# Patient Record
Sex: Female | Born: 1980 | Race: Black or African American | Hispanic: No | Marital: Single | Smoking: Never smoker
Health system: Southern US, Community
[De-identification: ages and names within clinical notes are randomized; demographics above are authoritative.]

## PROBLEM LIST (undated history)

## (undated) DIAGNOSIS — F329 Major depressive disorder, single episode, unspecified: Secondary | ICD-10-CM

## (undated) DIAGNOSIS — I10 Essential (primary) hypertension: Secondary | ICD-10-CM

## (undated) DIAGNOSIS — E119 Type 2 diabetes mellitus without complications: Secondary | ICD-10-CM

## (undated) DIAGNOSIS — K219 Gastro-esophageal reflux disease without esophagitis: Secondary | ICD-10-CM

## (undated) DIAGNOSIS — D649 Anemia, unspecified: Secondary | ICD-10-CM

## (undated) DIAGNOSIS — R112 Nausea with vomiting, unspecified: Secondary | ICD-10-CM

## (undated) DIAGNOSIS — F32A Depression, unspecified: Secondary | ICD-10-CM

## (undated) DIAGNOSIS — Z9889 Other specified postprocedural states: Secondary | ICD-10-CM

## (undated) DIAGNOSIS — Z86018 Personal history of other benign neoplasm: Secondary | ICD-10-CM

## (undated) DIAGNOSIS — J45909 Unspecified asthma, uncomplicated: Secondary | ICD-10-CM

## (undated) DIAGNOSIS — R011 Cardiac murmur, unspecified: Secondary | ICD-10-CM

## (undated) DIAGNOSIS — Z8719 Personal history of other diseases of the digestive system: Secondary | ICD-10-CM

## (undated) DIAGNOSIS — T8859XA Other complications of anesthesia, initial encounter: Secondary | ICD-10-CM

## (undated) DIAGNOSIS — T4145XA Adverse effect of unspecified anesthetic, initial encounter: Secondary | ICD-10-CM

## (undated) DIAGNOSIS — K760 Fatty (change of) liver, not elsewhere classified: Secondary | ICD-10-CM

## (undated) DIAGNOSIS — G43909 Migraine, unspecified, not intractable, without status migrainosus: Secondary | ICD-10-CM

## (undated) DIAGNOSIS — K589 Irritable bowel syndrome without diarrhea: Secondary | ICD-10-CM

## (undated) HISTORY — DX: Type 2 diabetes mellitus without complications: E11.9

## (undated) HISTORY — DX: Gastro-esophageal reflux disease without esophagitis: K21.9

## (undated) HISTORY — DX: Anemia, unspecified: D64.9

## (undated) HISTORY — DX: Essential (primary) hypertension: I10

## (undated) HISTORY — DX: Migraine, unspecified, not intractable, without status migrainosus: G43.909

## (undated) HISTORY — DX: Personal history of other benign neoplasm: Z86.018

## (undated) HISTORY — DX: Personal history of other benign neoplasm: Z98.890

## (undated) HISTORY — DX: Major depressive disorder, single episode, unspecified: F32.9

## (undated) HISTORY — DX: Depression, unspecified: F32.A

## (undated) HISTORY — DX: Irritable bowel syndrome, unspecified: K58.9

---

## 2003-11-25 HISTORY — PX: CHOLECYSTECTOMY: SHX55

## 2004-08-29 ENCOUNTER — Ambulatory Visit: Payer: Self-pay | Admitting: Surgery

## 2004-11-24 HISTORY — PX: TUMOR REMOVAL: SHX12

## 2005-01-29 ENCOUNTER — Emergency Department: Payer: Self-pay | Admitting: Emergency Medicine

## 2005-01-30 ENCOUNTER — Other Ambulatory Visit: Payer: Self-pay

## 2005-02-14 ENCOUNTER — Ambulatory Visit: Payer: Self-pay | Admitting: Gastroenterology

## 2005-04-07 ENCOUNTER — Inpatient Hospital Stay: Payer: Self-pay | Admitting: Obstetrics and Gynecology

## 2006-05-14 ENCOUNTER — Ambulatory Visit: Payer: Self-pay | Admitting: Internal Medicine

## 2008-10-25 ENCOUNTER — Ambulatory Visit: Payer: Self-pay | Admitting: Internal Medicine

## 2009-05-21 ENCOUNTER — Ambulatory Visit: Payer: Self-pay | Admitting: Gastroenterology

## 2011-02-20 ENCOUNTER — Ambulatory Visit: Payer: Self-pay | Admitting: Medical

## 2011-02-23 ENCOUNTER — Ambulatory Visit: Payer: Self-pay | Admitting: Medical

## 2011-04-03 ENCOUNTER — Ambulatory Visit: Payer: Self-pay | Admitting: Medical

## 2011-04-09 ENCOUNTER — Ambulatory Visit: Payer: Self-pay | Admitting: Internal Medicine

## 2011-04-25 ENCOUNTER — Ambulatory Visit: Payer: Self-pay | Admitting: Medical

## 2011-12-09 ENCOUNTER — Ambulatory Visit: Payer: Self-pay | Admitting: Otolaryngology

## 2011-12-09 LAB — CREATININE, SERUM
Creatinine: 0.81 mg/dL (ref 0.60–1.30)
EGFR (African American): >60
EGFR (Non-African Amer.): >60

## 2011-12-09 LAB — HCG, QUANTITATIVE, PREGNANCY: Beta Hcg, Quant.: <1 m[IU]/mL — ABNORMAL LOW

## 2012-07-20 LAB — HM DIABETES EYE EXAM

## 2012-08-26 ENCOUNTER — Telehealth: Payer: Self-pay | Admitting: Internal Medicine

## 2012-08-26 ENCOUNTER — Emergency Department: Payer: Self-pay | Admitting: Emergency Medicine

## 2012-08-26 LAB — COMPREHENSIVE METABOLIC PANEL
Albumin: 3.9 g/dL (ref 3.4–5.0)
Alkaline Phosphatase: 124 U/L (ref 50–136)
Anion Gap: 9 (ref 7–16)
BUN: 14 mg/dL (ref 7–18)
Bilirubin,Total: 0.4 mg/dL (ref 0.2–1.0)
Calcium, Total: 9.4 mg/dL (ref 8.5–10.1)
Chloride: 104 mmol/L (ref 98–107)
Co2: 26 mmol/L (ref 21–32)
Creatinine: 1.1 mg/dL (ref 0.60–1.30)
EGFR (African American): >60
EGFR (Non-African Amer.): >60
Glucose: 149 mg/dL — ABNORMAL HIGH (ref 65–99)
Osmolality: 281 (ref 275–301)
Potassium: 3.8 mmol/L (ref 3.5–5.1)
SGOT(AST): 24 U/L (ref 15–37)
SGPT (ALT): 33 U/L (ref 12–78)
Sodium: 139 mmol/L (ref 136–145)
Total Protein: 8.1 g/dL (ref 6.4–8.2)

## 2012-08-26 LAB — URINALYSIS, COMPLETE
Bacteria: NONE SEEN
Bilirubin,UR: NEGATIVE
Blood: NEGATIVE
Glucose,UR: NEGATIVE mg/dL (ref 0–75)
Ketone: NEGATIVE
Leukocyte Esterase: NEGATIVE
Nitrite: NEGATIVE
Ph: 6 (ref 4.5–8.0)
Protein: NEGATIVE
RBC,UR: NONE SEEN /HPF (ref 0–5)
Specific Gravity: 1.013 (ref 1.003–1.030)
Squamous Epithelial: 2
WBC UR: 2 /HPF (ref 0–5)

## 2012-08-26 LAB — CBC
HCT: 41.4 % (ref 35.0–47.0)
HGB: 13.2 g/dL (ref 12.0–16.0)
MCH: 22.6 pg — ABNORMAL LOW (ref 26.0–34.0)
MCHC: 31.9 g/dL — ABNORMAL LOW (ref 32.0–36.0)
MCV: 71 fL — ABNORMAL LOW (ref 80–100)
Platelet: 268 10*3/uL (ref 150–440)
RBC: 5.84 10*6/uL — ABNORMAL HIGH (ref 3.80–5.20)
RDW: 14.4 % (ref 11.5–14.5)
WBC: 7.2 10*3/uL (ref 3.6–11.0)

## 2012-08-26 LAB — PREGNANCY, URINE: Pregnancy Test, Urine: NEGATIVE m[IU]/mL

## 2012-08-26 NOTE — Telephone Encounter (Signed)
Patient informed and voiced understanding

## 2012-08-26 NOTE — Telephone Encounter (Signed)
If lightheaded and not feeling right (and with the previous issue with recent elevated bp) - rec evaluation today.  (can go to acute care of if new acute sx - to er).  Thanks.

## 2012-08-26 NOTE — Telephone Encounter (Signed)
Pt's says her face has been tingling and going numb. She went to Urgent Care on Tuesday because her B/P was really high. She says that she is still currently not feeling right very light headed. She was wondering what she should do.

## 2012-09-06 ENCOUNTER — Ambulatory Visit (INDEPENDENT_AMBULATORY_CARE_PROVIDER_SITE_OTHER): Payer: 59 | Admitting: Internal Medicine

## 2012-09-06 ENCOUNTER — Encounter: Payer: Self-pay | Admitting: Internal Medicine

## 2012-09-06 ENCOUNTER — Ambulatory Visit: Payer: Self-pay | Admitting: Internal Medicine

## 2012-09-06 VITALS — BP 122/82 | HR 82 | Temp 98.5°F | Ht 61.0 in | Wt 274.8 lb

## 2012-09-06 DIAGNOSIS — Z23 Encounter for immunization: Secondary | ICD-10-CM

## 2012-09-06 DIAGNOSIS — I10 Essential (primary) hypertension: Secondary | ICD-10-CM

## 2012-09-06 DIAGNOSIS — M25519 Pain in unspecified shoulder: Secondary | ICD-10-CM

## 2012-09-06 DIAGNOSIS — K219 Gastro-esophageal reflux disease without esophagitis: Secondary | ICD-10-CM | POA: Insufficient documentation

## 2012-09-06 DIAGNOSIS — E119 Type 2 diabetes mellitus without complications: Secondary | ICD-10-CM

## 2012-09-06 MED ORDER — SITAGLIPTIN PHOS-METFORMIN HCL 50-1000 MG PO TABS: 1.0000 | ORAL_TABLET | Freq: Every day | ORAL | Status: DC

## 2012-09-06 NOTE — Assessment & Plan Note (Signed)
Blood pressure on recheck 128/88.  A little elevated.  Have her spot check her sugars and send in for review.  Follow.  Hold on making changes in her meds at this time.

## 2012-09-06 NOTE — Assessment & Plan Note (Signed)
States her sugars have been doing well.  Obtain recent labs for review.  Same meds.  Follow.

## 2012-09-06 NOTE — Patient Instructions (Addendum)
It was good seeing you today.  I am sorry you have not been feeling well.  I will get your records to review.  Also, I am going to refer you to physical therapy for your shoulder.

## 2012-09-06 NOTE — Progress Notes (Signed)
Subjective:    Patient ID: Shirley [name], female    DOB: [birth year], 31 y.o.   MRN: [id number]  HPI 31 year old female with past history or hypertension and hypercholesterolemia who comes in today for a scheduled follow up.  She reports she had been doing relatively well up until approximately two weeks ago.  She woke and felt a little "sluggish".  Face was tingling.  A friend noticed her face was red.  Went to work for approximately one hour - felt no better.  To [facility].  Evaluated by Dr Lysle Dingwall.  Blood sugar - 125.  Hgb slightly decreased.  No other significant abnormality.  Blood pressure was a little elevated.  Remained out of work for the next two days.  Had one episode of emesis during this time.  The following day (when she woke) - she didn't feel good.  Was a little light headed.  Stayed at work for 1/2 day and started feeling better.  She walked up town (from her work) and was sitting.  Started to feel "funny" again.  Had not eaten anything since breakfast (was after 1:00).  Stomach was upset.  Ate soda and funnel cake fries.  Symptoms improved for a while and then she started feeling sob and light headed.  To ER.  Labs, xray and EKG - normal (per report).  Blood pressure was elevated initially, but down to normal prior to leaving.  States she was told she was dehydrated.  She was initially fatigued, but after a couple of days felt back to her normal self.  No symptoms or problems now.  She feels the above episode was related to starting Topamax.  She had recently started this for migraine prevention.  Has since stopped - and feels better.   Prior to the above episode, she had been having some right shoulder pain.  Hurts to raise her arm.  Was also evaluated for this at [facility].  Was given Etodolac.  Shoulder is better, but still hurts when she fully extends her arm and with certain movements.  Past Medical History  Diagnosis Date  . History of chicken pox   . Depression   . Diabetes   . Migraine   .  Hypertension     Review of Systems Patient denies any headache, lightheadedness or dizziness currently.  No headache.  No chest pain, tightness or palpitations.  No increased shortness of breath, cough or congestion.  No acid reflux.  No nausea or vomiting.  No abdominal pain or cramping.  No bowel change, such as diarrhea, constipation, BRBPR or melana.  No urine change.        Objective:   Physical Exam Filed Vitals:   09/06/12 1344  BP: 122/82  Pulse: 82  Temp: 98.5 F (87.64 C)   31 year old female in no acute distress.   HEENT:  Nares - clear.  OP- without lesions or erythema.  NECK:  Supple, nontender.  No audible carotid bruit.   HEART:  Appears to be regular. LUNGS:  Without crackles or wheezing audible.  Respirations even and unlabored.   RADIAL PULSE:  Equal bilaterally.  ABDOMEN:  Soft, nontender.  No audible abdominal bruit.   EXTREMITIES:  No increased edema to be present.              MSK.  Increased pain - shoulder - noted more with full extension of her arm over her head.        Assessment & Plan:  TINGLING (  FACE) AND SLUGGISH FEELING.  See above.  Has resolved now.  She is eating and drinking well.  No nausea or vomiting.  No headache.  Blood pressure is better.  Off Tomapax.  Will obtain records from her [facility] visit and her ER visit.  Hold on further testing at this time.  Follow.  Could have been a possible reaction from the Topamax or could have had some viral illness.  Currently asymptomatic.  Follow.  SHOULDER PAIN.  See above.  Better after taking the Etodolac.  Will try stopping the Etodolac.  Refer to PT for evaluation and treatment.  Hold on further testing at this point.

## 2012-09-06 NOTE — Assessment & Plan Note (Signed)
Symptoms controlled on Protonix.  Follow.    

## 2012-09-21 ENCOUNTER — Telehealth: Payer: Self-pay | Admitting: Internal Medicine

## 2012-09-21 NOTE — Telephone Encounter (Signed)
Pt needs new activation code for my chart

## 2012-10-07 ENCOUNTER — Encounter: Payer: Self-pay | Admitting: Internal Medicine

## 2012-10-07 ENCOUNTER — Ambulatory Visit (INDEPENDENT_AMBULATORY_CARE_PROVIDER_SITE_OTHER): Payer: 59 | Admitting: Internal Medicine

## 2012-10-07 VITALS — BP 110/70 | HR 99 | Temp 98.3°F | Ht 61.0 in | Wt 276.2 lb

## 2012-10-07 DIAGNOSIS — K219 Gastro-esophageal reflux disease without esophagitis: Secondary | ICD-10-CM

## 2012-10-07 DIAGNOSIS — E559 Vitamin D deficiency, unspecified: Secondary | ICD-10-CM

## 2012-10-07 DIAGNOSIS — I10 Essential (primary) hypertension: Secondary | ICD-10-CM

## 2012-10-07 DIAGNOSIS — E119 Type 2 diabetes mellitus without complications: Secondary | ICD-10-CM

## 2012-10-07 MED ORDER — MOMETASONE FUROATE 50 MCG/ACT NA SUSP: 2.0000 | Freq: Every day | NASAL | Status: DC | PRN

## 2012-10-07 MED ORDER — AMOXICILLIN 875 MG PO TABS: 875.0000 mg | ORAL_TABLET | Freq: Two times a day (BID) | ORAL | Status: DC

## 2012-10-07 NOTE — Patient Instructions (Signed)
It was good to see you again.  I want you to do the saline flushes and nasonex as we discussed.  I am going to give you a prescription to have if needed for an antibiotic.  Let me know if problems.  Take protonix twice a day.

## 2012-10-09 ENCOUNTER — Encounter: Payer: Self-pay | Admitting: Internal Medicine

## 2012-10-09 DIAGNOSIS — E559 Vitamin D deficiency, unspecified: Secondary | ICD-10-CM | POA: Insufficient documentation

## 2012-10-09 NOTE — Assessment & Plan Note (Signed)
Blood pressure ok.  Follow.  Same meds.  Check metabolic panel.

## 2012-10-09 NOTE — Assessment & Plan Note (Signed)
Controlled.  Follow.   

## 2012-10-09 NOTE — Progress Notes (Signed)
  Subjective:    Patient ID: Shirley [name], female    DOB: 24-Jul-1981, 31 y.o.   MRN: [id number]  HPI 31 year old female with past history of hypertension, diabetes and GERD who comes in today for a scheduled follow up.  She states she has been dong better.  Going to physical therapy.  Helped her shoulder.  Cannot afford to continue.  Instructed to do her exercises at home.  She is now having a scratchy throat and increased nasal congestion.  Yellow mucus production.  Sore throat.  No fever or cough.  No sob.  No chest pain or tightness.    Past Medical History  Diagnosis Date  . Depression   . Diabetes mellitus   . Migraine   . Hypertension   . IBS (irritable bowel syndrome)   . GERD (gastroesophageal reflux disease)   . Anemia     Review of Systems Patient denies any headache, lightheadedness or dizziness.  Sinus symptoms as outlined.   No chest pain, tightness or palpitations.  No increased shortness of breath, cough or congestion.  No nausea or vomiting.  No abdominal pain or cramping.  No increased acid reflux.  No bowel change, such as diarrhea, constipation, BRBPR or melana.  No urine change.        Objective:   Physical Exam Filed Vitals:   10/07/12 1512  BP: 110/70  Pulse: 99  Temp: 98.3 F (34.73 C)   31 year old female in no acute distress.   HEENT:  Nares - clear except slightly erythematous turbinates.  OP- without lesions or erythema.  TMs visualized - without erythema.  Minimal sinus tenderness to palpation.   NECK:  Supple, nontender.  No audible bruit.   HEART:  Appears to be regular. LUNGS:  Without crackles or wheezing audible.  Respirations even and unlabored.   RADIAL PULSE:  Equal bilaterally.  ABDOMEN:  Soft, nontender.  No audible abdominal bruit.   EXTREMITIES:  No increased edema to be present.                     Assessment & Plan:  POSSIBLE EARLY SINUSITIS.  Will restart Nasonex.  Saline nasal flushes as directed.  Robitussin as directed.  Gave her  a rx for Amoxicillin to hang on to.  Only fill if symptoms progress.  Notify me if persistent problems.    INCREASED PSYCHOSOCIAL STRESSORS.  Handling things relatively well.  Follow.    HEALTH MAINTENANCE.  Physical 03/27/12.  Breast/pap and pelvic through GYN.  Cholesterol 5/13 wnl.

## 2012-10-09 NOTE — Assessment & Plan Note (Signed)
Continue replacement.  Follow.   

## 2012-10-09 NOTE — Assessment & Plan Note (Signed)
Taking Janumet.  Follow sugars bid.  Record.  Diabetic diet and exercise.

## 2012-10-12 ENCOUNTER — Encounter: Payer: Self-pay | Admitting: Internal Medicine

## 2012-10-13 ENCOUNTER — Telehealth: Payer: Self-pay | Admitting: *Deleted

## 2012-10-13 NOTE — Telephone Encounter (Signed)
Left message for patient to return call.

## 2012-10-15 NOTE — Telephone Encounter (Signed)
Form completed and sent to [facility]

## 2012-10-22 NOTE — Telephone Encounter (Signed)
Opened by mistake.

## 2012-10-25 ENCOUNTER — Encounter: Payer: Self-pay | Admitting: Internal Medicine

## 2012-10-28 NOTE — Telephone Encounter (Signed)
Patient got this when she saw Dr. Lorin Picket on 10/07/12.

## 2012-11-09 ENCOUNTER — Telehealth: Payer: Self-pay | Admitting: Internal Medicine

## 2012-11-09 ENCOUNTER — Encounter: Payer: Self-pay | Admitting: Internal Medicine

## 2012-11-09 NOTE — Telephone Encounter (Signed)
Pt notified of labs via my chart.  

## 2012-11-09 NOTE — Telephone Encounter (Signed)
Pt states she had labs at [facility] on 09/01/12.  We have not received these.  i can't find them.  Please call [facility] and get them to send me a copy of labs.

## 2012-11-09 NOTE — Telephone Encounter (Signed)
Lab results from [facility] given to Dr.

## 2012-11-09 NOTE — Telephone Encounter (Signed)
Do you know where you put the copy of her labs.  I gave them to you and told you she had been notified.  Apparently she is needing the exact values.  I looked in the scan pile and could not find.  I may have missed them.  Pt needs values by this pm.  Thanks.  Let me know if a problem.

## 2012-11-10 NOTE — Telephone Encounter (Signed)
Received labs, called patient to let her know her values. Left message to return call.

## 2012-12-01 ENCOUNTER — Encounter: Payer: Self-pay | Admitting: Internal Medicine

## 2012-12-07 ENCOUNTER — Encounter: Payer: Self-pay | Admitting: Internal Medicine

## 2012-12-07 ENCOUNTER — Ambulatory Visit (INDEPENDENT_AMBULATORY_CARE_PROVIDER_SITE_OTHER): Payer: 59 | Admitting: Internal Medicine

## 2012-12-07 VITALS — BP 140/90 | HR 80 | Temp 98.6°F | Ht 61.0 in | Wt 276.2 lb

## 2012-12-07 DIAGNOSIS — I10 Essential (primary) hypertension: Secondary | ICD-10-CM

## 2012-12-07 DIAGNOSIS — E119 Type 2 diabetes mellitus without complications: Secondary | ICD-10-CM

## 2012-12-07 DIAGNOSIS — K219 Gastro-esophageal reflux disease without esophagitis: Secondary | ICD-10-CM

## 2012-12-12 ENCOUNTER — Encounter: Payer: Self-pay | Admitting: Internal Medicine

## 2012-12-12 NOTE — Assessment & Plan Note (Signed)
Blood pressure borderline today.  Have her spot check her pressure.  Follow.  Will need adjustment in her medication if persistent elevation.

## 2012-12-12 NOTE — Assessment & Plan Note (Signed)
Last a1c checked 6.7.  Diet and exercise.  Follow.  Weight loss.

## 2012-12-12 NOTE — Progress Notes (Signed)
  Subjective:    Patient ID: Shirley [name], female    DOB: 1981-05-20, 32 y.o.   MRN: [id number]  HPI 32 year old female with past history of hypertension, diabetes and GERD who comes in today for a scheduled follow up.  She states she has been dong better.  Physical therapy helped her shoulder.   Instructed to do her exercises at home.  States her sugar in the am has been running 130-140.  No recorded evening sugars.  Still seeing Dr Sherryll Burger for her headaches, etc.  Breathing stable.  No chest pain or tightness.     Past Medical History  Diagnosis Date  . Depression   . Diabetes mellitus   . Migraine   . Hypertension   . IBS (irritable bowel syndrome)   . GERD (gastroesophageal reflux disease)   . Anemia     Review of Systems Patient denies any headache, lightheadedness or dizziness.  No significant sinus or allergy symptoms currently.   No chest pain, tightness or palpitations.  No increased shortness of breath, cough or congestion.  No nausea or vomiting.  No abdominal pain or cramping.  No increased acid reflux.  No bowel change, such as diarrhea, constipation, BRBPR or melana.  No urine change.        Objective:   Physical Exam  Filed Vitals:   12/07/12 1516  BP: 140/90  Pulse: 80  Temp: 98.6 F (47 C)   32 year old female in no acute distress.   HEENT:  Nares - clear.   OP- without lesions or erythema.   NECK:  Supple, nontender.  No audible bruit.   HEART:  Appears to be regular. LUNGS:  Without crackles or wheezing audible.  Respirations even and unlabored.   RADIAL PULSE:  Equal bilaterally.  ABDOMEN:  Soft, nontender.  No audible abdominal bruit.   EXTREMITIES:  No increased edema to be present.                     Assessment & Plan:  INCREASED PSYCHOSOCIAL STRESSORS.  Handling things relatively well.  Follow.      HEALTH MAINTENANCE.  Physical 03/27/12.  Breast/pap and pelvic through GYN.  Cholesterol 5/13 wnl.

## 2012-12-12 NOTE — Assessment & Plan Note (Signed)
Symptoms controlled

## 2012-12-27 ENCOUNTER — Telehealth: Payer: Self-pay | Admitting: Internal Medicine

## 2012-12-27 NOTE — Telephone Encounter (Signed)
Left message for pt to call office to schedule her appointment     My chart message   Appointment Request From: Irving Copas      With Provider: Charm Barges, MD [-Primary Care Physician-]      Preferred Date Range: Any date 12/27/2012 or later      Preferred Times: Any      Reason for visit: Office Visit      Comments:   Good Morning,    I am having server right arm pains. The main source of pain is coming from the elbow area. I am unable to used it due the pain coming from the elbow. Hoping I may have appointment for today to discuss some sort of pain meds or muscle relaxer.       Thanks   Irving Copas

## 2012-12-28 NOTE — Telephone Encounter (Signed)
Just an FYI

## 2012-12-28 NOTE — Telephone Encounter (Signed)
Appointment with raqual 2/5 @ 1:30

## 2012-12-28 NOTE — Telephone Encounter (Signed)
Left message for pt to call office and also sent my chart message for pt to call office

## 2012-12-28 NOTE — Telephone Encounter (Signed)
Patient Information:  Caller Name: Sheletha  Phone: [phone]  Patient: Shirley [name], Shirley [name]  Gender: Female  DOB: 1981-08-04  Age: 32 Years  PCP: Dale Rosedale  Pregnant: No  Office Follow Up:  Does the office need to follow up with this patient?: Yes  Instructions For The Office: No available appointments this date; some are blocked.  Please follow up with patient regarding possible work in appointment today.  She is at work and can be reached at number provided; it is the number to her desk.   Symptoms  Reason For Call & Symptoms: Right arm pain primarily in elbow area.  Pain rated at 7-8 of 10.  Unsure of injury.  Worse since 12/25/12.  Numbness in hand reported.  Denies swelling.  Reviewed Health History In EMR: Yes  Reviewed Medications In EMR: Yes  Reviewed Allergies In EMR: Yes  Reviewed Surgeries / Procedures: Yes  Date of Onset of Symptoms: 12/14/2012  Treatments Tried: Tylenol, Flexeril - some relief  Treatments Tried Worked: No OB / GYN:  LMP: 11/26/2012  Guideline(s) Used:  Elbow Pain  Disposition Per Guideline:   See Today in Office  Reason For Disposition Reached:   Numbness (i.e., loss of sensation) in hand or fingers  Advice Given:  Reassurance - Elbow Pain  Causes of elbow pain can include a strained muscle, a forgotten minor injury, and tendinitis.  Here is some care advice that should help.  Pain Medicines:  For pain relief, you can take either acetaminophen, ibuprofen, or naproxen.  Acetaminophen (e.g., Tylenol):  Regular Strength Tylenol: Take 650 mg (two 325 mg pills) by mouth every 4-6 hours as needed. Each Regular Strength Tylenol pill has 325 mg of acetaminophen.  Extra Strength Tylenol: Take 1,000 mg (two 500 mg pills) every 8 hours as needed. Each Extra Strength Tylenol pill has 500 mg of acetaminophen.  The most you should take each day is 3,000 mg (10 Regular Strength or 6 Extra Strength pills a day).  Extra Notes :  Use the lowest amount of  medicine that makes your pain feel better.  Call Back If:  Swollen joint or fever occurs  You become worse.  Apply Heat to the Area:  Beginning 48 hours after an injury, apply a warm washcloth or heating pad for 10 minutes three times a day.  This will help increase blood flow and improve healing.

## 2012-12-29 ENCOUNTER — Encounter: Payer: Self-pay | Admitting: Adult Health

## 2012-12-29 ENCOUNTER — Ambulatory Visit (INDEPENDENT_AMBULATORY_CARE_PROVIDER_SITE_OTHER): Payer: 59 | Admitting: Adult Health

## 2012-12-29 VITALS — BP 130/80 | HR 100 | Temp 98.2°F | Resp 16 | Wt 278.0 lb

## 2012-12-29 DIAGNOSIS — M25529 Pain in unspecified elbow: Secondary | ICD-10-CM | POA: Insufficient documentation

## 2012-12-29 MED ORDER — TRAMADOL HCL 50 MG PO TABS: 50.0000 mg | ORAL_TABLET | Freq: Three times a day (TID) | ORAL | Status: DC | PRN

## 2012-12-29 MED ORDER — ELBOW BRACE MISC: Status: DC

## 2012-12-29 MED ORDER — CYCLOBENZAPRINE HCL 10 MG PO TABS: 10.0000 mg | ORAL_TABLET | Freq: Three times a day (TID) | ORAL | Status: DC | PRN

## 2012-12-29 NOTE — Progress Notes (Signed)
  Subjective:    Patient ID: Shirley [name], female    DOB: Mar 08, 1981, 32 y.o.   MRN: [id number]  HPI  Patient presents to clinic today with c/o pain in right elbow pain with some radiation up to the shoulder area. She first noticed it around the beginning of January while she was helping family move. She also bowls in a league and this has been difficult for her to do. She has decreased the weight of the bowling ball to less than 10 lbs but this is still bothering her. Occasional tingling sensation in the right arm. She denies coolness in extremity or change in color.   Review of Systems  Respiratory: Negative.   Cardiovascular: Negative.   Musculoskeletal:       Pain in right elbow on lateral aspect. Some radiation to shoulder  Neurological: Negative for weakness and numbness.       Occassional tingling in right arm.  Psychiatric/Behavioral: Negative.    BP 130/80  Pulse 100  Temp 98.2 F (36.8 C) (Oral)  Resp 16  Wt 278 lb (126.1 kg)  SpO2 98%  LMP 12/27/2012     Objective:   Physical Exam  Constitutional: She is oriented to person, place, and time.  Musculoskeletal: Normal range of motion. She exhibits edema and tenderness.  Neurological: She is alert and oriented to person, place, and time. Coordination normal.  Skin: Skin is warm and dry.  Psychiatric: She has a normal mood and affect. Her behavior is normal. Thought content normal.       Assessment & Plan:

## 2012-12-29 NOTE — Assessment & Plan Note (Addendum)
Pain in lateral aspect of elbow. Full ROM without any crepitus. Suspect this is related to overuse injury from recently helping someone move and lifting heavy objects. 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 Will try flexeril and tramadol. Apply ice for 20 min alternating with heat. If no improvement will refer to ortho. She should not bowl for several weeks to give this time to heal. An elbow brace may also provide some support. Ordered same.

## 2012-12-29 NOTE — Patient Instructions (Addendum)
  Apply ice alternating with heat to the area for 20 min. Do this 3-4 times a day if possible.  Take flexeril 3 times a day as needed for muscle spasms.  Take Tramadol 3 times a day for pain.  You can still take some Aleve if you need it. Do not take more than 2 daily.  Injury to the musculoskeletal system takes approximately 4-6 weeks to heel. You should start to feel some relief within this time frame.  Avoid any activity that may aggravate your discomfort. Do not bowl for several weeks.  Do not lift anything above 5 lbs with your right arm.  You might also feel some relief with an elbow support that is sold over the counter.

## 2013-01-19 ENCOUNTER — Telehealth: Payer: Self-pay | Admitting: *Deleted

## 2013-01-19 NOTE — Telephone Encounter (Signed)
Left message for patient to return call concerning her lab results.

## 2013-01-20 NOTE — Telephone Encounter (Signed)
Patient called back for lab results. Patient notified.

## 2013-02-04 ENCOUNTER — Encounter: Payer: Self-pay | Admitting: Internal Medicine

## 2013-02-04 ENCOUNTER — Ambulatory Visit (INDEPENDENT_AMBULATORY_CARE_PROVIDER_SITE_OTHER): Payer: 59 | Admitting: Internal Medicine

## 2013-02-04 VITALS — BP 124/90 | HR 88 | Temp 98.4°F | Ht 61.0 in | Wt 276.5 lb

## 2013-02-04 DIAGNOSIS — M79609 Pain in unspecified limb: Secondary | ICD-10-CM

## 2013-02-04 DIAGNOSIS — E559 Vitamin D deficiency, unspecified: Secondary | ICD-10-CM

## 2013-02-04 DIAGNOSIS — K219 Gastro-esophageal reflux disease without esophagitis: Secondary | ICD-10-CM

## 2013-02-04 DIAGNOSIS — I10 Essential (primary) hypertension: Secondary | ICD-10-CM

## 2013-02-04 DIAGNOSIS — M79601 Pain in right arm: Secondary | ICD-10-CM

## 2013-02-04 DIAGNOSIS — E119 Type 2 diabetes mellitus without complications: Secondary | ICD-10-CM

## 2013-02-04 MED ORDER — LOSARTAN POTASSIUM-HCTZ 100-12.5 MG PO TABS: 1.0000 | ORAL_TABLET | Freq: Every day | ORAL | Status: DC

## 2013-02-06 ENCOUNTER — Encounter: Payer: Self-pay | Admitting: Internal Medicine

## 2013-02-06 NOTE — Assessment & Plan Note (Signed)
Blood pressure elevated.  Will increased losartan/hctz to 100/12.5 q day.  Follow.

## 2013-02-06 NOTE — Progress Notes (Signed)
  Subjective:    Patient ID: Shirley [name], female    DOB: [birth year], 32 y.o.   MRN: [id number]  HPI 32 year old female with past history of hypertension, diabetes and GERD who comes in today for a scheduled follow up.  She states she has been doing relatively well.  Physical therapy helped her shoulder.  She has now been having increased pain in her right elbow.  Hurts to rotate her arm forward.  She did stop bowling for a while.   States her sugar has been doing better.  No recorded sugar readings. Breathing stable.  No chest pain or tightness.   Seeing GYN.  Just evaluated 2/14 and had left lower quadrant pain.  Had a pelvic ultrasound.  Obtain records.  Has follow up planned in 6/14 for a follow up ultrasound.  Still seeing Dr [place] Callas.  Receiving allergy shots and taking an antihistamine, using saline nasal spray and a rx nasal spray.  Still some allergy issues.  On protonix.  Past Medical History  Diagnosis Date  . Depression   . Diabetes mellitus   . Migraine   . Hypertension   . IBS (irritable bowel syndrome)   . GERD (gastroesophageal reflux disease)   . Anemia     Review of Systems Patient denies any headache, lightheadedness or dizziness.  Some allergy symptoms.  Question if aggravated by reflux.  Some reflux.  On protonix daily.  No chest pain, tightness or palpitations.  No increased shortness of breath, cough or congestion.  No nausea or vomiting.  No abdominal pain or cramping.  Being followed by gyn for left lower quadrant pain.  No bowel change, such as diarrhea, constipation, BRBPR or melana.  No urine change.        Objective:   Physical Exam  Filed Vitals:   02/04/13 1530  BP: 124/90  Pulse: 88  Temp: 98.4 F (84.58 C)   32 year old female in no acute distress.   HEENT:  Nares - clear.   OP- without lesions or erythema.   NECK:  Supple, nontender.  No audible bruit.   HEART:  Appears to be regular. LUNGS:  Without crackles or wheezing audible.  Respirations even  and unlabored.   RADIAL PULSE:  Equal bilaterally.  ABDOMEN:  Soft, nontender.  No audible abdominal bruit.   EXTREMITIES:  No increased edema to be present.  MSK:  Pain with palpation over the elbow.  Increased pain with rotation of her right forearm.                     Assessment & Plan:  INCREASED PSYCHOSOCIAL STRESSORS.  Handling things relatively well.  Follow.      HEALTH MAINTENANCE.  Physical 03/27/12.  Breast/pap and pelvic through GYN.  Cholesterol 5/13 wnl.

## 2013-02-06 NOTE — Assessment & Plan Note (Signed)
With persistent allergy issues and some reflux, will increase protonix to 40mg  bid.  Follow.

## 2013-02-06 NOTE — Assessment & Plan Note (Signed)
Continue supplementation.  Follow.  

## 2013-02-06 NOTE — Assessment & Plan Note (Signed)
Appears to be c/w tendonitis.  Saw Raquel.  Has been taking a muscle relaxant and pain meds.  Appears to be more c/w tendonitis.  Elbow strap.  Avoid increased antiinflammatories.  Refer to Dr Lavenia Atlas for evaluation and question of need for an injection.

## 2013-02-06 NOTE — Assessment & Plan Note (Signed)
A1c just checked 01/11/13 - 6.7.  Follow.  Diabetic diet and exercise.

## 2013-02-07 ENCOUNTER — Telehealth: Payer: Self-pay | Admitting: Emergency Medicine

## 2013-02-12 ENCOUNTER — Other Ambulatory Visit: Payer: Self-pay | Admitting: Adult Health

## 2013-03-02 ENCOUNTER — Other Ambulatory Visit: Payer: Self-pay | Admitting: General Practice

## 2013-03-02 MED ORDER — TRAMADOL HCL 50 MG PO TABS: 50.0000 mg | ORAL_TABLET | Freq: Three times a day (TID) | ORAL | Status: DC | PRN

## 2013-03-15 ENCOUNTER — Ambulatory Visit (INDEPENDENT_AMBULATORY_CARE_PROVIDER_SITE_OTHER): Payer: 59 | Admitting: Internal Medicine

## 2013-03-15 VITALS — BP 110/70 | HR 89 | Temp 98.6°F | Ht 61.0 in | Wt 268.2 lb

## 2013-03-15 DIAGNOSIS — K219 Gastro-esophageal reflux disease without esophagitis: Secondary | ICD-10-CM

## 2013-03-15 DIAGNOSIS — M25521 Pain in right elbow: Secondary | ICD-10-CM

## 2013-03-15 DIAGNOSIS — I10 Essential (primary) hypertension: Secondary | ICD-10-CM

## 2013-03-15 DIAGNOSIS — M25529 Pain in unspecified elbow: Secondary | ICD-10-CM

## 2013-03-15 DIAGNOSIS — E119 Type 2 diabetes mellitus without complications: Secondary | ICD-10-CM

## 2013-03-15 DIAGNOSIS — E559 Vitamin D deficiency, unspecified: Secondary | ICD-10-CM

## 2013-03-15 MED ORDER — CYCLOBENZAPRINE HCL 10 MG PO TABS: 10.0000 mg | ORAL_TABLET | Freq: Two times a day (BID) | ORAL | Status: DC | PRN

## 2013-03-21 ENCOUNTER — Encounter: Payer: Self-pay | Admitting: Internal Medicine

## 2013-03-21 NOTE — Assessment & Plan Note (Signed)
Doing better s/p injection.  Follow.   

## 2013-03-21 NOTE — Assessment & Plan Note (Signed)
Continue supplementation.  Follow.  

## 2013-03-21 NOTE — Progress Notes (Signed)
Subjective:    Patient ID: Shirley [name], female    DOB: 07-Feb-1981, 32 y.o.   MRN: [id number]  HPI 32 year old female with past history of hypertension, diabetes and GERD who comes in today for a scheduled follow up.  She states she has been doing relatively well.  Physical therapy helped her shoulder.  She was having increased pain in her right elbow.  Saw Dr Gavin Potters yesterday.  Had an injection.  Better today.  Still some discomfort in her right shoulder, but better than previous.  Doing her exercise.  States her sugar has been averaging 100-110 in the am and 170s in the pm.  Breathing stable.  No chest pain or tightness.   Seeing GYN.  Just evaluated 2/14 and had left lower quadrant pain.  Had a pelvic ultrasound.  Has follow up planned in 6/14 for a follow up ultrasound.  Still seeing Dr [place] Callas.  Receiving allergy shots and taking an antihistamine, using saline nasal spray and a rx nasal spray.  Still some allergy issues.  On protonix.   Past Medical History  Diagnosis Date  . Depression   . Diabetes mellitus   . Migraine   . Hypertension   . IBS (irritable bowel syndrome)   . GERD (gastroesophageal reflux disease)   . Anemia     Current Outpatient Prescriptions on File Prior to Visit  Medication Sig Dispense Refill  . azelastine (OPTIVAR) 0.05 % ophthalmic solution 1 drop 2 (two) times daily as needed.      . Cholecalciferol (VITAMIN D-3) 1000 UNITS CAPS Take 1 capsule by mouth daily.      . Elastic Bandages & Supports (ELBOW BRACE) MISC Use elbow brace during the day. Remove at bedtime.  1 each  0  . levocetirizine (XYZAL) 5 MG tablet Take 5 mg by mouth every evening.      Marland Kitchen losartan-hydrochlorothiazide (HYZAAR) 100-12.5 MG per tablet Take 1 tablet by mouth daily.  90 tablet  3  . Magnesium Oxide (MAG-OX 400 PO) Take 1 tablet by mouth daily.      . mometasone (NASONEX) 50 MCG/ACT nasal spray Place 2 sprays into the nose daily as needed.  17 g  2  . Olopatadine HCl (PATANASE) 0.6  % SOLN Place into the nose as needed.      . pantoprazole (PROTONIX) 40 MG tablet Take 40 mg by mouth daily.      Marland Kitchen pyridOXINE (VITAMIN B-6) 100 MG tablet Take 100 mg by mouth daily.      . SitaGLIPtin-MetFORMIN HCl (JANUMET XR) 50-1000 MG TB24 Take 1 tablet by mouth daily.      . SUMAtriptan (IMITREX) 50 MG tablet Take 50 mg by mouth daily as needed.      . traMADol (ULTRAM) 50 MG tablet Take 1 tablet (50 mg total) by mouth every 8 (eight) hours as needed for pain.  45 tablet  0  . zolpidem (AMBIEN) 5 MG tablet Take 5 mg by mouth at bedtime as needed.       No current facility-administered medications on file prior to visit.    Review of Systems Patient denies any headache, lightheadedness or dizziness.  Some allergy symptoms.  On protonix daily.  No chest pain, tightness or palpitations.  No increased shortness of breath, cough or congestion.  No nausea or vomiting.  No abdominal pain or cramping.  Being followed by gyn for left lower quadrant pain.  No bowel change, such as diarrhea, constipation, BRBPR or melana.  No  urine change.  Right elbow pain improved after injection yesterday.        Objective:   Physical Exam  Filed Vitals:   03/15/13 1121  BP: 110/70  Pulse: 89  Temp: 98.6 F (7 C)   32 year old female in no acute distress.   HEENT:  Nares - clear.   OP- without lesions or erythema.   NECK:  Supple, nontender.  No audible bruit.   HEART:  Appears to be regular. LUNGS:  Without crackles or wheezing audible.  Respirations even and unlabored.   RADIAL PULSE:  Equal bilaterally.  ABDOMEN:  Soft, nontender.  No audible abdominal bruit.   EXTREMITIES:  No increased edema to be present.  MSK:  Decreased pain to palpation over the right elbow.  No significant pain - right shoulder - with rotation and abduction/adduction - right arm.                     Assessment & Plan:  INCREASED PSYCHOSOCIAL STRESSORS.  Handling things relatively well.  Follow.      HEALTH MAINTENANCE.   Physical 03/27/12.  Breast/pap and pelvic through GYN.  Cholesterol 5/13 wnl.

## 2013-03-21 NOTE — Assessment & Plan Note (Signed)
On protonix.  No reported problems with reflux currently.  Follow.

## 2013-03-21 NOTE — Assessment & Plan Note (Signed)
Blood pressure doing better.  Follow.   

## 2013-03-21 NOTE — Assessment & Plan Note (Signed)
A1c just checked 01/11/13 - 6.7.  Follow.  Diabetic diet and exercise.      

## 2013-04-22 ENCOUNTER — Other Ambulatory Visit: Payer: Self-pay | Admitting: Internal Medicine

## 2013-04-25 ENCOUNTER — Telehealth: Payer: Self-pay | Admitting: Internal Medicine

## 2013-04-25 NOTE — Telephone Encounter (Signed)
Patient got appointment to see Dr. Dan Humphreys tomorrow

## 2013-04-25 NOTE — Telephone Encounter (Signed)
Patient Information:  Caller Name: Shirley [name]  Phone: [phone]  Patient: Shirley [name], Shirley [name]  Gender: Female  DOB: [birth year]  Age: 32 Years  PCP: Dale Tilton  Pregnant: No  Office Follow Up:  Does the office need to follow up with this patient?: No  Instructions For The Office: N/A   Symptoms  Reason For Call & Symptoms: Shirley [name] states his "allergies are acting up". Has had  allergy injection weekly for over one year. Over last month Shirley [name] has developed sneezing, stuffy nose , runny nose with white nasal drainane and productive cough with yellow mucus within a few days after allergy injection. Has no thermometer. Unable to schedule appt with allergist this week. Was advised by allergist to schedule appontment with PCP. Per hat fever protocol has see today or tomorrow due to moderate -severe nasal allergy symptoms and taking antihistamine > 2 days- using nasal spray.  Reviewed Health History In EMR: Yes  Reviewed Medications In EMR: Yes  Reviewed Allergies In EMR: Yes  Reviewed Surgeries / Procedures: Yes  Date of Onset of Symptoms: 04/21/2013  Treatments Tried: Tylenol cold, cough drops  Treatments Tried Worked: No  Any Fever: Yes  Fever Taken: Tactile  Fever Time Of Reading: 13:26:28  Fever Last Reading: N/A OB / GYN:  LMP: Unknown  Guideline(s) Used:  Hay Fever - Nasal Allergies  Disposition Per Guideline:   See Today or Tomorrow in Office  Reason For Disposition Reached:   Moderate-Severe nasal allergy symptoms (i.e., interfere with sleep, school, or work) and taking antihistamines > 2 days  Advice Given:  Wash off Pollen Daily:  Remove pollen from the body with hair washing and a shower, especially before bedtime.  For a Stuffy Nose - Use Nasal Washes:  How it Helps: The salt water rinses out excess mucus, washes out any irritants (dust, allergens) that might be present, and moistens the nasal cavity.  Patient Will Follow Care Advice:  YES  Appointment  Scheduled:  04/26/2013 10:30:44 Appointment Scheduled Provider:  Ronna Polio (Adults only)

## 2013-04-26 ENCOUNTER — Ambulatory Visit (INDEPENDENT_AMBULATORY_CARE_PROVIDER_SITE_OTHER): Payer: 59 | Admitting: Internal Medicine

## 2013-04-26 ENCOUNTER — Encounter: Payer: Self-pay | Admitting: Internal Medicine

## 2013-04-26 ENCOUNTER — Ambulatory Visit: Payer: Self-pay | Admitting: Internal Medicine

## 2013-04-26 VITALS — BP 120/80 | HR 106 | Temp 99.0°F | Ht 61.0 in | Wt 273.8 lb

## 2013-04-26 DIAGNOSIS — I10 Essential (primary) hypertension: Secondary | ICD-10-CM

## 2013-04-26 DIAGNOSIS — E119 Type 2 diabetes mellitus without complications: Secondary | ICD-10-CM

## 2013-04-26 MED ORDER — ALBUTEROL SULFATE HFA 108 (90 BASE) MCG/ACT IN AERS: 2.0000 | INHALATION_SPRAY | Freq: Four times a day (QID) | RESPIRATORY_TRACT | Status: DC | PRN

## 2013-04-26 MED ORDER — AMOXICILLIN 875 MG PO TABS: 875.0000 mg | ORAL_TABLET | Freq: Two times a day (BID) | ORAL | Status: DC

## 2013-04-26 NOTE — Patient Instructions (Addendum)
Continue the nasal sprays that Dr [place] Callas gave her.  Use the saline nasal spray as directed.  I am going to give you an antibiotic (amoxicillin) to take one tablet 2x/day.  Robitussin DM as directed.  Use the inhalers as we discussed.

## 2013-04-27 LAB — BASIC METABOLIC PANEL
BUN/Creatinine Ratio: 16 (ref 8–20)
BUN: 14 mg/dL (ref 6–20)
CO2: 20 mmol/L (ref 19–28)
Calcium: 9.2 mg/dL (ref 8.7–10.2)
Chloride: 103 mmol/L (ref 97–108)
Creatinine, Ser: 0.86 mg/dL (ref 0.57–1.00)
GFR calc Af Amer: 103 mL/min/{1.73_m2} (ref >59)
GFR calc non Af Amer: 89 mL/min/{1.73_m2} (ref >59)
Glucose: 121 mg/dL — ABNORMAL HIGH (ref 65–99)
Potassium: 4.4 mmol/L (ref 3.5–5.2)
Sodium: 138 mmol/L (ref 134–144)

## 2013-04-27 LAB — HGB A1C W/O EAG: Hgb A1c MFr Bld: 7 % — ABNORMAL HIGH (ref 4.8–5.6)

## 2013-04-28 ENCOUNTER — Encounter: Payer: Self-pay | Admitting: Internal Medicine

## 2013-04-28 NOTE — Assessment & Plan Note (Signed)
A1c just checked 01/11/13 - 6.7.  Follow.  Diabetic diet and exercise.

## 2013-04-28 NOTE — Assessment & Plan Note (Signed)
Blood pressure doing better.  Follow.   

## 2013-04-28 NOTE — Progress Notes (Signed)
Subjective:    Patient ID: Shirley [name], female    DOB: [birth year], 32 y.o.   MRN: [id number]  Sore Throat  Associated symptoms include coughing and headaches.  Cough Associated symptoms include headaches.  Headache  Associated symptoms include coughing.  32 year old female with past history of hypertension, diabetes and GERD who comes in today as a work in with concerns regarding some increased congestion, irritated throat and cough.  She is receiving allergy injections.  Took an injection approximately one month ago.  Two days later developed a sore throat and loss of her voice.  Some congestion.  These symptoms improved and she got another injection one week ago.  Two days after the injection, symptoms returned.  She is now have increased nasal congestion.  No sinus pressure.  Night sweats.  Chest congestion and productive yellow mucus.  Irritated throat.     Past Medical History  Diagnosis Date  . Depression   . Diabetes mellitus   . Migraine   . Hypertension   . IBS (irritable bowel syndrome)   . GERD (gastroesophageal reflux disease)   . Anemia     Current Outpatient Prescriptions on File Prior to Visit  Medication Sig Dispense Refill  . azelastine (OPTIVAR) 0.05 % ophthalmic solution 1 drop 2 (two) times daily as needed.      . Cholecalciferol (VITAMIN D-3) 1000 UNITS CAPS Take 1 capsule by mouth daily.      . cyclobenzaprine (FLEXERIL) 10 MG tablet Take 1 tablet (10 mg total) by mouth 2 (two) times daily as needed for muscle spasms.  30 tablet  0  . Elastic Bandages & Supports (ELBOW BRACE) MISC Use elbow brace during the day. Remove at bedtime.  1 each  0  . levocetirizine (XYZAL) 5 MG tablet Take 5 mg by mouth every evening.      Marland Kitchen losartan-hydrochlorothiazide (HYZAAR) 100-12.5 MG per tablet Take 1 tablet by mouth daily.  90 tablet  3  . Magnesium Oxide (MAG-OX 400 PO) Take 1 tablet by mouth daily.      . mometasone (NASONEX) 50 MCG/ACT nasal spray Place 2 sprays into the  nose daily as needed.  17 g  2  . Olopatadine HCl (PATANASE) 0.6 % SOLN Place into the nose as needed.      . pantoprazole (PROTONIX) 40 MG tablet Take 40 mg by mouth daily.      Marland Kitchen pyridOXINE (VITAMIN B-6) 100 MG tablet Take 100 mg by mouth daily.      . SitaGLIPtin-MetFORMIN HCl (JANUMET XR) 50-1000 MG TB24 Take 1 tablet by mouth daily.      . SUMAtriptan (IMITREX) 50 MG tablet Take 50 mg by mouth daily as needed.      . traMADol (ULTRAM) 50 MG tablet Take 1 tablet (50 mg total) by mouth every 8 (eight) hours as needed for pain.  45 tablet  0  . zolpidem (AMBIEN) 5 MG tablet Take 5 mg by mouth at bedtime as needed.       No current facility-administered medications on file prior to visit.    Review of Systems  Respiratory: Positive for cough.   Neurological: Positive for headaches.  Patient denies any headache, lightheadedness or dizziness.  Nasal congestion as outlined.  No sinus pressure.  Increased chest congestion.  Cough.  Irritated throat.  No nausea or vomiting.  No bowel change.         Objective:   Physical Exam  Filed Vitals:   04/26/13 1611  BP: 120/80  Pulse: 106  Temp: 99 F (46.25 C)   32 year old female in no acute distress.   HEENT:  Nares - slightly erythematous turbinates   OP- without lesions or erythema.  No significant tenderness to palpation over the sinuses.   NECK:  Supple.  Minimal tenderness to palpation.   HEART:  Appears to be regular. LUNGS:  Without crackles or wheezing audible.  Respirations even and unlabored.  Increased cough with expiration.  ABDOMEN:  Soft, nontender.  No audible abdominal bruit.                   Assessment & Plan:  POSSIBLE URI/SINUSITIS.  Treat with amoxicillin 875mg  bid x 10 days.  Continue saline nasal spray and Flonase as directed.  Robitussin DM as directed.  Albuterol inhaler as directed.  Asmanex as directed.  Follow.  Notify me or be reevaluated if symptoms change, worsen or do not resolve.  She plans to discuss this  more with Dr [place] Callas - regarding the reactions after the injections.   INCREASED PSYCHOSOCIAL STRESSORS.  Handling things relatively well.  Follow.      HEALTH MAINTENANCE.  Physical 03/27/12.  Breast/pap and pelvic through GYN.  Cholesterol 5/13 wnl.

## 2013-05-02 ENCOUNTER — Telehealth: Payer: Self-pay

## 2013-05-02 NOTE — Telephone Encounter (Signed)
My Chart Message: I reviewed your recent lab results. Your overall sugar control (a1c) is slightly elevated. Recommend a low carb/diabetic diet. Exercise. Check and record your sugars twice a day and bring with you to your next appointment. (Also, send in some readings over the next couple of weeks). May need to adjust your medication. Your metabolic panel is within normal limits.   Left message for patient to call the office back about results

## 2013-05-03 ENCOUNTER — Encounter: Payer: Self-pay | Admitting: Internal Medicine

## 2013-05-26 ENCOUNTER — Ambulatory Visit (INDEPENDENT_AMBULATORY_CARE_PROVIDER_SITE_OTHER): Payer: 59 | Admitting: Internal Medicine

## 2013-05-26 ENCOUNTER — Encounter: Payer: Self-pay | Admitting: Internal Medicine

## 2013-05-26 VITALS — BP 120/70 | HR 93 | Temp 98.7°F | Ht 61.0 in | Wt 272.2 lb

## 2013-05-26 DIAGNOSIS — E559 Vitamin D deficiency, unspecified: Secondary | ICD-10-CM

## 2013-05-26 DIAGNOSIS — I10 Essential (primary) hypertension: Secondary | ICD-10-CM

## 2013-05-26 DIAGNOSIS — K219 Gastro-esophageal reflux disease without esophagitis: Secondary | ICD-10-CM

## 2013-05-26 DIAGNOSIS — E119 Type 2 diabetes mellitus without complications: Secondary | ICD-10-CM

## 2013-05-29 ENCOUNTER — Encounter: Payer: Self-pay | Admitting: Internal Medicine

## 2013-05-29 NOTE — Progress Notes (Signed)
Subjective:    Patient ID: Irving Copas, female    DOB: [birth year], 32 y.o.   MRN: [id number]  HPI 32 year old female with past history of hypertension, diabetes and GERD who comes in today for a scheduled follow up.  She states she has been doing relatively well.  Physical therapy helped her shoulder.  She was having increased pain in her right elbow.  Saw Dr [place] Potters.  Had an injection.  Better.  States her sugars have been elevated.  AM sugars averaging 140-150.  After she eats in the evening blood sugars averaging 170s.   Breathing stable.  No chest pain or tightness.   Seeing GYN.  Just evaluated 2/14 and had left lower quadrant pain.  Had a pelvic ultrasound.  Had follow up pelvic ultrasound in 6/14.  States everything checked out fine.  Still seeing Dr [place] Callas.  Was receiving allergy shots.  Felt bad after her last two injections.  See last note for details.  His office is aware.  Plans to f/u with him.  On protonix.  She has adjusted her diet some.  Has cut down on her sweets and decreased the amount of [place] she eats.     Past Medical History  Diagnosis Date  . Depression   . Diabetes mellitus   . Migraine   . Hypertension   . IBS (irritable bowel syndrome)   . GERD (gastroesophageal reflux disease)   . Anemia     Current Outpatient Prescriptions on File Prior to Visit  Medication Sig Dispense Refill  . azelastine (OPTIVAR) 0.05 % ophthalmic solution 1 drop 2 (two) times daily as needed.      . Cholecalciferol (VITAMIN D-3) 1000 UNITS CAPS Take 1 capsule by mouth daily.      . cyclobenzaprine (FLEXERIL) 10 MG tablet Take 1 tablet (10 mg total) by mouth 2 (two) times daily as needed for muscle spasms.  30 tablet  0  . Elastic Bandages & Supports (ELBOW BRACE) MISC Use elbow brace during the day. Remove at bedtime.  1 each  0  . levocetirizine (XYZAL) 5 MG tablet Take 5 mg by mouth every evening.      Marland Kitchen losartan-hydrochlorothiazide (HYZAAR) 100-12.5 MG per tablet Take 1 tablet by  mouth daily.  90 tablet  3  . Magnesium Oxide (MAG-OX 400 PO) Take 1 tablet by mouth daily.      . mometasone (NASONEX) 50 MCG/ACT nasal spray Place 2 sprays into the nose daily as needed.  17 g  2  . Olopatadine HCl (PATANASE) 0.6 % SOLN Place into the nose as needed.      . pantoprazole (PROTONIX) 40 MG tablet Take 40 mg by mouth daily.      Marland Kitchen pyridOXINE (VITAMIN B-6) 100 MG tablet Take 100 mg by mouth daily.      . SitaGLIPtin-MetFORMIN HCl (JANUMET XR) 50-1000 MG TB24 Take 1 tablet by mouth daily.      . SUMAtriptan (IMITREX) 50 MG tablet Take 50 mg by mouth daily as needed.      . zolpidem (AMBIEN) 5 MG tablet Take 5 mg by mouth at bedtime as needed.       No current facility-administered medications on file prior to visit.    Review of Systems Patient denies any headache, lightheadedness or dizziness.  Some allergy symptoms.  On protonix daily.  No chest pain, tightness or palpitations.  No increased shortness of breath, cough or congestion.  No nausea or vomiting.  No abdominal pain  or cramping.  Being followed by gyn for left lower quadrant pain.  Ultrasound looked good.  See above.  No significant pain now.  No bowel change, such as diarrhea, constipation, BRBPR or melana.  No urine change.  Sugars as outlined.         Objective:   Physical Exam  Filed Vitals:   05/26/13 1325  BP: 120/70  Pulse: 93  Temp: 98.7 F (60.45 C)   32 year old female in no acute distress.   HEENT:  Nares - clear.   OP- without lesions or erythema.   NECK:  Supple, nontender.  No audible bruit.   HEART:  Appears to be regular. LUNGS:  Without crackles or wheezing audible.  Respirations even and unlabored.   RADIAL PULSE:  Equal bilaterally.  ABDOMEN:  Soft, nontender.  No audible abdominal bruit.   EXTREMITIES:  No increased edema to be present.                   Assessment & Plan:  INCREASED PSYCHOSOCIAL STRESSORS.  Handling things relatively well.  Follow.      HEALTH MAINTENANCE.   Breast/pap and pelvic through GYN.  Cholesterol 01/11/13 wnl.

## 2013-05-29 NOTE — Assessment & Plan Note (Signed)
On protonix.  No reported problems with reflux currently.  Follow.

## 2013-05-29 NOTE — Assessment & Plan Note (Addendum)
A1c just checked and was elevated 7.0.   Discussed diet and exercise.  Sugars as outlined.  Follow.  Will increase Janumet XR to 100/1000 q day.  Follow.  Get her back in soon to reassess.

## 2013-05-29 NOTE — Assessment & Plan Note (Signed)
Blood pressure doing better.  Follow.   

## 2013-05-29 NOTE — Assessment & Plan Note (Signed)
Continue supplementation.  Follow.  

## 2013-06-15 ENCOUNTER — Encounter: Payer: Self-pay | Admitting: Internal Medicine

## 2013-06-23 ENCOUNTER — Encounter: Payer: Self-pay | Admitting: *Deleted

## 2013-06-27 ENCOUNTER — Encounter: Payer: Self-pay | Admitting: Internal Medicine

## 2013-06-27 ENCOUNTER — Ambulatory Visit (INDEPENDENT_AMBULATORY_CARE_PROVIDER_SITE_OTHER): Payer: 59 | Admitting: Internal Medicine

## 2013-06-27 VITALS — BP 130/70 | HR 87 | Temp 99.1°F | Ht 61.0 in | Wt 272.2 lb

## 2013-06-27 DIAGNOSIS — K219 Gastro-esophageal reflux disease without esophagitis: Secondary | ICD-10-CM

## 2013-06-27 DIAGNOSIS — E559 Vitamin D deficiency, unspecified: Secondary | ICD-10-CM

## 2013-06-27 DIAGNOSIS — I10 Essential (primary) hypertension: Secondary | ICD-10-CM

## 2013-06-27 DIAGNOSIS — E119 Type 2 diabetes mellitus without complications: Secondary | ICD-10-CM

## 2013-06-27 LAB — HM DIABETES FOOT EXAM

## 2013-06-27 MED ORDER — PANTOPRAZOLE SODIUM 40 MG PO TBEC: 40.0000 mg | DELAYED_RELEASE_TABLET | Freq: Every day | ORAL | Status: DC

## 2013-06-27 NOTE — Progress Notes (Signed)
Subjective:    Patient ID: Irving Copas, female    DOB: Sep 01, 1981, 32 y.o.   MRN: [id number]  HPI 32 year old female with past history of hypertension, diabetes and GERD who comes in today for a scheduled follow up.  She states that starting last week, she noticed a "knot" in her stomach.  Decreased appetite.  Some nausea.  No vomiting.  Also has had increased acid reflux.  Taking gaviscon.  Some increased constipation last week.  Diarrhea over the weekend.  States bowel movements are getting back to normal now.  Ran out of her protonix.  Off now for approximately two weeks.  No blood.  Sugars varying.  AM sugars 138-140 and PM sugars 180s.  Takes her Janumet in the evening.      Past Medical History  Diagnosis Date  . Depression   . Diabetes mellitus   . Migraine   . Hypertension   . IBS (irritable bowel syndrome)   . GERD (gastroesophageal reflux disease)   . Anemia     Current Outpatient Prescriptions on File Prior to Visit  Medication Sig Dispense Refill  . azelastine (OPTIVAR) 0.05 % ophthalmic solution 1 drop 2 (two) times daily as needed.      . Cholecalciferol (VITAMIN D-3) 1000 UNITS CAPS Take 1 capsule by mouth daily.      . cyclobenzaprine (FLEXERIL) 10 MG tablet Take 1 tablet (10 mg total) by mouth 2 (two) times daily as needed for muscle spasms.  30 tablet  0  . Elastic Bandages & Supports (ELBOW BRACE) MISC Use elbow brace during the day. Remove at bedtime.  1 each  0  . levocetirizine (XYZAL) 5 MG tablet Take 5 mg by mouth every evening.      Marland Kitchen losartan-hydrochlorothiazide (HYZAAR) 100-12.5 MG per tablet Take 1 tablet by mouth daily.  90 tablet  3  . Magnesium Oxide (MAG-OX 400 PO) Take 1 tablet by mouth daily.      . mometasone (NASONEX) 50 MCG/ACT nasal spray Place 2 sprays into the nose daily as needed.  17 g  2  . Olopatadine HCl (PATANASE) 0.6 % SOLN Place into the nose as needed.      . pyridOXINE (VITAMIN B-6) 100 MG tablet Take 100 mg by mouth daily.      .  SUMAtriptan (IMITREX) 50 MG tablet Take 50 mg by mouth daily as needed.      . zolpidem (AMBIEN) 5 MG tablet Take 5 mg by mouth at bedtime as needed.       No current facility-administered medications on file prior to visit.    Review of Systems Patient denies any headache, lightheadedness or dizziness.  No significant allergy symptoms.  Was on protonix.  Has been out now for a couple of weeks.  No chest pain, tightness or palpitations.  No increased shortness of breath, cough or congestion.  Does report the nausea and acid reflux.  No vomiting.  Epigastric "knot".  No other abdominal pain.   No BRBPR or melana.  Bowels getting back to normal.  No urine change.  Sugars as outlined.         Objective:   Physical Exam  Filed Vitals:   06/27/13 1345  BP: 130/70  Pulse: 87  Temp: 99.1 F (56.109 C)   32 year old female in no acute distress.   HEENT:  Nares - clear.   OP- without lesions or erythema.   NECK:  Supple, nontender.  No audible bruit.  HEART:  Appears to be regular. LUNGS:  Without crackles or wheezing audible.  Respirations even and unlabored.   RADIAL PULSE:  Equal bilaterally.  ABDOMEN:  Soft.  Minimal tenderness to palpation over the epigastric region.  No rebound or guarding.   No audible abdominal bruit.   EXTREMITIES:  No increased edema to be present.  Feet without lesions.                   Assessment & Plan:  INCREASED PSYCHOSOCIAL STRESSORS.  Handling things relatively well.  Follow.      HEALTH MAINTENANCE.  Breast/pap and pelvic through GYN.  Cholesterol 06/15/13 wnl.

## 2013-06-28 ENCOUNTER — Encounter: Payer: Self-pay | Admitting: Internal Medicine

## 2013-06-28 NOTE — Assessment & Plan Note (Signed)
Blood pressure doing better.  Follow.   

## 2013-06-28 NOTE — Assessment & Plan Note (Signed)
Had been on protonix.  Off for approximately two weeks.  Symptoms as outlined.  Restart protonix 40mg  q am and add zantac 150mg  q pm.  Has an appt with GI.  Keep appt.  Had return of symptoms with stopping protonix.  Follow closely.  Get her back in soon to reassess.

## 2013-06-28 NOTE — Assessment & Plan Note (Signed)
A1c last checked here was elevated 7.0.   Discussed diet and exercise.  Sugars as outlined.  Follow.  On Janumet XR to 100/1000 q day.  Will add metformin 500mg  q day.  Get her back in soon to reassess.  She was given order for urine microalb/cr ratio.

## 2013-06-28 NOTE — Assessment & Plan Note (Signed)
Continue supplementation.  Follow.  

## 2013-07-04 ENCOUNTER — Encounter: Payer: Self-pay | Admitting: Internal Medicine

## 2013-07-26 ENCOUNTER — Encounter: Payer: Self-pay | Admitting: Internal Medicine

## 2013-07-26 ENCOUNTER — Other Ambulatory Visit: Payer: Self-pay | Admitting: Internal Medicine

## 2013-07-26 ENCOUNTER — Ambulatory Visit (INDEPENDENT_AMBULATORY_CARE_PROVIDER_SITE_OTHER): Payer: 59 | Admitting: Internal Medicine

## 2013-07-26 VITALS — BP 134/98 | HR 82 | Temp 98.6°F | Ht 61.0 in | Wt 266.8 lb

## 2013-07-26 DIAGNOSIS — R109 Unspecified abdominal pain: Secondary | ICD-10-CM

## 2013-07-26 DIAGNOSIS — K219 Gastro-esophageal reflux disease without esophagitis: Secondary | ICD-10-CM

## 2013-07-26 DIAGNOSIS — I10 Essential (primary) hypertension: Secondary | ICD-10-CM

## 2013-07-26 DIAGNOSIS — R809 Proteinuria, unspecified: Secondary | ICD-10-CM

## 2013-07-26 DIAGNOSIS — E119 Type 2 diabetes mellitus without complications: Secondary | ICD-10-CM

## 2013-07-26 MED ORDER — PANTOPRAZOLE SODIUM 40 MG PO TBEC: 40.0000 mg | DELAYED_RELEASE_TABLET | Freq: Two times a day (BID) | ORAL | Status: DC

## 2013-07-26 MED ORDER — SITAGLIP PHOS-METFORMIN HCL ER 100-1000 MG PO TB24: 1.0000 | ORAL_TABLET | Freq: Every day | ORAL | Status: DC

## 2013-07-26 NOTE — Assessment & Plan Note (Addendum)
A1c last checked here was elevated 7.0.   Discussed diet and exercise.  Not checking sugars.  Stressed to her the importance of checking her sugars.   On Janumet XR to 100/1000 q day and metformin 500mg  q day.  Get her back in soon to reassess.

## 2013-07-26 NOTE — Progress Notes (Signed)
Subjective:    Patient ID: Shirley [name], female    DOB: Mar 25, 1981, 32 y.o.   MRN: [id number]  HPI 32 year old female with past history of hypertension, diabetes and GERD who comes in today for a scheduled follow up.  She states that starting a few days ago, she noticed some burning in her abdomen.  Also has some epigastric pain.  The burning sensation occurs all over her abdomen.  Also reports decreased appetite.  Some nausea.  No vomiting.  Has not had increased acid reflux.  Some minima acid.  Bowels normal.  States symptoms do not appear to change with eating.  She is taking the protonix daily.  Not checking her sugars.  Has not taken her blood pressure medication today.      Past Medical History  Diagnosis Date  . Depression   . Diabetes mellitus   . Migraine   . Hypertension   . IBS (irritable bowel syndrome)   . GERD (gastroesophageal reflux disease)   . Anemia     Current Outpatient Prescriptions on File Prior to Visit  Medication Sig Dispense Refill  . azelastine (OPTIVAR) 0.05 % ophthalmic solution 1 drop 2 (two) times daily as needed.      . Cholecalciferol (VITAMIN D-3) 1000 UNITS CAPS Take 1 capsule by mouth daily.      . cyclobenzaprine (FLEXERIL) 10 MG tablet Take 1 tablet (10 mg total) by mouth 2 (two) times daily as needed for muscle spasms.  30 tablet  0  . Elastic Bandages & Supports (ELBOW BRACE) MISC Use elbow brace during the day. Remove at bedtime.  1 each  0  . levocetirizine (XYZAL) 5 MG tablet Take 5 mg by mouth every evening.      Marland Kitchen losartan-hydrochlorothiazide (HYZAAR) 100-12.5 MG per tablet Take 1 tablet by mouth daily.  90 tablet  3  . Magnesium Oxide (MAG-OX 400 PO) Take 1 tablet by mouth daily.      . mometasone (NASONEX) 50 MCG/ACT nasal spray Place 2 sprays into the nose daily as needed.  17 g  2  . Olopatadine HCl (PATANASE) 0.6 % SOLN Place into the nose as needed.      . pantoprazole (PROTONIX) 40 MG tablet Take 1 tablet (40 mg total) by mouth  daily.  30 tablet  1  . pyridOXINE (VITAMIN B-6) 100 MG tablet Take 100 mg by mouth daily.      . SitaGLIPtin-MetFORMIN HCl (JANUMET XR) [phone] MG TB24 Take by mouth daily.      . SUMAtriptan (IMITREX) 50 MG tablet Take 50 mg by mouth daily as needed.      . zolpidem (AMBIEN) 5 MG tablet Take 5 mg by mouth at bedtime as needed.       No current facility-administered medications on file prior to visit.    Review of Systems Patient denies any headache, lightheadedness or dizziness.  No significant allergy symptoms.   No chest pain, tightness or palpitations.  No increased shortness of breath, cough or congestion.  Does report the nausea and abdominal discomfort as outlined.  No vomiting.  Epigastric discomfort.  Also, some diffuse abdominal burning.   No BRBPR or melana.  Bowels normal.  No urine change.  No vaginal complaints.  Not checking sugars.          Objective:   Physical Exam  Filed Vitals:   07/26/13 1139  BP: 134/98  Pulse: 82  Temp: 98.6 F (37 C)   Blood pressure recheck:  128/86-88, pulse 78  32 year old female in no acute distress.   HEENT:  Nares - clear.   OP- without lesions or erythema.   NECK:  Supple, nontender.  No audible bruit.   HEART:  Appears to be regular. LUNGS:  Without crackles or wheezing audible.  Respirations even and unlabored.   RADIAL PULSE:  Equal bilaterally.  ABDOMEN:  Soft.  Minimal tenderness to palpation over the epigastric region.  No other abdominal pain to palpation.  No rebound or guarding.   No audible abdominal bruit.   EXTREMITIES:  No increased edema to be present.  Feet without lesions.                   Assessment & Plan:  INCREASED PSYCHOSOCIAL STRESSORS.  Handling things relatively well.  Follow.      HEALTH MAINTENANCE.  Breast/pap and pelvic through GYN.  Cholesterol 06/15/13 wnl.

## 2013-07-26 NOTE — Assessment & Plan Note (Addendum)
Blood pressure as outlined.  She has not been feeling well.  Did not take her blood pressure medication this am.  Treat current symptoms.  Have her spot check her pressure.  Get her back in soon to reassess.  If persistent elevation - will require further adjustments in her medication.

## 2013-07-27 ENCOUNTER — Encounter: Payer: Self-pay | Admitting: Internal Medicine

## 2013-07-27 DIAGNOSIS — R809 Proteinuria, unspecified: Secondary | ICD-10-CM | POA: Insufficient documentation

## 2013-07-27 NOTE — Assessment & Plan Note (Signed)
On losartan.  Follow renal function.   

## 2013-07-27 NOTE — Assessment & Plan Note (Signed)
Epigastric pain and diffuse burning abdominal pain.  Treat with protonix and zantac as outlined.  Refer back to GI given persistent symptoms despite medications.  Check abdominal ultrasound.  Check amylase, lipase and liver panel.

## 2013-07-27 NOTE — Assessment & Plan Note (Signed)
On protonix.  Will add zantac in the evening.  Refer to GI given the persistent symptoms despite medications.

## 2013-07-28 ENCOUNTER — Ambulatory Visit: Payer: Self-pay | Admitting: Internal Medicine

## 2013-07-29 ENCOUNTER — Telehealth: Payer: Self-pay | Admitting: Internal Medicine

## 2013-07-29 NOTE — Telephone Encounter (Signed)
Pt notified of abdominal US results.

## 2013-08-01 LAB — CBC/DIFF AMBIGUOUS DEFAULT
Basophils Absolute: 0 10*3/uL (ref 0.0–0.2)
Basos: 0 % (ref 0–3)
Eos: 1 % (ref 0–5)
Eosinophils Absolute: 0.1 10*3/uL (ref 0.0–0.4)
HCT: 37.2 % (ref 34.0–46.6)
Hemoglobin: 11.7 g/dL (ref 11.1–15.9)
Immature Grans (Abs): 0 10*3/uL (ref 0.0–0.1)
Immature Granulocytes: 0 % (ref 0–2)
Lymphocytes Absolute: 3.5 10*3/uL — ABNORMAL HIGH (ref 0.7–3.1)
Lymphs: 40 % (ref 14–46)
MCH: 22.3 pg — ABNORMAL LOW (ref 26.6–33.0)
MCHC: 31.5 g/dL (ref 31.5–35.7)
MCV: 71 fL — ABNORMAL LOW (ref 79–97)
Monocytes Absolute: 0.5 10*3/uL (ref 0.1–0.9)
Monocytes: 5 % (ref 4–12)
Neutrophils Absolute: 4.8 10*3/uL (ref 1.4–7.0)
Neutrophils Relative %: 54 % (ref 40–74)
Platelets: 313 10*3/uL (ref 150–379)
RBC: 5.25 x10E6/uL (ref 3.77–5.28)
RDW: 15.4 % (ref 12.3–15.4)
WBC: 8.8 10*3/uL (ref 3.4–10.8)

## 2013-08-01 LAB — HEPATIC FUNCTION PANEL
ALT: 18 IU/L (ref 0–32)
AST: 20 IU/L (ref 0–40)
Albumin: 4 g/dL (ref 3.5–5.5)
Alkaline Phosphatase: 79 IU/L (ref 39–117)
Bilirubin, Direct: 0.1 mg/dL (ref 0.00–0.40)
Total Bilirubin: 0.3 mg/dL (ref 0.0–1.2)
Total Protein: 6.6 g/dL (ref 6.0–8.5)

## 2013-08-01 LAB — LIPASE: Lipase: 32 U/L (ref 0–59)

## 2013-08-02 ENCOUNTER — Encounter: Payer: Self-pay | Admitting: Internal Medicine

## 2013-08-03 ENCOUNTER — Encounter: Payer: Self-pay | Admitting: Internal Medicine

## 2013-08-09 ENCOUNTER — Other Ambulatory Visit: Payer: Self-pay | Admitting: *Deleted

## 2013-08-09 MED ORDER — SITAGLIP PHOS-METFORMIN HCL ER 100-1000 MG PO TB24: 1.0000 | ORAL_TABLET | Freq: Every day | ORAL | Status: DC

## 2013-08-10 ENCOUNTER — Encounter: Payer: Self-pay | Admitting: Internal Medicine

## 2013-08-15 ENCOUNTER — Other Ambulatory Visit: Payer: Self-pay | Admitting: Allergy

## 2013-08-15 ENCOUNTER — Ambulatory Visit
Admission: RE | Admit: 2013-08-15 | Discharge: 2013-08-15 | Disposition: A | Discharge status: Home / Self Care | Payer: 59 | Source: Ambulatory Visit | Attending: Allergy | Admitting: Allergy

## 2013-08-15 DIAGNOSIS — J45909 Unspecified asthma, uncomplicated: Secondary | ICD-10-CM

## 2013-08-18 ENCOUNTER — Ambulatory Visit: Payer: 59 | Admitting: Internal Medicine

## 2013-09-08 ENCOUNTER — Encounter: Payer: Self-pay | Admitting: Internal Medicine

## 2013-09-08 ENCOUNTER — Ambulatory Visit (INDEPENDENT_AMBULATORY_CARE_PROVIDER_SITE_OTHER): Payer: 59 | Admitting: Internal Medicine

## 2013-09-08 VITALS — BP 130/90 | HR 95 | Temp 98.9°F | Ht 61.0 in | Wt 264.8 lb

## 2013-09-08 DIAGNOSIS — K219 Gastro-esophageal reflux disease without esophagitis: Secondary | ICD-10-CM

## 2013-09-08 DIAGNOSIS — I10 Essential (primary) hypertension: Secondary | ICD-10-CM

## 2013-09-08 DIAGNOSIS — E119 Type 2 diabetes mellitus without complications: Secondary | ICD-10-CM

## 2013-09-08 DIAGNOSIS — R109 Unspecified abdominal pain: Secondary | ICD-10-CM

## 2013-09-08 DIAGNOSIS — R0602 Shortness of breath: Secondary | ICD-10-CM

## 2013-09-08 DIAGNOSIS — Z9109 Other allergy status, other than to drugs and biological substances: Secondary | ICD-10-CM

## 2013-09-13 ENCOUNTER — Encounter: Payer: Self-pay | Admitting: *Deleted

## 2013-09-15 ENCOUNTER — Encounter: Payer: Self-pay | Admitting: Internal Medicine

## 2013-09-15 DIAGNOSIS — R0602 Shortness of breath: Secondary | ICD-10-CM | POA: Insufficient documentation

## 2013-09-15 DIAGNOSIS — Z9109 Other allergy status, other than to drugs and biological substances: Secondary | ICD-10-CM | POA: Insufficient documentation

## 2013-09-15 NOTE — Assessment & Plan Note (Signed)
Still persistent sob despite treatment.  EKG obtained and revealed SR with flattening of Tw[place] in III.  Given the persistent sob and risk factors, will obtain ECHO to evaluate LV function, wall motion abnormality, valve status and pulmonary artery pressure.

## 2013-09-15 NOTE — Assessment & Plan Note (Signed)
Seeing Dr [place] Callas.  Use the inhalers regularly.

## 2013-09-15 NOTE — Assessment & Plan Note (Signed)
Seeing GI.  Is better.  Continue f/u with GI.  Still with some persistent abdominal discomfort.

## 2013-09-15 NOTE — Assessment & Plan Note (Signed)
Discussed diet and exercise.  Sugars as outlined.  Stressed to her the importance of checking her sugars.   On Janumet XR to 100/1000 q day and metformin 500mg  q day.  Check metabolic panel and a1c.

## 2013-09-15 NOTE — Assessment & Plan Note (Signed)
On protonix and zantac.  Follow.   

## 2013-09-15 NOTE — Assessment & Plan Note (Signed)
Blood pressure on recheck improved.  Follow.  Same medication regimen.  Follow metabolic panel.

## 2013-09-15 NOTE — Progress Notes (Signed)
Subjective:    Patient ID: Shirley [name], female    DOB: 1981/07/25, 32 y.o.   MRN: [id number]  HPI 32 year old female with past history of hypertension, diabetes and GERD who comes in today for a scheduled follow up.  States overall she is doing some better.  Still with some intermittent abdominal discomfort.  Seeing GI.  States her sugars are averaging 129-130s in the am and 147-154 in the pm.  Handling stress relatively well.  She has seen Dr [place] Callas.  Is using an inhaler and was given a nebulizer in his office.  Also placed on an allergy pill and a prednisone taper.  No chest pain, but still notices some sob with exertion.  No increased cough or congestion.        Past Medical History  Diagnosis Date  . Depression   . Diabetes mellitus   . Migraine   . Hypertension   . IBS (irritable bowel syndrome)   . GERD (gastroesophageal reflux disease)   . Anemia     Current Outpatient Prescriptions on File Prior to Visit  Medication Sig Dispense Refill  . azelastine (OPTIVAR) 0.05 % ophthalmic solution 1 drop 2 (two) times daily as needed.      . Cholecalciferol (VITAMIN D-3) 1000 UNITS CAPS Take 1 capsule by mouth daily.      . cyclobenzaprine (FLEXERIL) 10 MG tablet Take 1 tablet (10 mg total) by mouth 2 (two) times daily as needed for muscle spasms.  30 tablet  0  . Elastic Bandages & Supports (ELBOW BRACE) MISC Use elbow brace during the day. Remove at bedtime.  1 each  0  . levocetirizine (XYZAL) 5 MG tablet Take 5 mg by mouth every evening.      Marland Kitchen losartan-hydrochlorothiazide (HYZAAR) 100-12.5 MG per tablet Take 1 tablet by mouth daily.  90 tablet  3  . Magnesium Oxide (MAG-OX 400 PO) Take 1 tablet by mouth daily.      . mometasone (NASONEX) 50 MCG/ACT nasal spray Place 2 sprays into the nose daily as needed.  17 g  2  . Olopatadine HCl (PATANASE) 0.6 % SOLN Place into the nose as needed.      . pantoprazole (PROTONIX) 40 MG tablet Take 1 tablet (40 mg total) by mouth 2 (two) times  daily.  60 tablet  4  . pyridOXINE (VITAMIN B-6) 100 MG tablet Take 100 mg by mouth daily.      . SitaGLIPtin-MetFORMIN HCl (JANUMET XR) [phone] MG TB24 Take 1 tablet by mouth daily.  30 tablet  5  . SUMAtriptan (IMITREX) 50 MG tablet Take 50 mg by mouth daily as needed.      . zolpidem (AMBIEN) 5 MG tablet Take 5 mg by mouth at bedtime as needed.       No current facility-administered medications on file prior to visit.    Review of Systems Patient denies any headache, lightheadedness or dizziness.  No significant allergy symptoms.   No chest pain, tightness or palpitations.  No cough or congestion.  Does report some sob as outlined above.  No vomiting.  No acid reflux.   No BRBPR or melana.  Bowels normal.  Some abdominal discomfort as outlined.  Seeing GI.  Is some better.  No urine change.  No vaginal complaints.  Sugars as outlined.          Objective:   Physical Exam  Filed Vitals:   09/08/13 1501  BP: 130/90  Pulse: 95  Temp: 98.9 F (  37.2 C)   Blood pressure recheck:  130/78, pulse 59  32 year old female in no acute distress.   HEENT:  Nares - clear.   OP- without lesions or erythema.   NECK:  Supple, nontender.  No audible bruit.   HEART:  Appears to be regular. LUNGS:  Without crackles or wheezing audible.  Respirations even and unlabored.   RADIAL PULSE:  Equal bilaterally.  ABDOMEN:  Soft.  Minimal tenderness to palpation over the epigastric region.  No other abdominal pain to palpation.  No rebound or guarding.   No audible abdominal bruit.   EXTREMITIES:  No increased edema to be present.  Feet without lesions.                   Assessment & Plan:  INCREASED PSYCHOSOCIAL STRESSORS.  Handling things relatively well.  Follow.      HEALTH MAINTENANCE.  Breast/pap and pelvic through GYN.  Cholesterol 06/15/13 wnl.

## 2013-09-16 ENCOUNTER — Telehealth: Payer: Self-pay | Admitting: Emergency Medicine

## 2013-09-16 NOTE — Telephone Encounter (Signed)
LVM on home phone for patient to call our office in reference to echo scheduled with [facility], [address] [place] on November 14th at 11:00 a.m. Arriving @ 10:45 a.m.

## 2013-09-29 ENCOUNTER — Other Ambulatory Visit: Payer: Self-pay

## 2013-10-07 ENCOUNTER — Other Ambulatory Visit (INDEPENDENT_AMBULATORY_CARE_PROVIDER_SITE_OTHER): Payer: 59

## 2013-10-07 DIAGNOSIS — I059 Rheumatic mitral valve disease, unspecified: Secondary | ICD-10-CM

## 2013-10-07 DIAGNOSIS — R0602 Shortness of breath: Secondary | ICD-10-CM

## 2013-10-10 ENCOUNTER — Encounter: Payer: Self-pay | Admitting: Internal Medicine

## 2013-10-18 ENCOUNTER — Encounter: Payer: Self-pay | Admitting: Internal Medicine

## 2013-10-28 ENCOUNTER — Ambulatory Visit: Payer: Self-pay | Admitting: Gastroenterology

## 2013-11-08 ENCOUNTER — Ambulatory Visit (INDEPENDENT_AMBULATORY_CARE_PROVIDER_SITE_OTHER): Payer: 59 | Admitting: Internal Medicine

## 2013-11-08 ENCOUNTER — Encounter: Payer: Self-pay | Admitting: Internal Medicine

## 2013-11-08 VITALS — BP 128/80 | HR 83 | Temp 98.3°F | Resp 12 | Ht 61.0 in | Wt 269.5 lb

## 2013-11-08 DIAGNOSIS — R0602 Shortness of breath: Secondary | ICD-10-CM

## 2013-11-08 DIAGNOSIS — I10 Essential (primary) hypertension: Secondary | ICD-10-CM

## 2013-11-08 DIAGNOSIS — E559 Vitamin D deficiency, unspecified: Secondary | ICD-10-CM

## 2013-11-08 DIAGNOSIS — K219 Gastro-esophageal reflux disease without esophagitis: Secondary | ICD-10-CM

## 2013-11-08 DIAGNOSIS — R109 Unspecified abdominal pain: Secondary | ICD-10-CM

## 2013-11-08 DIAGNOSIS — Z9109 Other allergy status, other than to drugs and biological substances: Secondary | ICD-10-CM

## 2013-11-08 DIAGNOSIS — R809 Proteinuria, unspecified: Secondary | ICD-10-CM

## 2013-11-08 DIAGNOSIS — E119 Type 2 diabetes mellitus without complications: Secondary | ICD-10-CM

## 2013-11-08 NOTE — Progress Notes (Signed)
Subjective:    Patient ID: Shirley [name], female    DOB: 14-Jul-1981, 32 y.o.   MRN: [id number]  HPI 32 year old female with past history of hypertension, diabetes and GERD who comes in today for a scheduled follow up.  States overall she is doing better.  Seeing GI.  Just had gastric emptying study.  Ordered by GI.  Unsure of results.  States they wanted to start her on a medication.  She never got the medications.   Planning to call and f/u with them.  Sugars are averaging 120-140s varying through the remainder of the day.  See attached chart for details of her sugar.  Handling stress relatively well.  She has seen Dr [place] Callas.  States she was diagnosed with asthma.  Wants a second opinion.  Wants to see Dr Meredeth Ide.  Feels her breathing is stable.  No sob.   No chest pain.  No increased cough or congestion.   She reports blood pressure doing well.      Past Medical History  Diagnosis Date  . Depression   . Diabetes mellitus   . Migraine   . Hypertension   . IBS (irritable bowel syndrome)   . GERD (gastroesophageal reflux disease)   . Anemia     Current Outpatient Prescriptions on File Prior to Visit  Medication Sig Dispense Refill  . levocetirizine (XYZAL) 5 MG tablet Take 5 mg by mouth every evening.      Marland Kitchen losartan-hydrochlorothiazide (HYZAAR) 100-12.5 MG per tablet Take 1 tablet by mouth daily.  90 tablet  3  . Olopatadine HCl (PATANASE) 0.6 % SOLN Place into the nose as needed.      . pantoprazole (PROTONIX) 40 MG tablet Take 1 tablet (40 mg total) by mouth 2 (two) times daily.  60 tablet  4  . SitaGLIPtin-MetFORMIN HCl (JANUMET XR) [phone] MG TB24 Take 1 tablet by mouth daily.  30 tablet  5  . zolpidem (AMBIEN) 5 MG tablet Take 5 mg by mouth at bedtime as needed.      . Elastic Bandages & Supports (ELBOW BRACE) MISC Use elbow brace during the day. Remove at bedtime.  1 each  0   No current facility-administered medications on file prior to visit.    Review of Systems Patient  denies any headache, lightheadedness or dizziness.  No significant allergy symptoms.   No chest pain, tightness or palpitations.  No cough or congestion.  No sob.  No vomiting.  No acid reflux.   No BRBPR or melana.  Bowels normal. Has had some abdominal discomfort as outlined in previous notes.  Better now.  Undergoing GI w/up as outlined.  She plans to f/u with GI.   No urine change.  No vaginal complaints.  Sugars as outlined.   States Dr [place] Callas diagnosed her with asthma.  Wants a second opinion.  She is not taking the correct dose of the Janumet.  States she had some 50/1000 dose left over and started taking these instead of her 100/1000 dose.       Objective:   Physical Exam  Filed Vitals:   11/08/13 1615  BP: 128/80  Pulse: 83  Temp: 98.3 F (36.8 C)  Resp: 12   Blood pressure recheck:  5/70  32 year old female in no acute distress.   HEENT:  Nares - clear.   OP- without lesions or erythema.   NECK:  Supple, nontender.  No audible bruit.   HEART:  Appears to be regular. LUNGS:  Without crackles or wheezing audible.  Respirations even and unlabored.   RADIAL PULSE:  Equal bilaterally.  ABDOMEN:  Soft.  Minimal tenderness to palpation over the epigastric region.  No other abdominal pain to palpation.  No rebound or guarding.   No audible abdominal bruit.   EXTREMITIES:  No increased edema to be present.  Feet without lesions.                   Assessment & Plan:  INCREASED PSYCHOSOCIAL STRESSORS.  Handling things relatively well.  Follow.      HEALTH MAINTENANCE.  Breast/pap and pelvic through GYN.  Cholesterol 06/15/13 wnl.

## 2013-11-08 NOTE — Assessment & Plan Note (Addendum)
Discussed diet and exercise.  Sugars as outlined.  Stressed to her the importance of checking her sugars.   Supposed to be on Janumet XR 100/1000 q day.  She is not taking this dose as outlined.  Restart.  After discussion with her, it was decided to refer her to endocrinology for further evaluation and treatment.  Follow metabolic panel and a1c.

## 2013-11-08 NOTE — Progress Notes (Signed)
Pre visit review using our clinic review tool, if applicable. No additional management support is needed unless otherwise documented below in the visit note. 

## 2013-11-13 ENCOUNTER — Encounter: Payer: Self-pay | Admitting: Internal Medicine

## 2013-11-13 NOTE — Assessment & Plan Note (Signed)
On losartan.  Follow renal function.   

## 2013-11-13 NOTE — Assessment & Plan Note (Signed)
On protonix and zantac.  Follow.   

## 2013-11-13 NOTE — Assessment & Plan Note (Signed)
Seeing GI.  Is better.  Continue f/u with GI.  Just had gastric emptying study.  Plans to f/u with GI for further evaluation, treatment and w/up.

## 2013-11-13 NOTE — Assessment & Plan Note (Signed)
ECHO just checked and ok.  Breathing better.  Doing well.  Has been seeing Dr [place] Callas.  Diagnosed with asthma.  Wants a second opinion.  Wants to see Dr Meredeth Ide.  Follow.

## 2013-11-13 NOTE — Assessment & Plan Note (Signed)
Continue supplementation.  Follow.  

## 2013-11-13 NOTE — Assessment & Plan Note (Signed)
Blood pressure as outlined.  Follow. Has been better.   Continue same medication regimen.  Follow pressures and follow metabolic panel.

## 2013-11-13 NOTE — Assessment & Plan Note (Signed)
Seeing Dr Sharma.  Stable.  

## 2013-12-05 NOTE — Addendum Note (Signed)
Addended by: Charm BargesSCOTT, Reagan Behlke S on: 12/05/2013 06:03 PM   Modules accepted: Orders

## 2013-12-14 ENCOUNTER — Encounter: Payer: Self-pay | Admitting: Emergency Medicine

## 2014-01-05 ENCOUNTER — Encounter: Payer: Self-pay | Admitting: Internal Medicine

## 2014-01-25 ENCOUNTER — Encounter: Payer: Self-pay | Admitting: Internal Medicine

## 2014-02-01 ENCOUNTER — Encounter: Payer: Self-pay | Admitting: Internal Medicine

## 2014-02-01 ENCOUNTER — Ambulatory Visit (INDEPENDENT_AMBULATORY_CARE_PROVIDER_SITE_OTHER): Payer: 59 | Admitting: Internal Medicine

## 2014-02-01 VITALS — BP 120/88 | HR 84 | Temp 99.0°F | Resp 16 | Ht 61.0 in | Wt 271.0 lb

## 2014-02-01 DIAGNOSIS — E119 Type 2 diabetes mellitus without complications: Secondary | ICD-10-CM

## 2014-02-01 DIAGNOSIS — N649 Disorder of breast, unspecified: Secondary | ICD-10-CM

## 2014-02-01 MED ORDER — SULFAMETHOXAZOLE-TMP DS 800-160 MG PO TABS: 1.0000 | ORAL_TABLET | Freq: Two times a day (BID) | ORAL | Status: DC

## 2014-02-01 NOTE — Progress Notes (Signed)
Pre visit review using our clinic review tool, if applicable. No additional management support is needed unless otherwise documented below in the visit note. 

## 2014-02-05 ENCOUNTER — Encounter: Payer: Self-pay | Admitting: Internal Medicine

## 2014-02-05 NOTE — Assessment & Plan Note (Signed)
Discussed with her regarding her sugars.  She will check and record sugars.  Follow.  Bring readings to next appt.  Adjust medications as needed.

## 2014-02-05 NOTE — Progress Notes (Signed)
  Subjective:    Patient ID: Shirley [name], female    DOB: 01-21-1981, 33 y.o.   MRN: [id number]  HPI 33 year old female with past history of hypertension, diabetes and GERD who comes in today as a work in with concerns regarding a persistent breast lesion.  Flares intermittently.  Most recent flare - over the last week.  Will notice some tenderness and then the place will become larger.  Then will drain.  Is better now.  Still draining.   No significant tenderness now, but still has the skin lesion.       Past Medical History  Diagnosis Date  . Depression   . Diabetes mellitus   . Migraine   . Hypertension   . IBS (irritable bowel syndrome)   . GERD (gastroesophageal reflux disease)   . Anemia     Current Outpatient Prescriptions on File Prior to Visit  Medication Sig Dispense Refill  . albuterol (PROVENTIL HFA;VENTOLIN HFA) 108 (90 BASE) MCG/ACT inhaler Inhale into the lungs every 6 (six) hours as needed for wheezing or shortness of breath.      Marland Kitchen. azelastine (OPTIVAR) 0.05 % ophthalmic solution 1 drop 2 (two) times daily.      . beclomethasone (QVAR) 40 MCG/ACT inhaler Inhale 1 puff into the lungs daily.      . Elastic Bandages & Supports (ELBOW BRACE) MISC Use elbow brace during the day. Remove at bedtime.  1 each  0  . levocetirizine (XYZAL) 5 MG tablet Take 5 mg by mouth every evening.      Marland Kitchen. losartan-hydrochlorothiazide (HYZAAR) 100-12.5 MG per tablet Take 1 tablet by mouth daily.  90 tablet  3  . Olopatadine HCl (PATANASE) 0.6 % SOLN Place into the nose as needed.      . pantoprazole (PROTONIX) 40 MG tablet Take 1 tablet (40 mg total) by mouth 2 (two) times daily.  60 tablet  4  . SitaGLIPtin-MetFORMIN HCl (JANUMET XR) [phone] MG TB24 Take 1 tablet by mouth daily.  30 tablet  5  . zolpidem (AMBIEN) 5 MG tablet Take 5 mg by mouth at bedtime as needed.       No current facility-administered medications on file prior to visit.    Review of Systems Describes the breast lesion  as outlined.  Persistent intermittent flares.  Has been draining.  Is better now, but persistent lesion.  No fever.  Decreased pain.         Objective:   Physical Exam  Filed Vitals:   02/01/14 1205  BP: 120/88  Pulse: 84  Temp: 99 F (37.2 C)  Resp: 2716   33 year old female in no acute distress.  NECK:  Supple, nontender.    HEART:  Appears to be regular. LUNGS:  Without crackles or wheezing audible.  Respirations even and unlabored.   RADIAL PULSE:  Equal bilaterally.  BREASTS:  Left breast lesion.  Raised red - skin tag.  No significant tenderness to palpation.  No other nodules or adenopathy appreciated.                   Assessment & Plan:  HEALTH MAINTENANCE.  Breast/pap and pelvic through GYN.  Cholesterol 06/15/13 wnl.

## 2014-02-05 NOTE — Assessment & Plan Note (Addendum)
Persistent intermittent breast lesion.  Drains intermittently.  Has drained recently.  Is better, but persistent breast skin tag and lesion.  Will refer to surgery for further evaluation and treatment.  Bactrim as directed.

## 2014-02-13 ENCOUNTER — Ambulatory Visit (INDEPENDENT_AMBULATORY_CARE_PROVIDER_SITE_OTHER): Payer: 59 | Admitting: Internal Medicine

## 2014-02-13 ENCOUNTER — Encounter: Payer: Self-pay | Admitting: Internal Medicine

## 2014-02-13 VITALS — BP 130/80 | HR 87 | Temp 98.6°F | Ht 61.5 in | Wt 269.2 lb

## 2014-02-13 DIAGNOSIS — R809 Proteinuria, unspecified: Secondary | ICD-10-CM

## 2014-02-13 DIAGNOSIS — K219 Gastro-esophageal reflux disease without esophagitis: Secondary | ICD-10-CM

## 2014-02-13 DIAGNOSIS — N649 Disorder of breast, unspecified: Secondary | ICD-10-CM

## 2014-02-13 DIAGNOSIS — F439 Reaction to severe stress, unspecified: Secondary | ICD-10-CM

## 2014-02-13 DIAGNOSIS — E119 Type 2 diabetes mellitus without complications: Secondary | ICD-10-CM

## 2014-02-13 DIAGNOSIS — E559 Vitamin D deficiency, unspecified: Secondary | ICD-10-CM

## 2014-02-13 DIAGNOSIS — Z733 Stress, not elsewhere classified: Secondary | ICD-10-CM

## 2014-02-13 DIAGNOSIS — I10 Essential (primary) hypertension: Secondary | ICD-10-CM

## 2014-02-13 MED ORDER — SERTRALINE HCL 50 MG PO TABS: 50.0000 mg | ORAL_TABLET | Freq: Every day | ORAL | Status: DC

## 2014-02-13 MED ORDER — LORAZEPAM 0.5 MG PO TABS: ORAL_TABLET | ORAL | Status: DC

## 2014-02-13 NOTE — Progress Notes (Signed)
Pre-visit discussion using our clinic review tool. No additional management support is needed unless otherwise documented below in the visit note.  

## 2014-02-13 NOTE — Progress Notes (Signed)
Subjective:    Patient ID: Shirley [name], female    DOB: [birth year], 33 y.o.   MRN: [id number]  HPI 33 year old female with past history of hypertension, diabetes and GERD who comes in today to follow up on these issues as well as for a complete physical exam.  Seeing GI.  Just had gastric emptying study.  GI symptoms appear to be improved.  Sugars she reports are elevated.  Increased stress.  More now at work with her job change.  Feels this may be increasing her sugars.  Not exercising.  Discussed need for diet modification.  Feels she needs something to help with the increased stress.  She has seen Dr [place] Callas.  States she was diagnosed with asthma.  Feels her breathing is stable.  No sob.   No chest pain.  No increased cough or congestion.   Having her period today.  Unable to perform her pap.  Has been seeing gyn.  Has been on OCP's.  Does not feel any different on ocp's and this does not change her menstrual cycle.  Still with the nipple lesions and drainage.  Took abx.  Is smaller.       Past Medical History  Diagnosis Date  . Depression   . Diabetes mellitus   . Migraine   . Hypertension   . IBS (irritable bowel syndrome)   . GERD (gastroesophageal reflux disease)   . Anemia     Current Outpatient Prescriptions on File Prior to Visit  Medication Sig Dispense Refill  . albuterol (PROVENTIL HFA;VENTOLIN HFA) 108 (90 BASE) MCG/ACT inhaler Inhale into the lungs every 6 (six) hours as needed for wheezing or shortness of breath.      Marland Kitchen azelastine (OPTIVAR) 0.05 % ophthalmic solution 1 drop 2 (two) times daily.      . beclomethasone (QVAR) 40 MCG/ACT inhaler Inhale 1 puff into the lungs daily.      . Elastic Bandages & Supports (ELBOW BRACE) MISC Use elbow brace during the day. Remove at bedtime.  1 each  0  . levocetirizine (XYZAL) 5 MG tablet Take 5 mg by mouth every evening.      Marland Kitchen losartan-hydrochlorothiazide (HYZAAR) 100-12.5 MG per tablet Take 1 tablet by mouth daily.  90 tablet  3   . Olopatadine HCl (PATANASE) 0.6 % SOLN Place into the nose as needed.      . pantoprazole (PROTONIX) 40 MG tablet Take 1 tablet (40 mg total) by mouth 2 (two) times daily.  60 tablet  4  . SitaGLIPtin-MetFORMIN HCl (JANUMET XR) [phone] MG TB24 Take 1 tablet by mouth daily.  30 tablet  5  . sulfamethoxazole-trimethoprim (BACTRIM DS) 800-160 MG per tablet Take 1 tablet by mouth 2 (two) times daily.  14 tablet  0  . zolpidem (AMBIEN) 5 MG tablet Take 5 mg by mouth at bedtime as needed.       No current facility-administered medications on file prior to visit.    Review of Systems Patient denies any headache, lightheadedness or dizziness.  No significant allergy symptoms.   No chest pain, tightness or palpitations.  No cough or congestion.  No sob.  No vomiting.  No acid reflux.   No BRBPR or melana.  Bowels normal. Has had some abdominal discomfort as outlined in previous notes.  Better now.  Saw GI.  Refer to their note and previous note for details.   No urine change.  No vaginal complaints.  Sugars (she reports) elevated.  States Dr [place] Callas  diagnosed her with asthma.  Increased stress as outlined.  Planning a trip soon.  Needs some lorazepam prn travel.        Objective:   Physical Exam  Filed Vitals:   02/13/14 1544  BP: 130/80  Pulse: 87  Temp: 98.6 F (37 C)   Blood pressure recheck:  [id number]/[id number]  33 year old female in no acute distress.   HEENT:  Nares- clear.  Oropharynx - without lesions. NECK:  Supple.  Nontender.  No audible bruit.  HEART:  Appears to be regular. LUNGS:  No crackles or wheezing audible.  Respirations even and unlabored.  RADIAL PULSE:  Equal bilaterally.    BREASTS:  No nipple discharge or nipple retraction present.  Could not appreciate any distinct nodules or axillary adenopathy.  Persistent breast drainage/lesion - inner left breast.  No significant tenderness.  ABDOMEN:  Soft, nontender.  Bowel sounds present and normal.  No audible abdominal bruit.   GU:  Not performed.  Pt having her period.    EXTREMITIES:  No increased edema present.  DP pulses palpable and equal bilaterally.      FEET:  No lesions.                  Assessment & Plan:  HEALTH MAINTENANCE.  Physical today.  Will do a pap smear next visit.  Discussed nee for baseline mammogram.  Cholesterol 02/08/14 wnl.

## 2014-02-14 ENCOUNTER — Encounter: Payer: Self-pay | Admitting: Internal Medicine

## 2014-02-14 ENCOUNTER — Encounter: Payer: Self-pay | Admitting: General Surgery

## 2014-02-14 DIAGNOSIS — F439 Reaction to severe stress, unspecified: Secondary | ICD-10-CM | POA: Insufficient documentation

## 2014-02-14 NOTE — Assessment & Plan Note (Signed)
On protonix and zantac.  Follow.   

## 2014-02-14 NOTE — Assessment & Plan Note (Signed)
Continue supplementation.  Follow.  

## 2014-02-14 NOTE — Assessment & Plan Note (Signed)
Discussed with her regarding her sugars.  She will check and record sugars.  Follow.  Bring readings to next appt.  Recent a1c elevated 7.8.  Discussed importance of diet and exercise.  On Janumet XR 100/1000.  Has an appt scheduled with endocrinology soon.  Treat stress.  She feels is increasing her sugars.  Will hold on making adjustments in her medication.  Check and record sugars.  Work on diet and exercise.  Send in readings soon.  May need adjustments in her medication if no improvement.

## 2014-02-14 NOTE — Assessment & Plan Note (Signed)
Persistent intermittent breast lesion.  Drains intermittently.  Has drained recently.  Is better.  Treated with bactrim.  appt with Dr Lemar LivingsByrnett.  See last note.

## 2014-02-14 NOTE — Assessment & Plan Note (Signed)
Blood pressure as outlined.  Follow.  Continue same medication regimen.  Follow pressures and follow metabolic panel.

## 2014-02-14 NOTE — Assessment & Plan Note (Signed)
On losartan.  Follow renal function.   

## 2014-02-14 NOTE — Assessment & Plan Note (Signed)
Increased stress as outlined.  Discussed at length with her today.  No suicidal ideations.  Start zoloft 50mg  as directed.  Follow closely.  Get her back in soon to reassess.  Lorazepam prn travel.

## 2014-02-16 ENCOUNTER — Encounter: Payer: Self-pay | Admitting: Emergency Medicine

## 2014-02-22 ENCOUNTER — Ambulatory Visit: Payer: 59 | Admitting: Internal Medicine

## 2014-03-01 ENCOUNTER — Ambulatory Visit: Payer: Self-pay | Admitting: General Surgery

## 2014-03-08 ENCOUNTER — Encounter: Payer: Self-pay | Admitting: Internal Medicine

## 2014-03-08 ENCOUNTER — Ambulatory Visit (INDEPENDENT_AMBULATORY_CARE_PROVIDER_SITE_OTHER): Payer: 59 | Admitting: Internal Medicine

## 2014-03-08 VITALS — BP 124/68 | HR 95 | Temp 98.2°F | Resp 12 | Ht 62.0 in | Wt 273.0 lb

## 2014-03-08 DIAGNOSIS — E119 Type 2 diabetes mellitus without complications: Secondary | ICD-10-CM

## 2014-03-08 MED ORDER — METFORMIN HCL ER (OSM) 1000 MG PO TB24: 1000.0000 mg | ORAL_TABLET | Freq: Two times a day (BID) | ORAL | Status: DC

## 2014-03-08 MED ORDER — SITAGLIPTIN PHOSPHATE 100 MG PO TABS: 100.0000 mg | ORAL_TABLET | Freq: Every day | ORAL | Status: DC

## 2014-03-08 NOTE — Progress Notes (Signed)
Patient ID: Shirley [name], female   DOB: 21-Jun-1981, 33 y.o.   MRN: [id number]  HPI: Shirley CopasRaytarsha Huge is a 33 y.o.-year-old female, referred by her PCP, Dr. Lorin PicketScott, for management of DM2, non-insulin-dependent, uncontrolled, with complications (MAU).  Patient has been diagnosed with diabetes in 2005; she has not been on insulin before. Last hemoglobin A1c was: 01/2014: 7.8% 08/2013: 7.2% Lab Results  Component Value Date   HGBA1C 7.0* 04/22/2013   Pt is on a regimen of: - Janumet XR 1000-100 mg po with dinner (cuts them in half to swallow them better!!!) She was on Metformin in the past >> diarrhea at max dose  Pt checks her sugars 2x a day and they are: - am: 130-147 - 2h after b'fast: 120-130 - before lunch: n/c - 2h after lunch: 140-165 - before dinner: 170-200s (after popcorn) - 2h after dinner: n/c - bedtime: n/c - nighttime: n/c No lows. Lowest sugar was 98;? if  she has hypoglycemia awareness. Highest sugar was 200s. - 2x a week.  Pt's meals are: - Breakfast: sliced apples, 2 Nutrigrain bars - Lunch: leftovers from dinner; sandwich - Dinner: hamburgers; chicken + veggies + starch + cookie or cake on weekends - Snacks: popcorn; yoghurt ([place])  - no CKD, last BUN/creatinine normal in 08/2013, also:  Lab Results  Component Value Date   BUN 14 04/22/2013   CREATININE 0.86 04/22/2013  She is on Losartan. Last ACR: 30.1 in 07/21/2013 - No HL per her report - last eye exam was in 12/12/2013: [facility]. No DR.  - no numbness and tingling in her feet.  I reviewed her chart and she also has a history of vit D def., HTN, GERD. Had a recent normal gastric emptying study.   Pt has FH of DM in mother, MGM, PGM  ROS: Constitutional: no weight gain/loss, + fatigue, no subjective hyperthermia/hypothermia, + poor sleep Eyes: no blurry vision, no xerophthalmia ENT: no sore throat, no nodules palpated in throat, no dysphagia/odynophagia, no hoarseness Cardiovascular: no  CP/SOB/palpitations/+ leg swelling Respiratory: no cough/SOB Gastrointestinal: + N/no V/D/C Musculoskeletal: no muscle/joint aches Skin: no rashes Neurological: no tremors/numbness/tingling/dizziness Psychiatric: no depression/anxiety  Past Medical History  Diagnosis Date  . Depression   . Diabetes mellitus   . Migraine   . Hypertension   . IBS (irritable bowel syndrome)   . GERD (gastroesophageal reflux disease)   . Anemia    Past Surgical History  Procedure Laterality Date  . Cholecystectomy  2005  . 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 Marital Status: Single    Spouse Name: N/A    Number of Children: 0   Occupational History  . supervisor    Social History Main Topics  . Smoking status: Never Smoker   . Smokeless tobacco: Never Used  . Alcohol Use: No  . Drug Use: No   Current Outpatient Prescriptions on File Prior to Visit  Medication Sig Dispense Refill  . albuterol (PROVENTIL HFA;VENTOLIN HFA) 108 (90 BASE) MCG/ACT inhaler Inhale into the lungs every 6 (six) hours as needed for wheezing or shortness of breath.      Marland Kitchen. azelastine (OPTIVAR) 0.05 % ophthalmic solution 1 drop 2 (two) times daily.      . beclomethasone (QVAR) 40 MCG/ACT inhaler Inhale 1 puff into the lungs daily.      . Elastic Bandages & Supports (ELBOW BRACE) MISC Use elbow brace during the day. Remove at bedtime.  1  each  0  . levocetirizine (XYZAL) 5 MG tablet Take 5 mg by mouth every evening.      Marland Kitchen LORazepam (ATIVAN) 0.5 MG tablet 1/2 tablet q day prn  20 tablet  0  . losartan-hydrochlorothiazide (HYZAAR) 100-12.5 MG per tablet Take 1 tablet by mouth daily.  90 tablet  3  . Olopatadine HCl (PATANASE) 0.6 % SOLN Place into the nose as needed.      . pantoprazole (PROTONIX) 40 MG tablet Take 1 tablet (40 mg total) by mouth 2 (two) times daily.  60 tablet  4  . sertraline (ZOLOFT) 50 MG tablet Take 1 tablet (50 mg total) by mouth daily.  30 tablet  1  .  sulfamethoxazole-trimethoprim (BACTRIM DS) 800-160 MG per tablet Take 1 tablet by mouth 2 (two) times daily.  14 tablet  0  . zolpidem (AMBIEN) 5 MG tablet Take 5 mg by mouth at bedtime as needed.      . norethindrone-ethinyl estradiol-iron (MICROGESTIN FE,GILDESS FE,LOESTRIN FE) 1.5-30 MG-MCG tablet Take 1 tablet by mouth daily.       No current facility-administered medications on file prior to visit.   Allergies  Allergen Reactions  . Caffeine Other (See Comments)    Stomach issues  . Ibuprofen Other (See Comments)    Upset stomach   Family History  Problem Relation Age of Onset  . Hypertension Mother   . Diabetes Mother   . Asthma Brother   . Breast cancer Neg Hx   . Colon cancer Neg Hx    PE: BP 124/68  Pulse 95  Temp(Src) 98.2 F (36.8 C) (Oral)  Resp 12  Ht 5\' 2"  (1.575 m)  Wt 273 lb (123.832 kg)  BMI 49.92 kg/m2  SpO2 98%  LMP 02/10/2014 Wt Readings from Last 3 Encounters:  03/08/14 273 lb (123.832 kg)  02/13/14 269 lb 4 oz (122.131 kg)  02/01/14 271 lb (122.925 kg)   Constitutional: obese class 3 - see below, in NAD Eyes: PERRLA, EOMI, no exophthalmos ENT: moist mucous membranes, no thyromegaly, no cervical lymphadenopathy Cardiovascular: RRR, No MRG Respiratory: CTA B Gastrointestinal: abdomen soft, NT, ND, BS+ Musculoskeletal: no deformities, strength intact in all 4 Skin: moist, warm, no rashes, acanthosis nigricans on neck; hirsutism face, chest Neurological: no tremor with outstretched hands, DTR normal in all 4  BMI Classification:  < 18.5 underweight   18.5-24.9 normal weight   25.0-29.9 overweight   30.0-34.9 class I obesity   35.0-39.9 class II obesity   ? 40.0 class III obesity    ASSESSMENT: 1. DM2, non-insulin-dependent, uncontrolled, without complications - MAU  PLAN:  1. Patient with long-standing, recently more uncontrolled diabetes, on oral antidiabetic regimen, which became insufficient - We discussed about options for  treatment, and I suggested to:  Patient Instructions  Please stop Janumet XR and start: - Januvia 100 mg in am - Metformin XR 1000 mg 2x a day with meals Please return in 1.5 month with your sugar log.  - explained why she should not cut XR pills in half. Since Janumet is too large >> will split regimen into Januvia and Metformin XR - continue checking sugars at different times of the day - check 2 times a day, rotating checks - given sugar log and advised how to fill it and to bring it at next appt  - given foot care handout and explained the principles  - given instructions for hypoglycemia management "15-15 rule"  - advised for yearly eye exams - Return to  clinic in 1.5 mo with sugar log

## 2014-03-08 NOTE — Patient Instructions (Signed)
Please stop Janumet XR and start: - Januvia 100 mg in am - Metformin XR 1000 mg 2x a day with meals  Please return in 1.5 month with your sugar log.   PATIENT INSTRUCTIONS FOR TYPE 2 DIABETES:  DIET AND EXERCISE Diet and exercise is an important part of diabetic treatment.  We recommended aerobic exercise in the form of brisk walking (working between 40-60% of maximal aerobic capacity, similar to brisk walking) for 150 minutes per week (such as 30 minutes five days per week) along with 3 times per week performing 'resistance' training (using various gauge rubber tubes with handles) 5-10 exercises involving the major muscle groups (upper body, lower body and core) performing 10-15 repetitions (or near fatigue) each exercise. Start at half the above goal but build slowly to reach the above goals. If limited by weight, joint pain, or disability, we recommend daily walking in a swimming pool with water up to waist to reduce pressure from joints while allow for adequate exercise.    BLOOD GLUCOSES Monitoring your blood glucoses is important for continued management of your diabetes. Please check your blood glucoses 2-4 times a day: fasting, before meals and at bedtime (you can rotate these measurements - e.g. one day check before the 3 meals, the next day check before 2 of the meals and before bedtime, etc.   HYPOGLYCEMIA (low blood sugar) Hypoglycemia is usually a reaction to not eating, exercising, or taking too much insulin/ other diabetes drugs.  Symptoms include tremors, sweating, hunger, confusion, headache, etc. Treat IMMEDIATELY with 15 grams of Carbs:   4 glucose tablets    cup regular juice/soda   2 tablespoons raisins   4 teaspoons sugar   1 tablespoon honey Recheck blood glucose in 15 mins and repeat above if still symptomatic/blood glucose <100. Please contact our office at [phone] if you have questions about how to next handle your insulin.  RECOMMENDATIONS TO REDUCE YOUR  RISK OF DIABETIC COMPLICATIONS: * Take your prescribed MEDICATION(S). * Follow a DIABETIC diet: Complex carbs, fiber rich foods, heart healthy fish twice weekly, (monounsaturated and polyunsaturated) fats * AVOID saturated/trans fats, high fat foods, >2,300 mg salt per day. * EXERCISE at least 5 times a week for 30 minutes or preferably daily.  * DO NOT SMOKE OR DRINK more than 1 drink a day. * Check your FEET every day. Do not wear tightfitting shoes. Contact us if you develop an ulcer * See your EYE doctor once a year or more if needed * Get a FLU shot once a year * Get a PNEUMONIA vaccine once before and once after age 665 years  GOALS:  * Your Hemoglobin A1c of <7%  * fasting sugars need to be <130 * after meals sugars need to be <180 (2h after you start eating) * Your Systolic BP should be 140 or lower  * Your Diastolic BP should be 80 or lower  * Your HDL (Good Cholesterol) should be 40 or higher  * Your LDL (Bad Cholesterol) should be 100 or lower  * Your Triglycerides should be 150 or lower  * Your Urine microalbumin (kidney function) should be <30 * Your Body Mass Index should be 25 or lower   We will be glad to help you achieve these goals. Our telephone number is: [phone].

## 2014-03-14 ENCOUNTER — Encounter: Payer: Self-pay | Admitting: Internal Medicine

## 2014-03-27 ENCOUNTER — Ambulatory Visit: Payer: 59 | Admitting: Internal Medicine

## 2014-03-30 ENCOUNTER — Encounter: Payer: Self-pay | Admitting: Internal Medicine

## 2014-04-11 ENCOUNTER — Other Ambulatory Visit: Payer: Self-pay | Admitting: *Deleted

## 2014-04-11 MED ORDER — PANTOPRAZOLE SODIUM 40 MG PO TBEC: 40.0000 mg | DELAYED_RELEASE_TABLET | Freq: Two times a day (BID) | ORAL | Status: DC

## 2014-04-19 ENCOUNTER — Ambulatory Visit (INDEPENDENT_AMBULATORY_CARE_PROVIDER_SITE_OTHER): Payer: 59 | Admitting: Internal Medicine

## 2014-04-19 ENCOUNTER — Encounter: Payer: Self-pay | Admitting: Internal Medicine

## 2014-04-19 VITALS — BP 120/76 | HR 84 | Temp 98.8°F | Wt 271.5 lb

## 2014-04-19 DIAGNOSIS — E119 Type 2 diabetes mellitus without complications: Secondary | ICD-10-CM

## 2014-04-19 MED ORDER — CANAGLIFLOZIN 100 MG PO TABS
ORAL_TABLET | ORAL | Status: DC
Start: 2014-04-19 — End: 2014-08-22

## 2014-04-19 NOTE — Progress Notes (Signed)
Pre visit review using our clinic review tool, if applicable. No additional management support is needed unless otherwise documented below in the visit note. 

## 2014-04-19 NOTE — Progress Notes (Signed)
Patient ID: Shirley [name], female   DOB: 1981/06/07, 33 y.o.   MRN: [id number]  HPI: Shirley [name] is a 33 y.o.-year-old female, returning for f/u for DM2, dx 2005, non-insulin-dependent, uncontrolled, with complications (MAU). Last visit 1 mo ago  Last hemoglobin A1c was: 01/2014: 7.8% 08/2013: 7.2% Lab Results  Component Value Date   HGBA1C 7.0* 04/22/2013   Pt is on a regimen of: - Januvia 100 mg in am - Metformin XR (osm) 1000 mg tabs 2x a day with meals She was on Janumet XR 1000-100 mg po with dinner (used to cut them in half to swallow them better!!!) She was on Metformin in the past >> diarrhea at max dose  Pt checks her sugars 2x a day and they are: - am: 130-147 >> 122-165 (1x 188) - 2h after b'fast: 120-130 >> 113-171 (1x 207) - before lunch: n/c - 2h after lunch: 140-165 >> 127-199 (1x 213) - before dinner: 170-200s (after popcorn) >> 93-126  - 2h after dinner: n/c - bedtime: n/c - nighttime: n/c  No lows. Lowest sugar was 96;? if  she has hypoglycemia awareness. Highest sugar was 200s. - 2x a week.  Pt's meals are: - Breakfast: sliced apples, 2 Nutrigrain bars - Lunch: leftovers from dinner; sandwich - Dinner: hamburgers; chicken + veggies + starch + cookie or cake on weekends - Snacks: yoghurt ([place]). No more popcorn >> sugars better at dinnertime.  - no CKD, last BUN/creatinine normal in 08/2013, also:  Lab Results  Component Value Date   BUN 14 04/22/2013   CREATININE 0.86 04/22/2013  She is on Losartan. Last ACR: 30.1 in 07/21/2013 - No HL per her report - last eye exam was in 12/12/2013: [facility]. No DR.  - no numbness and tingling in her feet.  She also has a history of vit D def., HTN, GERD. Had a normal gastric emptying study.   I reviewed pt's medications, allergies, PMH, social hx, family hx and no changes required, except as mentioned above.  ROS: Constitutional: no weight gain/loss, + fatigue, no subjective hyperthermia/hypothermia, +  poor sleep Eyes: no blurry vision, no xerophthalmia ENT: no sore throat, no nodules palpated in throat, no dysphagia/odynophagia, no hoarseness Cardiovascular: no CP/SOB/palpitations/+ leg swelling Respiratory: no cough/SOB Gastrointestinal: + N/no V/D/C Musculoskeletal: no muscle/+ joint aches Skin: no rashes Neurological: no tremors/numbness/tingling/dizziness  PE: BP 120/76  Pulse 84  Temp(Src) 98.8 F (37.1 C) (Oral)  Wt 271 lb 8 oz (123.152 kg)  LMP 03/24/2014 Wt Readings from Last 3 Encounters:  04/19/14 271 lb 8 oz (123.152 kg)  03/08/14 273 lb (123.832 kg)  02/13/14 269 lb 4 oz (122.131 kg)   Constitutional: obese class 3 - see below, in NAD Eyes: PERRLA, EOMI, no exophthalmos ENT: moist mucous membranes, no thyromegaly, no cervical lymphadenopathy Cardiovascular: RRR, No MRG Respiratory: CTA B Gastrointestinal: abdomen soft, NT, ND, BS+ Musculoskeletal: no deformities, strength intact in all 4 Skin: moist, warm, no rashes, acanthosis nigricans on neck; hirsutism face, chest Neurological: no tremor with outstretched hands, DTR normal in all 4  BMI Classification:  < 18.5 underweight   18.5-24.9 normal weight   25.0-29.9 overweight   30.0-34.9 class I obesity   35.0-39.9 class II obesity   ? 40.0 class III obesity    ASSESSMENT: 1. DM2, non-insulin-dependent, uncontrolled, without complications - MAU  PLAN:  1. Patient with long-standing, recently more uncontrolled diabetes, on oral antidiabetic regimen, with improved control but higher sugars in am - We discussed about options for  treatment, and I suggested to start Invokana 100 mg daily:  Patient Instructions  Please continue; - Januvia 100 mg in am - Metformin XR (osm) 1000 mg 2x a day with meals Add: - Invokana 100 mg in am Please return in 1.5 month with your sugar log.  - continue checking sugars at different times of the day - check 2 times a day, rotating checks - given discount card and  samples of Invokana - we discussed about SEs of Invokana, which are: dizziness (advised to be careful when stands from sitting position), decreased BP - usually not < normal (BP today is not low), and fungal UTIs (advised to let me know if develops one). - up to date with yearly eye exams - Return to clinic in 1.5 mo with sugar log

## 2014-04-19 NOTE — Patient Instructions (Signed)
Please continue; - Januvia 100 mg in am - Metformin XR (osm) 1000 mg 2x a day with meals Add: - Invokana 100 mg in am  Please return in 1.5 month with your sugar log.

## 2014-05-31 ENCOUNTER — Encounter: Payer: Self-pay | Admitting: Internal Medicine

## 2014-06-01 ENCOUNTER — Telehealth: Payer: Self-pay | Admitting: *Deleted

## 2014-06-01 NOTE — Telephone Encounter (Signed)
Pt sent a message through MyChart, they would like to schedule an OV with Dr Elvera LennoxGherghe. Please call pt and schedule. Thanks.

## 2014-06-08 ENCOUNTER — Other Ambulatory Visit: Payer: Self-pay | Admitting: *Deleted

## 2014-06-08 MED ORDER — SITAGLIPTIN PHOSPHATE 100 MG PO TABS: 100.0000 mg | ORAL_TABLET | Freq: Every day | ORAL | Status: DC

## 2014-06-08 MED ORDER — LOSARTAN POTASSIUM-HCTZ 100-12.5 MG PO TABS: 1.0000 | ORAL_TABLET | Freq: Every day | ORAL | Status: DC

## 2014-06-12 ENCOUNTER — Other Ambulatory Visit: Payer: Self-pay | Admitting: *Deleted

## 2014-06-12 MED ORDER — LOSARTAN POTASSIUM-HCTZ 100-12.5 MG PO TABS: 1.0000 | ORAL_TABLET | Freq: Every day | ORAL | Status: DC

## 2014-06-13 ENCOUNTER — Encounter: Payer: Self-pay | Admitting: Internal Medicine

## 2014-06-13 ENCOUNTER — Ambulatory Visit (INDEPENDENT_AMBULATORY_CARE_PROVIDER_SITE_OTHER): Payer: 59 | Admitting: Internal Medicine

## 2014-06-13 VITALS — BP 130/82 | HR 96 | Temp 98.7°F | Ht 61.5 in | Wt 269.0 lb

## 2014-06-13 DIAGNOSIS — I10 Essential (primary) hypertension: Secondary | ICD-10-CM

## 2014-06-13 DIAGNOSIS — E119 Type 2 diabetes mellitus without complications: Secondary | ICD-10-CM

## 2014-06-13 DIAGNOSIS — M79609 Pain in unspecified limb: Secondary | ICD-10-CM

## 2014-06-13 DIAGNOSIS — M79605 Pain in left leg: Principal | ICD-10-CM

## 2014-06-13 DIAGNOSIS — M79604 Pain in right leg: Secondary | ICD-10-CM

## 2014-06-13 NOTE — Progress Notes (Signed)
Pre visit review using our clinic review tool, if applicable. No additional management support is needed unless otherwise documented below in the visit note. 

## 2014-06-18 ENCOUNTER — Encounter: Payer: Self-pay | Admitting: Internal Medicine

## 2014-06-18 DIAGNOSIS — M79605 Pain in left leg: Principal | ICD-10-CM

## 2014-06-18 DIAGNOSIS — M79604 Pain in right leg: Secondary | ICD-10-CM | POA: Insufficient documentation

## 2014-06-18 NOTE — Progress Notes (Signed)
Subjective:    Patient ID: Shirley [name], female    DOB: June 07, 1981, 33 y.o.   MRN: [id number]  HPI 33 year old female with past history of hypertension, diabetes and GERD who comes in today as a work in with concerns regarding leg pain.  States has a new dress code.  States has to wear dress shoes.  Reports her legs hurt more when she is not wearing support shoes.  Involves both feet and both legs.  Pain is better when she wears support tennis shoes.     Past Medical History  Diagnosis Date  . Depression   . Diabetes mellitus   . Migraine   . Hypertension   . IBS (irritable bowel syndrome)   . GERD (gastroesophageal reflux disease)   . Anemia     Current Outpatient Prescriptions on File Prior to Visit  Medication Sig Dispense Refill  . albuterol (PROVENTIL HFA;VENTOLIN HFA) 108 (90 BASE) MCG/ACT inhaler Inhale into the lungs every 6 (six) hours as needed for wheezing or shortness of breath.      Marland Kitchen. azelastine (OPTIVAR) 0.05 % ophthalmic solution 1 drop 2 (two) times daily.      . beclomethasone (QVAR) 40 MCG/ACT inhaler Inhale 1 puff into the lungs daily.      . Canagliflozin 100 MG TABS Take 1 tab by mouth in am  30 tablet  3  . Elastic Bandages & Supports (ELBOW BRACE) MISC Use elbow brace during the day. Remove at bedtime.  1 each  0  . levocetirizine (XYZAL) 5 MG tablet Take 5 mg by mouth every evening.      Marland Kitchen. LORazepam (ATIVAN) 0.5 MG tablet 1/2 tablet q day prn  20 tablet  0  . losartan-hydrochlorothiazide (HYZAAR) 100-12.5 MG per tablet Take 1 tablet by mouth daily.  90 tablet  1  . metformin (FORTAMET) 1000 MG (OSM) 24 hr tablet Take 1 tablet (1,000 mg total) by mouth 2 (two) times daily with a meal.  60 tablet  11  . Olopatadine HCl (PATANASE) 0.6 % SOLN Place into the nose as needed.      . pantoprazole (PROTONIX) 40 MG tablet Take 1 tablet (40 mg total) by mouth 2 (two) times daily.  180 tablet  1  . sitaGLIPtin (JANUVIA) 100 MG tablet Take 1 tablet (100 mg total) by  mouth daily.  90 tablet  2  . zolpidem (AMBIEN) 5 MG tablet Take 5 mg by mouth at bedtime as needed.       No current facility-administered medications on file prior to visit.    Review of Systems  No chest pain, tightness or palpitations.  No sob.  No vomiting.  No acid reflux.  Leg pain as outlined.  Better if wears support shoes/tennis shoes.        Objective:   Physical Exam  Filed Vitals:   06/13/14 1446  BP: 130/82  Pulse: 96  Temp: 98.7 F (2937.61 C)   33 year old female in no acute distress.  NECK:  Supple.  Non tender. HEART:  Appears to be regular. LUNGS:  No crackles or wheezing audible.  Respirations even and unlabored.  RADIAL PULSE:  Equal bilaterally.  ABDOMEN:  Soft, nontender.  Bowel sounds present and normal.  No audible abdominal bruit.    EXTREMITIES:  No increased edema present.  DP pulses palpable and equal bilaterally.  No pain with palpation over the lower extremities.      FEET:  No lesions.  Assessment & Plan:  HEALTH MAINTENANCE.  Physical last visit.  Will do a pap smear next visit.  Discussed need for baseline mammogram.  Cholesterol 02/08/14 wnl.

## 2014-06-18 NOTE — Assessment & Plan Note (Signed)
Seeing Dr Wyonia HoughGerghe.  Follow.

## 2014-06-18 NOTE — Assessment & Plan Note (Signed)
Blood pressure as outlined.  Follow.  Continue same medication regimen.  Follow pressures and follow metabolic panel.

## 2014-06-18 NOTE — Assessment & Plan Note (Signed)
Persistent intermittent leg pain.  Better if wears tennis shoes and support shoes.  Will notify her work.  Needs note.

## 2014-07-01 LAB — BASIC METABOLIC PANEL
BUN: 11 mg/dL (ref 4–21)
Creatinine: 0.8 mg/dL (ref 0.5–1.1)
Glucose: 116 mg/dL
Potassium: 4.1 mmol/L (ref 3.4–5.3)
Sodium: 136 mmol/L — AB (ref 137–147)

## 2014-07-01 LAB — CBC AND DIFFERENTIAL
HCT: 39 % (ref 36–46)
Hemoglobin: 12.8 g/dL (ref 12.0–16.0)
Neutrophils Absolute: 3 /uL
Platelets: 264 10*3/uL (ref 150–399)
WBC: 6.8 10^3/mL

## 2014-07-01 LAB — LIPID PANEL
Cholesterol: 158 mg/dL (ref 0–200)
HDL: 54 mg/dL (ref 35–70)
LDL Cholesterol: 85 mg/dL
Triglycerides: 97 mg/dL (ref 40–160)

## 2014-07-01 LAB — HEPATIC FUNCTION PANEL
ALT: 14 U/L (ref 7–35)
AST: 13 U/L (ref 13–35)
Alkaline Phosphatase: 89 U/L (ref 25–125)
Bilirubin, Direct: 0.21 mg/dL (ref 0.01–0.4)
Bilirubin, Total: 0.8 mg/dL

## 2014-07-01 LAB — TSH: TSH: 1.56 u[IU]/mL (ref 0.41–5.90)

## 2014-07-01 LAB — HEMOGLOBIN A1C: Hgb A1c MFr Bld: 7.5 % — AB (ref 4.0–6.0)

## 2014-07-04 ENCOUNTER — Encounter: Payer: Self-pay | Admitting: Internal Medicine

## 2014-07-04 ENCOUNTER — Other Ambulatory Visit: Payer: Self-pay | Admitting: Internal Medicine

## 2014-07-05 ENCOUNTER — Telehealth: Payer: Self-pay | Admitting: Internal Medicine

## 2014-07-05 NOTE — Telephone Encounter (Signed)
Pt notified of lab results via my cart.  F/u with Dr Elvera LennoxGherghe.

## 2014-07-07 ENCOUNTER — Encounter: Payer: Self-pay | Admitting: Internal Medicine

## 2014-07-07 ENCOUNTER — Ambulatory Visit (INDEPENDENT_AMBULATORY_CARE_PROVIDER_SITE_OTHER): Payer: 59 | Admitting: Internal Medicine

## 2014-07-07 VITALS — BP 118/80 | HR 85 | Temp 98.4°F | Ht 61.5 in | Wt 267.5 lb

## 2014-07-07 DIAGNOSIS — E119 Type 2 diabetes mellitus without complications: Secondary | ICD-10-CM

## 2014-07-07 DIAGNOSIS — R21 Rash and other nonspecific skin eruption: Secondary | ICD-10-CM

## 2014-07-07 LAB — HM DIABETES EYE EXAM

## 2014-07-07 MED ORDER — TRIAMCINOLONE ACETONIDE 0.1 % EX CREA: 1.0000 "application " | TOPICAL_CREAM | Freq: Two times a day (BID) | CUTANEOUS | Status: DC

## 2014-07-09 ENCOUNTER — Encounter: Payer: Self-pay | Admitting: Internal Medicine

## 2014-07-09 DIAGNOSIS — R21 Rash and other nonspecific skin eruption: Secondary | ICD-10-CM | POA: Insufficient documentation

## 2014-07-09 NOTE — Assessment & Plan Note (Signed)
She request referral back to Dr Elvera LennoxGherghe.

## 2014-07-09 NOTE — Assessment & Plan Note (Signed)
Unclear etiology.  Question of contact dermatitis.  Prescribe triamcinolone cream .1%. Continue xyzal.  Benadryl in the pm if needed.  Call with update.  May need dermatology referral.

## 2014-07-09 NOTE — Progress Notes (Signed)
Subjective:    Patient ID: Shirley [name], female    DOB: 31-Dec-1980, 33 y.o.   MRN: [id number]  Rash  33 year old female with past history of hypertension, diabetes and GERD who comes in today as a work in with concerns regarding a persistent rash.  States was working in her yard a few weeks ago.  Then went to a cook out.  She developed itching.  Then noticed a rash left forearm and right hand.  These areas have cleared,but now has rash noticed over other parts of her arm, back and stomach and right thigh.  Itches.  Has been using some otc cortisone cream.  Takes xyzal daily.  No fever.  No chills.  No nausea or vomiting.        Past Medical History  Diagnosis Date  . Depression   . Diabetes mellitus   . Migraine   . Hypertension   . IBS (irritable bowel syndrome)   . GERD (gastroesophageal reflux disease)   . Anemia     Current Outpatient Prescriptions on File Prior to Visit  Medication Sig Dispense Refill  . albuterol (PROVENTIL HFA;VENTOLIN HFA) 108 (90 BASE) MCG/ACT inhaler Inhale into the lungs every 6 (six) hours as needed for wheezing or shortness of breath.      Marland Kitchen. azelastine (OPTIVAR) 0.05 % ophthalmic solution 1 drop 2 (two) times daily.      Marland Kitchen. BAYER CONTOUR NEXT TEST test strip       . beclomethasone (QVAR) 40 MCG/ACT inhaler Inhale 1 puff into the lungs daily.      . Canagliflozin 100 MG TABS Take 1 tab by mouth in am  30 tablet  3  . Elastic Bandages & Supports (ELBOW BRACE) MISC Use elbow brace during the day. Remove at bedtime.  1 each  0  . levocetirizine (XYZAL) 5 MG tablet Take 5 mg by mouth every evening.      Marland Kitchen. LORazepam (ATIVAN) 0.5 MG tablet 1/2 tablet q day prn  20 tablet  0  . losartan-hydrochlorothiazide (HYZAAR) 100-12.5 MG per tablet Take 1 tablet by mouth daily.  90 tablet  1  . metformin (FORTAMET) 1000 MG (OSM) 24 hr tablet Take 1 tablet (1,000 mg total) by mouth 2 (two) times daily with a meal.  60 tablet  11  . Olopatadine HCl (PATANASE) 0.6 % SOLN  Place into the nose as needed.      . pantoprazole (PROTONIX) 40 MG tablet Take 1 tablet (40 mg total) by mouth 2 (two) times daily.  180 tablet  1  . sitaGLIPtin (JANUVIA) 100 MG tablet Take 1 tablet (100 mg total) by mouth daily.  90 tablet  2  . zolpidem (AMBIEN) 5 MG tablet Take 5 mg by mouth at bedtime as needed.       No current facility-administered medications on file prior to visit.    Review of Systems  Skin: Positive for rash.  No sob.  No vomiting.  No acid reflux.  Rash as outlined.  Itching.  On xyzal.  Using otc cortisone cream.  Spreading.        Objective:   Physical Exam  Filed Vitals:   07/07/14 1055  BP: 118/80  Pulse: 85  Temp: 98.4 F (6236.1039 C)   33 year old female in no acute distress.  NECK:  Supple.  Non tender. HEART:  Appears to be regular. LUNGS:  No crackles or wheezing audible.  Respirations even and unlabored.  SKIN:  Rash noted  on arms, right thigh, stomach and back.                    Assessment & Plan:

## 2014-07-14 ENCOUNTER — Encounter: Payer: Self-pay | Admitting: Internal Medicine

## 2014-07-19 ENCOUNTER — Encounter: Payer: Self-pay | Admitting: Internal Medicine

## 2014-07-20 ENCOUNTER — Ambulatory Visit: Payer: 59 | Admitting: Internal Medicine

## 2014-07-28 ENCOUNTER — Other Ambulatory Visit: Payer: 59

## 2014-08-01 ENCOUNTER — Ambulatory Visit (INDEPENDENT_AMBULATORY_CARE_PROVIDER_SITE_OTHER): Payer: 59 | Admitting: Internal Medicine

## 2014-08-01 ENCOUNTER — Encounter: Payer: Self-pay | Admitting: Internal Medicine

## 2014-08-01 VITALS — BP 128/78 | HR 87 | Temp 98.8°F | Resp 12 | Wt 265.0 lb

## 2014-08-01 DIAGNOSIS — E119 Type 2 diabetes mellitus without complications: Secondary | ICD-10-CM

## 2014-08-01 MED ORDER — METFORMIN HCL ER (OSM) 1000 MG PO TB24: 1000.0000 mg | ORAL_TABLET | Freq: Two times a day (BID) | ORAL | Status: DC

## 2014-08-01 NOTE — Progress Notes (Signed)
Patient ID: Shirley [name], female   DOB: December 13, 1980, 33 y.o.   MRN: [id number]  HPI: Shirley [name] is a 33 y.o.-year-old female, returning for f/u for DM2, dx 2005, non-insulin-dependent, uncontrolled, with complications (MAU). Last visit 3 mo ago.  She stopped taking her Metformin XR for at least 1 mo in June. She also stopped Invokana for 2 weeks. She continued Januvia. She restarted all of them at the end of July. She also restarted checking her sugars then.  Last hemoglobin A1c was: 07/13/2014: 7.2%  - at work Lab Results  Component Value Date   HGBA1C 7.5* 07/01/2014   HGBA1C 7.0* 04/22/2013  01/2014: 7.8% 08/2013: 7.2%  Pt is on a regimen of: - Januvia 100 mg in am - Metformin XR (osm) 1000 mg tabs 2x a day with meals >> some loose stools - Invokana 100 - added 03/2014 She was on Janumet XR 1000-100 mg po with dinner (used to cut them in half to swallow them better!!!) She was on Metformin in the past >> diarrhea at max dose  Pt checks her sugars 2-4x a day and they are: - am: 130-147 >> 122-165 (1x 188) >> 120-142 - 2h after b'fast: 120-130 >> 113-171 (1x 207) >> 98-164, 187 x1 - before lunch: n/c - 2h after lunch: 140-165 >> 127-199 (1x 213) >> 101-177 - before dinner: 170-200s (after popcorn) >> 93-126 >> 120-141, 195 x1 - 2h after dinner: n/c - bedtime: n/c - nighttime: n/c No lows. Lowest sugar was 98;? if  she has hypoglycemia awareness. Highest sugar was 195x1.  Pt's meals are: - Breakfast: sliced apples, 2 Nutrigrain bars - Lunch: leftovers from dinner; sandwich - Dinner: hamburgers; chicken + veggies + starch + cookie or cake on weekends - Snacks: yoghurt ([place]). No more popcorn >> sugars better at dinnertime.  - no CKD, last BUN/creatinine normal in 08/2013, also:  Lab Results  Component Value Date   BUN 11 07/01/2014   CREATININE 0.8 07/01/2014  She is on Losartan. Last ACR: 30.1 in 07/21/2013 - No HL: Lab Results  Component Value Date   CHOL 158  07/01/2014   HDL 54 07/01/2014   LDLCALC 85 07/01/2014   TRIG 97 07/01/2014  - last eye exam was in 12/12/2013: [facility] No DR. She got new glasses recently. - no numbness and tingling in her feet.  She also has a history of vit D def., HTN, GERD. Had a normal gastric emptying study.   I reviewed pt's medications, allergies, PMH, social hx, family hx and no changes required, except as mentioned above.  ROS: Constitutional: + weight loss, + fatigue, no subjective hyperthermia/hypothermia Eyes: no blurry vision, no xerophthalmia ENT: no sore throat, no nodules palpated in throat, no dysphagia/odynophagia, no hoarseness Cardiovascular: no CP/SOB/palpitations/+ leg swelling Respiratory: no cough/SOB Gastrointestinal: no N/V/D/C Musculoskeletal: no muscle/+ joint aches Skin: no rashes Neurological: no tremors/numbness/tingling/dizziness  PE: BP 128/78  Pulse 87  Temp(Src) 98.8 F (37.1 C) (Oral)  Resp 12  Wt 265 lb (120.203 kg)  SpO2 98% Wt Readings from Last 3 Encounters:  08/01/14 265 lb (120.203 kg)  07/07/14 267 lb 8 oz (121.337 kg)  06/13/14 269 lb (122.018 kg)   Constitutional: obese class 3 - see below, in NAD Eyes: PERRLA, EOMI, no exophthalmos ENT: moist mucous membranes, no thyromegaly, no cervical lymphadenopathy Cardiovascular: RRR, No MRG Respiratory: CTA B Gastrointestinal: abdomen soft, NT, ND, BS+ Musculoskeletal: no deformities, strength intact in all 4 Skin: moist, warm, no rashes, acanthosis  nigricans on neck; hirsutism face, chest Neurological: no tremor with outstretched hands, DTR normal in all 4   ASSESSMENT: 1. DM2, non-insulin-dependent, uncontrolled, without complications - MAU  PLAN:  1. Patient with long-standing, fairly well controlled diabetes, on oral antidiabetic regimen, with improved control after she restarted back all her meds in last 1.5 months. - We discussed about options for treatment, and I suggested to continue current regimen:   Patient Instructions  Please continue: - Januvia 100 mg in am - Metformin XR (osm) 1000 mg tabs 2x a day with meals  - Invokana 100 mg in am Please return in 3 months with your sugar log.  - continue checking sugars at different times of the day - check 2 times a day, rotating checks >> advised her to start checking 2h after dinner and at bedtime  - up to date with yearly eye exams - Return to clinic in 3 mo with sugar log

## 2014-08-01 NOTE — Patient Instructions (Signed)
Please continue: - Januvia 100 mg in am - Metformin XR (osm) 1000 mg tabs 2x a day with meals  - Invokana 100 mg in am  Please return in 3 months with your sugar log.   

## 2014-08-20 ENCOUNTER — Encounter: Payer: Self-pay | Admitting: Internal Medicine

## 2014-08-21 ENCOUNTER — Encounter: Payer: Self-pay | Admitting: Internal Medicine

## 2014-08-22 ENCOUNTER — Other Ambulatory Visit: Payer: Self-pay | Admitting: *Deleted

## 2014-08-22 MED ORDER — CANAGLIFLOZIN 100 MG PO TABS: ORAL_TABLET | ORAL | Status: DC

## 2014-08-28 ENCOUNTER — Telehealth: Payer: Self-pay | Admitting: *Deleted

## 2014-08-28 NOTE — Telephone Encounter (Signed)
Pt had dropped off form for [facility]. Dr. Lorin PicketScott asked if patient could come in on Friday @ 2:00. Pt verbalized understanding.

## 2014-09-01 ENCOUNTER — Ambulatory Visit (INDEPENDENT_AMBULATORY_CARE_PROVIDER_SITE_OTHER): Payer: 59 | Admitting: Internal Medicine

## 2014-09-01 ENCOUNTER — Encounter: Payer: Self-pay | Admitting: Internal Medicine

## 2014-09-01 VITALS — BP 120/70 | HR 80 | Temp 98.7°F | Ht 61.5 in | Wt 265.2 lb

## 2014-09-01 DIAGNOSIS — E119 Type 2 diabetes mellitus without complications: Secondary | ICD-10-CM

## 2014-09-01 DIAGNOSIS — H9209 Otalgia, unspecified ear: Secondary | ICD-10-CM

## 2014-09-01 DIAGNOSIS — Z23 Encounter for immunization: Secondary | ICD-10-CM

## 2014-09-01 NOTE — Progress Notes (Signed)
Pre visit review using our clinic review tool, if applicable. No additional management support is needed unless otherwise documented below in the visit note. 

## 2014-09-04 ENCOUNTER — Encounter: Payer: Self-pay | Admitting: Internal Medicine

## 2014-09-04 DIAGNOSIS — H9209 Otalgia, unspecified ear: Secondary | ICD-10-CM | POA: Insufficient documentation

## 2014-09-04 NOTE — Assessment & Plan Note (Signed)
Ear pain and fullness as outlined.  No pain today.  Exam unrevealing of a cause.  Discussed ENT referral.  She declines.  Will follow.

## 2014-09-04 NOTE — Assessment & Plan Note (Signed)
Seeing Dr Elvera LennoxGherghe.  Sugars as outlined.

## 2014-09-04 NOTE — Progress Notes (Signed)
Subjective:    Patient ID: Shirley [name], female    DOB: [birth year], 33 y.o.   MRN: [id number]  Otalgia   33 year old female with past history of hypertension, diabetes and GERD who comes in today as a work in to have a work form completed and to discuss weight loss.  We discussed diet and exercise today.  Discussed cutting down on bread, etc.  We discussed low carb diet.  She also was concerned regarding some intermittent ear pain.  States right ear feels stopped up.  Left ear - will occasionally notice a sharp pain.  Sore under the ear.  No headache.  No increased sinus congestion or pressure.  No increased nasal congestion.  States her blood sugars are averaging 113-120 in the am.  States average upper 140s.  Seeing endocrinology.       Past Medical History  Diagnosis Date  . Depression   . Diabetes mellitus   . Migraine   . Hypertension   . IBS (irritable bowel syndrome)   . GERD (gastroesophageal reflux disease)   . Anemia     Current Outpatient Prescriptions on File Prior to Visit  Medication Sig Dispense Refill  . albuterol (PROVENTIL HFA;VENTOLIN HFA) 108 (90 BASE) MCG/ACT inhaler Inhale into the lungs every 6 (six) hours as needed for wheezing or shortness of breath.      Marland Kitchen. azelastine (OPTIVAR) 0.05 % ophthalmic solution 1 drop 2 (two) times daily.      Marland Kitchen. BAYER CONTOUR NEXT TEST test strip       . beclomethasone (QVAR) 40 MCG/ACT inhaler Inhale 1 puff into the lungs daily.      . Canagliflozin 100 MG TABS Take 1 tab by mouth in am  90 tablet  1  . Elastic Bandages & Supports (ELBOW BRACE) MISC Use elbow brace during the day. Remove at bedtime.  1 each  0  . levocetirizine (XYZAL) 5 MG tablet Take 5 mg by mouth every evening.      Marland Kitchen. LORazepam (ATIVAN) 0.5 MG tablet 1/2 tablet q day prn  20 tablet  0  . losartan-hydrochlorothiazide (HYZAAR) 100-12.5 MG per tablet Take 1 tablet by mouth daily.  90 tablet  1  . metformin (FORTAMET) 1000 MG (OSM) 24 hr tablet Take 1 tablet  (1,000 mg total) by mouth 2 (two) times daily with a meal.  180 tablet  3  . Olopatadine HCl (PATANASE) 0.6 % SOLN Place into the nose as needed.      . pantoprazole (PROTONIX) 40 MG tablet Take 1 tablet (40 mg total) by mouth 2 (two) times daily.  180 tablet  1  . sitaGLIPtin (JANUVIA) 100 MG tablet Take 1 tablet (100 mg total) by mouth daily.  90 tablet  2  . triamcinolone cream (KENALOG) 0.1 % Apply 1 application topically 2 (two) times daily.  30 g  0  . zolpidem (AMBIEN) 5 MG tablet Take 5 mg by mouth at bedtime as needed.       No current facility-administered medications on file prior to visit.    Review of Systems  HENT: Positive for ear pain.   no headache.  Ear fullness and pain as outlined.  Denies pain currently.  No increased sinus congestion or nasal congestion.   No chest pain, tightness or palpitations.  No sob.  No vomiting.  No acid reflux. Discussed diet and exercise.  Discussed decreasing bread and low carb foods.  Discussed the need for a regular exercise routine.  Objective:   Physical Exam  Filed Vitals:   09/01/14 1355  BP: 120/70  Pulse: 80  Temp: 98.7 F (3637.361 C)   33 year old female in no acute distress.  HEENT:  Nares clear.  Ears:  No cerumen impaction.  No increased redness.   NECK:  Supple.  Non tender. HEART:  Appears to be regular. LUNGS:  No crackles or wheezing audible.  Respirations even and unlabored.  RADIAL PULSE:  Equal bilaterally.  ABDOMEN:  Soft, nontender.  Bowel sounds present and normal.  No audible abdominal bruit.                    Assessment & Plan:

## 2014-09-04 NOTE — Assessment & Plan Note (Signed)
Discussed diet and exercise with her today.  Discussed low carb diet and monitoring carbs (bread intake, etc).  Discussed exercise and importance of getting into a routine of exercising.  Form completed.  Will follow.

## 2014-09-14 ENCOUNTER — Ambulatory Visit: Payer: 59 | Admitting: Internal Medicine

## 2014-09-18 ENCOUNTER — Ambulatory Visit (INDEPENDENT_AMBULATORY_CARE_PROVIDER_SITE_OTHER): Payer: 59 | Admitting: Internal Medicine

## 2014-09-18 ENCOUNTER — Encounter: Payer: Self-pay | Admitting: Internal Medicine

## 2014-09-18 VITALS — BP 110/70 | HR 98 | Temp 98.6°F | Ht 61.5 in | Wt 266.8 lb

## 2014-09-18 DIAGNOSIS — E119 Type 2 diabetes mellitus without complications: Secondary | ICD-10-CM

## 2014-09-18 DIAGNOSIS — K219 Gastro-esophageal reflux disease without esophagitis: Secondary | ICD-10-CM

## 2014-09-18 DIAGNOSIS — I10 Essential (primary) hypertension: Secondary | ICD-10-CM

## 2014-09-18 DIAGNOSIS — Z658 Other specified problems related to psychosocial circumstances: Secondary | ICD-10-CM

## 2014-09-18 DIAGNOSIS — F439 Reaction to severe stress, unspecified: Secondary | ICD-10-CM

## 2014-09-18 NOTE — Progress Notes (Signed)
Pre visit review using our clinic review tool, if applicable. No additional management support is needed unless otherwise documented below in the visit note. 

## 2014-09-24 ENCOUNTER — Encounter: Payer: Self-pay | Admitting: Internal Medicine

## 2014-09-24 NOTE — Progress Notes (Signed)
Subjective:    Patient ID: Shirley [name], female    DOB: 1981-06-07, 33 y.o.   MRN: [id number]  HPI 33 year old female with past history of hypertension, diabetes and GERD who comes in today for a scheduled follow up. States she is doing well.  No chest pain or tightness.  No sob.  No nausea or vomiting.  Eating.  Sometimes skips breakfast and goes a long time without eating.  Some lows when she does this.  States am sugars averaging 98-110-120.  Did not bring in any sugar readings.  We discussed the importance of eating regular meals and not going long periods without eating.  Sees endocrinology.  Overall feels things are stable.      Past Medical History  Diagnosis Date  . Depression   . Diabetes mellitus   . Migraine   . Hypertension   . IBS (irritable bowel syndrome)   . GERD (gastroesophageal reflux disease)   . Anemia     Current Outpatient Prescriptions on File Prior to Visit  Medication Sig Dispense Refill  . albuterol (PROVENTIL HFA;VENTOLIN HFA) 108 (90 BASE) MCG/ACT inhaler Inhale into the lungs every 6 (six) hours as needed for wheezing or shortness of breath.    Marland Kitchen. azelastine (OPTIVAR) 0.05 % ophthalmic solution 1 drop 2 (two) times daily.    Marland Kitchen. BAYER CONTOUR NEXT TEST test strip     . beclomethasone (QVAR) 40 MCG/ACT inhaler Inhale 1 puff into the lungs daily.    . Canagliflozin 100 MG TABS Take 1 tab by mouth in am 90 tablet 1  . Elastic Bandages & Supports (ELBOW BRACE) MISC Use elbow brace during the day. Remove at bedtime. 1 each 0  . levocetirizine (XYZAL) 5 MG tablet Take 5 mg by mouth every evening.    Marland Kitchen. LORazepam (ATIVAN) 0.5 MG tablet 1/2 tablet q day prn 20 tablet 0  . losartan-hydrochlorothiazide (HYZAAR) 100-12.5 MG per tablet Take 1 tablet by mouth daily. 90 tablet 1  . metformin (FORTAMET) 1000 MG (OSM) 24 hr tablet Take 1 tablet (1,000 mg total) by mouth 2 (two) times daily with a meal. 180 tablet 3  . Olopatadine HCl (PATANASE) 0.6 % SOLN Place into the  nose as needed.    . pantoprazole (PROTONIX) 40 MG tablet Take 1 tablet (40 mg total) by mouth 2 (two) times daily. 180 tablet 1  . sitaGLIPtin (JANUVIA) 100 MG tablet Take 1 tablet (100 mg total) by mouth daily. 90 tablet 2  . triamcinolone cream (KENALOG) 0.1 % Apply 1 application topically 2 (two) times daily. 30 g 0  . zolpidem (AMBIEN) 5 MG tablet Take 5 mg by mouth at bedtime as needed.     No current facility-administered medications on file prior to visit.    Review of Systems No headache or light headedness.  No sinus or allergy symptoms.  No chest pain, tightness or palpitations.  No sob.  No vomiting.  No acid reflux.  Sugars as outlined.  Eating and drinking, just sometimes skips breakfast.       Objective:   Physical Exam  Filed Vitals:   09/18/14 1537  BP: 110/70  Pulse: 98  Temp: 98.6 F (4937 C)   33 year old female in no acute distress.  HEENT:  Nares clear.  Oropharynx:  No lesions.  NECK:  Supple.  Non tender. HEART:  Appears to be regular. LUNGS:  No crackles or wheezing audible.  Respirations even and unlabored.  RADIAL PULSE:  Equal  bilaterally.  ABDOMEN:  Soft, nontender.  Bowel sounds present and normal.  No audible abdominal bruit.    EXTREMITIES:  No increased edema present.  DP pulses palpable and equal bilaterally.  No pain with palpation over the lower extremities.      FEET:  No lesions.                  Assessment & Plan:  1. Essential hypertension Blood pressure doing well.  Follow metabolic panel.   2. Type 2 diabetes mellitus without complication Discussed importance of eating regular meals and not going long periods without eating.  Sugars as outlined.  Continue to f/u with endocrinology.    3. Gastroesophageal reflux disease, esophagitis presence not specified Symptoms controlled.    4. Stress Overall feels she is doing relatively well.  Follow.    5. Severe obesity (BMI >= 40) Diet, exercise and weight loss.  Follow.    HEALTH  MAINTENANCE.  Physical 02/13/14.  Will do a pap smear next visit.  She wanted to hold today. Discussed need for baseline mammogram.  Cholesterol 02/08/14 wnl.

## 2014-10-31 ENCOUNTER — Ambulatory Visit (INDEPENDENT_AMBULATORY_CARE_PROVIDER_SITE_OTHER): Payer: 59 | Admitting: Internal Medicine

## 2014-10-31 ENCOUNTER — Encounter: Payer: Self-pay | Admitting: Internal Medicine

## 2014-10-31 VITALS — BP 106/82 | HR 105 | Temp 98.4°F | Resp 14 | Wt 264.0 lb

## 2014-10-31 DIAGNOSIS — R809 Proteinuria, unspecified: Secondary | ICD-10-CM

## 2014-10-31 DIAGNOSIS — E119 Type 2 diabetes mellitus without complications: Secondary | ICD-10-CM

## 2014-10-31 DIAGNOSIS — IMO0001 Reserved for inherently not codable concepts without codable children: Secondary | ICD-10-CM

## 2014-10-31 NOTE — Patient Instructions (Signed)
Please continue: - Januvia 100 mg in am - Metformin XR (osm) 1000 mg tabs 2x a day with meals  - Invokana 100 mg in am  Please return in 3 months with your sugar log.   [facility] labs.

## 2014-10-31 NOTE — Progress Notes (Signed)
Patient ID: Shirley [name], female   DOB: 1980/12/16, 33 y.o.   MRN: [id number]  HPI: Shirley DomRaytarsha M Santiesteban is a 33 y.o.-year-old female, returning for f/u for DM2, dx 2005, non-insulin-dependent, uncontrolled, with complications (MAU). Last visit 3 mo ago.  Last hemoglobin A1c was: 07/13/2014: 7.2%  - at work Lab Results  Component Value Date   HGBA1C 7.5* 07/01/2014   HGBA1C 7.0* 04/22/2013  01/2014: 7.8% 08/2013: 7.2%  Pt is on a regimen of: - Januvia 100 mg in am - Metformin XR (osm) 1000 mg tabs 2x a day with meals >> some loose stools - Invokana 100 - added 03/2014 She was on Janumet XR 1000-100 mg po with dinner (used to cut them in half to swallow them better!!!) She was on Metformin in the past >> diarrhea at max dose  Pt checks her sugars 0-1x a day and they are: - am: 130-147 >> 122-165 (1x 188) >> 120-142 >> n/c - 2h after b'fast: 120-130 >> 113-171 (1x 207) >> 98-164, 187 x1>> 115-148, 172 - before lunch: n/c >> 111-129 - 2h after lunch: 140-165 >> 127-199 (1x 213) >> 101-177 >> 144, 153 - before dinner: 170-200s (after popcorn) >> 93-126 >> 120-141, 195 x1 >> 141, 200 - 2h after dinner: n/c - bedtime: n/c - nighttime: n/c No lows. Lowest sugar was 111;? if  she has hypoglycemia awareness. Highest sugar was 195x1 >> 200.  Pt's meals are: - Breakfast: sliced apples, 2 Nutrigrain bars - Lunch: leftovers from dinner; sandwich - Dinner: hamburgers; chicken + veggies + starch + cookie or cake on weekends - Snacks: yoghurt ([place]). No more popcorn >> sugars better at dinnertime.  - no CKD, last BUN/creatinine normal in 08/2013, also:  Lab Results  Component Value Date   BUN 11 07/01/2014   CREATININE 0.8 07/01/2014  She is on Losartan. Last ACR: 30.1 in 07/21/2013 - No HL: Lab Results  Component Value Date   CHOL 158 07/01/2014   HDL 54 07/01/2014   LDLCALC 85 07/01/2014   TRIG 97 07/01/2014  - last [facility] was in 12/12/2013[facility]. No DR.  - no  numbness and tingling in her feet.  She also has a history of vit D def., HTN, GERD. Had a normal gastric emptying study.   I reviewed pt's medications, allergies, PMH, social hx, family hx and no changes required, except as mentioned above.  ROS: Constitutional: no weight gain/loss, no fatigue, no subjective hyperthermia/hypothermia Eyes: no blurry vision, no xerophthalmia ENT: no sore throat, no nodules palpated in throat, no dysphagia/odynophagia, no hoarseness Cardiovascular: no CP/+ SOB/no palpitations/leg swelling Respiratory: no cough/+ SOB Gastrointestinal: no N/V/D/C Musculoskeletal: no muscle/joint aches Skin: no rashes Neurological: no tremors/numbness/tingling/dizziness  PE: BP 106/82 mmHg  Pulse 105  Temp(Src) 98.4 F (36.9 C) (Oral)  Resp 14  Wt 264 lb (119.75 kg)  SpO2 97% Wt Readings from Last 3 Encounters:  10/31/14 264 lb (119.75 kg)  09/18/14 266 lb 12 oz (120.997 kg)  09/01/14 265 lb 4 oz (120.317 kg)   Constitutional: obese class 3 - see below, in NAD Eyes: PERRLA, EOMI, no exophthalmos ENT: moist mucous membranes, no thyromegaly, no cervical lymphadenopathy Cardiovascular: RRR, No MRG Respiratory: CTA B Gastrointestinal: abdomen soft, NT, ND, BS+ Musculoskeletal: no deformities, strength intact in all 4 Skin: moist, warm, no rashes, acanthosis nigricans on neck; hirsutism face, chest Neurological: no tremor with outstretched hands, DTR normal in all 4   ASSESSMENT: 1. DM2, non-insulin-dependent, uncontrolled, without complications -  MAU  PLAN:  1. Patient with long-standing, fairly well controlled diabetes, on oral antidiabetic regimen, with still good control - We discussed about options for treatment, and I suggested to continue current regimen:  Patient Instructions  Please continue: - Januvia 100 mg in am - Metformin XR (osm) 1000 mg tabs 2x a day with meals  - Invokana 100 mg in am Please return in 3 months with your sugar log.  -  continue checking sugars at different times of the day - check 2 times a day, rotating checks >> advised her to start checking 2h after dinner and at bedtime  - up to date with yearly eye exams - check HbA1c today - Return to clinic in 3 mo with sugar log   Pt did not stop at the lab.Marland Kitchen..Marland Kitchen

## 2014-11-20 ENCOUNTER — Other Ambulatory Visit: Payer: Self-pay | Admitting: Internal Medicine

## 2014-12-06 ENCOUNTER — Ambulatory Visit (INDEPENDENT_AMBULATORY_CARE_PROVIDER_SITE_OTHER): Payer: 59 | Admitting: Internal Medicine

## 2014-12-06 ENCOUNTER — Other Ambulatory Visit: Payer: Self-pay | Admitting: Internal Medicine

## 2014-12-06 ENCOUNTER — Encounter: Payer: Self-pay | Admitting: Internal Medicine

## 2014-12-06 VITALS — BP 124/84 | HR 78 | Temp 98.8°F | Ht 61.1 in | Wt 261.5 lb

## 2014-12-06 DIAGNOSIS — Z124 Encounter for screening for malignant neoplasm of cervix: Secondary | ICD-10-CM

## 2014-12-06 DIAGNOSIS — Z1239 Encounter for other screening for malignant neoplasm of breast: Secondary | ICD-10-CM

## 2014-12-06 DIAGNOSIS — Z658 Other specified problems related to psychosocial circumstances: Secondary | ICD-10-CM

## 2014-12-06 DIAGNOSIS — F439 Reaction to severe stress, unspecified: Secondary | ICD-10-CM

## 2014-12-06 DIAGNOSIS — E119 Type 2 diabetes mellitus without complications: Secondary | ICD-10-CM

## 2014-12-06 DIAGNOSIS — I10 Essential (primary) hypertension: Secondary | ICD-10-CM

## 2014-12-06 DIAGNOSIS — K219 Gastro-esophageal reflux disease without esophagitis: Secondary | ICD-10-CM

## 2014-12-06 NOTE — Progress Notes (Signed)
Pre visit review using our clinic review tool, if applicable. No additional management support is needed unless otherwise documented below in the visit note. 

## 2014-12-07 LAB — BASIC METABOLIC PANEL
BUN: 12 mg/dL (ref 4–21)
Creatinine: 0.8 mg/dL (ref 0.5–1.1)
Glucose: 116 mg/dL
Potassium: 4.1 mmol/L (ref 3.4–5.3)
Sodium: 138 mmol/L (ref 137–147)

## 2014-12-07 LAB — HEMOGLOBIN A1C: Hgb A1c MFr Bld: 7 % — AB (ref 4.0–6.0)

## 2014-12-08 ENCOUNTER — Encounter: Payer: Self-pay | Admitting: *Deleted

## 2014-12-10 ENCOUNTER — Encounter: Payer: Self-pay | Admitting: Internal Medicine

## 2014-12-10 NOTE — Progress Notes (Signed)
Subjective:    Patient ID: Shirley [name], female    DOB: 1981/02/21, 34 y.o.   MRN: [id number]  HPI 34 year old female with past history of hypertension, diabetes and GERD who comes in today for her physical and her pap smear.  States she is doing well.  No chest pain or tightness.  No sob.  No nausea or vomiting.  States am sugars averaging 120-134 and pm sugars averaging 148-158.  Did not bring in any sugar readings.  We discussed the importance of eating regular meals and not going long periods without eating.  Sees endocrinology.  Overall feels things are stable.  Bowels stable.  Handling stress.       Past Medical History  Diagnosis Date  . Depression   . Diabetes mellitus   . Migraine   . Hypertension   . IBS (irritable bowel syndrome)   . GERD (gastroesophageal reflux disease)   . Anemia     Current Outpatient Prescriptions on File Prior to Visit  Medication Sig Dispense Refill  . albuterol (PROVENTIL HFA;VENTOLIN HFA) 108 (90 BASE) MCG/ACT inhaler Inhale into the lungs every 6 (six) hours as needed for wheezing or shortness of breath.    Marland Kitchen BAYER CONTOUR NEXT TEST test strip     . beclomethasone (QVAR) 40 MCG/ACT inhaler Inhale 1 puff into the lungs daily.    . Canagliflozin 100 MG TABS Take 1 tab by mouth in am 90 tablet 1  . levocetirizine (XYZAL) 5 MG tablet Take 5 mg by mouth every evening.    Marland Kitchen losartan-hydrochlorothiazide (HYZAAR) 100-12.5 MG per tablet Take 1 tablet by mouth  daily 90 tablet 1  . metformin (FORTAMET) 1000 MG (OSM) 24 hr tablet Take 1 tablet (1,000 mg total) by mouth 2 (two) times daily with a meal. 180 tablet 3  . Olopatadine HCl (PATANASE) 0.6 % SOLN Place into the nose as needed.    . pantoprazole (PROTONIX) 40 MG tablet Take 1 tablet (40 mg total) by mouth 2 (two) times daily. 180 tablet 1  . sitaGLIPtin (JANUVIA) 100 MG tablet Take 1 tablet (100 mg total) by mouth daily. 90 tablet 2  . azelastine (OPTIVAR) 0.05 % ophthalmic solution 1 drop 2 (two)  times daily.    Marland Kitchen LORazepam (ATIVAN) 0.5 MG tablet 1/2 tablet q day prn (Patient not taking: Reported on 12/06/2014) 20 tablet 0  . zolpidem (AMBIEN) 5 MG tablet Take 5 mg by mouth at bedtime as needed.     No current facility-administered medications on file prior to visit.    Review of Systems No headache or light headedness.  No dizziness.  No sinus or allergy symptoms.  No chest pain, tightness or palpitations.  No sob. No cough or congestion.  No vomiting.  No acid reflux.  Sugars as outlined.  Discussed diet adjustment and exercise.         Objective:   Physical Exam  Filed Vitals:   12/06/14 0934  BP: 124/84  Pulse: 78  Temp: 98.8 F (84.82 C)   34 year old female in no acute distress.   HEENT:  Nares- clear.  Oropharynx - without lesions. NECK:  Supple.  Nontender.  No audible bruit.  HEART:  Appears to be regular. LUNGS:  No crackles or wheezing audible.  Respirations even and unlabored.  RADIAL PULSE:  Equal bilaterally.    BREASTS:  No nipple discharge or nipple retraction present.  Could not appreciate any distinct nodules or axillary adenopathy.  ABDOMEN:  Soft, nontender.  Bowel sounds present and normal.  No audible abdominal bruit.  GU:  Normal external genitalia.  Vaginal vault without lesions.  Cervix identified.  Pap performed. Could not appreciate any adnexal masses or tenderness.  EXTREMITIES:  No increased edema present.  DP pulses palpable and equal bilaterally.      FEET:  No lesions.                  Assessment & Plan:  1. Pap smear for cervical cancer screening - Cytology - PAP - Wet prep, genital  2. Essential hypertension Blood pressure doing well.  Same medication regimen.  Follow.    3. Type 2 diabetes mellitus without complication Sugars as outlined.  Seeing endocrinology.  Never had her a1c  checked with this last request.  Will recheck with our labs and forward.    4. Severe obesity (BMI >= 40) Diet and exercise.    5. Gastroesophageal  reflux disease, esophagitis presence not specified Controlled.  On protonix.    6. Stress Doing better.  Follow.    7.  VAGINITIS.  Wet prep obtained.    HEALTH MAINTENANCE.  Physical 02/13/14.   Discussed need for baseline mammogram.  Need to schedule.  Cholesterol 02/08/14 wnl.   I spent 25 minutes with the patient and more than 50% of the time was spent in consultation regarding the above.

## 2014-12-12 ENCOUNTER — Encounter: Payer: Self-pay | Admitting: Internal Medicine

## 2014-12-12 LAB — PAP LB, HPV-H+LR
HPV DNA High Risk: NEGATIVE
HPV DNA Low Risk: NEGATIVE
PAP Smear Comment: 0

## 2014-12-18 ENCOUNTER — Encounter: Payer: Self-pay | Admitting: Internal Medicine

## 2014-12-24 ENCOUNTER — Telehealth: Payer: Self-pay | Admitting: Internal Medicine

## 2014-12-24 NOTE — Telephone Encounter (Signed)
Pt notified of lab results via my chart.  a1c 7.0.

## 2015-01-08 ENCOUNTER — Other Ambulatory Visit: Payer: Self-pay | Admitting: Internal Medicine

## 2015-01-30 ENCOUNTER — Ambulatory Visit (INDEPENDENT_AMBULATORY_CARE_PROVIDER_SITE_OTHER): Payer: 59 | Admitting: Internal Medicine

## 2015-01-30 ENCOUNTER — Encounter: Payer: Self-pay | Admitting: Internal Medicine

## 2015-01-30 ENCOUNTER — Other Ambulatory Visit: Payer: Self-pay | Admitting: Internal Medicine

## 2015-01-30 VITALS — BP 122/72 | HR 91 | Temp 98.1°F | Resp 12 | Wt 261.0 lb

## 2015-01-30 DIAGNOSIS — R809 Proteinuria, unspecified: Secondary | ICD-10-CM

## 2015-01-30 DIAGNOSIS — E119 Type 2 diabetes mellitus without complications: Secondary | ICD-10-CM

## 2015-01-30 DIAGNOSIS — IMO0001 Reserved for inherently not codable concepts without codable children: Secondary | ICD-10-CM

## 2015-01-30 MED ORDER — SITAGLIPTIN PHOSPHATE 100 MG PO TABS: ORAL_TABLET | ORAL | Status: DC

## 2015-01-30 NOTE — Patient Instructions (Signed)
Please continue: - Januvia 100 mg in am - Metformin XR (osm) 1000 mg tabs 2x a day with meals  - Invokana 100 mg in am  Please return in 3 months with your sugar log. Check some sugars at bedtime, too.

## 2015-01-30 NOTE — Progress Notes (Signed)
Patient ID: Shirley [name], female   DOB: [birth year], 34 y.o.   MRN: [id number]  HPI: Shirley DomRaytarsha M Reffitt is a 34 y.o.-year-old female, returning for f/u for DM2, dx 2005, non-insulin-dependent, uncontrolled, with complications (MAU). Last visit 3 mo ago.  She will likely have GBP sx (sleeve gastrectomy) this summer.  Last hemoglobin A1c was: Lab Results  Component Value Date   HGBA1C 7.0* 12/07/2014   HGBA1C 7.5* 07/01/2014   HGBA1C 7.0* 04/22/2013  07/13/2014: 7.2%  - at work 01/2014: 7.8% 08/2013: 7.2%  Pt is on a regimen of: - Januvia 100 mg in am - Metformin XR (osm) 1000 mg tabs 2x a day with meals >> some loose stools - Invokana 100 - added 03/2014 She was on Janumet XR 1000-100 mg po with dinner (used to cut them in half to swallow them better!!!) She was on Metformin in the past >> diarrhea at max dose  Pt checks her sugars 0-3x a day and they are: - am: 130-147 >> 122-165 (1x 188) >> 120-142 >> n/c >> 124-145 - 2h after b'fast: 120-130 >> 113-171 (1x 207) >> 98-164, 187 x1>> 115-148, 172 >> 133-144, 194 - before lunch: n/c >> 111-129 >> 87, 104-135, 184 - 2h after lunch: 140-165 >> 127-199 (1x 213) >> 101-177 >> 144, 153 >> n/c - before dinner: 93-126 >> 120-141, 195 x1 >> 141, 200 >> 122-160, 181 (snack before leaves work) - 2h after dinner: n/c - bedtime: n/c - nighttime: n/c No lows. Lowest sugar was 111;? if  she has hypoglycemia awareness. Highest sugar was 195x1 >> 200 >> 184  Pt's meals are: - Breakfast: sliced apples, 2 Nutrigrain bars - Lunch: leftovers from dinner; sandwich - Dinner: hamburgers; chicken + veggies + starch + cookie or cake on weekends - Snacks: yoghurt ([place]). No more popcorn >> sugars better at dinnertime.  - no CKD, last BUN/creatinine normal in 08/2013, also:  Lab Results  Component Value Date   BUN 12 12/07/2014   CREATININE 0.8 12/07/2014  She is on Losartan. Last ACR: 30.1 in 07/21/2013 - No HL: Lab Results  Component Value  Date   CHOL 158 07/01/2014   HDL 54 07/01/2014   LDLCALC 85 07/01/2014   TRIG 97 07/01/2014  - last [facility] was in 12/12/2013[facility]. No DR.  - no numbness and tingling in her feet.  She also has a history of vit D def., HTN, GERD. Had a normal gastric emptying study.   I reviewed pt's medications, allergies, PMH, social hx, family hx, and changes were documented in the history of present illness. Otherwise, unchanged from my initial visit note.  ROS: Constitutional: no weight gain/loss, no fatigue, no subjective hyperthermia/hypothermia Eyes: no blurry vision, no xerophthalmia ENT: no sore throat, no nodules palpated in throat, no dysphagia/odynophagia, no hoarseness Cardiovascular: no CP/SOB/no palpitations/leg swelling Respiratory: no cough/SOB Gastrointestinal: no N/V/D/C Musculoskeletal: no muscle/joint aches Skin: no rashes Neurological: no tremors/numbness/tingling/dizziness  PE: BP 122/72 mmHg  Pulse 91  Temp(Src) 98.1 F (36.7 C) (Oral)  Resp 12  Wt 261 lb (118.389 kg)  SpO2 98% Body mass index is 49.15 kg/(m^2). Wt Readings from Last 3 Encounters:  01/30/15 261 lb (118.389 kg)  12/06/14 261 lb 8 oz (118.616 kg)  10/31/14 264 lb (119.75 kg)   Constitutional: obese class 3 - see below, in NAD Eyes: PERRLA, EOMI, no exophthalmos ENT: moist mucous membranes, no thyromegaly, no cervical lymphadenopathy Cardiovascular: RRR, No MRG Respiratory: CTA B Gastrointestinal: abdomen soft, NT,  ND, BS+ Musculoskeletal: no deformities, strength intact in all 4 Skin: moist, warm, no rashes, acanthosis nigricans on neck; hirsutism face, chest Neurological: no tremor with outstretched hands, DTR normal in all 4   ASSESSMENT: 1. DM2, non-insulin-dependent, uncontrolled, without complications - MAU  PLAN:  1. Patient with long-standing, fairly well controlled diabetes, on oral antidiabetic regimen, with still good control. Last Hba1c reviewed back down to 7%. - We  discussed about options for treatment, and I suggested to continue current regimen:  Patient Instructions  Please continue: - Januvia 100 mg in am - Metformin XR (osm) 1000 mg tabs 2x a day with meals  - Invokana 100 mg in am  Please return in 3 months with your sugar log.  - continue checking sugars at different times of the day - check 2 times a day, rotating checks >> advised her to start checking 2h after dinner and at bedtime  - needs a new eye exam  - Return to clinic in 3 mo with sugar log

## 2015-02-12 ENCOUNTER — Ambulatory Visit: Payer: Self-pay | Admitting: Bariatrics

## 2015-03-07 ENCOUNTER — Encounter: Payer: Self-pay | Admitting: Internal Medicine

## 2015-03-08 ENCOUNTER — Encounter: Payer: Self-pay | Admitting: Internal Medicine

## 2015-03-08 ENCOUNTER — Ambulatory Visit (INDEPENDENT_AMBULATORY_CARE_PROVIDER_SITE_OTHER): Payer: 59 | Admitting: Internal Medicine

## 2015-03-08 VITALS — BP 110/80 | HR 92 | Temp 98.5°F | Ht 61.1 in | Wt 263.4 lb

## 2015-03-08 DIAGNOSIS — R21 Rash and other nonspecific skin eruption: Secondary | ICD-10-CM | POA: Diagnosis not present

## 2015-03-08 MED ORDER — LOSARTAN POTASSIUM-HCTZ 100-12.5 MG PO TABS: ORAL_TABLET | ORAL | Status: DC

## 2015-03-08 MED ORDER — CEPHALEXIN 500 MG PO CAPS: 500.0000 mg | ORAL_CAPSULE | Freq: Three times a day (TID) | ORAL | Status: DC

## 2015-03-08 MED ORDER — PANTOPRAZOLE SODIUM 40 MG PO TBEC: 40.0000 mg | DELAYED_RELEASE_TABLET | Freq: Two times a day (BID) | ORAL | Status: DC

## 2015-03-08 NOTE — Progress Notes (Signed)
Pre visit review using our clinic review tool, if applicable. No additional management support is needed unless otherwise documented below in the visit note. 

## 2015-03-08 NOTE — Patient Instructions (Signed)
Take align one per day while on the antibiotics and for two weeks after complete antibitotic.

## 2015-03-12 ENCOUNTER — Encounter: Payer: Self-pay | Admitting: Internal Medicine

## 2015-03-12 NOTE — Assessment & Plan Note (Signed)
Rash as outlined.  Question if folliculitis.  She does shave.  Keflex as directed.  Follow.  Align with the abx.

## 2015-03-12 NOTE — Progress Notes (Signed)
Patient ID: Shirley [name], female   DOB: 1981/09/22, 34 y.o.   MRN: [id number]   Subjective:    Patient ID: Shirley [name], female    DOB: Nov 10, 1981, 34 y.o.   MRN: [id number]  HPI  Patient here as a work in with concerns regarding a rash on her face.  States started over the last week.  Itching on her forehead.  Rash localized - side of face and around her mouth.  No fever.  No chills.  She does shave.     Past Medical History  Diagnosis Date  . Depression   . Diabetes mellitus   . Migraine   . Hypertension   . IBS (irritable bowel syndrome)   . GERD (gastroesophageal reflux disease)   . Anemia     Current Outpatient Prescriptions on File Prior to Visit  Medication Sig Dispense Refill  . albuterol (PROVENTIL HFA;VENTOLIN HFA) 108 (90 BASE) MCG/ACT inhaler Inhale into the lungs every 6 (six) hours as needed for wheezing or shortness of breath.    Marland Kitchen azelastine (OPTIVAR) 0.05 % ophthalmic solution 1 drop 2 (two) times daily.    Marland Kitchen BAYER CONTOUR NEXT TEST test strip     . beclomethasone (QVAR) 40 MCG/ACT inhaler Inhale 1 puff into the lungs daily.    . INVOKANA 100 MG TABS tablet Take 1 tablet by mouth in  the morning 90 tablet 1  . levocetirizine (XYZAL) 5 MG tablet Take 5 mg by mouth every evening.    Marland Kitchen LORazepam (ATIVAN) 0.5 MG tablet 1/2 tablet q day prn 20 tablet 0  . metformin (FORTAMET) 1000 MG (OSM) 24 hr tablet Take 1 tablet (1,000 mg total) by mouth 2 (two) times daily with a meal. 180 tablet 3  . Olopatadine HCl (PATANASE) 0.6 % SOLN Place into the nose as needed.    . sitaGLIPtin (JANUVIA) 100 MG tablet Take 1 tablet by mouth  daily 90 tablet 1  . zolpidem (AMBIEN) 5 MG tablet Take 5 mg by mouth at bedtime as needed.     No current facility-administered medications on file prior to visit.    Review of Systems  HENT: Negative for congestion and sinus pressure.   Respiratory: Negative for shortness of breath.   Skin: Positive for rash.       Increased burning and  itching - rash - face as outlined.  Denies any new soaps or detergents.  No new lotion.   Neurological: Negative for dizziness and headaches.  Hematological: Negative for adenopathy.       Objective:    Physical Exam  Constitutional: She appears well-developed and well-nourished. No distress.  HENT:  Nose: Nose normal.  Mouth/Throat: Oropharynx is clear and moist.  Neck: Neck supple.  Cardiovascular: Normal rate and regular rhythm.   Pulmonary/Chest: Effort normal and breath sounds normal. No respiratory distress.  Lymphadenopathy:    She has no cervical adenopathy.  Skin:  Small raised bumps over side of face, chin and around her mouth.      BP 110/80 mmHg  Pulse 92  Temp(Src) 98.5 F (36.9 C) (Oral)  Ht 5' 1.1" (1.552 m)  Wt 263 lb 6 oz (119.466 kg)  BMI 49.60 kg/m2  SpO2 98% Wt Readings from Last 3 Encounters:  03/08/15 263 lb 6 oz (119.466 kg)  01/30/15 261 lb (118.389 kg)  12/06/14 261 lb 8 oz (118.616 kg)     Lab Results  Component Value Date   WBC 6.8 07/01/2014   HGB 12.8 07/01/2014  HCT 39 07/01/2014   PLT 264 07/01/2014   GLUCOSE 121* 04/22/2013   CHOL 158 07/01/2014   TRIG 97 07/01/2014   HDL 54 07/01/2014   LDLCALC 85 07/01/2014   ALT 14 07/01/2014   AST 13 07/01/2014   NA 138 12/07/2014   K 4.1 12/07/2014   CL 103 04/22/2013   CREATININE 0.8 12/07/2014   BUN 12 12/07/2014   CO2 20 04/22/2013   TSH 1.56 07/01/2014   HGBA1C 7.0* 12/07/2014       Assessment & Plan:   Problem List Items Addressed This Visit    Rash - Primary    Rash as outlined.  Question if folliculitis.  She does shave.  Keflex as directed.  Follow.  Align with the abx.          I spent 15 minutes with the patient and more than 50% of the time was spent in consultation regarding the above.     Dale DurhamSCOTT, Viney Acocella, MD

## 2015-04-06 ENCOUNTER — Encounter: Payer: Self-pay | Admitting: Internal Medicine

## 2015-04-06 ENCOUNTER — Ambulatory Visit (INDEPENDENT_AMBULATORY_CARE_PROVIDER_SITE_OTHER): Payer: 59 | Admitting: Internal Medicine

## 2015-04-06 VITALS — BP 106/70 | HR 83 | Temp 98.8°F | Ht 61.1 in | Wt 260.0 lb

## 2015-04-06 DIAGNOSIS — K219 Gastro-esophageal reflux disease without esophagitis: Secondary | ICD-10-CM | POA: Diagnosis not present

## 2015-04-06 DIAGNOSIS — IMO0001 Reserved for inherently not codable concepts without codable children: Secondary | ICD-10-CM

## 2015-04-06 DIAGNOSIS — R21 Rash and other nonspecific skin eruption: Secondary | ICD-10-CM | POA: Diagnosis not present

## 2015-04-06 DIAGNOSIS — Z9109 Other allergy status, other than to drugs and biological substances: Secondary | ICD-10-CM

## 2015-04-06 DIAGNOSIS — I10 Essential (primary) hypertension: Secondary | ICD-10-CM | POA: Diagnosis not present

## 2015-04-06 DIAGNOSIS — E559 Vitamin D deficiency, unspecified: Secondary | ICD-10-CM

## 2015-04-06 DIAGNOSIS — Z91048 Other nonmedicinal substance allergy status: Secondary | ICD-10-CM

## 2015-04-06 DIAGNOSIS — E119 Type 2 diabetes mellitus without complications: Secondary | ICD-10-CM

## 2015-04-06 DIAGNOSIS — F439 Reaction to severe stress, unspecified: Secondary | ICD-10-CM

## 2015-04-06 DIAGNOSIS — Z658 Other specified problems related to psychosocial circumstances: Secondary | ICD-10-CM

## 2015-04-06 DIAGNOSIS — R809 Proteinuria, unspecified: Secondary | ICD-10-CM

## 2015-04-06 NOTE — Progress Notes (Signed)
Pre visit review using our clinic review tool, if applicable. No additional management support is needed unless otherwise documented below in the visit note. 

## 2015-04-06 NOTE — Progress Notes (Signed)
Patient ID: Shirley [name], female   DOB: 1981/04/12, 34 y.o.   MRN: [id number]   Subjective:    Patient ID: Shirley [name], female    DOB: 1981-05-20, 34 y.o.   MRN: [id number]  HPI  Patient here for a scheduled follow up.  Has been watching her diet.  Trying to exercise more.  Trying to lose weight.   Being evaluated for bariatric surgery.  Planning for stress test soon.  Needs sleep study results forwarded.  Brought in no sugar readings.  Does not appear to be checking regularly.  Last check 134.  Seeing endocrinology.  No rash now.  Had reoccurrence after the rash on her face.  Saw her allergist.  Given prednisone.  Improved.  She was questioning if could be something out in the yard.     Past Medical History  Diagnosis Date  . Depression   . Diabetes mellitus   . Migraine   . Hypertension   . IBS (irritable bowel syndrome)   . GERD (gastroesophageal reflux disease)   . Anemia     Current Outpatient Prescriptions on File Prior to Visit  Medication Sig Dispense Refill  . albuterol (PROVENTIL HFA;VENTOLIN HFA) 108 (90 BASE) MCG/ACT inhaler Inhale into the lungs every 6 (six) hours as needed for wheezing or shortness of breath.    Marland Kitchen azelastine (OPTIVAR) 0.05 % ophthalmic solution 1 drop 2 (two) times daily.    Marland Kitchen BAYER CONTOUR NEXT TEST test strip     . beclomethasone (QVAR) 40 MCG/ACT inhaler Inhale 1 puff into the lungs daily.    . INVOKANA 100 MG TABS tablet Take 1 tablet by mouth in  the morning 90 tablet 1  . levocetirizine (XYZAL) 5 MG tablet Take 5 mg by mouth every evening.    Marland Kitchen losartan-hydrochlorothiazide (HYZAAR) 100-12.5 MG per tablet Take 1 tablet by mouth  daily 90 tablet 1  . metformin (FORTAMET) 1000 MG (OSM) 24 hr tablet Take 1 tablet (1,000 mg total) by mouth 2 (two) times daily with a meal. 180 tablet 3  . Olopatadine HCl (PATANASE) 0.6 % SOLN Place into the nose as needed.    . pantoprazole (PROTONIX) 40 MG tablet Take 1 tablet (40 mg total) by mouth 2 (two)  times daily. 180 tablet 1  . sitaGLIPtin (JANUVIA) 100 MG tablet Take 1 tablet by mouth  daily 90 tablet 1  . zolpidem (AMBIEN) 5 MG tablet Take 5 mg by mouth at bedtime as needed.     No current facility-administered medications on file prior to visit.    Review of Systems  Constitutional: Negative for appetite change and unexpected weight change.       Trying to adjust diet and exercise.  Planning for weight loss surgery.   HENT: Negative for congestion and sinus pressure.   Respiratory: Negative for cough, chest tightness and shortness of breath.   Cardiovascular: Negative for chest pain, palpitations and leg swelling.  Gastrointestinal: Negative for nausea, vomiting, abdominal pain and diarrhea.  Genitourinary: Negative for dysuria and difficulty urinating.  Skin: Negative for color change and rash.  Neurological: Negative for dizziness, light-headedness and headaches.  Psychiatric/Behavioral: Negative for dysphoric mood and agitation.       Objective:    Physical Exam  Constitutional: She appears well-developed and well-nourished. No distress.  HENT:  Nose: Nose normal.  Mouth/Throat: Oropharynx is clear and moist.  Neck: Neck supple. No thyromegaly present.  Cardiovascular: Normal rate and regular rhythm.   Pulmonary/Chest: Breath sounds normal.  No respiratory distress. She has no wheezes.  Abdominal: Soft. Bowel sounds are normal. There is no tenderness.  Musculoskeletal: She exhibits no edema or tenderness.  Lymphadenopathy:    She has no cervical adenopathy.  Skin: No rash noted. No erythema.  Psychiatric: She has a normal mood and affect. Her behavior is normal.    BP 106/70 mmHg  Pulse 83  Temp(Src) 98.8 F (37.1 C) (Oral)  Ht 5' 1.1" (1.552 m)  Wt 260 lb (117.935 kg)  BMI 48.96 kg/m2  SpO2 99% Wt Readings from Last 3 Encounters:  04/06/15 260 lb (117.935 kg)  03/08/15 263 lb 6 oz (119.466 kg)  01/30/15 261 lb (118.389 kg)     Lab Results  Component  Value Date   WBC 6.8 07/01/2014   HGB 12.8 07/01/2014   HCT 39 07/01/2014   PLT 264 07/01/2014   GLUCOSE 121* 04/22/2013   CHOL 158 07/01/2014   TRIG 97 07/01/2014   HDL 54 07/01/2014   LDLCALC 85 07/01/2014   ALT 14 07/01/2014   AST 13 07/01/2014   NA 138 12/07/2014   K 4.1 12/07/2014   CL 103 04/22/2013   CREATININE 0.8 12/07/2014   BUN 12 12/07/2014   CO2 20 04/22/2013   TSH 1.56 07/01/2014   HGBA1C 7.0* 12/07/2014       Assessment & Plan:   Problem List Items Addressed This Visit    Environmental allergies    Seeing Dr Donneta Romberg.  Stable.        GERD (gastroesophageal reflux disease)    Symptoms controlled on current medication.  Follow.       Hypertension - Primary    Blood pressure doing well.  Same medication regimen.  Follow pressures.  Follow metabolic panel.        Microalbuminuria    On losartan.  Follow renal function.        Rash    Resolved.  Has finished prednisone.  Follow.       Severe obesity (BMI >= 40)    Seeing a bariatric surgeon for gastric surgery.  Planning for stress test soon.  Working on diet and exercise.       Stress    Feels she is doing better.  Follow.        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  Brought in no recorded sugar readings.  Working on diet and exercise.  Follow met b and a1c.       Vitamin D deficiency    Continue supplementation.  Follow.         I spent 25 minutes with the patient and more than 50% of the time was spent in consultation regarding the above.     Einar Pheasant, MD

## 2015-04-07 LAB — HEPATIC FUNCTION PANEL
ALT: 15 U/L (ref 7–35)
AST: 11 U/L — AB (ref 13–35)
Alkaline Phosphatase: 112 U/L (ref 25–125)
Bilirubin, Direct: 0.16 mg/dL (ref 0.01–0.4)
Bilirubin, Total: 0.6 mg/dL

## 2015-04-07 LAB — BASIC METABOLIC PANEL
BUN: 17 mg/dL (ref 4–21)
Creatinine: 0.8 mg/dL (ref 0.5–1.1)
Glucose: 115 mg/dL
Potassium: 4.2 mmol/L (ref 3.4–5.3)
Sodium: 136 mmol/L — AB (ref 137–147)

## 2015-04-07 LAB — CBC AND DIFFERENTIAL
HCT: 40 % (ref 36–46)
Hemoglobin: 12.9 g/dL (ref 12.0–16.0)
Neutrophils Absolute: 5 /uL
Platelets: 236 10*3/uL (ref 150–399)
WBC: 9.4 10^3/mL

## 2015-04-07 LAB — LIPID PANEL
Cholesterol: 176 mg/dL (ref 0–200)
HDL: 78 mg/dL — AB (ref 35–70)
LDL Cholesterol: 82 mg/dL
Triglycerides: 80 mg/dL (ref 40–160)

## 2015-04-07 LAB — HEMOGLOBIN A1C: Hgb A1c MFr Bld: 7.2 % — AB (ref 4.0–6.0)

## 2015-04-09 ENCOUNTER — Telehealth: Payer: Self-pay | Admitting: Internal Medicine

## 2015-04-09 ENCOUNTER — Encounter: Payer: Self-pay | Admitting: Internal Medicine

## 2015-04-09 NOTE — Assessment & Plan Note (Signed)
Continue supplementation.  Follow.

## 2015-04-09 NOTE — Telephone Encounter (Signed)
Records received & placed in green folder for review

## 2015-04-09 NOTE — Telephone Encounter (Signed)
Noted and signed.  Please fax to [facility]ric specialists - see phone number below.  In your box.

## 2015-04-09 NOTE — Assessment & Plan Note (Signed)
Seeing Dr Sharma.  Stable.  

## 2015-04-09 NOTE — Assessment & Plan Note (Signed)
On losartan.  Follow renal function.

## 2015-04-09 NOTE — Telephone Encounter (Signed)
Need copy of sleep study referred to [facility] [place] (phone [phone].  [facility] performed when [facility] was at [facility].  States had at hospital ( I assume [facility]).  Thanks.

## 2015-04-09 NOTE — Assessment & Plan Note (Signed)
Resolved.  Has finished prednisone.  Follow.

## 2015-04-09 NOTE — Assessment & Plan Note (Signed)
Blood pressure doing well.  Same medication regimen.  Follow pressures.  Follow metabolic panel.   

## 2015-04-09 NOTE — Assessment & Plan Note (Signed)
Symptoms controlled on current medication.  Follow.   

## 2015-04-09 NOTE — Assessment & Plan Note (Signed)
Feels she is doing better.  Follow.   

## 2015-04-09 NOTE — Telephone Encounter (Signed)
Sent request via efax

## 2015-04-09 NOTE — Assessment & Plan Note (Signed)
Seeing endocrinology.  Brought in no recorded sugar readings.  Working on diet and exercise.  Follow met b and a1c.

## 2015-04-09 NOTE — Assessment & Plan Note (Signed)
Seeing a bariatric surgeon for gastric surgery.  Planning for stress test soon.  Working on diet and exercise.

## 2015-04-10 ENCOUNTER — Encounter: Payer: Self-pay | Admitting: Internal Medicine

## 2015-04-10 NOTE — Telephone Encounter (Signed)
Sleep study faxed 

## 2015-04-23 ENCOUNTER — Telehealth: Payer: Self-pay | Admitting: Internal Medicine

## 2015-04-23 NOTE — Telephone Encounter (Signed)
Pt notified of lab results via my chart.  Keep f/u appt with endocrinology.

## 2015-05-03 ENCOUNTER — Encounter: Payer: Self-pay | Admitting: Internal Medicine

## 2015-05-03 ENCOUNTER — Ambulatory Visit (INDEPENDENT_AMBULATORY_CARE_PROVIDER_SITE_OTHER): Payer: 59 | Admitting: Internal Medicine

## 2015-05-03 VITALS — BP 114/76 | HR 91 | Temp 98.3°F | Resp 12 | Wt 259.0 lb

## 2015-05-03 DIAGNOSIS — E119 Type 2 diabetes mellitus without complications: Secondary | ICD-10-CM | POA: Diagnosis not present

## 2015-05-03 DIAGNOSIS — IMO0001 Reserved for inherently not codable concepts without codable children: Secondary | ICD-10-CM

## 2015-05-03 DIAGNOSIS — R809 Proteinuria, unspecified: Secondary | ICD-10-CM | POA: Diagnosis not present

## 2015-05-03 NOTE — Progress Notes (Signed)
Patient ID: Shirley [name], female   DOB: 07-15-1981, 34 y.o.   MRN: [id number]  HPI: Shirley [name] is a 34 y.o.-year-old female, returning for f/u for DM2, dx 2005, non-insulin-dependent, uncontrolled, with complications (MAU). Last visit 3 mo ago.  She will likely have GBP sx (sleeve gastrectomy) 07/2015.  She had 2 steroid tapers for a rash >> sugars 180s-200s  Last hemoglobin A1c was: Lab Results  Component Value Date   HGBA1C 7.2* 04/07/2015   HGBA1C 7.0* 12/07/2014   HGBA1C 7.5* 07/01/2014  07/13/2014: 7.2%  - at work 01/2014: 7.8% 08/2013: 7.2%  Pt is on a regimen of: - Januvia 100 mg in am - Metformin XR (osm) 1000 mg tabs 2x a day with meals >> some loose stools - Invokana 100 - added 03/2014 She was on Janumet XR 1000-100 mg po with dinner (used to cut them in half to swallow them better!!!) She was on Metformin in the past >> diarrhea at max dose  Pt checks her sugars 0-3x a day and they are: - am: 130-147 >> 122-165 (1x 188) >> 120-142 >> n/c >> 124-145 >> 135-140 - 2h after b'fast: 113-171 (1x 207) >> 98-164, 187 x1>> 115-148, 172 >> 133-144, 194 >> 150s - before lunch: n/c >> 111-129 >> 87, 104-135, 184 >> 98-120 - 2h after lunch: 140-165 >> 127-199 (1x 213) >> 101-177 >> 144, 153 >> n/c >> 145-160 - before dinner: 120-141, 195 x1 >> 141, 200 >> 122-160, 181 (snack before leaves work) >> 98-120 - 2h after dinner: n/c - bedtime: n/c - nighttime: n/c No lows. Lowest sugar was 111;? if  she has hypoglycemia awareness. Highest sugar was 195x1 >> 200 >> 184 >> 200s (steroids)  Pt's meals are: - Breakfast: sliced apples, 2 Nutrigrain bars - Lunch: leftovers from dinner; sandwich - Dinner: hamburgers; chicken + veggies + starch + cookie or cake on weekends - Snacks: yoghurt ([place]). No more popcorn >> sugars better at dinnertime.  - no CKD, last BUN/creatinine normal:  Lab Results  Component Value Date   BUN 17 04/07/2015   CREATININE 0.8 04/07/2015  She is  on Losartan. Last ACR: 30.1 in 07/21/2013 - No HL: Lab Results  Component Value Date   CHOL 176 04/07/2015   HDL 78* 04/07/2015   LDLCALC 82 04/07/2015   TRIG 80 04/07/2015  - last eye exam was in 12/12/2013: [facility]. No DR.  - no numbness and tingling in her feet.  She also has a history of vit D def., HTN, GERD. Had a normal gastric emptying study.   I reviewed pt's medications, allergies, PMH, social hx, family hx, and changes were documented in the history of present illness. Otherwise, unchanged from my initial visit note.  ROS: Constitutional: no weight gain/loss, no fatigue, no subjective hyperthermia/hypothermia Eyes: no blurry vision, no xerophthalmia ENT: no sore throat, no nodules palpated in throat, no dysphagia/odynophagia, no hoarseness Cardiovascular: no CP/SOB/no palpitations/leg swelling Respiratory: no cough/SOB Gastrointestinal: no N/V/D/C Musculoskeletal: + muscle/no joint aches Skin: no rashes Neurological: no tremors/numbness/tingling/dizziness  PE: BP 114/76 mmHg  Pulse 91  Temp(Src) 98.3 F (36.8 C) (Oral)  Resp 12  Wt 259 lb (117.482 kg)  SpO2 96% Body mass index is 48.77 kg/(m^2). Wt Readings from Last 3 Encounters:  05/03/15 259 lb (117.482 kg)  04/06/15 260 lb (117.935 kg)  03/08/15 263 lb 6 oz (119.466 kg)   Constitutional: obese class 3 - see below, in NAD Eyes: PERRLA, EOMI, no exophthalmos ENT: moist  mucous membranes, no thyromegaly, no cervical lymphadenopathy Cardiovascular: RRR, No MRG Respiratory: CTA B Gastrointestinal: abdomen soft, NT, ND, BS+ Musculoskeletal: no deformities, strength intact in all 4 Skin: moist, warm, no rashes, acanthosis nigricans on neck; hirsutism face, chest Neurological: no tremor with outstretched hands, DTR normal in all 4   ASSESSMENT: 1. DM2, non-insulin-dependent, uncontrolled, without complications - MAU  PLAN:  1. Patient with long-standing, fairly well controlled diabetes, on oral  antidiabetic regimen, with good control. Last Hba1c reviewed was 7.2%, a little higher than before, possibly due to her recent steroid tapers. - We discussed about options for treatment, and I suggested to continue current regimen:  Patient Instructions  Please continue: - Januvia 100 mg in am - Metformin XR (osm) 1000 mg tabs 2x a day with meals  - Invokana 100 mg in am  Please return in 3 months with your sugar log.  - continue checking sugars at different times of the day - check 2 times a day, rotating checks >> advised her to start checking 2h after dinner and at bedtime  - advised her to schedule a new eye exam! - at next visit, we may need to stop Invokana and she will start a liquid diet 2 weeks before the surgery. Around the time of the surgery, she will need to crush Metformin - she is on Metformin XR >> can try to switch to regular metformin but decrease the dose to 500 mg bid for better tolerance. - Return to clinic in 3 mo with sugar log

## 2015-05-03 NOTE — Patient Instructions (Signed)
Please continue: - Januvia 100 mg in am - Metformin XR (osm) 1000 mg tabs 2x a day with meals  - Invokana 100 mg in am  Please return in 3 months with your sugar log.

## 2015-05-20 ENCOUNTER — Other Ambulatory Visit: Payer: Self-pay | Admitting: Internal Medicine

## 2015-06-08 ENCOUNTER — Other Ambulatory Visit: Payer: Self-pay | Admitting: Internal Medicine

## 2015-06-08 ENCOUNTER — Other Ambulatory Visit: Payer: Self-pay

## 2015-06-08 MED ORDER — GLUCOSE BLOOD VI STRP: ORAL_STRIP | Status: DC

## 2015-06-12 ENCOUNTER — Telehealth: Payer: Self-pay

## 2015-06-12 NOTE — Telephone Encounter (Addendum)
PA request for Protonix sent to the office, completed form and refaxed back to them.  Awaiting results.   7/21 Results back and prescription coverage denied for medical necessity.  See form in Dr. Roby LoftsScott's mailbox.  Please advise?

## 2015-06-14 NOTE — Telephone Encounter (Signed)
Noted  

## 2015-07-02 ENCOUNTER — Other Ambulatory Visit: Payer: Self-pay | Admitting: Internal Medicine

## 2015-07-09 ENCOUNTER — Encounter: Payer: Self-pay | Admitting: Internal Medicine

## 2015-07-10 ENCOUNTER — Telehealth: Payer: Self-pay | Admitting: Internal Medicine

## 2015-07-10 ENCOUNTER — Other Ambulatory Visit: Payer: Self-pay | Admitting: *Deleted

## 2015-07-10 MED ORDER — PANTOPRAZOLE SODIUM 40 MG PO TBEC: 40.0000 mg | DELAYED_RELEASE_TABLET | Freq: Every day | ORAL | Status: DC

## 2015-07-10 NOTE — Telephone Encounter (Signed)
My chart message sent to inform protonix was sent in to local pharmacy.

## 2015-07-10 NOTE — Telephone Encounter (Signed)
30 days or reflux medication was sent to local pharmacy & 90 day sent to mail order pharmacy

## 2015-07-10 NOTE — Telephone Encounter (Signed)
I sent in rx for pantoprazole  #30 (take daily) as requested by the patient via my chart.   She apparently needs prior authorization for the protonix.  Thanks.

## 2015-07-31 LAB — HM DIABETES EYE EXAM

## 2015-08-02 ENCOUNTER — Encounter: Payer: Self-pay | Admitting: Internal Medicine

## 2015-08-02 ENCOUNTER — Ambulatory Visit (INDEPENDENT_AMBULATORY_CARE_PROVIDER_SITE_OTHER): Payer: 59 | Admitting: Internal Medicine

## 2015-08-02 VITALS — BP 114/74 | HR 97 | Temp 98.5°F | Ht 61.5 in | Wt 257.0 lb

## 2015-08-02 DIAGNOSIS — E119 Type 2 diabetes mellitus without complications: Secondary | ICD-10-CM | POA: Diagnosis not present

## 2015-08-02 DIAGNOSIS — IMO0001 Reserved for inherently not codable concepts without codable children: Secondary | ICD-10-CM

## 2015-08-02 DIAGNOSIS — R809 Proteinuria, unspecified: Secondary | ICD-10-CM | POA: Diagnosis not present

## 2015-08-02 MED ORDER — METFORMIN HCL 500 MG PO TABS: 500.0000 mg | ORAL_TABLET | Freq: Two times a day (BID) | ORAL | Status: DC

## 2015-08-02 NOTE — Patient Instructions (Addendum)
Please continue: - Metformin XR (osm) 1000 mg tabs 2x a day with meals  - Januvia 100 mg in am - Invokana 100 mg in am  When you need to crush the pills, start: - Metformin (Regular) 500 mg with dinner, then increase to 500 mg 2x a day - Januvia 100 mg in am - Invokana 100 mg in am  If sugars drop after the gastric bypass, you may stop Invokana and if they continue to drop, then stop Januvia, too.  Please return in 3 months with your sugar log.

## 2015-08-02 NOTE — Progress Notes (Signed)
Patient ID: Shirley [name], female   DOB: Oct 17, 1981, 34 y.o.   MRN: [id number]  HPI: Shirley [name] is a 34 y.o.-year-old female, returning for f/u for DM2, dx 2005, non-insulin-dependent, uncontrolled, with complications (MAU). Last visit 3 mo ago.  She will likely have GBP sx (sleeve gastrectomy) later 07/2015.  Last hemoglobin A1c was: 07/15/2015: 7.1% Lab Results  Component Value Date   HGBA1C 7.2* 04/07/2015   HGBA1C 7.0* 12/07/2014   HGBA1C 7.5* 07/01/2014  07/13/2014: 7.2%  - at work 01/2014: 7.8% 08/2013: 7.2%  Pt is on a regimen of: - Januvia 100 mg in am - Metformin XR (osm) 1000 mg tabs 2x a day with meals >> some loose stools - Invokana 100 - added 03/2014 She was on Janumet XR 1000-100 mg po with dinner (used to cut them in half to swallow them better!!!) She was on Metformin in the past >> diarrhea at max dose  Pt checks her sugars 0-3x a day and they are: - am: 130-147 >> 122-165 (1x 188) >> 120-142 >> n/c >> 124-145 >> 135-140 >> 124-162 - 2h after b'fast: 113-171 (1x 207) >> 98-164, 187 x1>> 115-148, 172 >> 133-144, 194 >> 150s >> 125-141 - before lunch: n/c >> 111-129 >> 87, 104-135, 184 >> 98-120 >> 104-149, 161 - 2h after lunch: 140-165 >> 127-199 (1x 213) >> 101-177 >> 144, 153 >> n/c >> 145-160 >> 121-174 - before dinner: 120-141, 195 x1 >> 141, 200 >> 122-160, 181 (snack before leaves work) >> 98-120 >> 131-162 - 2h after dinner: n/c  - bedtime: n/c - nighttime: n/c No lows. Lowest sugar was 111 >> 98;? if  she has hypoglycemia awareness. Highest sugar was 195x1 >> 200 >> 184 >> 200s (steroids) >> 170  Pt's meals are: - Breakfast: sliced apples, 2 Nutrigrain bars - Lunch: leftovers from dinner; sandwich - Dinner: hamburgers; chicken + veggies + starch + cookie or cake on weekends - Snacks: yoghurt ([place]). No more popcorn >> sugars better at dinnertime.  - no CKD, last BUN/creatinine normal:  Lab Results  Component Value Date   BUN 17 04/07/2015    CREATININE 0.8 04/07/2015  She is on Losartan. Last ACR: 30.1 in 07/21/2013 - No HL: Lab Results  Component Value Date   CHOL 176 04/07/2015   HDL 78* 04/07/2015   LDLCALC 82 04/07/2015   TRIG 80 04/07/2015  - last eye exam was in 07/2015: [facility]. No DR.  - no numbness and tingling in her feet.  She also has a history of vit D def., HTN, GERD. Had a normal gastric emptying study.   I reviewed pt's medications, allergies, PMH, social hx, family hx, and changes were documented in the history of present illness. Otherwise, unchanged from my initial visit note.  ROS: Constitutional: no weight gain/loss, no fatigue, no subjective hyperthermia/hypothermia Eyes: no blurry vision, no xerophthalmia ENT: no sore throat, no nodules palpated in throat, no dysphagia/odynophagia, no hoarseness Cardiovascular: no CP/SOB/no palpitations/leg swelling Respiratory: no cough/SOB Gastrointestinal: no N/V/D/C Musculoskeletal: no muscle/no joint aches Skin: no rashes Neurological: no tremors/numbness/tingling/dizziness  PE: BP 114/74 mmHg  Pulse 97  Temp(Src) 98.5 F (36.9 C) (Oral)  Ht 5' 1.5" (1.562 m)  Wt 257 lb (116.574 kg)  BMI 47.78 kg/m2  SpO2 95% Body mass index is 47.78 kg/(m^2). Wt Readings from Last 3 Encounters:  08/02/15 257 lb (116.574 kg)  05/03/15 259 lb (117.482 kg)  04/06/15 260 lb (117.935 kg)   Constitutional: obese class 3 -  see below, in NAD Eyes: PERRLA, EOMI, no exophthalmos ENT: moist mucous membranes, no thyromegaly, no cervical lymphadenopathy Cardiovascular: RRR, No MRG Respiratory: CTA B Gastrointestinal: abdomen soft, NT, ND, BS+ Musculoskeletal: no deformities, strength intact in all 4 Skin: moist, warm, no rashes, acanthosis nigricans on neck; hirsutism face, chest Neurological: no tremor with outstretched hands, DTR normal in all 4   ASSESSMENT: 1. DM2, non-insulin-dependent, uncontrolled, without complications - MAU  PLAN:  1. Patient with  long-standing, fairly well controlled diabetes, on oral antidiabetic regimen, with good control. Last Hba1c reviewed was 7.1%. She will have GBP soon >> given suggestions about her meds at that time: Patient Instructions  Please continue: - Metformin XR (osm) 1000 mg tabs 2x a day with meals  - Januvia 100 mg in am - Invokana 100 mg in am  When you need to crush the pills, start: - Metformin (Regular) 500 mg with dinner, then increase to 500 mg 2x a day - Januvia 100 mg in am - Invokana 100 mg in am  If sugars drop after the gastric bypass, you may stop Invokana and if they continue to drop, then stop Januvia, too.  Please return in 3 months with your sugar log.   - continue checking sugars at different times of the day - check 2 times a day, rotating checks  - UTD with eye exams - Return to clinic in 3 mo with sugar log

## 2015-08-07 ENCOUNTER — Encounter: Payer: Self-pay | Admitting: Internal Medicine

## 2015-08-10 ENCOUNTER — Ambulatory Visit (INDEPENDENT_AMBULATORY_CARE_PROVIDER_SITE_OTHER): Payer: 59 | Admitting: Internal Medicine

## 2015-08-10 ENCOUNTER — Encounter: Payer: Self-pay | Admitting: Internal Medicine

## 2015-08-10 ENCOUNTER — Other Ambulatory Visit: Payer: Self-pay | Admitting: Internal Medicine

## 2015-08-10 VITALS — BP 102/70 | HR 77 | Temp 98.6°F | Resp 18 | Ht 61.5 in | Wt 259.4 lb

## 2015-08-10 DIAGNOSIS — R809 Proteinuria, unspecified: Secondary | ICD-10-CM

## 2015-08-10 DIAGNOSIS — E119 Type 2 diabetes mellitus without complications: Secondary | ICD-10-CM | POA: Diagnosis not present

## 2015-08-10 DIAGNOSIS — Z23 Encounter for immunization: Secondary | ICD-10-CM

## 2015-08-10 DIAGNOSIS — Z658 Other specified problems related to psychosocial circumstances: Secondary | ICD-10-CM

## 2015-08-10 DIAGNOSIS — K219 Gastro-esophageal reflux disease without esophagitis: Secondary | ICD-10-CM

## 2015-08-10 DIAGNOSIS — F439 Reaction to severe stress, unspecified: Secondary | ICD-10-CM

## 2015-08-10 DIAGNOSIS — I10 Essential (primary) hypertension: Secondary | ICD-10-CM

## 2015-08-10 NOTE — Progress Notes (Signed)
Patient ID: Shirley [name], female   DOB: 11-08-1981, 34 y.o.   MRN: [id number]   Subjective:    Patient ID: Shirley [name], female    DOB: [birth year], 34 y.o.   MRN: [id number]  HPI  Patient here to follow up on her blood pressure and her diabetes.  Also here to discuss her upcoming weight loss surgery.  She is seeing Dr Duke Salvia.  Planning for surgery 08/28/15.  Seeing Dr Letta Median for her sugars.  See her note for details.  They discussed medication adjustment after surgery.  She feels good.  No chest pain or tightness.  No sob.  No acid reflux reported.  Only taking protonix q day.  No abdominal pain or cramping.  Bowels stable.  Discussed importance of exercise.  She is not exercising regularly.     Past Medical History  Diagnosis Date  . Depression   . Diabetes mellitus   . Migraine   . Hypertension   . IBS (irritable bowel syndrome)   . GERD (gastroesophageal reflux disease)   . Anemia    Past Surgical History  Procedure Laterality Date  . Cholecystectomy  2005  . 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 Hypertension Mother   . Diabetes Mother   . Asthma Brother   . Breast cancer Neg Hx   . Colon cancer Neg Hx    Social History   Social History  . Marital Status: Single    Spouse Name: N/A  . Number of Children: 0  . Years of Education: N/A   Occupational History  .     Social History Main Topics  . Smoking status: Never Smoker   . Smokeless tobacco: Never Used  . Alcohol Use: No  . Drug Use: No  . Sexual Activity: Not Asked   Other Topics Concern  . None   Social History Narrative    Outpatient Encounter Prescriptions as of 08/10/2015  Medication Sig  . albuterol (PROVENTIL HFA;VENTOLIN HFA) 108 (90 BASE) MCG/ACT inhaler Inhale into the lungs every 6 (six) hours as needed for wheezing or shortness of breath.  Marland Kitchen azelastine (OPTIVAR) 0.05 % ophthalmic solution 1 drop 2 (two) times daily.  . beclomethasone  (QVAR) 40 MCG/ACT inhaler Inhale 1 puff into the lungs daily.  Marland Kitchen glucose blood (BAYER CONTOUR NEXT TEST) test strip Use 1 strip two times daily.  . INVOKANA 100 MG TABS tablet Take 1 tablet by mouth in  the morning  . JANUVIA 100 MG tablet Take 1 tablet by mouth  daily  . levocetirizine (XYZAL) 5 MG tablet Take 5 mg by mouth every evening.  Marland Kitchen losartan-hydrochlorothiazide (HYZAAR) 100-12.5 MG per tablet Take 1 tablet by mouth  daily  . metformin (FORTAMET) 1000 MG (OSM) 24 hr tablet Take 1 tablet (1,000 mg total) by mouth 2 (two) times daily with a meal.  . metFORMIN (GLUCOPHAGE) 500 MG tablet Take 1 tablet (500 mg total) by mouth 2 (two) times daily with a meal.  . Olopatadine HCl (PATANASE) 0.6 % SOLN Place into the nose as needed.  . pantoprazole (PROTONIX) 40 MG tablet Take 1 tablet (40 mg total) by mouth daily.  Marland Kitchen zolpidem (AMBIEN) 5 MG tablet Take 5 mg by mouth at bedtime as needed.  . enoxaparin (LOVENOX) 40 MG/0.4ML injection inject 0.4 milliliters by mouth subcutaneously once daily as directed   No facility-administered encounter medications on file as of 08/10/2015.  Review of Systems  Constitutional: Negative for appetite change and unexpected weight change.  HENT: Negative for congestion and sinus pressure.   Eyes: Negative for discharge and redness.  Respiratory: Negative for cough, chest tightness and shortness of breath.   Cardiovascular: Negative for chest pain, palpitations and leg swelling.  Gastrointestinal: Negative for nausea, vomiting, abdominal pain and diarrhea.  Genitourinary: Negative for dysuria and difficulty urinating.  Musculoskeletal: Negative for back pain and joint swelling.  Skin: Negative for color change and rash.  Neurological: Negative for dizziness, light-headedness and headaches.  Psychiatric/Behavioral: Negative for dysphoric mood and agitation.       Objective:    Physical Exam  Constitutional: She appears well-developed and well-nourished.  No distress.  HENT:  Nose: Nose normal.  Mouth/Throat: Oropharynx is clear and moist.  Eyes: Conjunctivae are normal. Right eye exhibits no discharge. Left eye exhibits no discharge.  Neck: Neck supple. No thyromegaly present.  Cardiovascular: Normal rate and regular rhythm.   Pulmonary/Chest: Breath sounds normal. No respiratory distress. She has no wheezes.  Abdominal: Soft. Bowel sounds are normal. There is no tenderness.  Musculoskeletal: She exhibits no edema or tenderness.  Lymphadenopathy:    She has no cervical adenopathy.  Skin: No rash noted. No erythema.  Psychiatric: She has a normal mood and affect. Her behavior is normal.    BP 102/70 mmHg  Pulse 77  Temp(Src) 98.6 F (37 C) (Oral)  Resp 18  Ht 5' 1.5" (1.562 m)  Wt 259 lb 6 oz (117.652 kg)  BMI 48.22 kg/m2  SpO2 99% Wt Readings from Last 3 Encounters:  08/10/15 259 lb 6 oz (117.652 kg)  08/02/15 257 lb (116.574 kg)  05/03/15 259 lb (117.482 kg)     Lab Results  Component Value Date   WBC 9.4 04/07/2015   HGB 12.9 04/07/2015   HCT 40 04/07/2015   PLT 236 04/07/2015   GLUCOSE 121* 04/22/2013   CHOL 176 04/07/2015   TRIG 80 04/07/2015   HDL 78* 04/07/2015   LDLCALC 82 04/07/2015   ALT 15 04/07/2015   AST 11* 04/07/2015   NA 136* 04/07/2015   K 4.2 04/07/2015   CL 103 04/22/2013   CREATININE 0.8 04/07/2015   BUN 17 04/07/2015   CO2 20 04/22/2013   TSH 1.56 07/01/2014   HGBA1C 7.2* 04/07/2015       Assessment & Plan:   Problem List Items Addressed This Visit    Diabetes mellitus    Followed by Dr Cruzita Lederer.  a1c 7.1 on last check (at work) - per report. Discussed diet and exercise.  Follow met b and a1c.  See endocrinology's note for details.        GERD (gastroesophageal reflux disease)    Symptoms controlled on daily protonix.  Follow.        Hypertension    Blood pressure under good control.  Continue same medication regimen.  Follow pressures.  Follow metabolic panel.        Relevant  Medications   enoxaparin (LOVENOX) 40 MG/0.4ML injection   Microalbuminuria    On losartan.  Follow.        Severe obesity (BMI >= 40)    Have discussed diet and exercise.  She is planning for weight loss surgery.  Follow.        Stress    She is doing better.  Follow.         Other Visit Diagnoses    Encounter for immunization    -  Primary        Einar Pheasant, MD

## 2015-08-10 NOTE — Progress Notes (Signed)
Pre-visit discussion using our clinic review tool. No additional management support is needed unless otherwise documented below in the visit note.  

## 2015-08-11 ENCOUNTER — Encounter: Payer: Self-pay | Admitting: Internal Medicine

## 2015-08-11 LAB — CBC WITH DIFFERENTIAL/PLATELET
Basophils Absolute: 0 10*3/uL (ref 0.0–0.2)
Basos: 0 %
EOS (ABSOLUTE): 0.1 10*3/uL (ref 0.0–0.4)
Eos: 1 %
Hematocrit: 41 % (ref 34.0–46.6)
Hemoglobin: 13 g/dL (ref 11.1–15.9)
Immature Grans (Abs): 0 10*3/uL (ref 0.0–0.1)
Immature Granulocytes: 0 %
Lymphocytes Absolute: 2.9 10*3/uL (ref 0.7–3.1)
Lymphs: 42 %
MCH: 22.9 pg — ABNORMAL LOW (ref 26.6–33.0)
MCHC: 31.7 g/dL (ref 31.5–35.7)
MCV: 72 fL — ABNORMAL LOW (ref 79–97)
Monocytes Absolute: 0.6 10*3/uL (ref 0.1–0.9)
Monocytes: 8 %
Neutrophils Absolute: 3.3 10*3/uL (ref 1.4–7.0)
Neutrophils: 49 %
Platelets: 255 10*3/uL (ref 150–379)
RBC: 5.67 x10E6/uL — ABNORMAL HIGH (ref 3.77–5.28)
RDW: 14.9 % (ref 12.3–15.4)
WBC: 6.8 10*3/uL (ref 3.4–10.8)

## 2015-08-11 LAB — LIPID PANEL WITH LDL/HDL RATIO
Cholesterol, Total: 177 mg/dL (ref 100–199)
HDL: 59 mg/dL (ref >39)
LDL Calculated: 103 mg/dL — ABNORMAL HIGH (ref 0–99)
LDl/HDL Ratio: 1.7 ratio units (ref 0.0–3.2)
Triglycerides: 75 mg/dL (ref 0–149)
VLDL Cholesterol Cal: 15 mg/dL (ref 5–40)

## 2015-08-11 LAB — MICROALBUMIN / CREATININE URINE RATIO
Creatinine, Urine: 107 mg/dL
MICROALB/CREAT RATIO: 12.1 mg/g creat (ref 0.0–30.0)
Microalbumin, Urine: 13 ug/mL

## 2015-08-11 LAB — TSH: TSH: 1.12 u[IU]/mL (ref 0.450–4.500)

## 2015-08-11 LAB — RENAL FUNCTION PANEL
Albumin: 4.2 g/dL (ref 3.5–5.5)
BUN/Creatinine Ratio: 13 (ref 8–20)
BUN: 11 mg/dL (ref 6–20)
CO2: 24 mmol/L (ref 18–29)
Calcium: 9.3 mg/dL (ref 8.7–10.2)
Chloride: 98 mmol/L (ref 97–108)
Creatinine, Ser: 0.83 mg/dL (ref 0.57–1.00)
GFR calc Af Amer: 105 mL/min/{1.73_m2} (ref >59)
GFR calc non Af Amer: 91 mL/min/{1.73_m2} (ref >59)
Glucose: 141 mg/dL — ABNORMAL HIGH (ref 65–99)
Phosphorus: 4 mg/dL (ref 2.5–4.5)
Potassium: 4.2 mmol/L (ref 3.5–5.2)
Sodium: 139 mmol/L (ref 134–144)

## 2015-08-11 LAB — HGB A1C W/O EAG: Hgb A1c MFr Bld: 7.5 % — ABNORMAL HIGH (ref 4.8–5.6)

## 2015-08-11 NOTE — Assessment & Plan Note (Signed)
Blood pressure under good control.  Continue same medication regimen.  Follow pressures.  Follow metabolic panel.   

## 2015-08-11 NOTE — Assessment & Plan Note (Signed)
She is doing better.  Follow.

## 2015-08-11 NOTE — Assessment & Plan Note (Signed)
Have discussed diet and exercise.  She is planning for weight loss surgery.  Follow.

## 2015-08-11 NOTE — Assessment & Plan Note (Signed)
Followed by Dr Gherghe.  a1c 7.1 on last check (at work) - per report. Discussed diet and exercise.  Follow met b and a1c.  See endocrinology's note for details.   

## 2015-08-11 NOTE — Assessment & Plan Note (Signed)
On losartan.  Follow.

## 2015-08-11 NOTE — Assessment & Plan Note (Signed)
Symptoms controlled on daily protonix.  Follow.

## 2015-08-16 ENCOUNTER — Encounter
Admission: RE | Admit: 2015-08-16 | Discharge: 2015-08-16 | Disposition: A | Discharge status: Home / Self Care | Payer: 59 | Source: Ambulatory Visit | Attending: Bariatrics | Admitting: Bariatrics

## 2015-08-16 DIAGNOSIS — Z01812 Encounter for preprocedural laboratory examination: Secondary | ICD-10-CM | POA: Insufficient documentation

## 2015-08-16 HISTORY — DX: Other specified postprocedural states: R11.2

## 2015-08-16 HISTORY — DX: Other complications of anesthesia, initial encounter: T88.59XA

## 2015-08-16 HISTORY — DX: Unspecified asthma, uncomplicated: J45.909

## 2015-08-16 HISTORY — DX: Other specified postprocedural states: Z98.890

## 2015-08-16 HISTORY — DX: Adverse effect of unspecified anesthetic, initial encounter: T41.45XA

## 2015-08-16 HISTORY — DX: Fatty (change of) liver, not elsewhere classified: K76.0

## 2015-08-16 LAB — CBC
HCT: 42 % (ref 35.0–47.0)
Hemoglobin: 13 g/dL (ref 12.0–16.0)
MCH: 22.2 pg — ABNORMAL LOW (ref 26.0–34.0)
MCHC: 31 g/dL — ABNORMAL LOW (ref 32.0–36.0)
MCV: 71.5 fL — ABNORMAL LOW (ref 80.0–100.0)
Platelets: 224 10*3/uL (ref 150–440)
RBC: 5.88 MIL/uL — ABNORMAL HIGH (ref 3.80–5.20)
RDW: 13.9 % (ref 11.5–14.5)
WBC: 6 10*3/uL (ref 3.6–11.0)

## 2015-08-16 LAB — BASIC METABOLIC PANEL
Anion gap: 9 (ref 5–15)
BUN: 14 mg/dL (ref 6–20)
CO2: 25 mmol/L (ref 22–32)
Calcium: 9.3 mg/dL (ref 8.9–10.3)
Chloride: 102 mmol/L (ref 101–111)
Creatinine, Ser: 0.79 mg/dL (ref 0.44–1.00)
GFR calc Af Amer: >60 mL/min (ref >60)
GFR calc non Af Amer: >60 mL/min (ref >60)
Glucose, Bld: 146 mg/dL — ABNORMAL HIGH (ref 65–99)
Potassium: 4 mmol/L (ref 3.5–5.1)
Sodium: 136 mmol/L (ref 135–145)

## 2015-08-16 LAB — ABO/RH: ABO/RH(D): O POS

## 2015-08-16 LAB — TYPE AND SCREEN
ABO/RH(D): O POS
Antibody Screen: NEGATIVE

## 2015-08-16 NOTE — Patient Instructions (Signed)
  Your procedure is scheduled on: Tuesday 08/28/15 Report to Day Surgery. 2ND FLOOR MEDICAL MALL ENTRANCE To find out your arrival time please call [phone] between 1PM - 3PM on Monday 08/27/15.  Remember: Instructions that are not followed completely may result in serious medical risk, up to and including death, or upon the discretion of your surgeon and anesthesiologist your surgery may need to be rescheduled.    __X__ 1. Do not eat food or drink liquids after midnight. No gum chewing or hard candies.     __X__ 2. No Alcohol for 24 hours before or after surgery.   ____ 3. Bring all medications with you on the day of surgery if instructed.    __X__ 4. Notify your doctor if there is any change in your medical condition     (cold, fever, infections).     Do not wear jewelry, make-up, hairpins, clips or nail polish.  Do not wear lotions, powders, or perfumes.   Do not shave 48 hours prior to surgery. Men may shave face and neck.  Do not bring valuables to the hospital.    [facility] is not responsible for any belongings or valuables.               Contacts, dentures or bridgework may not be worn into surgery.  Leave your suitcase in the car. After surgery it may be brought to your room.  For patients admitted to the hospital, discharge time is determined by your                treatment team.   Patients discharged the day of surgery will not be allowed to drive home.   Please read over the following fact sheets that you were given:   Surgical Site Infection Prevention   __X__ Take these medicines the morning of surgery with A SIP OF WATER:    1. PANTOPRAZOLE/PROTONIX  2. USE YOUR ALBUTEROL AND QVAR INHALERS MORNING OF SURGERY  3.   4.  5.  6.  ____ Fleet Enema (as directed)   __X__ Use CHG Soap as directed  __X__ Use inhalers on the day of surgery  __X__ Stop metformin 2 days prior to surgery    ____ Take 1/2 of usual insulin dose the night before surgery and none on  the morning of surgery.   ____ Stop Coumadin/Plavix/aspirin on   ____ Stop Anti-inflammatories on    ____ Stop supplements until after surgery.    ____ Bring C-Pap to the hospital.

## 2015-08-18 ENCOUNTER — Encounter: Payer: Self-pay | Admitting: Internal Medicine

## 2015-08-27 ENCOUNTER — Telehealth: Payer: Self-pay | Admitting: Internal Medicine

## 2015-08-27 NOTE — Telephone Encounter (Signed)
Pt dropped off eHealthScreening from to be filled out. From in  Dr. Roby Lofts box/msn

## 2015-08-28 ENCOUNTER — Encounter: Payer: Self-pay | Admitting: Anesthesiology

## 2015-08-28 ENCOUNTER — Inpatient Hospital Stay: Payer: 59 | Admitting: Anesthesiology

## 2015-08-28 ENCOUNTER — Encounter: Payer: Self-pay | Admitting: *Deleted

## 2015-08-28 ENCOUNTER — Inpatient Hospital Stay
Admission: RE | Admit: 2015-08-28 | Discharge: 2015-09-03 | DRG: 620 | Disposition: A | Discharge status: Home / Self Care | Payer: 59 | Source: Ambulatory Visit | Attending: Internal Medicine | Admitting: Internal Medicine

## 2015-08-28 ENCOUNTER — Encounter
Admission: RE | Disposition: A | Discharge status: Home / Self Care | Payer: Self-pay | Source: Ambulatory Visit | Attending: Internal Medicine

## 2015-08-28 DIAGNOSIS — E119 Type 2 diabetes mellitus without complications: Secondary | ICD-10-CM | POA: Diagnosis present

## 2015-08-28 DIAGNOSIS — D649 Anemia, unspecified: Secondary | ICD-10-CM | POA: Diagnosis present

## 2015-08-28 DIAGNOSIS — K219 Gastro-esophageal reflux disease without esophagitis: Secondary | ICD-10-CM | POA: Diagnosis present

## 2015-08-28 DIAGNOSIS — K449 Diaphragmatic hernia without obstruction or gangrene: Secondary | ICD-10-CM | POA: Diagnosis present

## 2015-08-28 DIAGNOSIS — K760 Fatty (change of) liver, not elsewhere classified: Secondary | ICD-10-CM | POA: Diagnosis present

## 2015-08-28 DIAGNOSIS — E870 Hyperosmolality and hypernatremia: Secondary | ICD-10-CM | POA: Diagnosis not present

## 2015-08-28 DIAGNOSIS — E860 Dehydration: Secondary | ICD-10-CM | POA: Diagnosis not present

## 2015-08-28 DIAGNOSIS — R55 Syncope and collapse: Secondary | ICD-10-CM | POA: Diagnosis not present

## 2015-08-28 DIAGNOSIS — E876 Hypokalemia: Secondary | ICD-10-CM | POA: Diagnosis not present

## 2015-08-28 DIAGNOSIS — K589 Irritable bowel syndrome without diarrhea: Secondary | ICD-10-CM | POA: Diagnosis present

## 2015-08-28 DIAGNOSIS — G43909 Migraine, unspecified, not intractable, without status migrainosus: Secondary | ICD-10-CM | POA: Diagnosis present

## 2015-08-28 DIAGNOSIS — R000 Tachycardia, unspecified: Secondary | ICD-10-CM | POA: Diagnosis not present

## 2015-08-28 DIAGNOSIS — Z79899 Other long term (current) drug therapy: Secondary | ICD-10-CM

## 2015-08-28 DIAGNOSIS — I10 Essential (primary) hypertension: Secondary | ICD-10-CM | POA: Diagnosis present

## 2015-08-28 DIAGNOSIS — Z9884 Bariatric surgery status: Secondary | ICD-10-CM

## 2015-08-28 DIAGNOSIS — K567 Ileus, unspecified: Secondary | ICD-10-CM | POA: Diagnosis not present

## 2015-08-28 DIAGNOSIS — Z886 Allergy status to analgesic agent status: Secondary | ICD-10-CM | POA: Diagnosis not present

## 2015-08-28 DIAGNOSIS — I959 Hypotension, unspecified: Secondary | ICD-10-CM | POA: Diagnosis not present

## 2015-08-28 DIAGNOSIS — Z6841 Body Mass Index (BMI) 40.0 and over, adult: Secondary | ICD-10-CM

## 2015-08-28 DIAGNOSIS — E872 Acidosis: Secondary | ICD-10-CM | POA: Diagnosis not present

## 2015-08-28 DIAGNOSIS — R509 Fever, unspecified: Secondary | ICD-10-CM

## 2015-08-28 DIAGNOSIS — D72829 Elevated white blood cell count, unspecified: Secondary | ICD-10-CM | POA: Diagnosis present

## 2015-08-28 DIAGNOSIS — Z888 Allergy status to other drugs, medicaments and biological substances status: Secondary | ICD-10-CM | POA: Diagnosis not present

## 2015-08-28 DIAGNOSIS — J45909 Unspecified asthma, uncomplicated: Secondary | ICD-10-CM | POA: Diagnosis present

## 2015-08-28 HISTORY — PX: LAPAROSCOPIC GASTRIC RESTRICTIVE DUODENAL PROCEDURE (DUODENAL SWITCH): SHX6667

## 2015-08-28 LAB — CREATININE, SERUM
Creatinine, Ser: 0.92 mg/dL (ref 0.44–1.00)
GFR calc Af Amer: >60 mL/min (ref >60)
GFR calc non Af Amer: >60 mL/min (ref >60)

## 2015-08-28 LAB — CBC
HCT: 42.2 % (ref 35.0–47.0)
Hemoglobin: 12.8 g/dL (ref 12.0–16.0)
MCH: 22.3 pg — ABNORMAL LOW (ref 26.0–34.0)
MCHC: 30.3 g/dL — ABNORMAL LOW (ref 32.0–36.0)
MCV: 73.3 fL — ABNORMAL LOW (ref 80.0–100.0)
Platelets: 198 10*3/uL (ref 150–440)
RBC: 5.76 MIL/uL — ABNORMAL HIGH (ref 3.80–5.20)
RDW: 14.7 % — ABNORMAL HIGH (ref 11.5–14.5)
WBC: 18.1 10*3/uL — ABNORMAL HIGH (ref 3.6–11.0)

## 2015-08-28 LAB — GLUCOSE, CAPILLARY
Glucose-Capillary: 81 mg/dL (ref 65–99)
Glucose-Capillary: 127 mg/dL — ABNORMAL HIGH (ref 65–99)
Glucose-Capillary: 114 mg/dL — ABNORMAL HIGH (ref 65–99)

## 2015-08-28 LAB — POCT PREGNANCY, URINE: Preg Test, Ur: NEGATIVE

## 2015-08-28 SURGERY — LAPAROSCOPIC GASTRIC RESTRICTIVE DUODENAL PROCEDURE (DUODENAL SWITCH)
Anesthesia: General | Wound class: Clean Contaminated

## 2015-08-28 MED ORDER — LOSARTAN POTASSIUM 50 MG PO TABS
100.0000 mg | ORAL_TABLET | Freq: Every day | ORAL | Status: DC
  Administered 2015-08-29: 100 mg via ORAL
  Filled 2015-08-28: qty 2

## 2015-08-28 MED ORDER — PROMETHAZINE HCL 25 MG/ML IJ SOLN
INTRAMUSCULAR | Status: AC
Start: 2015-08-28 — End: 2015-08-28
  Administered 2015-08-28: 12.5 mg via INTRAVENOUS
  Filled 2015-08-28: qty 1

## 2015-08-28 MED ORDER — PANTOPRAZOLE SODIUM 40 MG IV SOLR
40.0000 mg | Freq: Every day | INTRAVENOUS | Status: DC
  Administered 2015-08-28 – 2015-09-02 (×6): 40 mg via INTRAVENOUS
  Filled 2015-08-28 (×5): qty 40

## 2015-08-28 MED ORDER — FENTANYL CITRATE (PF) 100 MCG/2ML IJ SOLN
INTRAMUSCULAR | Status: DC | PRN
  Administered 2015-08-28 (×2): 50 ug via INTRAVENOUS
  Administered 2015-08-28 (×2): 100 ug via INTRAVENOUS
  Administered 2015-08-28 (×2): 50 ug via INTRAVENOUS
  Administered 2015-08-28: 100 ug via INTRAVENOUS

## 2015-08-28 MED ORDER — ROCURONIUM BROMIDE 100 MG/10ML IV SOLN
INTRAVENOUS | Status: DC | PRN
  Administered 2015-08-28: 20 mg via INTRAVENOUS
  Administered 2015-08-28: 40 mg via INTRAVENOUS
  Administered 2015-08-28 (×2): 20 mg via INTRAVENOUS

## 2015-08-28 MED ORDER — DEXTROSE 5 % IV SOLN
3.0000 g | Freq: Once | INTRAVENOUS | Status: AC
  Administered 2015-08-28: 3 g via INTRAVENOUS
  Filled 2015-08-28: qty 3000

## 2015-08-28 MED ORDER — HYDROCHLOROTHIAZIDE 12.5 MG PO CAPS
12.5000 mg | ORAL_CAPSULE | Freq: Every day | ORAL | Status: DC
  Filled 2015-08-28: qty 1

## 2015-08-28 MED ORDER — PNEUMOCOCCAL VAC POLYVALENT 25 MCG/0.5ML IJ INJ
0.5000 mL | INJECTION | INTRAMUSCULAR | Status: AC
  Administered 2015-08-29: 0.5 mL via INTRAMUSCULAR
  Filled 2015-08-28: qty 0.5

## 2015-08-28 MED ORDER — LEVOCETIRIZINE DIHYDROCHLORIDE 5 MG PO TABS: 5.0000 mg | ORAL_TABLET | Freq: Every evening | ORAL | Status: DC

## 2015-08-28 MED ORDER — POTASSIUM CHLORIDE IN NACL 20-0.45 MEQ/L-% IV SOLN
INTRAVENOUS | Status: DC
  Administered 2015-08-28 – 2015-08-29 (×2): via INTRAVENOUS
  Filled 2015-08-28 (×5): qty 1000

## 2015-08-28 MED ORDER — LOSARTAN POTASSIUM-HCTZ 100-12.5 MG PO TABS: 1.0000 | ORAL_TABLET | Freq: Every day | ORAL | Status: DC

## 2015-08-28 MED ORDER — GLYCOPYRROLATE 0.2 MG/ML IJ SOLN
INTRAMUSCULAR | Status: DC | PRN
  Administered 2015-08-28: .6 mg via INTRAVENOUS

## 2015-08-28 MED ORDER — LIDOCAINE HCL (CARDIAC) 20 MG/ML IV SOLN
INTRAVENOUS | Status: DC | PRN
  Administered 2015-08-28: 40 mg via INTRAVENOUS

## 2015-08-28 MED ORDER — FENTANYL CITRATE (PF) 100 MCG/2ML IJ SOLN
INTRAMUSCULAR | Status: AC
  Administered 2015-08-28: 25 ug via INTRAVENOUS
  Filled 2015-08-28: qty 2

## 2015-08-28 MED ORDER — ESMOLOL HCL 10 MG/ML IV SOLN
INTRAVENOUS | Status: DC | PRN
Start: 2015-08-28 — End: 2015-08-28
  Administered 2015-08-28: 20 mg via INTRAVENOUS

## 2015-08-28 MED ORDER — PHENYLEPHRINE HCL 10 MG/ML IJ SOLN
INTRAMUSCULAR | Status: DC | PRN
  Administered 2015-08-28: 100 ug via INTRAVENOUS

## 2015-08-28 MED ORDER — ENOXAPARIN SODIUM 30 MG/0.3ML ~~LOC~~ SOLN: 30.0000 mg | Freq: Two times a day (BID) | SUBCUTANEOUS | Status: DC

## 2015-08-28 MED ORDER — HYDROCODONE-ACETAMINOPHEN 7.5-325 MG/15ML PO SOLN
5.0000 mL | ORAL | Status: DC | PRN
  Administered 2015-08-29 – 2015-09-02 (×8): 10 mL via ORAL
  Filled 2015-08-28 (×8): qty 15

## 2015-08-28 MED ORDER — NEOSTIGMINE METHYLSULFATE 10 MG/10ML IV SOLN
INTRAVENOUS | Status: DC | PRN
  Administered 2015-08-28: 4 mg via INTRAVENOUS

## 2015-08-28 MED ORDER — ACETAMINOPHEN 10 MG/ML IV SOLN
INTRAVENOUS | Status: AC
  Filled 2015-08-28: qty 100

## 2015-08-28 MED ORDER — LORATADINE 10 MG PO TABS
10.0000 mg | ORAL_TABLET | Freq: Every day | ORAL | Status: DC
  Administered 2015-08-29 – 2015-09-03 (×4): 10 mg via ORAL
  Filled 2015-08-28 (×4): qty 1

## 2015-08-28 MED ORDER — SODIUM CHLORIDE 0.9 % IV SOLN
INTRAVENOUS | Status: DC
  Administered 2015-08-28 (×2): via INTRAVENOUS

## 2015-08-28 MED ORDER — KETOTIFEN FUMARATE 0.025 % OP SOLN
1.0000 [drp] | Freq: Two times a day (BID) | OPHTHALMIC | Status: DC
  Administered 2015-08-28 – 2015-09-03 (×12): 1 [drp] via OPHTHALMIC
  Filled 2015-08-28: qty 5

## 2015-08-28 MED ORDER — FENTANYL CITRATE (PF) 100 MCG/2ML IJ SOLN
25.0000 ug | INTRAMUSCULAR | Status: DC | PRN
  Administered 2015-08-28 (×2): 25 ug via INTRAVENOUS

## 2015-08-28 MED ORDER — PROMETHAZINE HCL 25 MG/ML IJ SOLN
6.2500 mg | INTRAMUSCULAR | Status: DC | PRN
  Administered 2015-08-28: 12.5 mg via INTRAVENOUS

## 2015-08-28 MED ORDER — ENOXAPARIN SODIUM 40 MG/0.4ML ~~LOC~~ SOLN
40.0000 mg | Freq: Two times a day (BID) | SUBCUTANEOUS | Status: DC
  Administered 2015-08-29 – 2015-09-03 (×10): 40 mg via SUBCUTANEOUS
  Filled 2015-08-28 (×10): qty 0.4

## 2015-08-28 MED ORDER — SODIUM CHLORIDE 0.9 % IJ SOLN
INTRAMUSCULAR | Status: AC
  Administered 2015-08-28: 14:00:00
  Filled 2015-08-28: qty 10

## 2015-08-28 MED ORDER — MIDAZOLAM HCL 2 MG/2ML IJ SOLN
INTRAMUSCULAR | Status: DC | PRN
  Administered 2015-08-28: 2 mg via INTRAVENOUS

## 2015-08-28 MED ORDER — ZOLPIDEM TARTRATE 5 MG PO TABS: 5.0000 mg | ORAL_TABLET | Freq: Every evening | ORAL | Status: DC | PRN

## 2015-08-28 MED ORDER — BUDESONIDE 0.25 MG/2ML IN SUSP
0.2500 mg | Freq: Two times a day (BID) | RESPIRATORY_TRACT | Status: DC
  Administered 2015-08-28 – 2015-09-03 (×12): 0.25 mg via RESPIRATORY_TRACT
  Filled 2015-08-28 (×12): qty 2

## 2015-08-28 MED ORDER — PROPOFOL 10 MG/ML IV BOLUS
INTRAVENOUS | Status: DC | PRN
  Administered 2015-08-28: 150 mg via INTRAVENOUS

## 2015-08-28 MED ORDER — HYDROMORPHONE HCL 1 MG/ML IJ SOLN
1.0000 mg | INTRAMUSCULAR | Status: DC | PRN
  Administered 2015-08-28 – 2015-08-29 (×4): 2 mg via INTRAVENOUS
  Administered 2015-08-29: 1 mg via INTRAVENOUS
  Administered 2015-08-29 (×2): 2 mg via INTRAVENOUS
  Administered 2015-08-30 (×3): 1 mg via INTRAVENOUS
  Administered 2015-08-30: 2 mg via INTRAVENOUS
  Administered 2015-09-03: 1 mg via INTRAVENOUS
  Filled 2015-08-28: qty 2
  Filled 2015-08-28: qty 1
  Filled 2015-08-28 (×5): qty 2
  Filled 2015-08-28: qty 1
  Filled 2015-08-28: qty 2
  Filled 2015-08-28 (×3): qty 1

## 2015-08-28 MED ORDER — BUPIVACAINE-EPINEPHRINE (PF) 0.25% -1:200000 IJ SOLN
INTRAMUSCULAR | Status: DC | PRN
  Administered 2015-08-28: 60 mL via PERINEURAL

## 2015-08-28 MED ORDER — INSULIN ASPART 100 UNIT/ML ~~LOC~~ SOLN: 0.0000 [IU] | Freq: Every day | SUBCUTANEOUS | Status: DC

## 2015-08-28 MED ORDER — SCOPOLAMINE 1 MG/3DAYS TD PT72
MEDICATED_PATCH | TRANSDERMAL | Status: AC
  Administered 2015-08-28: 08:00:00
  Filled 2015-08-28: qty 1

## 2015-08-28 MED ORDER — ALBUTEROL SULFATE (2.5 MG/3ML) 0.083% IN NEBU: 2.5000 mg | INHALATION_SOLUTION | Freq: Four times a day (QID) | RESPIRATORY_TRACT | Status: DC | PRN

## 2015-08-28 MED ORDER — ONDANSETRON HCL 4 MG/2ML IJ SOLN
INTRAMUSCULAR | Status: DC | PRN
  Administered 2015-08-28: 4 mg via INTRAVENOUS

## 2015-08-28 MED ORDER — EPHEDRINE SULFATE 50 MG/ML IJ SOLN
INTRAMUSCULAR | Status: DC | PRN
  Administered 2015-08-28: 10 mg via INTRAVENOUS

## 2015-08-28 MED ORDER — INSULIN ASPART 100 UNIT/ML ~~LOC~~ SOLN
0.0000 [IU] | Freq: Three times a day (TID) | SUBCUTANEOUS | Status: DC
  Administered 2015-08-29 – 2015-09-01 (×5): 3 [IU] via SUBCUTANEOUS
  Administered 2015-09-02 (×3): 4 [IU] via SUBCUTANEOUS
  Administered 2015-09-03: 3 [IU] via SUBCUTANEOUS
  Administered 2015-09-03: 7 [IU] via SUBCUTANEOUS
  Filled 2015-08-28 (×2): qty 4
  Filled 2015-08-28 (×3): qty 3
  Filled 2015-08-28: qty 2
  Filled 2015-08-28 (×2): qty 3
  Filled 2015-08-28: qty 7

## 2015-08-28 MED ORDER — ACETAMINOPHEN 10 MG/ML IV SOLN
INTRAVENOUS | Status: DC | PRN
  Administered 2015-08-28: 1000 mg via INTRAVENOUS

## 2015-08-28 MED ORDER — BUPIVACAINE-EPINEPHRINE (PF) 0.25% -1:200000 IJ SOLN
INTRAMUSCULAR | Status: AC
  Filled 2015-08-28: qty 60

## 2015-08-28 MED ORDER — ACETAMINOPHEN 160 MG/5ML PO SOLN: 650.0000 mg | ORAL | Status: DC | PRN

## 2015-08-28 MED ORDER — ONDANSETRON HCL 4 MG/2ML IJ SOLN
4.0000 mg | INTRAMUSCULAR | Status: DC | PRN
  Administered 2015-08-28 – 2015-08-29 (×3): 4 mg via INTRAVENOUS
  Filled 2015-08-28 (×3): qty 2

## 2015-08-28 MED ORDER — SCOPOLAMINE 1 MG/3DAYS TD PT72
1.0000 | MEDICATED_PATCH | TRANSDERMAL | Status: DC
  Administered 2015-08-28: 1.5 mg via TRANSDERMAL

## 2015-08-28 SURGICAL SUPPLY — 59 items
APPLIER CLIP ROT 10 11.4 M/L (STAPLE)
BANDAGE ELASTIC 6 CLIP NS LF (GAUZE/BANDAGES/DRESSINGS) ×4 IMPLANT
BLADE SURG SZ11 CARB STEEL (BLADE) ×2 IMPLANT
CANISTER SUCT 1200ML W/VALVE (MISCELLANEOUS) ×2 IMPLANT
CATH TRAY 16F METER LATEX (MISCELLANEOUS) ×2 IMPLANT
CHLORAPREP W/TINT 26ML (MISCELLANEOUS) ×4 IMPLANT
CLIP APPLIE ROT 10 11.4 M/L (STAPLE) IMPLANT
CLIP SUT LAPRA TY ABSORB (SUTURE) ×2 IMPLANT
DEFOGGER SCOPE WARMER CLEARIFY (MISCELLANEOUS) ×2 IMPLANT
DRAPE UTILITY 15X26 TOWEL STRL (DRAPES) ×4 IMPLANT
FILTER LAP SMOKE EVAC STRL (MISCELLANEOUS) ×2 IMPLANT
GLOVE BIO SURGEON STRL SZ7 (GLOVE) ×4 IMPLANT
GLOVE BIO SURGEON STRL SZ7.5 (GLOVE) ×8 IMPLANT
GLOVE BIOGEL PI IND STRL 8.5 (GLOVE) ×1 IMPLANT
GLOVE BIOGEL PI INDICATOR 8.5 (GLOVE) ×1
GLOVE SURG SYN 8.0 (GLOVE) ×2 IMPLANT
GOWN STRL REUS W/ TWL LRG LVL3 (GOWN DISPOSABLE) ×3 IMPLANT
GOWN STRL REUS W/ TWL XL LVL3 (GOWN DISPOSABLE) ×1 IMPLANT
GOWN STRL REUS W/TWL LRG LVL3 (GOWN DISPOSABLE) ×3
GOWN STRL REUS W/TWL XL LVL3 (GOWN DISPOSABLE) ×1
GRASPER 5MX38CM  BARIATRI (MISCELLANEOUS) IMPLANT
GRASPER SUT TROCAR 14GX15 (MISCELLANEOUS) ×2 IMPLANT
IRRIGATION STRYKERFLOW (MISCELLANEOUS) ×1 IMPLANT
IRRIGATOR STRYKERFLOW (MISCELLANEOUS) ×2
IV NS 1000ML (IV SOLUTION) ×1
IV NS 1000ML BAXH (IV SOLUTION) ×1 IMPLANT
KIT RM TURNOVER STRD PROC AR (KITS) ×2 IMPLANT
LABEL OR SOLS (LABEL) IMPLANT
LIQUID BAND (GAUZE/BANDAGES/DRESSINGS) ×2 IMPLANT
NDL SAFETY 22GX1.5 (NEEDLE) ×2 IMPLANT
NS IRRIG 1000ML POUR BTL (IV SOLUTION) ×2 IMPLANT
PACK LAP CHOLECYSTECTOMY (MISCELLANEOUS) ×2 IMPLANT
RELOAD STAPLER BLUE 60MM (STAPLE) ×1 IMPLANT
RELOAD STAPLER GOLD 60MM (STAPLE) ×5 IMPLANT
RELOAD STAPLER GREEN 60MM (STAPLE) ×1 IMPLANT
RELOAD STAPLER WHITE 60MM (STAPLE) ×4 IMPLANT
SHEARS HARMONIC ACE PLUS 45CM (MISCELLANEOUS) ×2 IMPLANT
SLEEVE ENDOPATH XCEL 5M (ENDOMECHANICALS) ×6 IMPLANT
SLEEVE GASTRECTOMY 40FR VISIGI (MISCELLANEOUS) ×2 IMPLANT
STAPLER ECHELON LONG 60 440 (INSTRUMENTS) ×2 IMPLANT
STAPLER RELOAD BLUE 60MM (STAPLE) ×2
STAPLER RELOAD GOLD 60MM (STAPLE) ×10
STAPLER RELOAD GREEN 60MM (STAPLE) ×2
STAPLER RELOAD WHITE 60MM (STAPLE) ×8
SUT DEVICE BRAIDED 0X39 (SUTURE) ×2 IMPLANT
SUT DEVICE BRAIDED 2.0X39 (SUTURE) ×8 IMPLANT
SUT DVC VICRYL PGA 2.0X39 (SUTURE) ×4 IMPLANT
SUT MNCRL AB 4-0 PS2 18 (SUTURE) ×4 IMPLANT
SUT VIC AB 0 UR5 27 (SUTURE) IMPLANT
SUT VIC AB 3-0 SH 27 (SUTURE) ×1
SUT VIC AB 3-0 SH 27X BRD (SUTURE) ×1 IMPLANT
SUT VICRYL/POLYSORB 3.0 (SUTURE) ×4 IMPLANT
SYR 20CC LL (SYRINGE) ×2 IMPLANT
TROCAR 5M 150ML BLDLS (TROCAR) IMPLANT
TROCAR BLADELESS 15MM (ENDOMECHANICALS) IMPLANT
TROCAR XCEL 12X100 BLDLESS (ENDOMECHANICALS) ×2 IMPLANT
TROCAR XCEL NON-BLD 5MMX100MML (ENDOMECHANICALS) ×2 IMPLANT
TUBING INSUFFLATOR HEATED (MISCELLANEOUS) ×2 IMPLANT
WATER STERILE IRR 1000ML POUR (IV SOLUTION) ×2 IMPLANT

## 2015-08-28 NOTE — Telephone Encounter (Signed)
Sent mychart messge to notify patient that form has been placed up front & needs her signature

## 2015-08-28 NOTE — Consult Note (Signed)
[facility] [place] at [facility]   PATIENT NAME: Shirley [name]    MR#:  [id number]  DATE OF BIRTH:  [birth year]  DATE OF CONSULT:  08/28/2015  PRIMARY CARE PHYSICIAN: Dale [place], MD   REQUESTING/REFERRING PHYSICIAN: Dr. Effie Shy  CHIEF COMPLAINT:  No chief complaint on file.  diabetes management and overall medical management  HISTORY OF PRESENT ILLNESS:  Shirley [name]  is a 34 y.o. female with a known history of obesity, type 2 diabetes non-insulin-dependent, hypertension, irritable bowel syndrome, GERD, who presented to the hospital for elective lap gastric bypass. Hospitalist services were contacted for medical and diabetes management. Presently patient is complaining of some lower abdominal pain near her surgical site but denies any nausea, vomiting, fever, chills, chest pain, shortness of breath or any other associated symptoms presently. Patient does have a history of diabetes which is non-insulin-dependent and as per her patient's blood sugars run anywhere between the 90s to low 100s.  PAST MEDICAL HISTORY:   Past Medical History  Diagnosis Date  . Depression   . Diabetes mellitus (HCC)   . Migraine   . Hypertension   . IBS (irritable bowel syndrome)   . GERD (gastroesophageal reflux disease)   . Anemia   . Complication of anesthesia     itching after surgery 2005, 2006  . PONV (postoperative nausea and vomiting)     nausea  . Asthma   . Fatty liver     PAST SURGICAL HISTOIRY:   Past Surgical History  Procedure Laterality Date  . Cholecystectomy  2005  . 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 Laparoscopic gastric restrictive duodenal procedure (duodenal switch) N/A 08/28/2015    Procedure: LAPAROSCOPIC GASTRIC RESTRICTIVE DUODENAL PROCEDURE (DUODENAL SWITCH);  Surgeon: [name], MD;  Location: [facility] ORS;  Service: General;  Laterality: N/A;    SOCIAL HISTORY:   Social History  Substance Use Topics  . Smoking  status: Never Smoker   . Smokeless tobacco: Never Used  . Alcohol Use: No    FAMILY HISTORY:   Family History  Problem Relation Age of Onset  . Hypertension Mother   . Diabetes Mother   . Asthma Brother   . Breast cancer Neg Hx   . Colon cancer Neg Hx     DRUG ALLERGIES:   Allergies  Allergen Reactions  . Caffeine Other (See Comments)    Stomach issues  . Ibuprofen Other (See Comments)    Upset stomach    REVIEW OF SYSTEMS:   Review of Systems  Constitutional: Negative for fever and weight loss.  HENT: Negative for congestion, nosebleeds and tinnitus.   Eyes: Negative for blurred vision, double vision and redness.  Respiratory: Negative for cough, hemoptysis and shortness of breath.   Cardiovascular: Negative for chest pain, orthopnea, leg swelling and PND.  Gastrointestinal: Positive for abdominal pain (Near the surgical site). Negative for nausea, vomiting, diarrhea and melena.  Genitourinary: Negative for dysuria, urgency and hematuria.  Musculoskeletal: Negative for joint pain and falls.  Neurological: Negative for dizziness, tingling, sensory change, focal weakness, seizures, weakness and headaches.  Endo/Heme/Allergies: Negative for polydipsia. Does not bruise/bleed easily.  Psychiatric/Behavioral: Negative for depression and memory loss. The patient is not nervous/anxious.      MEDICATIONS AT HOME:   Prior to Admission medications   Medication Sig Start Date End Date Taking? Authorizing Provider  albuterol (PROVENTIL HFA;VENTOLIN HFA) 108 (90 BASE) MCG/ACT inhaler Inhale into the lungs every 6 (six) hours  as needed for wheezing or shortness of breath.   Yes Historical Provider, MD  azelastine (OPTIVAR) 0.05 % ophthalmic solution 1 drop 2 (two) times daily.   Yes Historical Provider, MD  beclomethasone (QVAR) 40 MCG/ACT inhaler Inhale 1 puff into the lungs daily.   Yes Historical Provider, MD  glucose blood (BAYER CONTOUR NEXT TEST) test strip Use 1 strip two  times daily. 06/08/15  Yes Dale [place], MD  INVOKANA 100 MG TABS tablet Take 1 tablet by mouth in  the morning 06/08/15  Yes Carlus Pavlov, MD  JANUVIA 100 MG tablet Take 1 tablet by mouth  daily 05/21/15  Yes Carlus Pavlov, MD  levocetirizine (XYZAL) 5 MG tablet Take 5 mg by mouth every evening.   Yes Historical Provider, MD  losartan-hydrochlorothiazide (HYZAAR) 100-12.5 MG per tablet Take 1 tablet by mouth  daily 07/02/15  Yes Dale [place], MD  Olopatadine HCl (PATANASE) 0.6 % SOLN Place into the nose as needed.   Yes Historical Provider, MD  pantoprazole (PROTONIX) 40 MG tablet Take 1 tablet (40 mg total) by mouth daily. 07/10/15  Yes Dale [place], MD  zolpidem (AMBIEN) 5 MG tablet Take 5 mg by mouth at bedtime as needed.   Yes Historical Provider, MD  enoxaparin (LOVENOX) 40 MG/0.4ML injection inject 0.4 milliliters by mouth subcutaneously once daily as directed 08/01/15   Historical Provider, MD      VITAL SIGNS:  Blood pressure 107/60, pulse 111, temperature 99.1 F (37.3 C), temperature source Axillary, resp. rate 18, height 5\' 2"  (1.575 m), weight 117.75 kg (259 lb 9.5 oz), last menstrual period 07/28/2015, SpO2 97 %.  PHYSICAL EXAMINATION:  GENERAL:  34 y.o.-year-old obese patient lying in the bed with no acute distress.  EYES: Pupils equal, round, reactive to light and accommodation. No scleral icterus. Extraocular muscles intact.  HEENT: Head atraumatic, normocephalic. Oropharynx and nasopharynx clear.  NECK:  Supple, no jugular venous distention. No thyroid enlargement, no tenderness.  LUNGS: Normal breath sounds bilaterally, no wheezing, rales, rhonchi . No use of accessory muscles of respiration.  CARDIOVASCULAR: S1, S2, RRR. No murmurs, rubs, gallops, clicks.  ABDOMEN: Soft, nontender, nondistended. Hyperactive bowel sounds. No organomegaly or mass.  EXTREMITIES: No pedal edema, cyanosis, or clubbing.  NEUROLOGIC: Cranial nerves II through XII are intact. No focal motor  or sensory deficits appreciated bilaterally  PSYCHIATRIC: The patient is alert and oriented x 3. Good affect SKIN: No obvious rash, lesion, or ulcer.   Foley catheter in place with clear urine draining.    LABORATORY PANEL:   CBC  Recent Labs Lab 08/28/15 1649  WBC 18.1*  HGB 12.8  HCT 42.2  PLT 198   ------------------------------------------------------------------------------------------------------------------  Chemistries   Recent Labs Lab 08/28/15 1649  CREATININE 0.92   ------------------------------------------------------------------------------------------------------------------  Cardiac Enzymes No results for input(s): TROPONINI in the last 168 hours. ------------------------------------------------------------------------------------------------------------------  RADIOLOGY:  No results found.   IMPRESSION AND PLAN:   34 year old female with past medical history morbid obesity, type 2 diabetes, GERD, who presented to the hospital for an elective lap gastric bypass. Hospitalist service contacted for medical management.  #1 status post lap gastric bypass-continue care as per surgery. -Continue bariatric clear liquid diet. Continue DVT prophylaxis.  #2 type 2 diabetes-non-insulin-dependent. -Since patient is going to be on a liquid diet I will not Resume metformin, Invokana, or januvia at this time.  -Place patient on sliding scale insulin and follow blood sugars were now. Once her diet improves her oral hypoglycemics can be reintroduced.  #3 hypertension-continue  losartan/HCTZ.  #4 leukocytosis-likely stress mediated from her recent surgery. Follow WBC.  #5 GERD-continue IV Protonix.   Thanks for the consult will follow along with you.  All the records are reviewed and case discussed with Consulting provider. Management plans discussed with the patient, family and they are in agreement.  CODE STATUS: Full  TOTAL TIME TAKING CARE OF THIS PATIENT:  45 minutes.    Houston Siren M.D on 08/28/2015 at 6:41 PM  Between 7am to 6pm - Pager - [phone]  After 6pm go to [email or web address] - password [email or web address]  [facility]  Office  [phone]  CC: Primary care Physician: Dale Badger, MD

## 2015-08-28 NOTE — Telephone Encounter (Signed)
Placed in red folder  

## 2015-08-28 NOTE — Op Note (Signed)
PATIENT: Shirley [name] 08/08/1981  PROCEDURE PERFORMED: Procedure(s): LAPAROSCOPIC GASTRIC RESTRICTIVE DUODENAL PROCEDURE (DUODENAL SWITCH) (N/A) LAPAROSCOPIC PARAESOPHAGEAL HERNIA REPAIR (N/A) PRE-OP DIAGNOSIS: MORBID OBESITY, GERD WITHOUT HIATAL HERNIA POST-OP DIAGNOSIS: MORBID OBESITY WITH HIATAL HERNIA ESTIMATED BLOOD LOSS: less than 50 mL SURGEON: [name]  ASSISTANT: Anabel Halon, PA  PROCEDURE NOTE: The patient was brought to the operating room and placed in the supine position. General anesthesia was obtained with orotracheal intubation. Foley catheter inserted sterilely. TED hose and Thromboguards applied. A foot board applied at the enatd of the operative bed. The chest and abdomen were sterilely prepped and draped. A 5 mm Optiview trocar introduced under direct visualization in the left upper quadrant of the abdomen. Pneumoperitoneum obtained. Four additional trocars introduced across the upper abdomen. The cecum and distal ileum identified. The small bowel was then followed approximate 300 cm, at which point it was secured to the gastrocolic ligament. A Nathanson liver retractor was introduced followed by elevation of the left lobe of the liver. . The patient then had identification and marking of the pylorus. Perigastric and periduodenal adhesions were movilized by use of the harmonic scalpel to expose the proximal duodenum and pylorus.Beginning approximately 4 cm proximal to this there was division of vascular pedicles along the greater curvature of the stomach, this was extended cephalad over a distance of approximately 8 cm. Inspection of the hiatus was performed.There was noted to be a moderate size indentation in the area of the hiatus consistent with hiatal hernia not noted on preoperative upper GI series but suspected based on preoperative syptoms. It was decided to proceed with repair of this in an effort to diminish the risk of gastroesophageal reflux disease following  creation of sleeve gastrectomy. The patient had division of the gastrohepatic ligament and the peritoneum across the anterior hiatus. Blunt dissection was used to initiate separation of the anterior esophagus and anterior vagal nerve away from the overlying pericardium. The peritoneum was incised just lateral to the right crus and blunt dissection used to reduce herniated lesser sac fatty tissue. The posterior vagal nerve and posterior esophagus then elevated from the underlying aorta and aspects of the right pleura. The upper stomach freed from the undersurface of the left hemidiaphragm and also phrenoesophageal ligaments divided along the medial margin of the left crural musculature. Additional blunt dissection was then performed circumferential to the esophagus, dividing the esophagus from the pleural surfaces on the right and the left side and additional mobilization from the aorta and overlying pericardium. This was extended into the lower mediastinum over a distance of approximately 6-7 cm resulting in delivery of 2 cm of esophagus lying comfortably in the abdominal cavity. A posterior crural repair then performed with 2 interrupted 0 Ethibond sutures Creation of a gastric sleeve effect was then initiated. The patient had a series of GI staple firings placed creating a medially based gastric tube effect. The first firing was a black load stapler placed in a relative transverse direction as was the next gold load firing. These were positioned in a way to avoid narrowing in the region of the incisura. Next a more vertical line of staples was placed parallel to the lesser curvature and ultimately brought out just lateral to the angle of His. As this was done the [address] [place] ViSiGi device which was placed in the antral region was gradually withdrawn to helped create a consistent tubular effect. The lateral stomach then freed from the gastrocolic and gastrosplenic ligament and removed via the right upper  quadrant 12  mm trocar site. The residual vascular pedicles along the lower inner curve of the duodenum bulb were divided by use of the Harmonic scalpel, the peritoneum being scored immediately before this and also the lateral peritoneum also scored. Blunt dissection behind the duodenum divided by use of the Harmonic scalpel. This was ultimately brought out lateral to the duodenum, approximately 3 cm inferior to the pylorus. Because of small bowel bleeding that was difficult to expose it was decided to complete mobilization of the inner curve of the lower stomach and most proximal aspect of the duodenum. This was again done with use of the Harmonic scalpel, a small bleeding point controlled by application of a 5 mm clip applier. The patient then had a blue load stapler introduced across the duodenum again approximately 3 cm inferior to the pylorus. This was used to divide the duodenum with excellent hemostasis. Several residual vascular pedicles on the lateral aspect of the duodenum divided, allowing this to be brought more to the area of the midline. The patient then had an ileal-duodenal anastomosis constructed. This was done with securing seromuscular layers of the distal duodenal staple line area and the antimesenteric border of the ileum. Enterotomies then made on the anterior aspect of the duodenum and opposing portion of the ileum. A full-thickness running 3-0 Polysorb suture used to complete the anastomosis, the suture line then reinforced anteriorly with an additional running 2-0 Polysorb suture. The intestine was occluded distally and insufflation of the gastric tube in area of anastomosis performed. A saline bath performed, no air leak identified. At this point the ViSiGi device was withdrawn.  The jejunum immediate proximal to the duodenal anastomosis was divided with white load gia stapler along with partial division of mesentery with harmonic scalpel. The alimentary limb was followed distally 50 cm at which point a  jejunal jejunal anastomosis created between the new biliary limb and the common channel. This was created by an enterotomy on the antimesenteric margin of each portions of bowel followed by a white load staple firing at 35 mm to creat a common lumen. The resulting enterotomy was closed with a white load staple. Antitorsion sutures placed distal to the anastomosis and the mesenteric window closed with 2.0 surgidec suture.A portion of the divided gastrocolic ligament was secured to the lateral margins of the gastric sleeve to assure a consistent, somewhat rounded entry into the distal stomach. At this point the fascia and peritoneum of the 12 mm trocar site was closed with 0 Vicryl suture as passed by a needle suture passing system under direct visualization. The pneumoperitoneum relieved. The trocars removed. The wounds injected with 0.25% Marcaine and closed with 4-0 Monocryl in the dermis followed by Dermabond.

## 2015-08-28 NOTE — Interval H&P Note (Signed)
History and Physical Interval Note:  08/28/2015 10:22 AM  Shirley [name]  has presented today for surgery, with the diagnosis of MORBID OBESITY  The various methods of treatment have been discussed with the patient and family. After consideration of risks, benefits and other options for treatment, the patient has consented to  Procedure(s): LAPAROSCOPIC GASTRIC RESTRICTIVE DUODENAL PROCEDURE (DUODENAL SWITCH) (N/A) as a surgical intervention .  The patient's history has been reviewed, patient examined, no change in status, stable for surgery.  I have reviewed the patient's chart and labs.  Questions were answered to the patient's satisfaction.     [name]

## 2015-08-28 NOTE — Anesthesia Procedure Notes (Signed)
Procedure Name: Intubation Date/Time: 08/28/2015 10:05 AM Performed by: Henrietta Hoover Pre-anesthesia Checklist: Patient identified, Emergency Drugs available, Suction available, Patient being monitored and Timeout performed Patient Re-evaluated:Patient Re-evaluated prior to inductionOxygen Delivery Method: Circle system utilized Preoxygenation: Pre-oxygenation with 100% oxygen Intubation Type: IV induction Ventilation: Mask ventilation without difficulty Laryngoscope Size: Mac and 3 Grade View: Grade I Tube type: Oral Tube size: 7.0 mm Number of attempts: 2 Airway Equipment and Method: Stylet Placement Confirmation: ETT inserted through vocal cords under direct vision,  positive ETCO2 and breath sounds checked- equal and bilateral Secured at: 22 cm Tube secured with: Tape Dental Injury: Teeth and Oropharynx as per pre-operative assessment

## 2015-08-28 NOTE — Telephone Encounter (Signed)
I have completed her form.  She must complete and sign her part before it can be sent in.  Placed in your box.

## 2015-08-28 NOTE — Anesthesia Preprocedure Evaluation (Signed)
Anesthesia Evaluation  Patient identified by MRN, date of birth, ID band Patient awake    Reviewed: Allergy & Precautions, H&P , NPO status , Patient's Chart, lab work & pertinent test results, reviewed documented beta blocker date and time   History of Anesthesia Complications (+) PONV and history of anesthetic complications  Airway Mallampati: III  TM Distance: >3 FB Neck ROM: full    Dental no notable dental hx. (+) Caps   Pulmonary neg shortness of breath, asthma , sleep apnea (Not bad enough to treat) , neg COPD, neg recent URI,    Pulmonary exam normal breath sounds clear to auscultation       Cardiovascular Exercise Tolerance: Good hypertension, (-) angina(-) CAD, (-) Past MI, (-) Cardiac Stents and (-) CABG Normal cardiovascular exam(-) dysrhythmias + Valvular Problems/Murmurs  Rhythm:regular Rate:Normal     Neuro/Psych PSYCHIATRIC DISORDERS (depression) negative neurological ROS     GI/Hepatic GERD  Medicated and Controlled,Fatty liver disease   Endo/Other  diabetes, Well Controlled, Oral Hypoglycemic AgentsMorbid obesity  Renal/GU negative Renal ROS  negative genitourinary   Musculoskeletal   Abdominal   Peds  Hematology  (+) Blood dyscrasia, anemia ,   Anesthesia Other Findings Past Medical History:   Depression                                                   Diabetes mellitus (HCC)                                      Migraine                                                     Hypertension                                                 IBS (irritable bowel syndrome)                               GERD (gastroesophageal reflux disease)                       Anemia                                                       Complication of anesthesia                                     Comment:itching after surgery 2005, 2006   PONV (postoperative nausea and vomiting)                        Comment:nausea   Asthma  Fatty liver                                                  Reproductive/Obstetrics negative OB ROS                             Anesthesia Physical Anesthesia Plan  ASA: III  Anesthesia Plan: General   Post-op Pain Management:    Induction:   Airway Management Planned:   Additional Equipment:   Intra-op Plan:   Post-operative Plan:   Informed Consent: I have reviewed the patients History and Physical, chart, labs and discussed the procedure including the risks, benefits and alternatives for the proposed anesthesia with the patient or authorized representative who has indicated his/her understanding and acceptance.   Dental Advisory Given  Plan Discussed with: Anesthesiologist, CRNA and Surgeon  Anesthesia Plan Comments:         Anesthesia Quick Evaluation

## 2015-08-28 NOTE — Transfer of Care (Signed)
Immediate Anesthesia Transfer of Care Note  Patient: Shirley [name]  Procedure(s) Performed: Procedure(s): LAPAROSCOPIC GASTRIC RESTRICTIVE DUODENAL PROCEDURE (DUODENAL SWITCH) (N/A) LAPAROSCOPIC PARAESOPHAGEAL HERNIA REPAIR (N/A)  Patient Location: PACU  Anesthesia Type:General  Level of Consciousness: awake  Airway & Oxygen Therapy: Patient Spontanous Breathing and Patient connected to face mask oxygen  Post-op Assessment: Report given to RN and Post -op Vital signs reviewed and stable  Post vital signs: Reviewed and stable  Last Vitals:  Filed Vitals:   08/28/15 1402  BP: 136/84  Pulse: 102  Temp: 37.9 C  Resp: 17    Complications: No apparent anesthesia complications

## 2015-08-28 NOTE — H&P (Signed)
History and physical on paper chart,  Lungs:clear Card: RRR and Nl sinus rhythm. Abdomen soft without hernia or mass

## 2015-08-29 ENCOUNTER — Inpatient Hospital Stay: Payer: 59

## 2015-08-29 LAB — COMPREHENSIVE METABOLIC PANEL
ALT: 273 U/L — ABNORMAL HIGH (ref 14–54)
AST: 305 U/L — ABNORMAL HIGH (ref 15–41)
Albumin: 4 g/dL (ref 3.5–5.0)
Alkaline Phosphatase: 78 U/L (ref 38–126)
Anion gap: 14 (ref 5–15)
BUN: 10 mg/dL (ref 6–20)
CO2: 12 mmol/L — ABNORMAL LOW (ref 22–32)
Calcium: 8.7 mg/dL — ABNORMAL LOW (ref 8.9–10.3)
Chloride: 111 mmol/L (ref 101–111)
Creatinine, Ser: 1.13 mg/dL — ABNORMAL HIGH (ref 0.44–1.00)
GFR calc Af Amer: >60 mL/min (ref >60)
GFR calc non Af Amer: >60 mL/min (ref >60)
Glucose, Bld: 151 mg/dL — ABNORMAL HIGH (ref 65–99)
Potassium: 5.2 mmol/L — ABNORMAL HIGH (ref 3.5–5.1)
Sodium: 137 mmol/L (ref 135–145)
Total Bilirubin: 1.1 mg/dL (ref 0.3–1.2)
Total Protein: 7.6 g/dL (ref 6.5–8.1)

## 2015-08-29 LAB — GLUCOSE, CAPILLARY
Glucose-Capillary: 138 mg/dL — ABNORMAL HIGH (ref 65–99)
Glucose-Capillary: 126 mg/dL — ABNORMAL HIGH (ref 65–99)
Glucose-Capillary: 132 mg/dL — ABNORMAL HIGH (ref 65–99)
Glucose-Capillary: 123 mg/dL — ABNORMAL HIGH (ref 65–99)
Glucose-Capillary: 110 mg/dL — ABNORMAL HIGH (ref 65–99)
Glucose-Capillary: 110 mg/dL — ABNORMAL HIGH (ref 65–99)

## 2015-08-29 LAB — CBC WITH DIFFERENTIAL/PLATELET
Basophils Absolute: 0.1 10*3/uL (ref 0–0.1)
Basophils Relative: 0 %
Eosinophils Absolute: 0 10*3/uL (ref 0–0.7)
Eosinophils Relative: 0 %
HCT: 41.5 % (ref 35.0–47.0)
Hemoglobin: 12.5 g/dL (ref 12.0–16.0)
Lymphocytes Relative: 11 %
Lymphs Abs: 1.9 10*3/uL (ref 1.0–3.6)
MCH: 22.4 pg — ABNORMAL LOW (ref 26.0–34.0)
MCHC: 30.1 g/dL — ABNORMAL LOW (ref 32.0–36.0)
MCV: 74.6 fL — ABNORMAL LOW (ref 80.0–100.0)
Monocytes Absolute: 0.5 10*3/uL (ref 0.2–0.9)
Monocytes Relative: 3 %
Neutro Abs: 14.7 10*3/uL — ABNORMAL HIGH (ref 1.4–6.5)
Neutrophils Relative %: 86 %
Platelets: 208 10*3/uL (ref 150–440)
RBC: 5.56 MIL/uL — ABNORMAL HIGH (ref 3.80–5.20)
RDW: 15.1 % — ABNORMAL HIGH (ref 11.5–14.5)
WBC: 17.2 10*3/uL — ABNORMAL HIGH (ref 3.6–11.0)

## 2015-08-29 LAB — CBC
HCT: 42.9 % (ref 35.0–47.0)
Hemoglobin: 12.9 g/dL (ref 12.0–16.0)
MCH: 22.2 pg — ABNORMAL LOW (ref 26.0–34.0)
MCHC: 30 g/dL — ABNORMAL LOW (ref 32.0–36.0)
MCV: 73.8 fL — ABNORMAL LOW (ref 80.0–100.0)
Platelets: 210 10*3/uL (ref 150–440)
RBC: 5.81 MIL/uL — ABNORMAL HIGH (ref 3.80–5.20)
RDW: 15 % — ABNORMAL HIGH (ref 11.5–14.5)
WBC: 15.6 10*3/uL — ABNORMAL HIGH (ref 3.6–11.0)

## 2015-08-29 LAB — SURGICAL PATHOLOGY

## 2015-08-29 MED ORDER — CANAGLIFLOZIN 100 MG PO TABS: 100.0000 mg | ORAL_TABLET | Freq: Every day | ORAL | Status: DC

## 2015-08-29 MED ORDER — SODIUM CHLORIDE 0.9 % IV SOLN
INTRAVENOUS | Status: DC
  Administered 2015-08-29 – 2015-08-31 (×6): via INTRAVENOUS

## 2015-08-29 MED ORDER — DIPHENHYDRAMINE HCL 50 MG/ML IJ SOLN
25.0000 mg | Freq: Once | INTRAMUSCULAR | Status: AC
  Administered 2015-08-29: 25 mg via INTRAVENOUS

## 2015-08-29 MED ORDER — UNJURY CHICKEN SOUP POWDER
2.0000 [oz_av] | Freq: Three times a day (TID) | ORAL | Status: DC
  Administered 2015-08-29 – 2015-09-03 (×10): 2 [oz_av] via ORAL

## 2015-08-29 MED ORDER — DIPHENHYDRAMINE HCL 25 MG PO CAPS
25.0000 mg | ORAL_CAPSULE | Freq: Three times a day (TID) | ORAL | Status: DC | PRN
  Administered 2015-08-29 – 2015-08-31 (×3): 25 mg via ORAL
  Filled 2015-08-29 (×3): qty 1

## 2015-08-29 MED ORDER — SODIUM CHLORIDE 0.9 % IV BOLUS (SEPSIS)
1000.0000 mL | Freq: Once | INTRAVENOUS | Status: AC
  Administered 2015-08-29: 1000 mL via INTRAVENOUS

## 2015-08-29 MED ORDER — DIPHENHYDRAMINE HCL 50 MG/ML IJ SOLN
INTRAMUSCULAR | Status: AC
  Filled 2015-08-29: qty 1

## 2015-08-29 NOTE — Progress Notes (Signed)
Notified Dr Enedina Finner of pt request for benadryl d/t itching; Dr ordered 25 mg PO TID PRN

## 2015-08-29 NOTE — Progress Notes (Signed)
[facility] - [place] [facility]   PATIENT NAME: Shirley [name]    MR#:  [id number]  DATE OF BIRTH:  12/08/1980  SUBJECTIVE:  i am hungry  REVIEW OF SYSTEMS:   Review of Systems  Constitutional: Negative for fever, chills and weight loss.  HENT: Negative for ear discharge, ear pain and nosebleeds.   Eyes: Negative for blurred vision, pain and discharge.  Respiratory: Negative for sputum production, shortness of breath, wheezing and stridor.   Cardiovascular: Negative for chest pain, palpitations, orthopnea and PND.  Gastrointestinal: Positive for abdominal pain. Negative for nausea, vomiting and diarrhea.  Genitourinary: Negative for urgency and frequency.  Musculoskeletal: Negative for back pain and joint pain.  Neurological: Negative for sensory change, speech change, focal weakness and weakness.  Psychiatric/Behavioral: Negative for depression and hallucinations. The patient is not nervous/anxious.    Tolerating Diet:npo Tolerating PT: not needed  DRUG ALLERGIES:   Allergies  Allergen Reactions  . Caffeine Other (See Comments)    Stomach issues  . Ibuprofen Other (See Comments)    Upset stomach    VITALS:  Blood pressure 127/74, pulse 108, temperature 99 F (37.2 C), temperature source Oral, resp. rate 18, height  (1.575 m), weight 117.75 kg (259 lb 9.5 oz), last menstrual period 07/28/2015, SpO2 100 %.  PHYSICAL EXAMINATION:   Physical Exam  GENERAL:  34 y.o.-year-old patient lying in the bed with no acute distress.  EYES: Pupils equal, round, reactive to light and accommodation. No scleral icterus. Extraocular muscles intact.  HEENT: Head atraumatic, normocephalic. Oropharynx and nasopharynx clear.  NECK:  Supple, no jugular venous distention. No thyroid enlargement, no tenderness.  LUNGS: Normal breath sounds bilaterally, no wheezing, rales, rhonchi. No use of accessory muscles of respiration.  CARDIOVASCULAR: S1, S2 normal. No  murmurs, rubs, or gallops.  ABDOMEN: Soft, nontender, nondistended. Bowel sounds present. No organomegaly or mass.  EXTREMITIES: No cyanosis, clubbing or edema b/l.    NEUROLOGIC: Cranial nerves II through XII are intact. No focal Motor or sensory deficits b/l.   PSYCHIATRIC: The patient is alert and oriented x 3.  SKIN: No obvious rash, lesion, or ulcer.    LABORATORY PANEL:   CBC  Recent Labs Lab 08/29/15 0609  WBC 17.2*  HGB 12.5  HCT 41.5  PLT 208    Chemistries   Recent Labs Lab 08/29/15 0609  NA 137  K 5.2*  CL 111  CO2 12*  GLUCOSE 151*  BUN 10  CREATININE 1.13*  CALCIUM 8.7*  AST 305*  ALT 273*  ALKPHOS 78  BILITOT 1.1    Cardiac Enzymes No results for input(s): TROPONINI in the last 168 hours.  RADIOLOGY:  Dg Ugi W/water Sol Cm  08/29/2015   CLINICAL DATA:  Post postop bariatric gastric bypass  EXAM: WATER SOLUBLE UPPER GI SERIES  TECHNIQUE: Single-column upper GI series was performed using water soluble contrast.  CONTRAST:  Gastrografin 60 cc  COMPARISON:  Upper GI series of February 12, 2015  FLUOROSCOPY TIME:  Fluoroscopy Time (in minutes and seconds): 0 minutes, 7 seconds  Number of Acquired Images:  1 cine loop and 4 spot images  FINDINGS: The patient ingested the Gastrografin without difficulty. The distal esophagus revealed no acute abnormality. The barium passed into the stomach without difficulty. No extraluminal contrast was observed.  IMPRESSION: There is no evidence of a leak of the ingested contrast material.   Electronically Signed   By: David  [place] M.D.   On: 08/29/2015 10:25  ASSESSMENT AND PLAN:   34 year old female with past medical history morbid obesity, type 2 diabetes, GERD, who presented to the hospital for an elective lap gastric bypass. Hospitalist service contacted for medical management.  #1 status post lap gastric bypass-continue care as per surgery. -Continue bariatric clear liquid diet. Continue DVT prophylaxis.  #2  type 2 diabetes-non-insulin-dependent. - will metformin, and Invokana, and resume januvia at discharge  #3 hypertension-continue losartan/HCTZ.  #4 leukocytosis-likely stress mediated from her recent surgery. Follow WBC.  #5 GERD-Protonix.   Case discussed with Care Management/Social Worker. Management plans discussed with the patient, family and they are in agreement.  CODE STATUS: full  TOTAL TIME TAKING CARE OF THIS PATIENT:30 minutes.  >50% time spent on counselling and coordination of care pt and family   Izreal Kock M.D on 08/29/2015 at 2:17 PM  Between 7am to 6pm - Pager - [phone]  After 6pm go to [email or web address] - password [email or web address] [facility]  [facility]  Office  [phone]  CC: Primary care physician; [place] [facility] [place], MD

## 2015-08-29 NOTE — Progress Notes (Signed)
Still with what seems to be incisional pain. nontender in lower abdomen.  Good use of IS. Some ambulation and voiding without foley. Patient not sure of concentration.  Contrast study without leak but not followed past DI anastomosis.  Tachycardia persist but bp normal and no tachypnea or distress.  Will add fluid bolus and recheck labs in am. Presume home in am. CT scan for any clinical change.

## 2015-08-29 NOTE — Progress Notes (Signed)
Subjective: Interval History: has complaints of incisional pain.ambulated x 1. Good use of IS.  Objective: Vital signs in last 24 hours: Temp:  [98.2 F (36.8 C)-100.3 F (37.9 C)] 99 F (37.2 C) (10/05 0807) Pulse Rate:  [86-115] 108 (10/05 0807) Resp:  [16-28] 18 (10/05 0807) BP: (91-142)/(47-97) 127/74 mmHg (10/05 0807) SpO2:  [97 %-100 %] 100 % (10/05 0821) Weight:  [117.75 kg (259 lb 9.5 oz)] 117.75 kg (259 lb 9.5 oz) (10/04 1621) Lungs clear/ abd benign with tenderness along incisions. Intake/Output from previous day: 10/04 0701 - 10/05 0700 In: 3642.9 [P.O.:120; I.V.:3522.9] Out: 3935 [Urine:3925] Intake/Output this shift: Total I/O In: -  Out: 600 [Urine:600]    Results for orders placed or performed during the hospital encounter of 08/28/15 (from the past 24 hour(s))  Pregnancy, urine POC     Status: None   Collection Time: 08/28/15  8:38 AM  Result Value Ref Range   Preg Test, Ur NEGATIVE NEGATIVE  Glucose, capillary     Status: Abnormal   Collection Time: 08/28/15  2:06 PM  Result Value Ref Range   Glucose-Capillary 127 (H) 65 - 99 mg/dL  CBC     Status: Abnormal   Collection Time: 08/28/15  4:49 PM  Result Value Ref Range   WBC 18.1 (H) 3.6 - 11.0 K/uL   RBC 5.76 (H) 3.80 - 5.20 MIL/uL   Hemoglobin 12.8 12.0 - 16.0 g/dL   HCT 29.5 28.4 - 13.2 %   MCV 73.3 (L) 80.0 - 100.0 fL   MCH 22.3 (L) 26.0 - 34.0 pg   MCHC 30.3 (L) 32.0 - 36.0 g/dL   RDW 44.0 (H) 10.2 - 72.5 %   Platelets 198 150 - 440 K/uL  Creatinine, serum     Status: None   Collection Time: 08/28/15  4:49 PM  Result Value Ref Range   Creatinine, Ser 0.92 0.44 - 1.00 mg/dL   GFR calc non Af Amer >60 >60 mL/min   GFR calc Af Amer >60 >60 mL/min  Glucose, capillary     Status: Abnormal   Collection Time: 08/28/15  8:50 PM  Result Value Ref Range   Glucose-Capillary 114 (H) 65 - 99 mg/dL  CBC WITH DIFFERENTIAL     Status: Abnormal   Collection Time: 08/29/15  6:09 AM  Result Value Ref Range    WBC 17.2 (H) 3.6 - 11.0 K/uL   RBC 5.56 (H) 3.80 - 5.20 MIL/uL   Hemoglobin 12.5 12.0 - 16.0 g/dL   HCT 36.6 44.0 - 34.7 %   MCV 74.6 (L) 80.0 - 100.0 fL   MCH 22.4 (L) 26.0 - 34.0 pg   MCHC 30.1 (L) 32.0 - 36.0 g/dL   RDW 42.5 (H) 95.6 - 38.7 %   Platelets 208 150 - 440 K/uL   Neutrophils Relative % 86% %   Neutro Abs 14.7 (H) 1.4 - 6.5 K/uL   Lymphocytes Relative 11% %   Lymphs Abs 1.9 1.0 - 3.6 K/uL   Monocytes Relative 3% %   Monocytes Absolute 0.5 0.2 - 0.9 K/uL   Eosinophils Relative 0% %   Eosinophils Absolute 0.0 0 - 0.7 K/uL   Basophils Relative 0% %   Basophils Absolute 0.1 0 - 0.1 K/uL  Comprehensive metabolic panel     Status: Abnormal   Collection Time: 08/29/15  6:09 AM  Result Value Ref Range   Sodium 137 135 - 145 mmol/L   Potassium 5.2 (H) 3.5 - 5.1 mmol/L   Chloride 111  101 - 111 mmol/L   CO2 12 (L) 22 - 32 mmol/L   Glucose, Bld 151 (H) 65 - 99 mg/dL   BUN 10 6 - 20 mg/dL   Creatinine, Ser 6.04 (H) 0.44 - 1.00 mg/dL   Calcium 8.7 (L) 8.9 - 10.3 mg/dL   Total Protein 7.6 6.5 - 8.1 g/dL   Albumin 4.0 3.5 - 5.0 g/dL   AST 540 (H) 15 - 41 U/L   ALT 273 (H) 14 - 54 U/L   Alkaline Phosphatase 78 38 - 126 U/L   Total Bilirubin 1.1 0.3 - 1.2 mg/dL   GFR calc non Af Amer >60 >60 mL/min   GFR calc Af Amer >60 >60 mL/min   Anion gap 14 5 - 15  Glucose, capillary     Status: Abnormal   Collection Time: 08/29/15  7:38 AM  Result Value Ref Range   Glucose-Capillary 138 (H) 65 - 99 mg/dL    Studies/Results: No results found.  Scheduled Meds: . budesonide (PULMICORT) nebulizer solution  0.25 mg Nebulization BID  . diphenhydrAMINE      . enoxaparin  40 mg Subcutaneous Q12H  . insulin aspart  0-20 Units Subcutaneous TID WC  . insulin aspart  0-5 Units Subcutaneous QHS  . ketotifen  1 drop Both Eyes BID  . loratadine  10 mg Oral Daily  . losartan  100 mg Oral Daily  . pantoprazole (PROTONIX) IV  40 mg Intravenous QHS  . pneumococcal 23 valent vaccine  0.5  mL Intramuscular Tomorrow-1000   Continuous Infusions: . 0.45 % NaCl with KCl 20 mEq / L 125 mL/hr at 08/29/15 0312   PRN Meds:acetaminophen (TYLENOL) oral liquid 160 mg/5 mL, albuterol, HYDROcodone-acetaminophen, HYDROmorphone (DILAUDID) injection, ondansetron (ZOFRAN) IV, zolpidem  Assessment/Plan:  low grade temp, and elevated wbc, no apparent distress, will just confirm no leak by contrast study.   LOS: 1 day   Shirley [name]

## 2015-08-29 NOTE — Progress Notes (Signed)
Per his request, notified Dr Alva Garnet of pt scan results; Dr acknowledged and ordered that pt may resume previous diet

## 2015-08-29 NOTE — Progress Notes (Signed)
INTERVENTION:  RD consulted for nutrition education regarding inpatient bariatric surgery.   RD provided "The Liquid Diet" handout from the Bariatric Surgery Guide from the [facility] of . This handout previously provided to patient prior to surgery is a duplicate copy. Discussed what foods/liquids are consistent with a Clear Liquid Diet and reinforced Key Concepts such as no carbonation, no caffeine, or sugar containing beverages. Provided methods to prevent dehydration and promote protein intake, using clock and sample fluid schedule. RD encouraged follow-up with outpatient dietitian after discharge.  Teach back method used.  Expect good compliance.  NUTRITION DIAGNOSIS:  Food and nutrition knowledge related deficit related to recent bariatric surgery as evidenced by dietitian consult for nutrition education   GOAL:  Patient will be able to sip and tolerate CL within 24-48 hours  MONITOR:  Energy intake Digestive system  ASSESSMENT:  Pt s/p lap gastric restrictive duodenal switch. Planning Upper GI this am, elevated WBC  Past Medical History  Diagnosis Date  . Depression   . Diabetes mellitus (HCC)   . Migraine   . Hypertension   . IBS (irritable bowel syndrome)   . GERD (gastroesophageal reflux disease)   . Anemia   . Complication of anesthesia     itching after surgery 2005, 2006  . PONV (postoperative nausea and vomiting)     nausea  . Asthma   . Fatty liver      Body mass index is 47.47 kg/(m^2).   Current diet order is  NPO.  Labs and medications reviewed.   LOW Care Level  Shirley [name], RD, LDN [phone] (pager)

## 2015-08-29 NOTE — Progress Notes (Signed)
Patient ambulated 3 laps around the nursing station. Patient received benadryl for itching.

## 2015-08-30 ENCOUNTER — Encounter: Payer: Self-pay | Admitting: Anesthesiology

## 2015-08-30 ENCOUNTER — Encounter
Admission: RE | Disposition: A | Discharge status: Home / Self Care | Payer: Self-pay | Source: Ambulatory Visit | Attending: Internal Medicine

## 2015-08-30 ENCOUNTER — Inpatient Hospital Stay: Payer: 59

## 2015-08-30 LAB — GLUCOSE, CAPILLARY
Glucose-Capillary: 144 mg/dL — ABNORMAL HIGH (ref 65–99)
Glucose-Capillary: 104 mg/dL — ABNORMAL HIGH (ref 65–99)
Glucose-Capillary: 106 mg/dL — ABNORMAL HIGH (ref 65–99)
Glucose-Capillary: 99 mg/dL (ref 65–99)

## 2015-08-30 LAB — CBC WITH DIFFERENTIAL/PLATELET
Basophils Absolute: 0.1 10*3/uL (ref 0–0.1)
Basophils Relative: 1 %
Eosinophils Absolute: 0 10*3/uL (ref 0–0.7)
Eosinophils Relative: 0 %
HCT: 40.2 % (ref 35.0–47.0)
Hemoglobin: 12.2 g/dL (ref 12.0–16.0)
Lymphocytes Relative: 15 %
Lymphs Abs: 2.1 10*3/uL (ref 1.0–3.6)
MCH: 22.5 pg — ABNORMAL LOW (ref 26.0–34.0)
MCHC: 30.3 g/dL — ABNORMAL LOW (ref 32.0–36.0)
MCV: 74.3 fL — ABNORMAL LOW (ref 80.0–100.0)
Monocytes Absolute: 0.9 10*3/uL (ref 0.2–0.9)
Monocytes Relative: 7 %
Neutro Abs: 10.5 10*3/uL — ABNORMAL HIGH (ref 1.4–6.5)
Neutrophils Relative %: 77 %
Platelets: 180 10*3/uL (ref 150–440)
RBC: 5.42 MIL/uL — ABNORMAL HIGH (ref 3.80–5.20)
RDW: 14.8 % — ABNORMAL HIGH (ref 11.5–14.5)
WBC: 13.6 10*3/uL — ABNORMAL HIGH (ref 3.6–11.0)

## 2015-08-30 LAB — BASIC METABOLIC PANEL
Anion gap: 6 (ref 5–15)
BUN: 14 mg/dL (ref 6–20)
CO2: 12 mmol/L — ABNORMAL LOW (ref 22–32)
Calcium: 8.2 mg/dL — ABNORMAL LOW (ref 8.9–10.3)
Chloride: 119 mmol/L — ABNORMAL HIGH (ref 101–111)
Creatinine, Ser: 1.16 mg/dL — ABNORMAL HIGH (ref 0.44–1.00)
GFR calc Af Amer: >60 mL/min (ref >60)
GFR calc non Af Amer: 60 mL/min — ABNORMAL LOW (ref >60)
Glucose, Bld: 135 mg/dL — ABNORMAL HIGH (ref 65–99)
Potassium: 4.6 mmol/L (ref 3.5–5.1)
Sodium: 137 mmol/L (ref 135–145)

## 2015-08-30 SURGERY — GASTRECTOMY, SLEEVE, LAPAROSCOPIC
Anesthesia: General

## 2015-08-30 MED ORDER — SODIUM CHLORIDE 0.9 % IV BOLUS (SEPSIS)
1000.0000 mL | Freq: Once | INTRAVENOUS | Status: AC
  Administered 2015-08-30: 1000 mL via INTRAVENOUS

## 2015-08-30 MED ORDER — BISACODYL 10 MG RE SUPP
10.0000 mg | Freq: Once | RECTAL | Status: AC
  Administered 2015-08-30: 10 mg via RECTAL
  Filled 2015-08-30: qty 1

## 2015-08-30 MED ORDER — MAGNESIUM HYDROXIDE 400 MG/5ML PO SUSP
30.0000 mL | Freq: Once | ORAL | Status: AC
  Administered 2015-08-30: 30 mL via ORAL
  Filled 2015-08-30: qty 30

## 2015-08-30 MED ORDER — IOHEXOL 300 MG/ML  SOLN
125.0000 mL | Freq: Once | INTRAMUSCULAR | Status: AC | PRN
  Administered 2015-08-30: 125 mL via INTRAVENOUS

## 2015-08-30 MED ORDER — LACTATED RINGERS IV BOLUS (SEPSIS)
1000.0000 mL | Freq: Once | INTRAVENOUS | Status: AC
  Administered 2015-08-30: 1000 mL via INTRAVENOUS

## 2015-08-30 SURGICAL SUPPLY — 42 items
APPLIER CLIP ROT 10 11.4 M/L (STAPLE)
BANDAGE ELASTIC 6 CLIP NS LF (GAUZE/BANDAGES/DRESSINGS) IMPLANT
BLADE SURG SZ11 CARB STEEL (BLADE) IMPLANT
CANISTER SUCT 1200ML W/VALVE (MISCELLANEOUS) IMPLANT
CHLORAPREP W/TINT 26ML (MISCELLANEOUS) IMPLANT
CLIP APPLIE ROT 10 11.4 M/L (STAPLE) IMPLANT
DEFOGGER SCOPE WARMER CLEARIFY (MISCELLANEOUS) IMPLANT
DRAPE UTILITY 15X26 TOWEL STRL (DRAPES) IMPLANT
ENDOLOOP SUT PDS II  0 18 (SUTURE)
ENDOLOOP SUT PDS II 0 18 (SUTURE) IMPLANT
FILTER LAP SMOKE EVAC STRL (MISCELLANEOUS) IMPLANT
GLOVE BIO SURGEON STRL SZ7 (GLOVE) IMPLANT
GLOVE BIOGEL PI IND STRL 8.5 (GLOVE) IMPLANT
GLOVE BIOGEL PI INDICATOR 8.5 (GLOVE)
GLOVE SURG SYN 8.0 (GLOVE) IMPLANT
GOWN STRL REUS W/ TWL LRG LVL3 (GOWN DISPOSABLE) IMPLANT
GOWN STRL REUS W/TWL LRG LVL3 (GOWN DISPOSABLE)
GRASPER SUT TROCAR 14GX15 (MISCELLANEOUS) IMPLANT
IRRIGATION STRYKERFLOW (MISCELLANEOUS) IMPLANT
IRRIGATOR STRYKERFLOW (MISCELLANEOUS)
IV NS 1000ML (IV SOLUTION)
IV NS 1000ML BAXH (IV SOLUTION) IMPLANT
KIT RM TURNOVER STRD PROC AR (KITS) IMPLANT
LABEL OR SOLS (LABEL) IMPLANT
LIQUID BAND (GAUZE/BANDAGES/DRESSINGS) IMPLANT
NDL SAFETY 22GX1.5 (NEEDLE) IMPLANT
NS IRRIG 500ML POUR BTL (IV SOLUTION) IMPLANT
PACK LAP CHOLECYSTECTOMY (MISCELLANEOUS) IMPLANT
RELOAD BLUE (STAPLE) IMPLANT
RELOAD GOLD (STAPLE) IMPLANT
RELOAD GREEN (STAPLE) IMPLANT
SHEARS HARMONIC ACE PLUS 45CM (MISCELLANEOUS) IMPLANT
SLEEVE ENDOPATH XCEL 5M (ENDOMECHANICALS) IMPLANT
SLEEVE GASTRECTOMY 36FR VISIGI (MISCELLANEOUS) IMPLANT
STAPLER ECHELON LONG 60 440 (INSTRUMENTS) IMPLANT
SUT DEVICE BRAIDED 0X39 (SUTURE) IMPLANT
SUT MNCRL AB 4-0 PS2 18 (SUTURE) IMPLANT
SUT VIC AB 0 CT2 27 (SUTURE) IMPLANT
SYR 20CC LL (SYRINGE) IMPLANT
TROCAR BLADELESS 15MM (ENDOMECHANICALS) IMPLANT
TROCAR XCEL NON-BLD 5MMX100MML (ENDOMECHANICALS) IMPLANT
TUBING INSUFFLATOR HEATED (MISCELLANEOUS) IMPLANT

## 2015-08-30 NOTE — Progress Notes (Signed)
[facility] [place] at [facility]   PATIENT NAME: Shirley [name]    MR#:  [id number]  DATE OF BIRTH:  1980-12-03  SUBJECTIVE:   Pain at the incision site. Per RN received IV bolus yesterday secondary to tachycardia REVIEW OF SYSTEMS:   Review of Systems  Constitutional: Negative for fever, chills and weight loss.  HENT: Negative for ear discharge, ear pain and nosebleeds.   Eyes: Negative for blurred vision, pain and discharge.  Respiratory: Negative for sputum production, shortness of breath, wheezing and stridor.   Cardiovascular: Negative for chest pain, palpitations, orthopnea and PND.  Gastrointestinal: Positive for abdominal pain. Negative for nausea, vomiting and diarrhea.  Genitourinary: Negative for urgency and frequency.  Musculoskeletal: Negative for back pain and joint pain.  Neurological: Negative for sensory change, speech change, focal weakness and weakness.  Psychiatric/Behavioral: Negative for depression. The patient is not nervous/anxious.    Tolerating Diet: Clear liquid Tolerating PT: not needed  DRUG ALLERGIES:   Allergies  Allergen Reactions  . Caffeine Other (See Comments)    Stomach issues  . Ibuprofen Other (See Comments)    Upset stomach    VITALS:  Blood pressure 125/73, pulse 106, temperature 98.7 F (37.1 C), temperature source Oral, resp. rate 17, height  (1.575 m), weight 117.75 kg (259 lb 9.5 oz), last menstrual period 07/28/2015, SpO2 98 %.  PHYSICAL EXAMINATION:   Physical Exam  GENERAL:  34 y.o.-year-old patient lying in the bed with no acute distress. Obese EYES: Pupils equal, round, reactive to light and accommodation. No scleral icterus. Extraocular muscles intact.  HEENT: Head atraumatic, normocephalic. Oropharynx and nasopharynx clear.  NECK:  Supple, no jugular venous distention. No thyroid enlargement, no tenderness.  LUNGS: Normal breath sounds bilaterally, no wheezing, rales, rhonchi. No use of  accessory muscles of respiration.  CARDIOVASCULAR: S1, S2 normal. No murmurs, rubs, or gallops.  ABDOMEN: Soft, nontender, nondistended. Bowel sounds present. No organomegaly or mass. Incisions look ok EXTREMITIES: No cyanosis, clubbing or edema b/l.    NEUROLOGIC: Cranial nerves II through XII are intact. No focal Motor or sensory deficits b/l.   PSYCHIATRIC: The patient is alert and oriented x 3.  SKIN: No obvious rash, lesion, or ulcer.    LABORATORY PANEL:   CBC  Recent Labs Lab 08/30/15 0348  WBC 13.6*  HGB 12.2  HCT 40.2  PLT 180    Chemistries   Recent Labs Lab 08/29/15 0609 08/30/15 0348  NA 137 137  K 5.2* 4.6  CL 111 119*  CO2 12* 12*  GLUCOSE 151* 135*  BUN 10 14  CREATININE 1.13* 1.16*  CALCIUM 8.7* 8.2*  AST 305*  --   ALT 273*  --   ALKPHOS 78  --   BILITOT 1.1  --     Cardiac Enzymes No results for input(s): TROPONINI in the last 168 hours.  RADIOLOGY:  Dg Ugi W/water Sol Cm  08/29/2015   CLINICAL DATA:  Post postop bariatric gastric bypass  EXAM: WATER SOLUBLE UPPER GI SERIES  TECHNIQUE: Single-column upper GI series was performed using water soluble contrast.  CONTRAST:  Gastrografin 60 cc  COMPARISON:  Upper GI series of February 12, 2015  FLUOROSCOPY TIME:  Fluoroscopy Time (in minutes and seconds): 0 minutes, 7 seconds  Number of Acquired Images:  1 cine loop and 4 spot images  FINDINGS: The patient ingested the Gastrografin without difficulty. The distal esophagus revealed no acute abnormality. The barium passed into the stomach without difficulty. No  extraluminal contrast was observed.  IMPRESSION: There is no evidence of a leak of the ingested contrast material.   Electronically Signed   By: David  Swaziland M.D.   On: 08/29/2015 10:25     ASSESSMENT AND PLAN:   34 year old female with past medical history morbid obesity, type 2 diabetes, GERD, who presented to the hospital for an elective lap gastric bypass. Hospitalist service contacted for  medical management.  #1 status post lap gastric bypass POD#2 -continue care as per surgery. -Continue bariatric clear liquid diet. Continue DVT prophylaxis. -Patient to get CT scan of the abdomen per [name] with contrast.  #2 type 2 diabetes-non-insulin-dependent. - will hold diabetes meds at present. Sugars stable. Patient may not need all 3 medications depending on her sugars prior to discharge.  #3 hypertension-continue losartan/HCTZ.  #4 leukocytosis-likely stress mediated from her recent surgery. Follow WBC.  #5 GERD-Protonix.   Case discussed with Care Management/Social Worker. Management plans discussed with the patient, family and they are in agreement.  CODE STATUS: full  TOTAL TIME TAKING CARE OF THIS PATIENT:30 minutes.  >50% time spent on counselling and coordination of care pt and family   Indigo Chaddock M.D on 08/30/2015 at 11:40 AM  Between 7am to 6pm - Pager - [phone]  After 6pm go to [email or web address] - password [email or web address] [facility]  [facility]  Office  [phone]  CC: Primary care physician; Dale Cary, MD

## 2015-08-30 NOTE — Anesthesia Postprocedure Evaluation (Signed)
  Anesthesia Post-op Note  Patient: Shirley [name]  Procedure(s) Performed: Procedure(s): LAPAROSCOPIC GASTRIC RESTRICTIVE DUODENAL PROCEDURE (DUODENAL SWITCH) (N/A) LAPAROSCOPIC PARAESOPHAGEAL HERNIA REPAIR (N/A)  Anesthesia type:General  Patient location: PACU  Post pain: Pain level controlled  Post assessment: Post-op Vital signs reviewed, Patient's Cardiovascular Status Stable, Respiratory Function Stable, Patent Airway and No signs of Nausea or vomiting  Post vital signs: Reviewed and stable  Last Vitals:  Filed Vitals:   08/30/15 2125  BP: 136/58  Pulse: 121  Temp: 37.4 C  Resp: 28    Level of consciousness: awake, alert  and patient cooperative  Complications: No apparent anesthesia complications

## 2015-08-30 NOTE — Progress Notes (Addendum)
Subjective: Interval History: has complaints upper abdominal cramps. Some exacerbation with liquids. + voiding, no flatus.  Objective: Vital signs in last 24 hours: Temp:  [98.2 F (36.8 C)-99.4 F (37.4 C)] 99.1 F (37.3 C) (10/06 1954) Pulse Rate:  [106-129] 119 (10/06 1954) Resp:  [17-22] 22 (10/06 1954) BP: (110-130)/(46-79) 120/60 mmHg (10/06 1954) SpO2:  [98 %-100 %] 99 % (10/06 1954)  Intake/Output from previous day: 10/05 0701 - 10/06 0700 In: 2469.3 [I.V.:2469.3] Out: 1300 [Urine:1300] Intake/Output this shift:    General appearance: alert and no distress Lungs: clear to auscultation bilaterally Abdomen: slightly distended, soft, hypoactive BS, tender along areas of trocar sites.  Results for orders placed or performed during the hospital encounter of 08/28/15 (from the past 24 hour(s))  CBC     Status: Abnormal   Collection Time: 08/29/15  8:07 PM  Result Value Ref Range   WBC 15.6 (H) 3.6 - 11.0 K/uL   RBC 5.81 (H) 3.80 - 5.20 MIL/uL   Hemoglobin 12.9 12.0 - 16.0 g/dL   HCT 28.4 13.2 - 44.0 %   MCV 73.8 (L) 80.0 - 100.0 fL   MCH 22.2 (L) 26.0 - 34.0 pg   MCHC 30.0 (L) 32.0 - 36.0 g/dL   RDW 10.2 (H) 72.5 - 36.6 %   Platelets 210 150 - 440 K/uL  Glucose, capillary     Status: Abnormal   Collection Time: 08/29/15  9:15 PM  Result Value Ref Range   Glucose-Capillary 110 (H) 65 - 99 mg/dL  CBC with Differential     Status: Abnormal   Collection Time: 08/30/15  3:48 AM  Result Value Ref Range   WBC 13.6 (H) 3.6 - 11.0 K/uL   RBC 5.42 (H) 3.80 - 5.20 MIL/uL   Hemoglobin 12.2 12.0 - 16.0 g/dL   HCT 44.0 34.7 - 42.5 %   MCV 74.3 (L) 80.0 - 100.0 fL   MCH 22.5 (L) 26.0 - 34.0 pg   MCHC 30.3 (L) 32.0 - 36.0 g/dL   RDW 95.6 (H) 38.7 - 56.4 %   Platelets 180 150 - 440 K/uL   Neutrophils Relative % 77% %   Neutro Abs 10.5 (H) 1.4 - 6.5 K/uL   Lymphocytes Relative 15% %   Lymphs Abs 2.1 1.0 - 3.6 K/uL   Monocytes Relative 7% %   Monocytes Absolute 0.9  0.2 - 0.9 K/uL   Eosinophils Relative 0% %   Eosinophils Absolute 0.0 0 - 0.7 K/uL   Basophils Relative 1% %   Basophils Absolute 0.1 0 - 0.1 K/uL  Basic metabolic panel     Status: Abnormal   Collection Time: 08/30/15  3:48 AM  Result Value Ref Range   Sodium 137 135 - 145 mmol/L   Potassium 4.6 3.5 - 5.1 mmol/L   Chloride 119 (H) 101 - 111 mmol/L   CO2 12 (L) 22 - 32 mmol/L   Glucose, Bld 135 (H) 65 - 99 mg/dL   BUN 14 6 - 20 mg/dL   Creatinine, Ser 3.32 (H) 0.44 - 1.00 mg/dL   Calcium 8.2 (L) 8.9 - 10.3 mg/dL   GFR calc non Af Amer 60 (L) >60 mL/min   GFR calc Af Amer >60 >60 mL/min   Anion gap 6 5 - 15  Glucose, capillary     Status: Abnormal   Collection Time: 08/30/15  7:47 AM  Result Value Ref Range   Glucose-Capillary 144 (H) 65 - 99 mg/dL   Comment 1 Notify RN  Glucose, capillary     Status: Abnormal   Collection Time: 08/30/15 11:55 AM  Result Value Ref Range   Glucose-Capillary 104 (H) 65 - 99 mg/dL  Glucose, capillary     Status: Abnormal   Collection Time: 08/30/15  4:25 PM  Result Value Ref Range   Glucose-Capillary 106 (H) 65 - 99 mg/dL    Studies/Results: Ct Abdomen W Contrast  08/30/2015   CLINICAL DATA:  Status post sleeve gastrectomy with duodenal switch 2 days ago. Fever and upper abdominal pain with tachycardia. Rule out leak at duodenal/ileal anastomosis.  EXAM: CT ABDOMEN WITH CONTRAST  TECHNIQUE: Multidetector CT imaging of the abdomen was performed using the standard protocol following bolus administration of intravenous contrast.  CONTRAST:  OMNIPAQUE IOHEXOL 300 MG/ML  SOLN  COMPARISON:  08/29/2015 fluoroscopy study.  CT of 02/14/2005.  FINDINGS: Lower chest: Bibasilar atelectasis. Mild cardiomegaly. Mild distal esophageal dilatation with contrast within. Example on image/series 8/2.  Hepatobiliary: Mild motion degradation throughout the abdomen. Mild hepatic steatosis. Cholecystectomy, without biliary ductal dilatation.  Pancreas: Normal, without  mass or ductal dilatation.  Spleen: Small splenule.  Adrenals/Urinary Tract: Normal adrenal glands. Normal kidneys, without hydronephrosis.  Stomach/Bowel: Surgical changes about the stomach. Normal abdominal portions of the colon, terminal ileum, and appendix. The duodenum and proximal jejunum are normal in caliber. Mid small bowel loops are positioned within the anterior abdomen, contrast filled. Measure maximally 3.5 cm, including on image/series 43/2. No focal transition point identified, but the this pelvis is not imaged. No pneumatosis or free intraperitoneal air. No evidence of postoperative fluid collection or extraluminal contrast extravasation.  Vascular/Lymphatic: Normal caliber of the aorta and branch vessels. No retroperitoneal or retrocrural adenopathy.  Other: No ascites. Subcutaneous air which is likely postoperative, including in the left side of the abdomen on image/ series 55/2.  Musculoskeletal: No acute osseous abnormality.  IMPRESSION: 1. Mildly motion degraded exam. 2. Surgical changes about the stomach. Mid small bowel loops which are mildly dilated and positioned within the far anterior abdomen. This could represent a postoperative adynamic ileus. Given anterior position, internal hernia cannot be entirely excluded, but is felt less likely (given absence of mesenteric or vascular abnormality). 3. Esophageal air fluid level suggests dysmotility or gastroesophageal reflux. 4. Mild hepatic steatosis. 5. Pelvis not imaged.   Electronically Signed   By: Jeronimo Greaves M.D.   On: 08/30/2015 12:21   Dg Ugi W/water Sol Cm  08/29/2015   CLINICAL DATA:  Post postop bariatric gastric bypass  EXAM: WATER SOLUBLE UPPER GI SERIES  TECHNIQUE: Single-column upper GI series was performed using water soluble contrast.  CONTRAST:  Gastrografin 60 cc  COMPARISON:  Upper GI series of February 12, 2015  FLUOROSCOPY TIME:  Fluoroscopy Time (in minutes and seconds): 0 minutes, 7 seconds  Number of Acquired Images:  1  cine loop and 4 spot images  FINDINGS: The patient ingested the Gastrografin without difficulty. The distal esophagus revealed no acute abnormality. The barium passed into the stomach without difficulty. No extraluminal contrast was observed.  IMPRESSION: There is no evidence of a leak of the ingested contrast material.   Electronically Signed   By: David  Swaziland M.D.   On: 08/29/2015 10:25    Scheduled Meds: . budesonide (PULMICORT) nebulizer solution  0.25 mg Nebulization BID  . enoxaparin  40 mg Subcutaneous Q12H  . insulin aspart  0-20 Units Subcutaneous TID WC  . insulin aspart  0-5 Units Subcutaneous QHS  . ketotifen  1 drop Both Eyes  BID  . loratadine  10 mg Oral Daily  . losartan  100 mg Oral Daily  . pantoprazole (PROTONIX) IV  40 mg Intravenous QHS  . protein supplement  2 oz Oral TID   Continuous Infusions: . sodium chloride 100 mL/hr at 08/30/15 1023   PRN Meds:acetaminophen (TYLENOL) oral liquid 160 mg/5 mL, albuterol, diphenhydrAMINE, HYDROcodone-acetaminophen, HYDROmorphone (DILAUDID) injection, ondansetron (ZOFRAN) IV, zolpidem  Assessment/Plan:  Tachycardia persist with temp of 99. crampy abdominal pain. CT scan with dilated small bowel in central abdomen and seems to be reaching colon but majority of colon decompressed. No evidence of leak or obvious malrotation.+ edema in area of duodenal anastomosis.Reviewed with radiologist in [place]. Agree dye and air in right colon and malrotation not likely. Confirms no leak noted. Will defer laparoscopy and encourage pulmonary toilet, and try to affect bowel movement.  LOS: 2 days   [name]

## 2015-08-30 NOTE — Progress Notes (Signed)
Note ongoing tachycardia with low grade temp, ? Leak despite negative study yesterday. Will proceed with CT scan.

## 2015-08-30 NOTE — Progress Notes (Signed)
If pt goes home she can resume Invokana and Januvia for Diabetes. Hold metformin Advised to check sugars at home and review with PCP so her meds can be adjusted. Will sign off. Call if needed.

## 2015-08-31 ENCOUNTER — Inpatient Hospital Stay
Admission: RE | Admit: 2015-08-31 | Discharge: 2015-08-31 | Disposition: A | Discharge status: Home / Self Care | Payer: 59 | Source: Ambulatory Visit | Attending: Cardiology | Admitting: Cardiology

## 2015-08-31 LAB — BASIC METABOLIC PANEL
Anion gap: 13 (ref 5–15)
BUN: 10 mg/dL (ref 6–20)
CO2: 11 mmol/L — ABNORMAL LOW (ref 22–32)
Calcium: 8.5 mg/dL — ABNORMAL LOW (ref 8.9–10.3)
Chloride: 123 mmol/L — ABNORMAL HIGH (ref 101–111)
Creatinine, Ser: 0.91 mg/dL (ref 0.44–1.00)
GFR calc Af Amer: >60 mL/min (ref >60)
GFR calc non Af Amer: >60 mL/min (ref >60)
Glucose, Bld: 116 mg/dL — ABNORMAL HIGH (ref 65–99)
Potassium: 3.5 mmol/L (ref 3.5–5.1)
Sodium: 147 mmol/L — ABNORMAL HIGH (ref 135–145)

## 2015-08-31 LAB — CBC WITH DIFFERENTIAL/PLATELET
Basophils Absolute: 0 10*3/uL (ref 0–0.1)
Basophils Relative: 0 %
Eosinophils Absolute: 0 10*3/uL (ref 0–0.7)
Eosinophils Relative: 0 %
HCT: 37.6 % (ref 35.0–47.0)
Hemoglobin: 11.6 g/dL — ABNORMAL LOW (ref 12.0–16.0)
Lymphocytes Relative: 14 %
Lymphs Abs: 1.2 10*3/uL (ref 1.0–3.6)
MCH: 22.8 pg — ABNORMAL LOW (ref 26.0–34.0)
MCHC: 30.8 g/dL — ABNORMAL LOW (ref 32.0–36.0)
MCV: 74 fL — ABNORMAL LOW (ref 80.0–100.0)
Monocytes Absolute: 0.5 10*3/uL (ref 0.2–0.9)
Monocytes Relative: 5 %
Neutro Abs: 6.9 10*3/uL — ABNORMAL HIGH (ref 1.4–6.5)
Neutrophils Relative %: 81 %
Platelets: 176 10*3/uL (ref 150–440)
RBC: 5.08 MIL/uL (ref 3.80–5.20)
RDW: 15.2 % — ABNORMAL HIGH (ref 11.5–14.5)
WBC: 8.6 10*3/uL (ref 3.6–11.0)

## 2015-08-31 LAB — GLUCOSE, CAPILLARY
Glucose-Capillary: 94 mg/dL (ref 65–99)
Glucose-Capillary: 96 mg/dL (ref 65–99)
Glucose-Capillary: 103 mg/dL — ABNORMAL HIGH (ref 65–99)

## 2015-08-31 MED ORDER — METOPROLOL TARTRATE 25 MG PO TABS
25.0000 mg | ORAL_TABLET | Freq: Two times a day (BID) | ORAL | Status: DC
  Administered 2015-08-31: 25 mg via ORAL
  Filled 2015-08-31: qty 1

## 2015-08-31 NOTE — Progress Notes (Signed)
*  PRELIMINARY RESULTS* Echocardiogram 2D Echocardiogram has been performed.  Garrel Ridgel Stills 08/31/2015, 5:34 PM

## 2015-08-31 NOTE — Consult Note (Signed)
[facility] CARDIOLOGY [facility]  CARDIOLOGY CONSULT NOTE  Patient ID: Shirley [name] MRN: [id number] DOB/AGE: 29-May-1981 34 y.o.  Admit date: 08/28/2015 Referring Physician [place] Primary Physician   Primary Cardiologist Gwen Pounds Reason for Consultation Tachycardia  HPI: Patient is a 34 year old Afro-American female with history of diabetes, hypertension, hyperlipidemia and irritable bowel syndrome. She is status post gastric bypass. She has been noted to have relative sinus tachycardia on telemetry since her surgery. Telemetry today shows a heart rate between 105 and 115. It appears to be sinus tachycardia. Does not appear to be any significant ventricular or supraventricular arrhythmia. Her laboratories show a BUN and creatinine of 10 and 0.91. Her CO2 is 11. CBC shows a hemoglobin of 11.6 a white count of 8.6. Serum glucose is 99. She denies any severe pain. She does admit to some anxiety. She is able to eat and drink. She denies nausea. She has some relative shortness of breath when ambulating but no syncope or presyncope. She denies any chest pain. Pulse oximetry appears to be 95-100. She is hemodynamically stable. Systolic blood pressure appears to be 127-142.  ROS Review of Systems - History obtained from chart review and the patient General ROS: positive for  - malaise Respiratory ROS: no cough, shortness of breath, or wheezing Cardiovascular ROS: no chest pain or dyspnea on exertion Gastrointestinal ROS: positive for - Mild incisional pain Neurological ROS: no TIA or stroke symptoms   Past Medical History  Diagnosis Date  . Depression   . Diabetes mellitus (HCC)   . Migraine   . Hypertension   . IBS (irritable bowel syndrome)   . GERD (gastroesophageal reflux disease)   . Anemia   . Complication of anesthesia     itching after surgery 2005, 2006  . PONV (postoperative nausea and vomiting)     nausea  . Asthma   . Fatty liver     Family History   Problem Relation Age of Onset  . Hypertension Mother   . Diabetes Mother   . Asthma Brother   . Breast cancer Neg Hx   . Colon cancer Neg Hx     Social History   Social History  . Marital Status: Single    Spouse Name: N/A  . Number of Children: 0  . Years of Education: N/A   Occupational History  .     Social History Main Topics  . Smoking status: Never Smoker   . Smokeless tobacco: Never Used  . Alcohol Use: No  . Drug Use: No  . Sexual Activity: Not on file   Other Topics Concern  . Not on file   Social History Narrative    Past Surgical History  Procedure Laterality Date  . Cholecystectomy  2005  . 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 Laparoscopic gastric restrictive duodenal procedure (duodenal switch) N/A 08/28/2015    Procedure: LAPAROSCOPIC GASTRIC RESTRICTIVE DUODENAL PROCEDURE (DUODENAL SWITCH);  Surgeon: [name], MD;  Location: [facility] ORS;  Service: General;  Laterality: N/A;     Prescriptions prior to admission  Medication Sig Dispense Refill Last Dose  . albuterol (PROVENTIL HFA;VENTOLIN HFA) 108 (90 BASE) MCG/ACT inhaler Inhale into the lungs every 6 (six) hours as needed for wheezing or shortness of breath.   08/27/2015 at Unknown time  . azelastine (OPTIVAR) 0.05 % ophthalmic solution 1 drop 2 (two) times daily.   08/27/2015 at Unknown time  . beclomethasone (QVAR) 40 MCG/ACT inhaler Inhale 1 puff  into the lungs daily.   08/27/2015 at Unknown time  . glucose blood (BAYER CONTOUR NEXT TEST) test strip Use 1 strip two times daily. 200 each 11 08/27/2015 at Unknown time  . INVOKANA 100 MG TABS tablet Take 1 tablet by mouth in  the morning 90 tablet 0 08/27/2015 at Unknown time  . JANUVIA 100 MG tablet Take 1 tablet by mouth  daily 90 tablet 1 08/27/2015 at Unknown time  . levocetirizine (XYZAL) 5 MG tablet Take 5 mg by mouth every evening.   08/27/2015 at Unknown time  . losartan-hydrochlorothiazide (HYZAAR) 100-12.5 MG per tablet Take 1 tablet by mouth   daily 90 tablet 1 08/27/2015 at Unknown time  . Olopatadine HCl (PATANASE) 0.6 % SOLN Place into the nose as needed.   Past Week at Unknown time  . pantoprazole (PROTONIX) 40 MG tablet Take 1 tablet (40 mg total) by mouth daily. 90 tablet 1 08/28/2015 at 0600  . zolpidem (AMBIEN) 5 MG tablet Take 5 mg by mouth at bedtime as needed.   08/27/2015 at Unknown time  . [DISCONTINUED] metformin (FORTAMET) 1000 MG (OSM) 24 hr tablet Take 1 tablet (1,000 mg total) by mouth 2 (two) times daily with a meal. 180 tablet 3 Past Week at Unknown time  . [DISCONTINUED] metFORMIN (GLUCOPHAGE) 500 MG tablet Take 1 tablet (500 mg total) by mouth 2 (two) times daily with a meal. 60 tablet 1 Past Week at Unknown time  . enoxaparin (LOVENOX) 40 MG/0.4ML injection inject 0.4 milliliters by mouth subcutaneously once daily as directed  0 Not Taking at Unknown time    Physical Exam: Blood pressure 127/62, pulse 111, temperature 98.5 F (36.9 C), temperature source Oral, resp. rate 18, height  (1.575 m), weight 117.75 kg (259 lb 9.5 oz), last menstrual period 07/28/2015, SpO2 100 %.    General appearance: alert and cooperative Resp: clear to auscultation bilaterally Cardio: regular rate and rhythm GI: soft, non-tender; bowel sounds normal; no masses,  no organomegaly Extremities: extremities normal, atraumatic, no cyanosis or edema Labs:   Lab Results  Component Value Date   WBC 8.6 08/31/2015   HGB 11.6* 08/31/2015   HCT 37.6 08/31/2015   MCV 74.0* 08/31/2015   PLT 176 08/31/2015    Recent Labs Lab 08/29/15 0609  08/31/15 0655  NA 137  < > 147*  K 5.2*  < > 3.5  CL 111  < > 123*  CO2 12*  < > 11*  BUN 10  < > 10  CREATININE 1.13*  < > 0.91  CALCIUM 8.7*  < > 8.5*  PROT 7.6  --   --   BILITOT 1.1  --   --   ALKPHOS 78  --   --   ALT 273*  --   --   AST 305*  --   --   GLUCOSE 151*  < > 116*  < > = values in this interval not displayed. No results found for: CKTOTAL, CKMB, CKMBINDEX, TROPONINI     ASSESSMENT AND PLAN:  Pt with history of recent bariatric surgery with post op course complicated by sinus tachycardia on ekg. No evidence of hypovolemia ( pt had several boluses of iv fluids), does not appear to have pain, or hypoxia. Electrolytes were normal. No evidence of anemia. Rhythm does not appear to be pathologic. Will discontinue losartan and add metoprolol 25 bid. Will order echo to evaluate lv function. Further recs after above.  Signed: Dalia Heading MD, [facility] 08/31/2015, 3:24  PM

## 2015-09-01 LAB — GLUCOSE, CAPILLARY
Glucose-Capillary: 113 mg/dL — ABNORMAL HIGH (ref 65–99)
Glucose-Capillary: 148 mg/dL — ABNORMAL HIGH (ref 65–99)
Glucose-Capillary: 141 mg/dL — ABNORMAL HIGH (ref 65–99)
Glucose-Capillary: 147 mg/dL — ABNORMAL HIGH (ref 65–99)

## 2015-09-01 LAB — LACTIC ACID, PLASMA
Lactic Acid, Venous: 0.9 mmol/L (ref 0.5–2.0)
Lactic Acid, Venous: 1.1 mmol/L (ref 0.5–2.0)

## 2015-09-01 LAB — BASIC METABOLIC PANEL
Anion gap: 13 (ref 5–15)
BUN: 13 mg/dL (ref 6–20)
CO2: 10 mmol/L — ABNORMAL LOW (ref 22–32)
Calcium: 8.8 mg/dL — ABNORMAL LOW (ref 8.9–10.3)
Chloride: 129 mmol/L — ABNORMAL HIGH (ref 101–111)
Creatinine, Ser: 1.13 mg/dL — ABNORMAL HIGH (ref 0.44–1.00)
GFR calc Af Amer: >60 mL/min (ref >60)
GFR calc non Af Amer: >60 mL/min (ref >60)
Glucose, Bld: 142 mg/dL — ABNORMAL HIGH (ref 65–99)
Potassium: 3.5 mmol/L (ref 3.5–5.1)
Sodium: 152 mmol/L — ABNORMAL HIGH (ref 135–145)

## 2015-09-01 MED ORDER — SODIUM BICARBONATE 8.4 % IV SOLN
INTRAVENOUS | Status: DC
  Filled 2015-09-01 (×2): qty 150

## 2015-09-01 MED ORDER — METOPROLOL TARTRATE 50 MG PO TABS
50.0000 mg | ORAL_TABLET | Freq: Two times a day (BID) | ORAL | Status: DC
Start: 2015-09-01 — End: 2015-09-02
  Administered 2015-09-01 (×2): 50 mg via ORAL
  Filled 2015-09-01 (×2): qty 1

## 2015-09-01 MED ORDER — SODIUM BICARBONATE 8.4 % IV SOLN
INTRAVENOUS | Status: DC
  Administered 2015-09-01: 11:00:00 via INTRAVENOUS
  Filled 2015-09-01 (×2): qty 150

## 2015-09-01 MED ORDER — DEXTROSE 5 % IV SOLN
INTRAVENOUS | Status: AC
  Administered 2015-09-01: 1000 mL via INTRAVENOUS
  Administered 2015-09-01 – 2015-09-02 (×2): via INTRAVENOUS

## 2015-09-01 NOTE — Progress Notes (Signed)
Responded to rapid response. Pt O2 sat 100% on room air. Hr 107. Pt placed on 1L [place] to help with tachycardia

## 2015-09-01 NOTE — Progress Notes (Signed)
[facility]  SUBJECTIVE: No diarrhea. NO pain. Mild sob with activity. Still relatively tachycardic with sinus tachycardia   Filed Vitals:   09/01/15 0014 09/01/15 0424 09/01/15 0732 09/01/15 0843  BP: 129/65 138/84  154/99  Pulse: 96 107  106  Temp: 99.3 F (37.4 C) 98.1 F (36.7 C)  97.7 F (36.5 C)  TempSrc: Oral Oral  Oral  Resp: Height:      Weight:      SpO2: 100% 100% 100% 94%    Intake/Output Summary (Last 24 hours) at 09/01/15 0921 Last data filed at 09/01/15 0500  Gross per 24 hour  Intake    236 ml  Output   3600 ml  Net  -3364 ml    LABS: Basic Metabolic Panel:  Recent Labs  78/29/56 0348 08/31/15 0655  NA 137 147*  K 4.6 3.5  CL 119* 123*  CO2 12* 11*  GLUCOSE 135* 116*  BUN 14 10  CREATININE 1.16* 0.91  CALCIUM 8.2* 8.5*   Liver Function Tests: No results for input(s): AST, ALT, ALKPHOS, BILITOT, PROT, ALBUMIN in the last 72 hours. No results for input(s): LIPASE, AMYLASE in the last 72 hours. CBC:  Recent Labs  08/30/15 0348 08/31/15 0655  WBC 13.6* 8.6  NEUTROABS 10.5* 6.9*  HGB 12.2 11.6*  HCT 40.2 37.6  MCV 74.3* 74.0*  PLT 180 176   Cardiac Enzymes: No results for input(s): CKTOTAL, CKMB, CKMBINDEX, TROPONINI in the last 72 hours. BNP: Invalid input(s): POCBNP D-Dimer: No results for input(s): DDIMER in the last 72 hours. Hemoglobin A1C: No results for input(s): HGBA1C in the last 72 hours. Fasting Lipid Panel: No results for input(s): CHOL, HDL, LDLCALC, TRIG, CHOLHDL, LDLDIRECT in the last 72 hours. Thyroid Function Tests: No results for input(s): TSH, T4TOTAL, T3FREE, THYROIDAB in the last 72 hours.  Invalid input(s): FREET3 Anemia Panel: No results for input(s): VITAMINB12, FOLATE, FERRITIN, TIBC, IRON, RETICCTPCT in the last 72 hours.   Physical Exam: Blood pressure 154/99, pulse 106, temperature 97.7 F (36.5 C), temperature source Oral, resp. rate 17, height   (1.575 m), weight 117.75 kg (259 lb 9.5 oz), last menstrual period 07/28/2015, SpO2 94 %.   General appearance: alert and cooperative Cardio: regular rate and rhythm GI: soft, non-tender; bowel sounds normal; no masses,  no organomegaly Extremities: extremities normal, atraumatic, no cyanosis or edema Neurologic: Grossly normal  TELEMETRY: Reviewed telemetry pt in sinus tachcyardia:  ASSESSMENT AND PLAN:  Active Problems:   Bariatric surgery status-s/p surgery. Electrolytes suggest hyperchloremic metabolic acidosis with chloride of 123 and co of 11. Echo showed normal lv function with no significant valve abnormalities. Tachycardia may be driven by the acidosis. Will attempt to treat with iv sodium bicarb. Will recheck bmp today and in am.      Dalia Heading., MD, [facility] 09/01/2015 9:21 AM

## 2015-09-01 NOTE — Progress Notes (Signed)
   09/01/15 1030  Clinical Encounter Type  Visited With Patient and family together  Visit Type Code  Referral From Nurse  Consult/Referral To Chaplain  Spiritual Encounters  Spiritual Needs Prayer;Emotional  Stress Factors  Patient Stress Factors Exhausted;Health changes  Family Stress Factors Family relationships;Health changes  Paged with medical alert to Patient. Provided pastoral care and prayer to ptient & family. Chap. Jacquelynne Guedes G. Liam Bossman, ext.[phone]

## 2015-09-01 NOTE — Progress Notes (Signed)
Subjective: Interval History: has complaints headache.tolerating liquids. +flatus and stools. No abdominal pain or chest pain. + menstrual flow.  Objective: Vital signs in last 24 hours: Temp:  [97.7 F (36.5 C)-99.3 F (37.4 C)] 97.7 F (36.5 C) (10/08 0843) Pulse Rate:  [96-119] 106 (10/08 0843) Resp:  [16-28] 17 (10/08 0843) BP: (126-157)/(62-99) 154/99 mmHg (10/08 0843) SpO2:  [94 %-100 %] 94 % (10/08 0843)  Intake/Output from previous day: 10/07 0701 - 10/08 0700 In: 236 [I.V.:236] Out: 3600 [Urine:3600] Intake/Output this shift:    General appearance: alert, cooperative and no distress Lungs: clear to auscultation bilaterally Abdomen: soft, non-tender; bowel sounds normal; no masses,  no organomegaly  Results for orders placed or performed during the hospital encounter of 08/28/15 (from the past 24 hour(s))  Glucose, capillary     Status: None   Collection Time: 08/31/15  4:44 PM  Result Value Ref Range   Glucose-Capillary 96 65 - 99 mg/dL   Comment 1 Notify RN   Glucose, capillary     Status: Abnormal   Collection Time: 08/31/15  9:29 PM  Result Value Ref Range   Glucose-Capillary 103 (H) 65 - 99 mg/dL  Glucose, capillary     Status: Abnormal   Collection Time: 09/01/15  7:39 AM  Result Value Ref Range   Glucose-Capillary 113 (H) 65 - 99 mg/dL  Lactic acid, plasma     Status: None   Collection Time: 09/01/15  9:21 AM  Result Value Ref Range   Lactic Acid, Venous 0.9 0.5 - 2.0 mmol/L  Basic metabolic panel     Status: Abnormal   Collection Time: 09/01/15  9:21 AM  Result Value Ref Range   Sodium 152 (H) 135 - 145 mmol/L   Potassium 3.5 3.5 - 5.1 mmol/L   Chloride 129 (H) 101 - 111 mmol/L   CO2 10 (L) 22 - 32 mmol/L   Glucose, Bld 142 (H) 65 - 99 mg/dL   BUN 13 6 - 20 mg/dL   Creatinine, Ser 2.95 (H) 0.44 - 1.00 mg/dL   Calcium 8.8 (L) 8.9 - 10.3 mg/dL   GFR calc non Af Amer >60 >60 mL/min   GFR calc Af Amer >60 >60 mL/min   Anion gap 13 5 - 15  Glucose,  capillary     Status: Abnormal   Collection Time: 09/01/15 11:28 AM  Result Value Ref Range   Glucose-Capillary 148 (H) 65 - 99 mg/dL    Studies/Results: Ct Abdomen W Contrast  08/30/2015   CLINICAL DATA:  Status post sleeve gastrectomy with duodenal switch 2 days ago. Fever and upper abdominal pain with tachycardia. Rule out leak at duodenal/ileal anastomosis.  EXAM: CT ABDOMEN WITH CONTRAST  TECHNIQUE: Multidetector CT imaging of the abdomen was performed using the standard protocol following bolus administration of intravenous contrast.  CONTRAST:  OMNIPAQUE IOHEXOL 300 MG/ML  SOLN  COMPARISON:  08/29/2015 fluoroscopy study.  CT of 02/14/2005.  FINDINGS: Lower chest: Bibasilar atelectasis. Mild cardiomegaly. Mild distal esophageal dilatation with contrast within. Example on image/series 8/2.  Hepatobiliary: Mild motion degradation throughout the abdomen. Mild hepatic steatosis. Cholecystectomy, without biliary ductal dilatation.  Pancreas: Normal, without mass or ductal dilatation.  Spleen: Small splenule.  Adrenals/Urinary Tract: Normal adrenal glands. Normal kidneys, without hydronephrosis.  Stomach/Bowel: Surgical changes about the stomach. Normal abdominal portions of the colon, terminal ileum, and appendix. The duodenum and proximal jejunum are normal in caliber. Mid small bowel loops are positioned within the anterior abdomen, contrast filled. Measure maximally 3.5  cm, including on image/series 43/2. No focal transition point identified, but the this pelvis is not imaged. No pneumatosis or free intraperitoneal air. No evidence of postoperative fluid collection or extraluminal contrast extravasation.  Vascular/Lymphatic: Normal caliber of the aorta and branch vessels. No retroperitoneal or retrocrural adenopathy.  Other: No ascites. Subcutaneous air which is likely postoperative, including in the left side of the abdomen on image/ series 55/2.  Musculoskeletal: No acute osseous abnormality.   IMPRESSION: 1. Mildly motion degraded exam. 2. Surgical changes about the stomach. Mid small bowel loops which are mildly dilated and positioned within the far anterior abdomen. This could represent a postoperative adynamic ileus. Given anterior position, internal hernia cannot be entirely excluded, but is felt less likely (given absence of mesenteric or vascular abnormality). 3. Esophageal air fluid level suggests dysmotility or gastroesophageal reflux. 4. Mild hepatic steatosis. 5. Pelvis not imaged.   Electronically Signed   By: Jeronimo Greaves M.D.   On: 08/30/2015 12:21   Dg Ugi W/water Sol Cm  08/29/2015   CLINICAL DATA:  Post postop bariatric gastric bypass  EXAM: WATER SOLUBLE UPPER GI SERIES  TECHNIQUE: Single-column upper GI series was performed using water soluble contrast.  CONTRAST:  Gastrografin 60 cc  COMPARISON:  Upper GI series of February 12, 2015  FLUOROSCOPY TIME:  Fluoroscopy Time (in minutes and seconds): 0 minutes, 7 seconds  Number of Acquired Images:  1 cine loop and 4 spot images  FINDINGS: The patient ingested the Gastrografin without difficulty. The distal esophagus revealed no acute abnormality. The barium passed into the stomach without difficulty. No extraluminal contrast was observed.  IMPRESSION: There is no evidence of a leak of the ingested contrast material.   Electronically Signed   By: David  [place] M.D.   On: 08/29/2015 10:25    Scheduled Meds: . budesonide (PULMICORT) nebulizer solution  0.25 mg Nebulization BID  . enoxaparin  40 mg Subcutaneous Q12H  . insulin aspart  0-20 Units Subcutaneous TID WC  . insulin aspart  0-5 Units Subcutaneous QHS  . ketotifen  1 drop Both Eyes BID  . loratadine  10 mg Oral Daily  . metoprolol tartrate  50 mg Oral BID  . pantoprazole (PROTONIX) IV  40 mg Intravenous QHS  . protein supplement  2 oz Oral TID   Continuous Infusions: .  sodium bicarbonate  infusion 1000 mL 75 mL/hr at 09/01/15 1038   PRN Meds:acetaminophen (TYLENOL)  oral liquid 160 mg/5 mL, albuterol, diphenhydrAMINE, HYDROcodone-acetaminophen, HYDROmorphone (DILAUDID) injection, ondansetron (ZOFRAN) IV, zolpidem  Assessment/Plan: + normalization of WBC, nl lactic acid. Progressive hypernatremia with bicarb infusion. Tachycardia resolved with metoprolol. Operative recovery seems on tract post ileus. ? Cause of tachycardia and hypernatremia. Will discuss with hospitalist. Hope for home in am.    LOS: 4 days   [name]

## 2015-09-01 NOTE — H&P (Deleted)
[facility] HOSPITALIST  Medical Consultation  Shirley [name] ZOX:[id number] DOB: 04/28/1981 DOA: 08/28/2015 PCP: Dale Du Quoin, MD   Requesting physician: [name] M.D. Date of consultation: 09/01/15 Reason for consultation: Hypernatremia CHIEF COMPLAINT:  No chief complaint on file.   HISTORY OF PRESENT ILLNESS: Shirley [name]  is a 34 y.o. female with a known history of morbid obesity, type 2 diabetes non-insulin-dependent, hypertension, irritable bowel syndrome, GERD who was admitted for elective lap gastric bypass. Patient patient is noted to have a sodium of 152. She states that she feels okay but is not having any diarrhea is not having any emesis. Denies any chest pain or shortness of breath.   PAST MEDICAL HISTORY:   Past Medical History  Diagnosis Date  . Depression   . Diabetes mellitus (HCC)   . Migraine   . Hypertension   . IBS (irritable bowel syndrome)   . GERD (gastroesophageal reflux disease)   . Anemia   . Complication of anesthesia     itching after surgery 2005, 2006  . PONV (postoperative nausea and vomiting)     nausea  . Asthma   . Fatty liver     PAST SURGICAL HISTORY:  Past Surgical History  Procedure Laterality Date  . Cholecystectomy  2005  . 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 Laparoscopic gastric restrictive duodenal procedure (duodenal switch) N/A 08/28/2015    Procedure: LAPAROSCOPIC GASTRIC RESTRICTIVE DUODENAL PROCEDURE (DUODENAL SWITCH);  Surgeon: [name], MD;  Location: [facility] ORS;  Service: General;  Laterality: N/A;    SOCIAL HISTORY:  Social History  Substance Use Topics  . Smoking status: Never Smoker   . Smokeless tobacco: Never Used  . Alcohol Use: No    FAMILY HISTORY:  Family History  Problem Relation Age of Onset  . Hypertension Mother   . Diabetes Mother   . Asthma Brother   . Breast cancer Neg Hx   . Colon cancer Neg Hx     DRUG ALLERGIES:  Allergies  Allergen Reactions  . Caffeine Other (See  Comments)    Stomach issues  . Ibuprofen Other (See Comments)    Upset stomach    REVIEW OF SYSTEMS:   CONSTITUTIONAL: No fever, fatigue or weakness.  EYES: No blurred or double vision.  EARS, NOSE, AND THROAT: No tinnitus or ear pain.  RESPIRATORY: No cough, shortness of breath, wheezing or hemoptysis.  CARDIOVASCULAR: No chest pain, orthopnea, edema.  GASTROINTESTINAL: No nausea, vomiting, diarrhea or abdominal pain.  GENITOURINARY: No dysuria, hematuria.  ENDOCRINE: No polyuria, nocturia,  HEMATOLOGY: No anemia, easy bruising or bleeding SKIN: No rash or lesion. MUSCULOSKELETAL: No joint pain or arthritis.   NEUROLOGIC: No tingling, numbness, weakness.  PSYCHIATRY: No anxiety or depression.   MEDICATIONS AT HOME:  Prior to Admission medications   Medication Sig Start Date End Date Taking? Authorizing Provider  albuterol (PROVENTIL HFA;VENTOLIN HFA) 108 (90 BASE) MCG/ACT inhaler Inhale into the lungs every 6 (six) hours as needed for wheezing or shortness of breath.   Yes Historical Provider, MD  azelastine (OPTIVAR) 0.05 % ophthalmic solution 1 drop 2 (two) times daily.   Yes Historical Provider, MD  beclomethasone (QVAR) 40 MCG/ACT inhaler Inhale 1 puff into the lungs daily.   Yes Historical Provider, MD  glucose blood (BAYER CONTOUR NEXT TEST) test strip Use 1 strip two times daily. 06/08/15  Yes Dale Haleiwa, MD  INVOKANA 100 MG TABS tablet Take 1 tablet by mouth in  the morning 06/08/15  Yes Carlus Pavlov, MD  JANUVIA 100 MG tablet Take 1 tablet by mouth  daily 05/21/15  Yes Carlus Pavlov, MD  levocetirizine (XYZAL) 5 MG tablet Take 5 mg by mouth every evening.   Yes Historical Provider, MD  losartan-hydrochlorothiazide (HYZAAR) 100-12.5 MG per tablet Take 1 tablet by mouth  daily 07/02/15  Yes Dale Maili, MD  Olopatadine HCl (PATANASE) 0.6 % SOLN Place into the nose as needed.   Yes Historical Provider, MD  pantoprazole (PROTONIX) 40 MG tablet Take 1 tablet (40 mg  total) by mouth daily. 07/10/15  Yes Dale Cannon, MD  zolpidem (AMBIEN) 5 MG tablet Take 5 mg by mouth at bedtime as needed.   Yes Historical Provider, MD  enoxaparin (LOVENOX) 40 MG/0.4ML injection inject 0.4 milliliters by mouth subcutaneously once daily as directed 08/01/15   Historical Provider, MD      PHYSICAL EXAMINATION:   VITAL SIGNS: Blood pressure 154/99, pulse 106, temperature 97.7 F (36.5 C), temperature source Oral, resp. rate 17, height  (1.575 m), weight 117.75 kg (259 lb 9.5 oz), last menstrual period 07/28/2015, SpO2 94 %.  GENERAL:  34 y.o.-year-old patient lying in the bed with no acute distress.  EYES: Pupils equal, round, reactive to light and accommodation. No scleral icterus. Extraocular muscles intact.  HEENT: Head atraumatic, normocephalic. Oropharynx and nasopharynx clear.  NECK:  Supple, no jugular venous distention. No thyroid enlargement, no tenderness.  LUNGS: Normal breath sounds bilaterally, no wheezing, rales,rhonchi or crepitation. No use of accessory muscles of respiration.  CARDIOVASCULAR: S1, S2 normal. No murmurs, rubs, or gallops.  ABDOMEN: Soft, nontender, nondistended. Bowel sounds present. No organomegaly or mass.  EXTREMITIES: No pedal edema, cyanosis, or clubbing.  NEUROLOGIC: Cranial nerves II through XII are intact. Muscle strength 5/5 in all extremities. Sensation intact. Gait not checked.  PSYCHIATRIC: The patient is alert and oriented x 3.  SKIN: No obvious rash, lesion, or ulcer.   LABORATORY PANEL:   CBC  Recent Labs Lab 08/28/15 1649 08/29/15 0609 08/29/15 2007 08/30/15 0348 08/31/15 0655  WBC 18.1* 17.2* 15.6* 13.6* 8.6  HGB 12.8 12.5 12.9 12.2 11.6*  HCT 42.2 41.5 42.9 40.2 37.6  PLT 198 208 210 180 176  MCV 73.3* 74.6* 73.8* 74.3* 74.0*  MCH 22.3* 22.4* 22.2* 22.5* 22.8*  MCHC 30.3* 30.1* 30.0* 30.3* 30.8*  RDW 14.7* 15.1* 15.0* 14.8* 15.2*  LYMPHSABS  --  1.9  --  2.1 1.2  MONOABS  --  0.5  --  0.9 0.5  EOSABS   --  0.0  --  0.0 0.0  BASOSABS  --  0.1  --  0.1 0.0   ------------------------------------------------------------------------------------------------------------------  Chemistries   Recent Labs Lab 08/28/15 1649 08/29/15 0609 08/30/15 0348 08/31/15 0655 09/01/15 0921  NA  --  137 137 147* 152*  K  --  5.2* 4.6 3.5 3.5  CL  --  111 119* 123* 129*  CO2  --  12* 12* 11* 10*  GLUCOSE  --  151* 135* 116* 142*  BUN  --  CREATININE 0.92 1.13* 1.16* 0.91 1.13*  CALCIUM  --  8.7* 8.2* 8.5* 8.8*  AST  --  305*  --   --   --   ALT  --  273*  --   --   --   ALKPHOS  --  78  --   --   --   BILITOT  --  1.1  --   --   --    ------------------------------------------------------------------------------------------------------------------  estimated creatinine clearance is 83.9 mL/min (by C-G formula based on Cr of 1.13). ------------------------------------------------------------------------------------------------------------------ No results for input(s): TSH, T4TOTAL, T3FREE, THYROIDAB in the last 72 hours.  Invalid input(s): FREET3   Coagulation profile No results for input(s): INR, PROTIME in the last 168 hours. ------------------------------------------------------------------------------------------------------------------- No results for input(s): DDIMER in the last 72 hours. -------------------------------------------------------------------------------------------------------------------  Cardiac Enzymes No results for input(s): CKMB, TROPONINI, MYOGLOBIN in the last 168 hours.  Invalid input(s): CK ------------------------------------------------------------------------------------------------------------------ Invalid input(s): POCBNP  ---------------------------------------------------------------------------------------------------------------  Urinalysis    Component Value Date/Time   COLORURINE Straw 08/26/2012 1549   APPEARANCEUR Hazy 08/26/2012  1549   LABSPEC 1.013 08/26/2012 1549   PHURINE 6.0 08/26/2012 1549   GLUCOSEU Negative 08/26/2012 1549   HGBUR Negative 08/26/2012 1549   BILIRUBINUR Negative 08/26/2012 1549   KETONESUR Negative 08/26/2012 1549   PROTEINUR Negative 08/26/2012 1549   NITRITE Negative 08/26/2012 1549   LEUKOCYTESUR Negative 08/26/2012 1549     RADIOLOGY: No results found.  EKG: Orders placed or performed in visit on 09/08/13  . EKG 12-Lead    IMPRESSION AND PLAN:  Patient is a 34 year old female status post elective lap gastric bypass  #1. Hypernatremia at this time I feel the patient likely has free water deficit will start her on D5W follow her sodium levels.  #2 diabetes type 2 non-insulin-dependent blood sugars are currently stable expect sugars to be elevated with the D5, continue sliding scale insulin for now, resume metformin on discharge  #3 hypertension currently on metoprolol due to elevated heart rate continue that for now  #4 GERD add Pepcid to current treatment   All the records are reviewed and case discussed with ED provider. Management plans discussed with the patient, family and they are in agreement.  CODE STATUS:    Code Status Orders        Start     Ordered   08/28/15 1543  Full code   Continuous     08/28/15 1543       TOTAL TIME TAKING CARE OF THIS PATIENT: 55 minutes.    Auburn Bilberry M.D on 09/01/2015 at 12:34 PM  Between 7am to 6pm - Pager - [phone]  After 6pm go to [email or web address] - password [email or web address] [facility]  [place] [facility]  Office  [phone]  CC: Primary care physician; Dale Lee, MD

## 2015-09-02 LAB — CBC WITH DIFFERENTIAL/PLATELET
Basophils Absolute: 0 10*3/uL (ref 0–0.1)
Basophils Relative: 0 %
Eosinophils Absolute: 0 10*3/uL (ref 0–0.7)
Eosinophils Relative: 0 %
HCT: 38.6 % (ref 35.0–47.0)
Hemoglobin: 12.1 g/dL (ref 12.0–16.0)
Lymphocytes Relative: 27 %
Lymphs Abs: 1.8 10*3/uL (ref 1.0–3.6)
MCH: 22.3 pg — ABNORMAL LOW (ref 26.0–34.0)
MCHC: 31.2 g/dL — ABNORMAL LOW (ref 32.0–36.0)
MCV: 71.4 fL — ABNORMAL LOW (ref 80.0–100.0)
Monocytes Absolute: 0.7 10*3/uL (ref 0.2–0.9)
Monocytes Relative: 10 %
Neutro Abs: 4.3 10*3/uL (ref 1.4–6.5)
Neutrophils Relative %: 63 %
Platelets: 202 10*3/uL (ref 150–440)
RBC: 5.41 MIL/uL — ABNORMAL HIGH (ref 3.80–5.20)
RDW: 15.2 % — ABNORMAL HIGH (ref 11.5–14.5)
WBC: 6.9 10*3/uL (ref 3.6–11.0)

## 2015-09-02 LAB — GLUCOSE, CAPILLARY
Glucose-Capillary: 168 mg/dL — ABNORMAL HIGH (ref 65–99)
Glucose-Capillary: 157 mg/dL — ABNORMAL HIGH (ref 65–99)
Glucose-Capillary: 171 mg/dL — ABNORMAL HIGH (ref 65–99)
Glucose-Capillary: 181 mg/dL — ABNORMAL HIGH (ref 65–99)

## 2015-09-02 LAB — BASIC METABOLIC PANEL
Anion gap: 9 (ref 5–15)
BUN: 14 mg/dL (ref 6–20)
CO2: 19 mmol/L — ABNORMAL LOW (ref 22–32)
Calcium: 8.9 mg/dL (ref 8.9–10.3)
Chloride: 122 mmol/L — ABNORMAL HIGH (ref 101–111)
Creatinine, Ser: 1.12 mg/dL — ABNORMAL HIGH (ref 0.44–1.00)
GFR calc Af Amer: >60 mL/min (ref >60)
GFR calc non Af Amer: >60 mL/min (ref >60)
Glucose, Bld: 203 mg/dL — ABNORMAL HIGH (ref 65–99)
Potassium: 2.8 mmol/L — CL (ref 3.5–5.1)
Sodium: 150 mmol/L — ABNORMAL HIGH (ref 135–145)

## 2015-09-02 LAB — MAGNESIUM: Magnesium: 2.6 mg/dL — ABNORMAL HIGH (ref 1.7–2.4)

## 2015-09-02 MED ORDER — DEXTROSE 5 % IV SOLN
INTRAVENOUS | Status: AC
  Administered 2015-09-02: 1000 mL via INTRAVENOUS
  Administered 2015-09-03: 02:00:00 via INTRAVENOUS

## 2015-09-02 MED ORDER — POTASSIUM CHLORIDE 20 MEQ PO PACK
20.0000 meq | PACK | Freq: Three times a day (TID) | ORAL | Status: DC
  Administered 2015-09-02 – 2015-09-03 (×4): 20 meq via ORAL
  Filled 2015-09-02 (×4): qty 1

## 2015-09-02 MED ORDER — METOPROLOL TARTRATE 25 MG PO TABS
25.0000 mg | ORAL_TABLET | Freq: Two times a day (BID) | ORAL | Status: DC
  Administered 2015-09-02: 25 mg via ORAL
  Filled 2015-09-02 (×3): qty 1

## 2015-09-02 NOTE — Progress Notes (Addendum)
[facility]  SUBJECTIVE: heart rate better at rest but still somewhat variable with activity. Serum sodium increased but improved this am. Serum chloride improved and metablolic acidosis improved. Hypokaliemic Denies nausea or diarrhea . Denies chest pain  Filed Vitals:   09/02/15 0140 09/02/15 0608 09/02/15 0738 09/02/15 0840  BP: 115/71 118/67    Pulse: 85 83  88  Temp: 98.4 F (36.9 C) 98.3 F (36.8 C)    TempSrc: Oral Oral    Resp: 16 24    Height:      Weight:      SpO2: 100% 100% 100%     Intake/Output Summary (Last 24 hours) at 09/02/15 0906 Last data filed at 09/02/15 0400  Gross per 24 hour  Intake 1609.67 ml  Output   1150 ml  Net 459.67 ml    LABS: Basic Metabolic Panel:  Recent Labs  16/10/96 0921 09/02/15 0621  NA 152* 150*  K 3.5 2.8*  CL 129* 122*  CO2 10* 19*  GLUCOSE 142* 203*  BUN 13 14  CREATININE 1.13* 1.12*  CALCIUM 8.8* 8.9   Liver Function Tests: No results for input(s): AST, ALT, ALKPHOS, BILITOT, PROT, ALBUMIN in the last 72 hours. No results for input(s): LIPASE, AMYLASE in the last 72 hours. CBC:  Recent Labs  08/31/15 0655 09/02/15 0621  WBC 8.6 6.9  NEUTROABS 6.9* 4.3  HGB 11.6* 12.1  HCT 37.6 38.6  MCV 74.0* 71.4*  PLT 176 202   Cardiac Enzymes: No results for input(s): CKTOTAL, CKMB, CKMBINDEX, TROPONINI in the last 72 hours. BNP: Invalid input(s): POCBNP D-Dimer: No results for input(s): DDIMER in the last 72 hours. Hemoglobin A1C: No results for input(s): HGBA1C in the last 72 hours. Fasting Lipid Panel: No results for input(s): CHOL, HDL, LDLCALC, TRIG, CHOLHDL, LDLDIRECT in the last 72 hours. Thyroid Function Tests: No results for input(s): TSH, T4TOTAL, T3FREE, THYROIDAB in the last 72 hours.  Invalid input(s): FREET3 Anemia Panel: No results for input(s): VITAMINB12, FOLATE, FERRITIN, TIBC, IRON, RETICCTPCT in the last 72 hours.   Physical Exam: Blood pressure 118/67,  pulse 88, temperature 98.3 F (36.8 C), temperature source Oral, resp. rate 24, height  (1.575 m), weight 117.75 kg (259 lb 9.5 oz), last menstrual period 07/28/2015, SpO2 100 %.    General appearance: alert and cooperative Resp: clear to auscultation bilaterally Cardio: regular rate and rhythm Extremities: edema 1-2+ Neurologic: Grossly normal  TELEMETRY: Reviewed telemetry: pt in nsr with intermittent sinus tach  ASSESSMENT AND PLAN:  Active Problems:   Bariatric surgery status  Tachycardia improved somewhat at rest but still tachycardic with activity. Metabolic acidosis improved somewhat but still hypokalemic and hypernatremic. Increase free water.BIcarb was discontinued and metoprolol reduced due to hypotension.  Dalia Heading., MD, [facility] 09/02/2015 9:06 AM

## 2015-09-02 NOTE — Consult Note (Addendum)
[facility] HOSPITALIST  Medical Consultation  Shirley [name] RUE:[id number] DOB: 19-Jan-1981 DOA: 08/28/2015 PCP: Dale Newhall, MD   Requesting physician: [name] M.D. Date of consultation: 09/01/15 Reason for consultation: Hypernatremia CHIEF COMPLAINT:  No chief complaint on file.   HISTORY OF PRESENT ILLNESS: Shirley [name]  is a 34 y.o. female with a known history of morbid obesity, type 2 diabetes non-insulin-dependent, hypertension, irritable bowel syndrome, GERD who was admitted for elective lap gastric bypass. Patient patient is noted to have a sodium of 152. She states that she feels okay but is not having any diarrhea is not having any emesis. Denies any chest pain or shortness of breath.   PAST MEDICAL HISTORY:   Past Medical History  Diagnosis Date  . Depression   . Diabetes mellitus (HCC)   . Migraine   . Hypertension   . IBS (irritable bowel syndrome)   . GERD (gastroesophageal reflux disease)   . Anemia   . Complication of anesthesia     itching after surgery 2005, 2006  . PONV (postoperative nausea and vomiting)     nausea  . Asthma   . Fatty liver     PAST SURGICAL HISTORY:  Past Surgical History  Procedure Laterality Date  . Cholecystectomy  2005  . 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 Laparoscopic gastric restrictive duodenal procedure (duodenal switch) N/A 08/28/2015    Procedure: LAPAROSCOPIC GASTRIC RESTRICTIVE DUODENAL PROCEDURE (DUODENAL SWITCH);  Surgeon: [name], MD;  Location: [facility] ORS;  Service: General;  Laterality: N/A;    SOCIAL HISTORY:  Social History  Substance Use Topics  . Smoking status: Never Smoker   . Smokeless tobacco: Never Used  . Alcohol Use: No    FAMILY HISTORY:  Family History  Problem Relation Age of Onset  . Hypertension Mother   . Diabetes Mother   . Asthma Brother   . Breast cancer Neg Hx   . Colon cancer Neg Hx     DRUG ALLERGIES:  Allergies  Allergen Reactions  . Caffeine Other (See  Comments)    Stomach issues  . Ibuprofen Other (See Comments)    Upset stomach    REVIEW OF SYSTEMS:   CONSTITUTIONAL: No fever, fatigue or weakness.  EYES: No blurred or double vision.  EARS, NOSE, AND THROAT: No tinnitus or ear pain.  RESPIRATORY: No cough, shortness of breath, wheezing or hemoptysis.  CARDIOVASCULAR: No chest pain, orthopnea, edema.  GASTROINTESTINAL: No nausea, vomiting, diarrhea or abdominal pain.  GENITOURINARY: No dysuria, hematuria.  ENDOCRINE: No polyuria, nocturia,  HEMATOLOGY: No anemia, easy bruising or bleeding SKIN: No rash or lesion. MUSCULOSKELETAL: No joint pain or arthritis.   NEUROLOGIC: No tingling, numbness, weakness.  PSYCHIATRY: No anxiety or depression.   MEDICATIONS AT HOME:  Prior to Admission medications   Medication Sig Start Date End Date Taking? Authorizing Provider  albuterol (PROVENTIL HFA;VENTOLIN HFA) 108 (90 BASE) MCG/ACT inhaler Inhale into the lungs every 6 (six) hours as needed for wheezing or shortness of breath.   Yes Historical Provider, MD  azelastine (OPTIVAR) 0.05 % ophthalmic solution 1 drop 2 (two) times daily.   Yes Historical Provider, MD  beclomethasone (QVAR) 40 MCG/ACT inhaler Inhale 1 puff into the lungs daily.   Yes Historical Provider, MD  glucose blood (BAYER CONTOUR NEXT TEST) test strip Use 1 strip two times daily. 06/08/15  Yes Dale Rogers, MD  INVOKANA 100 MG TABS tablet Take 1 tablet by mouth in  the morning 06/08/15  Yes Carlus Pavlov, MD  JANUVIA 100 MG tablet Take 1 tablet by mouth  daily 05/21/15  Yes Carlus Pavlov, MD  levocetirizine (XYZAL) 5 MG tablet Take 5 mg by mouth every evening.   Yes Historical Provider, MD  losartan-hydrochlorothiazide (HYZAAR) 100-12.5 MG per tablet Take 1 tablet by mouth  daily 07/02/15  Yes Dale Marion, MD  Olopatadine HCl (PATANASE) 0.6 % SOLN Place into the nose as needed.   Yes Historical Provider, MD  pantoprazole (PROTONIX) 40 MG tablet Take 1 tablet (40 mg  total) by mouth daily. 07/10/15  Yes Dale [place], MD  zolpidem (AMBIEN) 5 MG tablet Take 5 mg by mouth at bedtime as needed.   Yes Historical Provider, MD  enoxaparin (LOVENOX) 40 MG/0.4ML injection inject 0.4 milliliters by mouth subcutaneously once daily as directed 08/01/15   Historical Provider, MD      PHYSICAL EXAMINATION:   VITAL SIGNS: Blood pressure 118/66, pulse 83, temperature 98.6 F (37 C), temperature source Oral, resp. rate 20, height 5\' 2"  (1.575 m), weight 117.75 kg (259 lb 9.5 oz), last menstrual period 07/28/2015, SpO2 100 %.  GENERAL:  34 y.o.-year-old patient lying in the bed with no acute distress.  EYES: Pupils equal, round, reactive to light and accommodation. No scleral icterus. Extraocular muscles intact.  HEENT: Head atraumatic, normocephalic. Oropharynx and nasopharynx clear.  NECK:  Supple, no jugular venous distention. No thyroid enlargement, no tenderness.  LUNGS: Normal breath sounds bilaterally, no wheezing, rales,rhonchi or crepitation. No use of accessory muscles of respiration.  CARDIOVASCULAR: S1, S2 normal. No murmurs, rubs, or gallops.  ABDOMEN: Soft, nontender, nondistended. Bowel sounds present. No organomegaly or mass.  EXTREMITIES: No pedal edema, cyanosis, or clubbing.  NEUROLOGIC: Cranial nerves II through XII are intact. Muscle strength 5/5 in all extremities. Sensation intact. Gait not checked.  PSYCHIATRIC: The patient is alert and oriented x 3.  SKIN: No obvious rash, lesion, or ulcer.   LABORATORY PANEL:   CBC  Recent Labs Lab 08/29/15 0609 08/29/15 2007 08/30/15 0348 08/31/15 0655 09/02/15 0621  WBC 17.2* 15.6* 13.6* 8.6 6.9  HGB 12.5 12.9 12.2 11.6* 12.1  HCT 41.5 42.9 40.2 37.6 38.6  PLT 208 210 180 176 202  MCV 74.6* 73.8* 74.3* 74.0* 71.4*  MCH 22.4* 22.2* 22.5* 22.8* 22.3*  MCHC 30.1* 30.0* 30.3* 30.8* 31.2*  RDW 15.1* 15.0* 14.8* 15.2* 15.2*  LYMPHSABS 1.9  --  2.1 1.2 1.8  MONOABS 0.5  --  0.9 0.5 0.7  EOSABS 0.0   --  0.0 0.0 0.0  BASOSABS 0.1  --  0.1 0.0 0.0   ------------------------------------------------------------------------------------------------------------------  Chemistries   Recent Labs Lab 08/29/15 0609 08/30/15 0348 08/31/15 0655 09/01/15 0921 09/02/15 0621  NA 137 137 147* 152* 150*  K 5.2* 4.6 3.5 3.5 2.8*  CL 111 119* 123* 129* 122*  CO2 12* 12* 11* 10* 19*  GLUCOSE 151* 135* 116* 142* 203*  BUN 10 14 10 13 14   CREATININE 1.13* 1.16* 0.91 1.13* 1.12*  CALCIUM 8.7* 8.2* 8.5* 8.8* 8.9  MG  --   --   --   --  2.6*  AST 305*  --   --   --   --   ALT 273*  --   --   --   --   ALKPHOS 78  --   --   --   --   BILITOT 1.1  --   --   --   --    ------------------------------------------------------------------------------------------------------------------ estimated creatinine clearance  is 84.6 mL/min (by C-G formula based on Cr of 1.12). ------------------------------------------------------------------------------------------------------------------ No results for input(s): TSH, T4TOTAL, T3FREE, THYROIDAB in the last 72 hours.  Invalid input(s): FREET3   Coagulation profile No results for input(s): INR, PROTIME in the last 168 hours. ------------------------------------------------------------------------------------------------------------------- No results for input(s): DDIMER in the last 72 hours. -------------------------------------------------------------------------------------------------------------------  Cardiac Enzymes No results for input(s): CKMB, TROPONINI, MYOGLOBIN in the last 168 hours.  Invalid input(s): CK ------------------------------------------------------------------------------------------------------------------ Invalid input(s): POCBNP  ---------------------------------------------------------------------------------------------------------------  Urinalysis    Component Value Date/Time   COLORURINE Straw 08/26/2012 1549    APPEARANCEUR Hazy 08/26/2012 1549   LABSPEC 1.013 08/26/2012 1549   PHURINE 6.0 08/26/2012 1549   GLUCOSEU Negative 08/26/2012 1549   HGBUR Negative 08/26/2012 1549   BILIRUBINUR Negative 08/26/2012 1549   KETONESUR Negative 08/26/2012 1549   PROTEINUR Negative 08/26/2012 1549   NITRITE Negative 08/26/2012 1549   LEUKOCYTESUR Negative 08/26/2012 1549     RADIOLOGY: No results found.  EKG: Orders placed or performed in visit on 09/08/13  . EKG 12-Lead    IMPRESSION AND PLAN:  Patient is a 34 year old female status post elective lap gastric bypass  #1. Hypernatremia at this time I feel the patient likely has free water deficit will start her on D5W follow her sodium levels.  #2 diabetes type 2 non-insulin-dependent blood sugars are currently stable expect sugars to be elevated with the D5, continue sliding scale insulin for now, resume metformin on discharge  #3 hypertension currently on metoprolol due to elevated heart rate continue that for now  #4 GERD add Pepcid to current treatment   All the records are reviewed and case discussed with ED provider. Management plans discussed with the patient, family and they are in agreement.  CODE STATUS:    Code Status Orders        Start     Ordered   08/28/15 1543  Full code   Continuous     08/28/15 1543       TOTAL TIME TAKING CARE OF THIS PATIENT: 55 minutes.    Auburn Bilberry M.D on 09/02/2015 at 1:30 PM  Between 7am to 6pm - Pager - [phone]  After 6pm go to [email or web address] - password [email or web address] [facility]  [place] Hospitalists  Office  [phone]  CC: Primary care physician; Dale , MD

## 2015-09-02 NOTE — Progress Notes (Signed)
[facility] Physicians - [place] at [facility]   PATIENT NAME: Shirley [name]    MR#:  [id number]  DATE OF BIRTH:  11-05-1981  SUBJECTIVE:  CHIEF COMPLAINT:  No chief complaint on file.  Patient in the hospital for lap gastric bypass and is postop day # 4.  Patient noted to be hypotensive and hypernatremic yesterday. Sodium improved today. No nausea, vomiting.  REVIEW OF SYSTEMS:    Review of Systems  Constitutional: Negative for fever and chills.  HENT: Negative for congestion and tinnitus.   Eyes: Negative for blurred vision and double vision.  Respiratory: Negative for cough, shortness of breath and wheezing.   Cardiovascular: Negative for chest pain, orthopnea and PND.  Gastrointestinal: Negative for nausea, vomiting, abdominal pain and diarrhea.  Genitourinary: Negative for dysuria and hematuria.  Neurological: Negative for dizziness, sensory change and focal weakness.  All other systems reviewed and are negative.   Nutrition: Bariatric Full liquid Tolerating Diet: Yes Tolerating PT: ambulatory.     DRUG ALLERGIES:   Allergies  Allergen Reactions  . Caffeine Other (See Comments)    Stomach issues  . Ibuprofen Other (See Comments)    Upset stomach    VITALS:  Blood pressure 118/66, pulse 83, temperature 98.6 F (37 C), temperature source Oral, resp. rate 20, height  (1.575 m), weight 117.75 kg (259 lb 9.5 oz), last menstrual period 07/28/2015, SpO2 100 %.  PHYSICAL EXAMINATION:   Physical Exam  GENERAL:  34 y.o.-year-old obese patient lying in the bed with no acute distress.  EYES: Pupils equal, round, reactive to light and accommodation. No scleral icterus. Extraocular muscles intact.  HEENT: Head atraumatic, normocephalic. Oropharynx and nasopharynx clear.  NECK:  Supple, no jugular venous distention. No thyroid enlargement, no tenderness.  LUNGS: Normal breath sounds bilaterally, no wheezing, rales, rhonchi. No use of accessory muscles of  respiration.  CARDIOVASCULAR: S1, S2 normal. No murmurs, rubs, or gallops.  ABDOMEN: Soft, nontender, nondistended. Bowel sounds present. No organomegaly or mass.  EXTREMITIES: No cyanosis, clubbing or edema b/l.    NEUROLOGIC: Cranial nerves II through XII are intact. No focal Motor or sensory deficits b/l.   PSYCHIATRIC: The patient is alert and oriented x 3. Good affect SKIN: No obvious rash, lesion, or ulcer.    LABORATORY PANEL:   CBC  Recent Labs Lab 09/02/15 0621  WBC 6.9  HGB 12.1  HCT 38.6  PLT 202   ------------------------------------------------------------------------------------------------------------------  Chemistries   Recent Labs Lab 08/29/15 0609  09/02/15 0621  NA 137  < > 150*  K 5.2*  < > 2.8*  CL 111  < > 122*  CO2 12*  < > 19*  GLUCOSE 151*  < > 203*  BUN 10  < > 14  CREATININE 1.13*  < > 1.12*  CALCIUM 8.7*  < > 8.9  AST 305*  --   --   ALT 273*  --   --   ALKPHOS 78  --   --   BILITOT 1.1  --   --   < > = values in this interval not displayed. ------------------------------------------------------------------------------------------------------------------  Cardiac Enzymes No results for input(s): TROPONINI in the last 168 hours. ------------------------------------------------------------------------------------------------------------------  RADIOLOGY:  No results found.   ASSESSMENT AND PLAN:   34 year old female with past medical history of diabetes, hypertension, morbid obesity, GERD who presented to the hospital for elective lap gastric bypass and is postoperative day #4. Patient noted to be hypernatremic and dehydrated  #1 hypernatremia-this is likely secondary  to free water loss and dehydration. -We'll continue D5W and follow sodium which is improving.  #2 hypokalemia-I will continue supplements and repeat level in the morning. -I'll also check a magnesium level. Hold HCTZ  #3 hypertension-continue metoprolol.  #4 type  2 diabetes-continue sliding scale insulin for now. Resume oral meds upon discharge.  #5 GERD-continue Protonix  #6 Status post lap gastric bypass postoperative day #4-continue care as per surgery   All the records are reviewed and case discussed with Care Management/Social Workerr. Management plans discussed with the patient, family and they are in agreement.  CODE STATUS: Full Code  DVT Prophylaxis: Lovenox  TOTAL TIME TAKING CARE OF THIS PATIENT: 25 minutes.   POSSIBLE D/C IN 1-2 DAYS, DEPENDING ON CLINICAL CONDITION.   Houston Siren M.D on 09/02/2015 at 12:20 PM  Between 7am to 6pm - Pager - [phone]  After 6pm go to [email or web address] - password [email or web address] [facility]  [place] Hospitalists  Office  [phone]  CC: Primary care physician; Dale Slovan, MD

## 2015-09-02 NOTE — Progress Notes (Signed)
Subjective: Interval History: none.  Objective: Vital signs in last 24 hours: Temp:  [98.3 F (36.8 C)-99.5 F (37.5 C)] 98.7 F (37.1 C) (10/09 1354) Pulse Rate:  [75-113] 92 (10/09 1354) Resp:  [16-24] 20 (10/09 0938) BP: (84-131)/(63-84) 120/84 mmHg (10/09 1354) SpO2:  [100 %] 100 % (10/09 2009)  Intake/Output from previous day: 10/08 0701 - 10/09 0700 In: 1609.7 [I.V.:1609.7] Out: 1150 [Urine:1150] Intake/Output this shift:    General appearance: alert and no distress Lungs: clear to auscultation bilaterally Abdomen: soft, non-tender; bowel sounds normal; no masses,  no organomegaly  Results for orders placed or performed during the hospital encounter of 08/28/15 (from the past 24 hour(s))  Glucose, capillary     Status: Abnormal   Collection Time: 09/01/15  9:31 PM  Result Value Ref Range   Glucose-Capillary 147 (H) 65 - 99 mg/dL  Basic metabolic panel     Status: Abnormal   Collection Time: 09/02/15  6:21 AM  Result Value Ref Range   Sodium 150 (H) 135 - 145 mmol/L   Potassium 2.8 (LL) 3.5 - 5.1 mmol/L   Chloride 122 (H) 101 - 111 mmol/L   CO2 19 (L) 22 - 32 mmol/L   Glucose, Bld 203 (H) 65 - 99 mg/dL   BUN 14 6 - 20 mg/dL   Creatinine, Ser 1.61 (H) 0.44 - 1.00 mg/dL   Calcium 8.9 8.9 - 09.6 mg/dL   GFR calc non Af Amer >60 >60 mL/min   GFR calc Af Amer >60 >60 mL/min   Anion gap 9 5 - 15  CBC with Differential/Platelet     Status: Abnormal   Collection Time: 09/02/15  6:21 AM  Result Value Ref Range   WBC 6.9 3.6 - 11.0 K/uL   RBC 5.41 (H) 3.80 - 5.20 MIL/uL   Hemoglobin 12.1 12.0 - 16.0 g/dL   HCT 04.5 40.9 - 81.1 %   MCV 71.4 (L) 80.0 - 100.0 fL   MCH 22.3 (L) 26.0 - 34.0 pg   MCHC 31.2 (L) 32.0 - 36.0 g/dL   RDW 91.4 (H) 78.2 - 95.6 %   Platelets 202 150 - 440 K/uL   Neutrophils Relative % 63 %   Neutro Abs 4.3 1.4 - 6.5 K/uL   Lymphocytes Relative 27 %   Lymphs Abs 1.8 1.0 - 3.6 K/uL   Monocytes Relative 10 %   Monocytes Absolute 0.7 0.2 - 0.9  K/uL   Eosinophils Relative 0 %   Eosinophils Absolute 0.0 0 - 0.7 K/uL   Basophils Relative 0 %   Basophils Absolute 0.0 0 - 0.1 K/uL  Magnesium     Status: Abnormal   Collection Time: 09/02/15  6:21 AM  Result Value Ref Range   Magnesium 2.6 (H) 1.7 - 2.4 mg/dL  Glucose, capillary     Status: Abnormal   Collection Time: 09/02/15  7:33 AM  Result Value Ref Range   Glucose-Capillary 168 (H) 65 - 99 mg/dL   Comment 1 Notify RN   Glucose, capillary     Status: Abnormal   Collection Time: 09/02/15 11:49 AM  Result Value Ref Range   Glucose-Capillary 157 (H) 65 - 99 mg/dL   Comment 1 Notify RN   Glucose, capillary     Status: Abnormal   Collection Time: 09/02/15  4:03 PM  Result Value Ref Range   Glucose-Capillary 171 (H) 65 - 99 mg/dL    Studies/Results: Ct Abdomen W Contrast  08/30/2015   CLINICAL DATA:  Status post sleeve  gastrectomy with duodenal switch 2 days ago. Fever and upper abdominal pain with tachycardia. Rule out leak at duodenal/ileal anastomosis.  EXAM: CT ABDOMEN WITH CONTRAST  TECHNIQUE: Multidetector CT imaging of the abdomen was performed using the standard protocol following bolus administration of intravenous contrast.  CONTRAST:  OMNIPAQUE IOHEXOL 300 MG/ML  SOLN  COMPARISON:  08/29/2015 fluoroscopy study.  CT of 02/14/2005.  FINDINGS: Lower chest: Bibasilar atelectasis. Mild cardiomegaly. Mild distal esophageal dilatation with contrast within. Example on image/series 8/2.  Hepatobiliary: Mild motion degradation throughout the abdomen. Mild hepatic steatosis. Cholecystectomy, without biliary ductal dilatation.  Pancreas: Normal, without mass or ductal dilatation.  Spleen: Small splenule.  Adrenals/Urinary Tract: Normal adrenal glands. Normal kidneys, without hydronephrosis.  Stomach/Bowel: Surgical changes about the stomach. Normal abdominal portions of the colon, terminal ileum, and appendix. The duodenum and proximal jejunum are normal in caliber. Mid small bowel  loops are positioned within the anterior abdomen, contrast filled. Measure maximally 3.5 cm, including on image/series 43/2. No focal transition point identified, but the this pelvis is not imaged. No pneumatosis or free intraperitoneal air. No evidence of postoperative fluid collection or extraluminal contrast extravasation.  Vascular/Lymphatic: Normal caliber of the aorta and branch vessels. No retroperitoneal or retrocrural adenopathy.  Other: No ascites. Subcutaneous air which is likely postoperative, including in the left side of the abdomen on image/ series 55/2.  Musculoskeletal: No acute osseous abnormality.  IMPRESSION: 1. Mildly motion degraded exam. 2. Surgical changes about the stomach. Mid small bowel loops which are mildly dilated and positioned within the far anterior abdomen. This could represent a postoperative adynamic ileus. Given anterior position, internal hernia cannot be entirely excluded, but is felt less likely (given absence of mesenteric or vascular abnormality). 3. Esophageal air fluid level suggests dysmotility or gastroesophageal reflux. 4. Mild hepatic steatosis. 5. Pelvis not imaged.   Electronically Signed   By: Jeronimo Greaves M.D.   On: 08/30/2015 12:21   Dg Ugi W/water Sol Cm  08/29/2015   CLINICAL DATA:  Post postop bariatric gastric bypass  EXAM: WATER SOLUBLE UPPER GI SERIES  TECHNIQUE: Single-column upper GI series was performed using water soluble contrast.  CONTRAST:  Gastrografin 60 cc  COMPARISON:  Upper GI series of February 12, 2015  FLUOROSCOPY TIME:  Fluoroscopy Time (in minutes and seconds): 0 minutes, 7 seconds  Number of Acquired Images:  1 cine loop and 4 spot images  FINDINGS: The patient ingested the Gastrografin without difficulty. The distal esophagus revealed no acute abnormality. The barium passed into the stomach without difficulty. No extraluminal contrast was observed.  IMPRESSION: There is no evidence of a leak of the ingested contrast material.    Electronically Signed   By: David  [place] M.D.   On: 08/29/2015 10:25    Scheduled Meds: . budesonide (PULMICORT) nebulizer solution  0.25 mg Nebulization BID  . enoxaparin  40 mg Subcutaneous Q12H  . insulin aspart  0-20 Units Subcutaneous TID WC  . insulin aspart  0-5 Units Subcutaneous QHS  . ketotifen  1 drop Both Eyes BID  . loratadine  10 mg Oral Daily  . metoprolol tartrate  25 mg Oral BID  . pantoprazole (PROTONIX) IV  40 mg Intravenous QHS  . potassium chloride  20 mEq Oral TID  . protein supplement  2 oz Oral TID   Continuous Infusions: . dextrose 1,000 mL (09/02/15 1753)   PRN Meds:acetaminophen (TYLENOL) oral liquid 160 mg/5 mL, albuterol, diphenhydrAMINE, HYDROcodone-acetaminophen, HYDROmorphone (DILAUDID) injection, ondansetron (ZOFRAN) IV, zolpidem  Assessment/Plan:overall  improved post syncopal event last night. Tolerating po well and voiding well. Reports several small stools without diarrhea. Presume metabolic acidosis associated with renal etiology. Improving. Hope for home in am if electrolytes improved.    LOS: 5 days   [name]

## 2015-09-02 NOTE — Progress Notes (Signed)
Approx. 22:20 Pt. had a syncopal episode in the BR with family. We were able to get her onto a wheel chair and out to the bed. She did not fall. Bp was 84/63 sitting and 109/70 lying. Pt. had been given metorprolol 50 mg and 10 mls  HYCET 7.5/325, 15 ml solution approx. 20 mins prior. BS was 141 at 21:28. Rapid response was called. The pt. slowly became more alert. D5% at 110 was infusing. Dr.Tyner was notified and ordered that her AM dose of metoprolol be held and a CBC with Diff be ordered for the AM. Family noted that Pt. became very sleepy yesterday after given the same pain med. They noted she was ambulating out in the lobby and became sleepy and sat down and slept there for a while. This pain med may have to be reconsidered as well . Pt's HR remained in the mid 80's until about 5:00 am, it then climbed back into the low 100's, Pt. was asleep at this time.

## 2015-09-02 NOTE — Progress Notes (Signed)
Pt HR elevated to the 130's when up to BR this AM.  Critical lab called this AM for Potassium of 2.8. [name] notified and potassium supplement ordered. Metoprolol 25 mg bib also ordered for the Pt.

## 2015-09-03 ENCOUNTER — Encounter: Payer: Self-pay | Admitting: Internal Medicine

## 2015-09-03 LAB — GLUCOSE, CAPILLARY
Glucose-Capillary: 207 mg/dL — ABNORMAL HIGH (ref 65–99)
Glucose-Capillary: 140 mg/dL — ABNORMAL HIGH (ref 65–99)
Glucose-Capillary: 116 mg/dL — ABNORMAL HIGH (ref 65–99)

## 2015-09-03 LAB — POTASSIUM: Potassium: 3.5 mmol/L (ref 3.5–5.1)

## 2015-09-03 LAB — BASIC METABOLIC PANEL
Anion gap: 6 (ref 5–15)
BUN: 11 mg/dL (ref 6–20)
CO2: 23 mmol/L (ref 22–32)
Calcium: 8.6 mg/dL — ABNORMAL LOW (ref 8.9–10.3)
Chloride: 117 mmol/L — ABNORMAL HIGH (ref 101–111)
Creatinine, Ser: 0.87 mg/dL (ref 0.44–1.00)
GFR calc Af Amer: >60 mL/min (ref >60)
GFR calc non Af Amer: >60 mL/min (ref >60)
Glucose, Bld: 192 mg/dL — ABNORMAL HIGH (ref 65–99)
Potassium: 2.9 mmol/L — CL (ref 3.5–5.1)
Sodium: 146 mmol/L — ABNORMAL HIGH (ref 135–145)

## 2015-09-03 MED ORDER — POTASSIUM CHLORIDE 10 MEQ/100ML IV SOLN
10.0000 meq | INTRAVENOUS | Status: AC
  Administered 2015-09-03 (×4): 10 meq via INTRAVENOUS
  Filled 2015-09-03 (×4): qty 100

## 2015-09-03 MED ORDER — ENOXAPARIN SODIUM 40 MG/0.4ML ~~LOC~~ SOLN: 40.0000 mg | SUBCUTANEOUS | Status: DC

## 2015-09-03 MED ORDER — METOPROLOL TARTRATE 25 MG PO TABS: 25.0000 mg | ORAL_TABLET | Freq: Two times a day (BID) | ORAL | Status: DC

## 2015-09-03 MED ORDER — CANAGLIFLOZIN 100 MG PO TABS
100.0000 mg | ORAL_TABLET | Freq: Every day | ORAL | Status: DC
  Administered 2015-09-03: 100 mg via ORAL
  Filled 2015-09-03 (×2): qty 1

## 2015-09-03 MED ORDER — METFORMIN HCL 500 MG PO TABS: 500.0000 mg | ORAL_TABLET | Freq: Two times a day (BID) | ORAL | Status: DC

## 2015-09-03 NOTE — Progress Notes (Signed)
[facility] [place] at [facility]   PATIENT NAME: Shirley [name]    MR#:  [id number]  DATE OF BIRTH:  [birth year]  SUBJECTIVE:   Patient in the hospital for lap gastric bypass and is postop day # 5.  Patient noted to be hypotensive and hypernatremic . Sodium improved today. No nausea, vomiting.tolerating po diet  REVIEW OF SYSTEMS:    Review of Systems  Constitutional: Negative for fever and chills.  HENT: Negative for congestion and tinnitus.   Eyes: Negative for blurred vision and double vision.  Respiratory: Negative for cough, shortness of breath and wheezing.   Cardiovascular: Negative for chest pain, orthopnea and PND.  Gastrointestinal: Negative for nausea, vomiting, abdominal pain and diarrhea.  Genitourinary: Negative for dysuria and hematuria.  Neurological: Negative for dizziness, sensory change and focal weakness.  All other systems reviewed and are negative.   Nutrition: Bariatric Full liquid Tolerating Diet: Yes Tolerating PT: ambulatory.     DRUG ALLERGIES:   Allergies  Allergen Reactions  . Caffeine Other (See Comments)    Stomach issues  . Ibuprofen Other (See Comments)    Upset stomach    VITALS:  Blood pressure 110/66, pulse 95, temperature 98.2 F (36.8 C), temperature source Oral, resp. rate 16, height  (1.575 m), weight 117.75 kg (259 lb 9.5 oz), last menstrual period 07/28/2015, SpO2 100 %.  PHYSICAL EXAMINATION:   Physical Exam  GENERAL:  34 y.o.-year-old obese patient lying in the bed with no acute distress. obese EYES: Pupils equal, round, reactive to light and accommodation. No scleral icterus. Extraocular muscles intact.  HEENT: Head atraumatic, normocephalic. Oropharynx and nasopharynx clear.  NECK:  Supple, no jugular venous distention. No thyroid enlargement, no tenderness.  LUNGS: Normal breath sounds bilaterally, no wheezing, rales, rhonchi. No use of accessory muscles of respiration.  CARDIOVASCULAR:  S1, S2 normal. No murmurs, rubs, or gallops.  ABDOMEN: Soft, nontender, nondistended. Bowel sounds present. No organomegaly or mass.  EXTREMITIES: No cyanosis, clubbing or edema b/l.    NEUROLOGIC: Cranial nerves II through XII are intact. No focal Motor or sensory deficits b/l.   PSYCHIATRIC: The patient is alert and oriented x 3. Good affect SKIN: No obvious rash, lesion, or ulcer.    LABORATORY PANEL:   CBC  Recent Labs Lab 09/02/15 0621  WBC 6.9  HGB 12.1  HCT 38.6  PLT 202   ------------------------------------------------------------------------------------------------------------------  Chemistries   Recent Labs Lab 08/29/15 0609  09/02/15 0621 09/03/15 0627  NA 137  < > 150* 146*  K 5.2*  < > 2.8* 2.9*  CL 111  < > 122* 117*  CO2 12*  < > 19* 23  GLUCOSE 151*  < > 203* 192*  BUN 10  < > 14 11  CREATININE 1.13*  < > 1.12* 0.87  CALCIUM 8.7*  < > 8.9 8.6*  MG  --   --  2.6*  --   AST 305*  --   --   --   ALT 273*  --   --   --   ALKPHOS 78  --   --   --   BILITOT 1.1  --   --   --   < > = values in this interval not displayed. ------------------------------------------------------------------------------------------------------------------  Cardiac Enzymes No results for input(s): TROPONINI in the last 168 hours. ------------------------------------------------------------------------------------------------------------------  RADIOLOGY:  No results found.   ASSESSMENT AND PLAN:   34 year old female with past medical history of diabetes, hypertension, morbid obesity, GERD  who presented to the hospital for elective lap gastric bypass and is postoperative day #4. Patient noted to be hypernatremic and dehydrated  #1 hypernatremia-this is likely secondary to free water loss and dehydration. -recieved D5W and follow sodium which is improving. -152---146  #2 hypokalemia-will continue supplements and repeat level in the morning. -I'll also check a  magnesium level.  -d/c hctz for now -K upto 2.9 -getting IV kcl this am. If repeat k >3.0 ok to d/c on po kcl for next few days  #3 hypertension-continue metoprolol.  #4 type 2 diabetes-continue sliding scale insulin for now. Resume oral meds upon discharge.(metformin,invokana and [place])  #5 GERD-continue Protonix  #6 Status post lap gastric bypass postoperative day #5 -continue care as per surgery  Ok to d/c home if K is >3.0 All the records are reviewed and case discussed with Care Management/Social Workerr. Management plans discussed with the patient, family and they are in agreement.  CODE STATUS: Full Code  DVT Prophylaxis: Lovenox  TOTAL TIME TAKING CARE OF THIS PATIENT: 25 minutes.   Avrianna Smart M.D on 09/03/2015 at 12:24 PM  Between 7am to 6pm - Pager - [phone]  After 6pm go to [email or web address] - password [email or web address] [facility]  [facility]  Office  [phone]  CC: Primary care physician; Dale Sargeant, MD

## 2015-09-03 NOTE — Discharge Planning (Signed)
Pt IV removed. DC papers given, explained and educated.  VSS and RN assessment revealed stability for DC to home. Pt told of suggested FU appts and given script.  Pain meds, lovenox and nausea meds at home per pt.  Pt potassium increased to 3.5 at DC. Pt will be wheeled to front and family transporting home via car.

## 2015-09-03 NOTE — Progress Notes (Addendum)
MEDICATION RELATED CONSULT NOTE - INITIAL   Pharmacy Consult for potassium supplementation Indication: Low K  Allergies  Allergen Reactions  . Caffeine Other (See Comments)    Stomach issues  . Ibuprofen Other (See Comments)    Upset stomach    Patient Measurements: Height:  (157.5 cm) Weight: 259 lb 9.5 oz (117.75 kg) IBW/kg (Calculated) : 50.1   Vital Signs: Temp: 98 F (36.7 C) (10/10 0551) Temp Source: Oral (10/10 0551) BP: 133/80 mmHg (10/10 0551) Pulse Rate: 91 (10/10 0551) Intake/Output from previous day: 10/09 0701 - 10/10 0700 In: 1352.8 [P.O.:240; I.V.:1112.8] Out: 700 [Urine:700] Intake/Output from this shift:    Labs:  Recent Labs  09/01/15 0921 09/02/15 0621 09/03/15 0627  WBC  --  6.9  --   HGB  --  12.1  --   HCT  --  38.6  --   PLT  --  202  --   CREATININE 1.13* 1.12* 0.87  MG  --  2.6*  --    Estimated Creatinine Clearance: 108.9 mL/min (by C-G formula based on Cr of 0.87).   Microbiology: No results found for this or any previous visit (from the past 720 hour(s)).  Medical History: Past Medical History  Diagnosis Date  . Depression   . Diabetes mellitus (HCC)   . Migraine   . Hypertension   . IBS (irritable bowel syndrome)   . GERD (gastroesophageal reflux disease)   . Anemia   . Complication of anesthesia     itching after surgery 2005, 2006  . PONV (postoperative nausea and vomiting)     nausea  . Asthma   . Fatty liver     Assessment: 34 yo female here s/p lap gastric bypass on KCL 20 mEq PO TID with K from 2.8 yesterday to only 2.9 today -  All three doses charted as given yesterday  Scr 0.87, CrCl ~ 109 ml/min  Goal of Therapy:  K >3.5 (3.5-5.0)  Plan:  Will order KCl 10 mEq IV x4 runs Recheck KCl 2 h after last dose at 1500  Crist Fat L 09/03/2015,8:55 AM   Addendum:  K on recheck is 3.5, no further supplementation at this time.   Crist Fat, PharmD, BCPS Clinical Pharmacist 09/03/2015 3:38  PM

## 2015-09-04 LAB — GLUCOSE, CAPILLARY: Glucose-Capillary: 91 mg/dL (ref 65–99)

## 2015-09-05 NOTE — Discharge Summary (Signed)
Physician Discharge Summary  Patient ID: Shirley [name] MRN: [id number] DOB/AGE: Feb 02, 1981 34 y.o.  Admit date: 08/28/2015 Discharge date: 09/05/2015  Admission Diagnoses: Morbid obesity with diabetes and hypertension  Discharge Diagnoses: Same with tachycardia and hyponatremia and hypokalemia Active Problems:   Bariatric surgery status    Procedure(s): LAPAROSCOPIC GASTRIC RESTRICTIVE DUODENAL PROCEDURE (DUODENAL SWITCH) (N/A) LAPAROSCOPIC PARAESOPHAGEAL HERNIA REPAIR (N/A)  Discharged Condition: good  Hospital Course: Patient admitted on 08/28/15 for laparoscopic duodenal switch with repair of hiatal hernia.  She tolerated the operative procedure well.  The following day the patient noted have a low-grade temperature at 99 and a heart rate of 108.  Her white count was noted to be elevated at approximately 17,000.This prompted a contrast study to rule out a leak in the patient's abdominal exam was noted be benign.  The patient had no evidence of a leak in the area of the anastomosis.  IV hydration was further pursued this was also accompanied by encouragement of pulmonary toilet.  A hospitalist consult was placed to assist with management of diabetes and hypertension.  An insulin sliding scale was initiated along with when necessary antihypertensives.  On 08/30/15 the patient noted have continued tachycardia with a pulse of approximate 120.  Her white count was improved slightly and was fairly unremarkable.  A CT scan of the abdomen was obtained to confirm the absence of a leak in the area of the anastomosis.  No leak was identified in the contrast noted to reach the right colon.  The patient was allowed to continue clear liquids and continue Lovenox therapy.  A consult for the cardiology service was placed on 10 7 because of persistent tachycardia.  The patient's white count a time and noted be normalized at the same time the patient noted have progressive hypernatremia and evidence of  metabolic acidosis with a normal lactic acid level.  This is felt to be secondary to hydration and replacement with normal saline.  She had transition of IV hydration to dextrose with sodium bicarbonate to finally dextrose alone.  The patient also had initiation of metoprolol.  With this combination the patient's heart rate returned to normal and her electrolytes improved significantly.  She did require potassium replacement presumed secondary to her metabolic acidosis.  By 09/03/15 the patient's serum potassium was corrected to 3.5.  He was taking by mouth adequately, voiding without catheterization, ambulating well and having stools with flatus.  We this improvement she is felt to be a candidate for discharge.  Consults: cardiology and hospitalist  Significant Diagnostic Studies: CT scan of the abdomen  Treatments: IV hydration and antibiotics: Zosyn  Discharge Exam: Blood pressure 116/91, pulse 98, temperature 98.5 F (36.9 C), temperature source Oral, resp. rate 18, height 5\' 2"  (1.575 m), weight 117.75 kg (259 lb 9.5 oz), last menstrual period 07/28/2015, SpO2 100 %. General appearance: alert, cooperative and no distress, lungs clear and abdomen benign with healing incisions  Disposition: 01-Home or Self Care  Discharge Instructions    Ambulate hourly while awake    Complete by:  As directed      Call MD for:  difficulty breathing, headache or visual disturbances    Complete by:  As directed      Call MD for:  persistant dizziness or light-headedness    Complete by:  As directed      Call MD for:  persistant nausea and vomiting    Complete by:  As directed      Call MD for:  redness, tenderness, or signs of infection (pain, swelling, redness, odor or green/yellow discharge around incision site)    Complete by:  As directed      Call MD for:  severe uncontrolled pain    Complete by:  As directed      Call MD for:  temperature >101 F    Complete by:  As directed      Diet bariatric  full liquid    Complete by:  As directed      Discharge instructions    Complete by:  As directed   F/u in my office in two weeks, accu checks bid to call PCP if greater than 160     Incentive spirometry    Complete by:  As directed   Perform hourly while awake            Medication List    STOP taking these medications        INVOKANA 100 MG Tabs tablet  Generic drug:  canagliflozin     JANUVIA 100 MG tablet  Generic drug:  sitaGLIPtin     losartan-hydrochlorothiazide 100-12.5 MG tablet  Commonly known as:  HYZAAR     pantoprazole 40 MG tablet  Commonly known as:  PROTONIX      TAKE these medications        albuterol 108 (90 BASE) MCG/ACT inhaler  Commonly known as:  PROVENTIL HFA;VENTOLIN HFA  Inhale into the lungs every 6 (six) hours as needed for wheezing or shortness of breath.     azelastine 0.05 % ophthalmic solution  Commonly known as:  OPTIVAR  1 drop 2 (two) times daily.     beclomethasone 40 MCG/ACT inhaler  Commonly known as:  QVAR  Inhale 1 puff into the lungs daily.     enoxaparin 40 MG/0.4ML injection  Commonly known as:  LOVENOX  inject 0.4 milliliters by mouth subcutaneously once daily as directed     enoxaparin 40 MG/0.4ML injection  Commonly known as:  LOVENOX  Inject 0.4 mLs (40 mg total) into the skin daily.     glucose blood test strip  Commonly known as:  BAYER CONTOUR NEXT TEST  Use 1 strip two times daily.     levocetirizine 5 MG tablet  Commonly known as:  XYZAL  Take 5 mg by mouth every evening.     metoprolol tartrate 25 MG tablet  Commonly known as:  LOPRESSOR  Take 1 tablet (25 mg total) by mouth 2 (two) times daily.     PATANASE 0.6 % Soln  Generic drug:  Olopatadine HCl  Place into the nose as needed.     zolpidem 5 MG tablet  Commonly known as:  AMBIEN  Take 5 mg by mouth at bedtime as needed.         Signed: [name] 09/05/2015, 11:22 AM

## 2015-11-01 ENCOUNTER — Encounter: Payer: Self-pay | Admitting: Internal Medicine

## 2015-11-01 ENCOUNTER — Ambulatory Visit (INDEPENDENT_AMBULATORY_CARE_PROVIDER_SITE_OTHER): Payer: 59 | Admitting: Internal Medicine

## 2015-11-01 ENCOUNTER — Other Ambulatory Visit (INDEPENDENT_AMBULATORY_CARE_PROVIDER_SITE_OTHER): Payer: 59 | Admitting: *Deleted

## 2015-11-01 VITALS — BP 118/64 | HR 73 | Temp 98.6°F | Resp 12 | Wt 231.6 lb

## 2015-11-01 DIAGNOSIS — E119 Type 2 diabetes mellitus without complications: Secondary | ICD-10-CM

## 2015-11-01 DIAGNOSIS — R809 Proteinuria, unspecified: Secondary | ICD-10-CM | POA: Diagnosis not present

## 2015-11-01 DIAGNOSIS — IMO0001 Reserved for inherently not codable concepts without codable children: Secondary | ICD-10-CM

## 2015-11-01 LAB — POCT GLYCOSYLATED HEMOGLOBIN (HGB A1C): Hemoglobin A1C: 5.9

## 2015-11-01 NOTE — Progress Notes (Signed)
Patient ID: Shirley DomRaytarsha M Genao, female   DOB: May 23, 1981, 34 y.o.   MRN: [id number]  HPI: Shirley [name] is a 34 y.o.-year-old female, returning for f/u for DM2, dx 2005, non-insulin-dependent, uncontrolled, with complications (MAU). Last visit 3 mo ago.  She had GBP sx (sleeve gastrectomy) on 08/28/2015. She developed hypoglycemia and hypotension after the Sx. She had syncope in the hospital.  Last hemoglobin A1c was: 07/15/2015: 7.1% Lab Results  Component Value Date   HGBA1C 7.5* 08/10/2015   HGBA1C 7.2* 04/07/2015   HGBA1C 7.0* 12/07/2014  07/13/2014: 7.2%  - at work 01/2014: 7.8% 08/2013: 7.2%  Pt is now off her DM meds - she was on a regimen of: - Januvia 100 mg in am - Metformin XR (osm) 1000 mg tabs 2x a day with meals >> some loose stools - Invokana 100 - added 03/2014 She was on Janumet XR 1000-100 mg po with dinner (used to cut them in half to swallow them better!!!) She was on Metformin in the past >> diarrhea at max dose  Pt checks her sugars 0-3x a day and they are: - am: 130-147 >> 122-165 (1x 188) >> 120-142 >> n/c >> 124-145 >> 135-140 >> 124-162 >> 88-120, 139, 165x1 - 2h after b'fast: 113-171 (1x 207) >> 98-164, 187 x1>> 115-148, 172 >> 133-144, 194 >> 150s >> 125-141 >> 85 - before lunch: n/c >> 111-129 >> 87, 104-135, 184 >> 98-120 >> 104-149, 161 >> n/c - 2h after lunch: 140-165 >> 127-199 (1x 213) >> 101-177 >> 144, 153 >> n/c >> 145-160 >> 121-174 >> 87, 100-121, 146 - before dinner: 120-141, 195 x1 >> 141, 200 >> 122-160, 181 (snack before leaves work) >> 98-120 >> 131-162 >> 100-136 - 2h after dinner: n/c  - bedtime: n/c - nighttime: n/c No lows. Lowest sugar was 111 >> 98 >> 85;? if  she has hypoglycemia awareness. Highest sugar was 195x1 >> 200 >> 184 >> 200s (steroids) >> 170 >> 165  Pt's meals are: - Breakfast: sliced apples, 2 Nutrigrain bars - Lunch: leftovers from dinner; sandwich - Dinner: hamburgers; chicken + veggies + starch + cookie or cake  on weekends - Snacks: yoghurt ([place]). No more popcorn >> sugars better at dinnertime.  - no CKD, last BUN/creatinine normal:  Lab Results  Component Value Date   BUN 11 09/03/2015   CREATININE 0.87 09/03/2015  She is on Losartan. Last ACR: 30.1 in 07/21/2013 - No HL: Lab Results  Component Value Date   CHOL 177 08/10/2015   HDL 59 08/10/2015   LDLCALC 103* 08/10/2015   TRIG 75 08/10/2015  - [facility] No DR.  - no numbness and tingling in her feet.  She also has a history of vit D def., HTN, GERD. Had a normal gastric emptying study.   I reviewed pt's medications, allergies, PMH, social hx, family hx, and changes were documented in the history of present illness. Otherwise, unchanged from my initial visit note.  ROS: Constitutional: no weight gain/loss, no fatigue, no subjective hyperthermia/hypothermia Eyes: no blurry vision, no xerophthalmia ENT: no sore throat, no nodules palpated in throat, no dysphagia/odynophagia, no hoarseness Cardiovascular: no CP/SOB/no palpitations/+ leg swelling Respiratory: no cough/SOB Gastrointestinal: no N/V/D/C Musculoskeletal: no muscle/no joint aches Skin: no rashes Neurological: no tremors/numbness/tingling/dizziness  PE: BP 118/64 mmHg  Pulse 73  Temp(Src) 98.6 F (37 C) (Oral)  Resp 12  Wt 231 lb 9.6 oz (105.053 kg)  SpO2 97% Body mass  index is 42.35 kg/(m^2). Wt Readings from Last 3 Encounters:  11/01/15 231 lb 9.6 oz (105.053 kg)  08/28/15 259 lb 9.5 oz (117.75 kg)  08/16/15 259 lb 9.6 oz (117.754 kg)   Constitutional: obese, in NAD Eyes: PERRLA, EOMI, no exophthalmos ENT: moist mucous membranes, no thyromegaly, no cervical lymphadenopathy Cardiovascular: RRR, No MRG Respiratory: CTA B Gastrointestinal: abdomen soft, NT, ND, BS+ Musculoskeletal: no deformities, strength intact in all 4 Skin: moist, warm, no rashes, acanthosis nigricans on neck; hirsutism face, chest Neurological: no  tremor with outstretched hands, DTR normal in all 4   ASSESSMENT: 1. DM2, non-insulin-dependent, uncontrolled, without complications - MAU  PLAN:  1. Patient with long-standing, fairly well controlled diabetes, on oral antidiabetic regimen, now off all her DM meds after her gastric sleeve procedure! Sugars are close to goal. Will let her stay off her meds, while continuing to check sugars as she had hypoglycemia after the GBP. If sugars increase >> will need to add back Metformin. Patient Instructions  Please stay off all diabetes meds for now.   Please let me know if the sugars are consistently >130 before a meal and >180 after a meal.  Please come back for a follow-up appointment in 3 months.  - continue checking sugars at different times of the day - check 2 times a day, rotating checks  - UTD with eye exams - checked HbA1C >> 5.9% (great decrease!) - Return to clinic in 3 mo with sugar log. After this, we may spread appts to q 6 mo.

## 2015-11-01 NOTE — Patient Instructions (Signed)
Please stay off all diabetes meds for now.   Please let me know if the sugars are consistently >130 before a meal and >180 after a meal.  Please come back for a follow-up appointment in 3 months.

## 2015-11-13 ENCOUNTER — Ambulatory Visit (INDEPENDENT_AMBULATORY_CARE_PROVIDER_SITE_OTHER): Payer: 59 | Admitting: Internal Medicine

## 2015-11-13 ENCOUNTER — Encounter: Payer: Self-pay | Admitting: Internal Medicine

## 2015-11-13 VITALS — BP 118/90 | HR 72 | Temp 98.4°F | Resp 18 | Ht 61.5 in | Wt 224.0 lb

## 2015-11-13 DIAGNOSIS — F439 Reaction to severe stress, unspecified: Secondary | ICD-10-CM

## 2015-11-13 DIAGNOSIS — E119 Type 2 diabetes mellitus without complications: Secondary | ICD-10-CM | POA: Diagnosis not present

## 2015-11-13 DIAGNOSIS — Z0000 Encounter for general adult medical examination without abnormal findings: Secondary | ICD-10-CM | POA: Diagnosis not present

## 2015-11-13 DIAGNOSIS — K219 Gastro-esophageal reflux disease without esophagitis: Secondary | ICD-10-CM

## 2015-11-13 DIAGNOSIS — Z658 Other specified problems related to psychosocial circumstances: Secondary | ICD-10-CM

## 2015-11-13 DIAGNOSIS — E559 Vitamin D deficiency, unspecified: Secondary | ICD-10-CM

## 2015-11-13 DIAGNOSIS — I10 Essential (primary) hypertension: Secondary | ICD-10-CM | POA: Diagnosis not present

## 2015-11-13 DIAGNOSIS — IMO0001 Reserved for inherently not codable concepts without codable children: Secondary | ICD-10-CM

## 2015-11-13 DIAGNOSIS — R809 Proteinuria, unspecified: Secondary | ICD-10-CM

## 2015-11-13 NOTE — Progress Notes (Signed)
Pre-visit discussion using our clinic review tool. No additional management support is needed unless otherwise documented below in the visit note.  

## 2015-11-13 NOTE — Progress Notes (Signed)
Patient ID: Shirley [name], female   DOB: 1981/05/27, 34 y.o.   MRN: [id number]   Subjective:    Patient ID: Shirley [name], female    DOB: 06-18-1981, 34 y.o.   MRN: [id number]  HPI  Patient with past history of hypertension, GERD, diabetes and recent bariatric surgery.  She comes in today to follow up on these issues as well as for a complete physical exam.  She is s/p gastric surgery.  Has lost weight.  Feels better.  Watching her diet.  Plans to exercise more.  No chest pain or tightness.  No sob.  No acid reflux.  She is swallowing well.  No abdominal pain or cramping.  Bowels stable.  Cycles normal.  LMP started yesterday.  Has noticed some mood swings.  Discussed with her today.  Does not feel she needs any further intervention.  Taking vitamin D3.  Sugars and blood pressure have been doing well on no medication.     Past Medical History  Diagnosis Date  . Depression   . Diabetes mellitus (HCC)   . Migraine   . Hypertension   . IBS (irritable bowel syndrome)   . GERD (gastroesophageal reflux disease)   . Anemia   . Complication of anesthesia     itching after surgery 2005, 2006  . PONV (postoperative nausea and vomiting)     nausea  . Asthma   . Fatty liver    Past Surgical History  Procedure Laterality Date  . Cholecystectomy  2005  . 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 Laparoscopic gastric restrictive duodenal procedure (duodenal switch) N/A 08/28/2015    Procedure: LAPAROSCOPIC GASTRIC RESTRICTIVE DUODENAL PROCEDURE (DUODENAL SWITCH);  Surgeon: [name], MD;  Location: [facility] ORS;  Service: General;  Laterality: N/A;   Family History  Problem Relation Age of Onset  . Hypertension Mother   . Diabetes Mother   . Asthma Brother   . Breast cancer Neg Hx   . Colon cancer Neg Hx    Social History   Social History  . Marital Status: Single    Spouse Name: N/A  . Number of Children: 0  . Years of Education: N/A   Occupational History  .     Social  History Main Topics  . Smoking status: Never Smoker   . Smokeless tobacco: Never Used  . Alcohol Use: No  . Drug Use: No  . Sexual Activity: Not Asked   Other Topics Concern  . None   Social History Narrative    Outpatient Encounter Prescriptions as of 11/13/2015  Medication Sig  . albuterol (PROVENTIL HFA;VENTOLIN HFA) 108 (90 BASE) MCG/ACT inhaler Inhale into the lungs every 6 (six) hours as needed for wheezing or shortness of breath.  Marland Kitchen azelastine (OPTIVAR) 0.05 % ophthalmic solution 1 drop 2 (two) times daily.  . beclomethasone (QVAR) 40 MCG/ACT inhaler Inhale 1 puff into the lungs daily.  Marland Kitchen glucose blood (BAYER CONTOUR NEXT TEST) test strip Use 1 strip two times daily.  Marland Kitchen levocetirizine (XYZAL) 5 MG tablet Take 5 mg by mouth every evening.  . Olopatadine HCl (PATANASE) 0.6 % SOLN Place into the nose as needed.  . ondansetron (ZOFRAN-ODT) 4 MG disintegrating tablet dissolve 1 tablet ON TONGUE every 4 to 6 hours if needed  . [DISCONTINUED] losartan-hydrochlorothiazide (HYZAAR) 100-12.5 MG tablet Take 1 tablet by mouth daily.   Marland Kitchen enoxaparin (LOVENOX) 40 MG/0.4ML injection Inject 0.4 mLs (40 mg total) into the skin daily.  No facility-administered encounter medications on file as of 11/13/2015.    Review of Systems  Constitutional:       She is s/p surgery.  Lost weight.  Has adjusted her diet.  Feels better.    HENT: Negative for congestion and sinus pressure.   Eyes: Negative for pain and visual disturbance.  Respiratory: Negative for cough, chest tightness and shortness of breath.   Cardiovascular: Negative for chest pain, palpitations and leg swelling.  Gastrointestinal: Negative for nausea, vomiting, abdominal pain and diarrhea.  Genitourinary: Negative for dysuria and difficulty urinating.  Musculoskeletal: Negative for back pain and joint swelling.  Skin: Negative for color change and rash.  Neurological: Negative for dizziness, light-headedness and headaches.    Hematological: Negative for adenopathy. Does not bruise/bleed easily.  Psychiatric/Behavioral: Negative for dysphoric mood and agitation.       Objective:     Blood pressure rechecked by me:  122/84  Physical Exam  Constitutional: She is oriented to person, place, and time. She appears well-developed and well-nourished. No distress.  HENT:  Nose: Nose normal.  Mouth/Throat: Oropharynx is clear and moist.  Eyes: Right eye exhibits no discharge. Left eye exhibits no discharge. No scleral icterus.  Neck: Neck supple. No thyromegaly present.  Cardiovascular: Normal rate and regular rhythm.   Pulmonary/Chest: Breath sounds normal. No accessory muscle usage. No tachypnea. No respiratory distress. She has no decreased breath sounds. She has no wheezes. She has no rhonchi. Right breast exhibits no inverted nipple, no mass, no nipple discharge and no tenderness (no axillary adenopathy). Left breast exhibits no inverted nipple, no mass, no nipple discharge and no tenderness (no axilarry adenopathy).  Abdominal: Soft. Bowel sounds are normal. There is no tenderness.  Musculoskeletal: She exhibits no edema or tenderness.  Lymphadenopathy:    She has no cervical adenopathy.  Neurological: She is alert and oriented to person, place, and time.  Skin: Skin is warm. No rash noted. No erythema.  Psychiatric: She has a normal mood and affect. Her behavior is normal.    BP 118/90 mmHg  Pulse 72  Temp(Src) 98.4 F (36.9 C) (Oral)  Resp 18  Ht 5' 1.5" (1.562 m)  Wt 224 lb (101.606 kg)  BMI 41.64 kg/m2  SpO2 100%  LMP 11/12/2015 (Exact Date) Wt Readings from Last 3 Encounters:  11/13/15 224 lb (101.606 kg)  11/01/15 231 lb 9.6 oz (105.053 kg)  08/28/15 259 lb 9.5 oz (117.75 kg)     Lab Results  Component Value Date   WBC 6.9 09/02/2015   HGB 12.1 09/02/2015   HCT 38.6 09/02/2015   PLT 202 09/02/2015   GLUCOSE 192* 09/03/2015   CHOL 177 08/10/2015   TRIG 75 08/10/2015   HDL 59  08/10/2015   LDLCALC 103* 08/10/2015   ALT 273* 08/29/2015   AST 305* 08/29/2015   NA 146* 09/03/2015   K 3.5 09/03/2015   CL 117* 09/03/2015   CREATININE 0.87 09/03/2015   BUN 11 09/03/2015   CO2 23 09/03/2015   TSH 1.120 08/10/2015   HGBA1C 5.9 11/01/2015       Assessment & Plan:   Problem List Items Addressed This Visit    GERD (gastroesophageal reflux disease)    No upper symptoms and no acid reflux.  On no medication.  Follow.       Hypertension - Primary    Blood pressure has been under good control.  Off medication. Follow pressures.  Follow metabolic panel.  Microalbuminuria    Off medication.  Follow.        Severe obesity (BMI >= 40) (HCC)    S/p gastric surgery.   Has lost weight.  Follow.  Discussed diet and exercise.        Stress    Some mood swings as outlined.  Does not feel needs anything more at this point.  Follow.        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  Sugars doing well.  Continue diet, exercise and weight loss.  We discussed diet and exercise today.  Follow metabolic panel and a1c.   Lab Results  Component Value Date   HGBA1C 5.9 11/01/2015        Vitamin D deficiency    On vitamin D supplementes.            Dale DurhamSCOTT, Tamari Redwine, MD

## 2015-11-19 ENCOUNTER — Encounter: Payer: Self-pay | Admitting: Internal Medicine

## 2015-11-19 NOTE — Assessment & Plan Note (Signed)
Off medication.  Follow.

## 2015-11-19 NOTE — Assessment & Plan Note (Signed)
On vitamin D supplementes.

## 2015-11-19 NOTE — Assessment & Plan Note (Signed)
S/p gastric surgery.   Has lost weight.  Follow.  Discussed diet and exercise.

## 2015-11-19 NOTE — Assessment & Plan Note (Signed)
Off medication.  Sugars doing well.  Continue diet, exercise and weight loss.  We discussed diet and exercise today.  Follow metabolic panel and a1c.   Lab Results  Component Value Date   HGBA1C 5.9 11/01/2015

## 2015-11-19 NOTE — Assessment & Plan Note (Signed)
Blood pressure has been under good control.  Off medication.  Follow pressures.  Follow metabolic panel.   

## 2015-11-19 NOTE — Assessment & Plan Note (Signed)
No upper symptoms and no acid reflux.  On no medication.  Follow.

## 2015-11-19 NOTE — Assessment & Plan Note (Signed)
Some mood swings as outlined.  Does not feel needs anything more at this point.  Follow.

## 2016-01-29 ENCOUNTER — Ambulatory Visit (INDEPENDENT_AMBULATORY_CARE_PROVIDER_SITE_OTHER): Payer: 59 | Admitting: Internal Medicine

## 2016-01-29 ENCOUNTER — Other Ambulatory Visit (INDEPENDENT_AMBULATORY_CARE_PROVIDER_SITE_OTHER): Payer: 59 | Admitting: *Deleted

## 2016-01-29 ENCOUNTER — Encounter: Payer: Self-pay | Admitting: Internal Medicine

## 2016-01-29 VITALS — BP 120/80 | HR 80 | Temp 98.4°F | Resp 12 | Wt 217.0 lb

## 2016-01-29 DIAGNOSIS — IMO0001 Reserved for inherently not codable concepts without codable children: Secondary | ICD-10-CM

## 2016-01-29 DIAGNOSIS — R809 Proteinuria, unspecified: Secondary | ICD-10-CM

## 2016-01-29 DIAGNOSIS — E119 Type 2 diabetes mellitus without complications: Secondary | ICD-10-CM

## 2016-01-29 LAB — POCT GLYCOSYLATED HEMOGLOBIN (HGB A1C): Hemoglobin A1C: 5.6

## 2016-01-29 NOTE — Patient Instructions (Signed)
Please stay off all diabetes meds for now.   Please let me know if the sugars are consistently >120 before a meal and >150 after a meal.  Please come back for a follow-up appointment in 6 months.

## 2016-01-29 NOTE — Progress Notes (Signed)
Patient ID: Shirley [name], female   DOB: [birth year], 35 y.o.   MRN: [id number]  HPI: Shirley [name] is a 35 y.o.-year-old female, returning for f/u for DM2, dx 2005, non-insulin-dependent, uncontrolled, with complications ([facility]). Last visit 3 mo ago.  She had GBP sx (sleeve gastrectomy) on 08/28/2015. She developed hypoglycemia and hypotension after the Sx. She had syncope in the hospital.  Last hemoglobin A1c was:  Lab Results  Component Value Date   HGBA1C 5.9 11/01/2015   HGBA1C 7.5* 08/10/2015   HGBA1C 7.2* 04/07/2015  07/15/2015: 7.1% 07/13/2014: 7.2%  - at work 01/2014: 7.8% 08/2013: 7.2%  Pt is off her DM meds.  Previously: - Januvia 100 mg in am - Metformin XR (osm) 1000 mg tabs 2x a day with meals >> some loose stools - Invokana 100 - added 03/2014 She was on Janumet XR 1000-100 mg po with dinner (used to cut them in half to swallow them better!!!) She was on Metformin in the past >> diarrhea at max dose  Pt checks her sugars 0-3x a day and they are normal: - am: 122-165 (1x 188) >> 120-142 >> n/c >> 124-145 >> 135-140 >> 124-162 >> 88-120, 139, 165x1 >> 90-100 - 2h after b'fast: 98-164, 187 x1>> 115-148, 172 >> 133-144, 194 >> 150s >> 125-141 >> 85 >> 120s - before lunch: n/c >> 111-129 >> 87, 104-135, 184 >> 98-120 >> 104-149, 161 >> n/c - 2h after lunch: 127-199 (1x 213) >> 101-177 >> 144, 153 >> n/c >> 145-160 >> 121-174 >> 87, 100-121, 146 >> n/c - before dinner: 141, 200 >> 122-160, 181 (snack before leaves work) >> 98-120 >> 131-162 >> 100-136 >> n/c - 2h after dinner: n/c  - bedtime: n/c - nighttime: n/c No lows. Lowest sugar was 111 >> 98 >> 85 >> 90;? if  she has hypoglycemia awareness. Highest sugar was 195x1 >> 200 >> 184 >> 200s (steroids) >> 170 >> 165 >> 130s  Pt's meals are: - Breakfast: protein shake - Lunch: leftovers from dinner; sandwich - Dinner: hamburgers; chicken + veggies + starch + cookie or cake on weekends  - no CKD, last  BUN/creatinine normal:  Lab Results  Component Value Date   BUN 11 09/03/2015   CREATININE 0.87 09/03/2015  She is on Losartan. Last ACR: 30.1 in 07/21/2013 - No HL: Lab Results  Component Value Date   CHOL 177 08/10/2015   HDL 59 08/10/2015   LDLCALC 103* 08/10/2015   TRIG 75 08/10/2015  - [facility] No DR.  - no numbness and tingling in her feet. Last foot exam: normal 11/13/2015.  She also has a history of vit D def., HTN, GERD. Had a normal gastric emptying study.   I reviewed pt's medications, allergies, PMH, social hx, family hx, and changes were documented in the history of present illness. Otherwise, unchanged from my initial visit note.  ROS: Constitutional: no weight gain/loss, no fatigue, no subjective hyperthermia/hypothermia Eyes: no blurry vision, no xerophthalmia ENT: no sore throat, no nodules palpated in throat, no dysphagia/odynophagia, no hoarseness Cardiovascular: no CP/SOB/no palpitations/+ leg swelling Respiratory: no cough/SOB Gastrointestinal: no N/V/D/C Musculoskeletal: no muscle/no joint aches Skin: no rashes Neurological: no tremors/numbness/tingling/dizziness  PE: BP 120/80 mmHg  Pulse 80  Temp(Src) 98.4 F (36.9 C) (Oral)  Resp 12  Wt 217 lb (98.431 kg)  SpO2 94% Body mass index is 40.34 kg/(m^2). Wt Readings from Last 3 Encounters:  01/29/16 217 lb (98.431 kg)  11/13/15 224 lb (101.606 kg)  11/01/15 231 lb 9.6 oz (105.053 kg)   Constitutional: obese, in NAD Eyes: PERRLA, EOMI, no exophthalmos ENT: moist mucous membranes, no thyromegaly, no cervical lymphadenopathy Cardiovascular: RRR, No MRG Respiratory: CTA B Gastrointestinal: abdomen soft, NT, ND, BS+ Musculoskeletal: no deformities, strength intact in all 4 Skin: moist, warm, no rashes, acanthosis nigricans on neck; hirsutism face, chest Neurological: no tremor with outstretched hands, DTR normal in all 4   ASSESSMENT: 1. DM2,  non-insulin-dependent, uncontrolled, without complications - [facility]  PLAN:  1. Patient with long-standing, now resolved diabetes, off all her DM meds after her gastric sleeve procedure. Sugars are at goal. Will let her stay off her meds >> see her back in 6 mo.  Patient Instructions  Please stay off all diabetes meds for now.   Please let me know if the sugars are consistently >120 before a meal and >150 after a meal.  Please come back for a follow-up appointment in 6 months.  - continue checking sugars at different times of the day - check 0-1 times a day, rotating checks  - UTD with eye exams - checked HbA1C >> 5.6% (decreased) - Return to clinic in 6 mo.

## 2016-02-11 ENCOUNTER — Ambulatory Visit: Payer: 59 | Admitting: Internal Medicine

## 2016-02-11 ENCOUNTER — Ambulatory Visit (INDEPENDENT_AMBULATORY_CARE_PROVIDER_SITE_OTHER): Payer: 59 | Admitting: Internal Medicine

## 2016-02-11 ENCOUNTER — Encounter: Payer: Self-pay | Admitting: Internal Medicine

## 2016-02-11 VITALS — BP 112/80 | HR 85 | Temp 98.4°F | Resp 18 | Ht 61.5 in | Wt 218.2 lb

## 2016-02-11 DIAGNOSIS — E119 Type 2 diabetes mellitus without complications: Secondary | ICD-10-CM

## 2016-02-11 DIAGNOSIS — G479 Sleep disorder, unspecified: Secondary | ICD-10-CM

## 2016-02-11 DIAGNOSIS — R809 Proteinuria, unspecified: Secondary | ICD-10-CM

## 2016-02-11 DIAGNOSIS — Z658 Other specified problems related to psychosocial circumstances: Secondary | ICD-10-CM

## 2016-02-11 DIAGNOSIS — I10 Essential (primary) hypertension: Secondary | ICD-10-CM

## 2016-02-11 DIAGNOSIS — E559 Vitamin D deficiency, unspecified: Secondary | ICD-10-CM

## 2016-02-11 DIAGNOSIS — Z91048 Other nonmedicinal substance allergy status: Secondary | ICD-10-CM

## 2016-02-11 DIAGNOSIS — Z9109 Other allergy status, other than to drugs and biological substances: Secondary | ICD-10-CM

## 2016-02-11 DIAGNOSIS — Z1239 Encounter for other screening for malignant neoplasm of breast: Secondary | ICD-10-CM | POA: Diagnosis not present

## 2016-02-11 DIAGNOSIS — IMO0001 Reserved for inherently not codable concepts without codable children: Secondary | ICD-10-CM

## 2016-02-11 DIAGNOSIS — F439 Reaction to severe stress, unspecified: Secondary | ICD-10-CM

## 2016-02-11 NOTE — Progress Notes (Signed)
Patient ID: Shirley [name], female   DOB: June 14, 1981, 35 y.o.   MRN: [id number]   Subjective:    Patient ID: Shirley [name], female    DOB: March 15, 1981, 35 y.o.   MRN: [id number]  HPI  Patient here for a scheduled follow up.   She is s/p gastric surgery (sleeve gastrectomy).  Has lost weight.  Sugars much improved.  Off her blood pressure and her diabetic medications.  Last a1c 5.6.  Feels better.  Moving around easier.  Able to walk faster.  Has not started formal exercise.  Does try to stay active.  Watching her diet.  She does report she is not sleeping well.  Has trouble falling asleep.  Trouble staying asleep.  Wakes up frequently.   Does snore.  States even if she does sleep through the night, she does not feel rested.  Discussed sleep apnea.  Discussed sleep study.  She is in agreement.  No nausea or vomiting.  Bowels stable.  No trouble swallowing.     Past Medical History  Diagnosis Date  . Depression   . Diabetes mellitus (HCC)   . Migraine   . Hypertension   . IBS (irritable bowel syndrome)   . GERD (gastroesophageal reflux disease)   . Anemia   . Complication of anesthesia     itching after surgery 2005, 2006  . PONV (postoperative nausea and vomiting)     nausea  . Asthma   . Fatty liver    Past Surgical History  Procedure Laterality Date  . Cholecystectomy  2005  . 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 Laparoscopic gastric restrictive duodenal procedure (duodenal switch) N/A 08/28/2015    Procedure: LAPAROSCOPIC GASTRIC RESTRICTIVE DUODENAL PROCEDURE (DUODENAL SWITCH);  Surgeon: [name], MD;  Location: [facility] ORS;  Service: General;  Laterality: N/A;   Family History  Problem Relation Age of Onset  . Hypertension Mother   . Diabetes Mother   . Asthma Brother   . Breast cancer Neg Hx   . Colon cancer Neg Hx    Social History   Social History  . Marital Status: Single    Spouse Name: N/A  . Number of Children: 0  . Years of Education: N/A    Occupational History  .     Social History Main Topics  . Smoking status: Never Smoker   . Smokeless tobacco: Never Used  . Alcohol Use: No  . Drug Use: No  . Sexual Activity: Not Asked   Other Topics Concern  . None   Social History Narrative    Outpatient Encounter Prescriptions as of 02/11/2016  Medication Sig  . albuterol (PROVENTIL HFA;VENTOLIN HFA) 108 (90 BASE) MCG/ACT inhaler Inhale into the lungs every 6 (six) hours as needed for wheezing or shortness of breath.  Marland Kitchen azelastine (OPTIVAR) 0.05 % ophthalmic solution 1 drop as needed.   . beclomethasone (QVAR) 40 MCG/ACT inhaler Inhale 1 puff into the lungs as needed.   Marland Kitchen glucose blood (BAYER CONTOUR NEXT TEST) test strip Use 1 strip two times daily.  Marland Kitchen levocetirizine (XYZAL) 5 MG tablet Take 5 mg by mouth every evening.  . Olopatadine HCl (PATANASE) 0.6 % SOLN Place into the nose as needed.   No facility-administered encounter medications on file as of 02/11/2016.    Review of Systems  Constitutional: Negative for fever.       Adjusting her diet.  Has lost weight.    HENT: Negative for congestion and sinus  pressure.   Respiratory: Negative for cough, chest tightness and shortness of breath.   Cardiovascular: Negative for chest pain, palpitations and leg swelling.  Gastrointestinal: Negative for nausea, vomiting, abdominal pain and diarrhea.  Genitourinary: Negative for dysuria and difficulty urinating.  Musculoskeletal: Negative for back pain and joint swelling.  Skin: Negative for color change and rash.  Neurological: Negative for dizziness, light-headedness and headaches.  Psychiatric/Behavioral: Positive for sleep disturbance. Negative for dysphoric mood and agitation.       Objective:    Physical Exam  Constitutional: She appears well-developed and well-nourished. No distress.  HENT:  Nose: Nose normal.  Mouth/Throat: Oropharynx is clear and moist.  Neck: Neck supple. No thyromegaly present.   Cardiovascular: Normal rate and regular rhythm.   Pulmonary/Chest: Breath sounds normal. No respiratory distress. She has no wheezes.  Abdominal: Soft. Bowel sounds are normal. There is no tenderness.  Musculoskeletal: She exhibits no edema or tenderness.  Lymphadenopathy:    She has no cervical adenopathy.  Skin: No rash noted. No erythema.  Psychiatric: She has a normal mood and affect. Her behavior is normal.    BP 112/80 mmHg  Pulse 85  Temp(Src) 98.4 F (36.9 C) (Oral)  Resp 18  Ht 5' 1.5" (1.562 m)  Wt 218 lb 4 oz (98.998 kg)  BMI 40.58 kg/m2  SpO2 98% Wt Readings from Last 3 Encounters:  02/11/16 218 lb 4 oz (98.998 kg)  01/29/16 217 lb (98.431 kg)  11/13/15 224 lb (101.606 kg)     Lab Results  Component Value Date   WBC 6.9 09/02/2015   HGB 12.1 09/02/2015   HCT 38.6 09/02/2015   PLT 202 09/02/2015   GLUCOSE 192* 09/03/2015   CHOL 177 08/10/2015   TRIG 75 08/10/2015   HDL 59 08/10/2015   LDLCALC 103* 08/10/2015   ALT 273* 08/29/2015   AST 305* 08/29/2015   NA 146* 09/03/2015   K 3.5 09/03/2015   CL 117* 09/03/2015   CREATININE 0.87 09/03/2015   BUN 11 09/03/2015   CO2 23 09/03/2015   TSH 1.120 08/10/2015   HGBA1C 5.6 01/29/2016       Assessment & Plan:   Problem List Items Addressed This Visit    Environmental allergies    Followed by Dr Buena CallasSharma.  Continue current regimen.  Stable.        Hypertension    Off medication now.  Blood pressure doing well.  Follow.       Severe obesity (BMI >= 40) (HCC)    She has adjusted diet.  S/p gastric surgery.  Lost weight.  Follow.        Sleep difficulties - Primary    Wakes up frequently.  Does not feel rested when does sleep through the night.  Increased daytime somnolence.  Snores.  Check split night sleep study.  Hold on sleep medication until can review sleep study.        Relevant Orders   Ambulatory referral to Sleep Studies   Stress    She does report increased stress.  Discussed with her  today.  Will obtain split night sleep study and see if can get her sleeping better.  States has good support.  Does not feel need any further intervention at this time.  Follow.        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  S/p gastric surgery.  Lost weight.  Off medication.  Last a1c 5.6.  Doing well.  Follow.  Discussed diet and exercise.        Vitamin D deficiency    Follow vitamin D level.         Other Visit Diagnoses    Breast cancer screening        Relevant Orders    MM DIGITAL SCREENING BILATERAL        Dale Onida, MD

## 2016-02-11 NOTE — Progress Notes (Signed)
Pre-visit discussion using our clinic review tool. No additional management support is needed unless otherwise documented below in the visit note.  

## 2016-02-12 ENCOUNTER — Encounter: Payer: Self-pay | Admitting: Internal Medicine

## 2016-02-12 DIAGNOSIS — G479 Sleep disorder, unspecified: Secondary | ICD-10-CM | POA: Insufficient documentation

## 2016-02-12 NOTE — Assessment & Plan Note (Signed)
Wakes up frequently.  Does not feel rested when does sleep through the night.  Increased daytime somnolence.  Snores.  Check split night sleep study.  Hold on sleep medication until can review sleep study.

## 2016-02-12 NOTE — Assessment & Plan Note (Signed)
Followed by Dr Theodosia CallasSharma.  Continue current regimen.  Stable.

## 2016-02-12 NOTE — Assessment & Plan Note (Signed)
Follow vitamin D level.  

## 2016-02-12 NOTE — Assessment & Plan Note (Signed)
Has adjusted diet.  S/p gastric surgery.  Lost weight.  Off medication.  Last a1c 5.6.  Doing well.  Follow.  Discussed diet and exercise.

## 2016-02-12 NOTE — Assessment & Plan Note (Signed)
She has adjusted diet.  S/p gastric surgery.  Lost weight.  Follow.

## 2016-02-12 NOTE — Assessment & Plan Note (Signed)
She does report increased stress.  Discussed with her today.  Will obtain split night sleep study and see if can get her sleeping better.  States has good support.  Does not feel need any further intervention at this time.  Follow.

## 2016-02-12 NOTE — Assessment & Plan Note (Signed)
Off medication now.  Blood pressure doing well.  Follow.

## 2016-02-18 ENCOUNTER — Ambulatory Visit
Admission: RE | Admit: 2016-02-18 | Discharge: 2016-02-18 | Disposition: A | Discharge status: Home / Self Care | Payer: 59 | Source: Ambulatory Visit | Attending: Internal Medicine | Admitting: Internal Medicine

## 2016-02-18 DIAGNOSIS — Z1231 Encounter for screening mammogram for malignant neoplasm of breast: Secondary | ICD-10-CM | POA: Diagnosis not present

## 2016-02-18 DIAGNOSIS — Z1239 Encounter for other screening for malignant neoplasm of breast: Secondary | ICD-10-CM

## 2016-02-20 ENCOUNTER — Other Ambulatory Visit: Payer: Self-pay | Admitting: Internal Medicine

## 2016-02-20 DIAGNOSIS — R928 Other abnormal and inconclusive findings on diagnostic imaging of breast: Secondary | ICD-10-CM

## 2016-02-22 ENCOUNTER — Other Ambulatory Visit: Payer: Self-pay | Admitting: Internal Medicine

## 2016-02-22 ENCOUNTER — Encounter: Payer: Self-pay | Admitting: Internal Medicine

## 2016-02-22 DIAGNOSIS — R928 Other abnormal and inconclusive findings on diagnostic imaging of breast: Secondary | ICD-10-CM

## 2016-02-22 NOTE — Progress Notes (Signed)
Order placed for bilateral mammogram (diagnostic) with possible ultrasound (bilateral)

## 2016-02-25 MED ORDER — LORAZEPAM 0.5 MG PO TABS: ORAL_TABLET | ORAL | Status: DC

## 2016-02-25 NOTE — Telephone Encounter (Signed)
Per my chart message, rx ok'd for lorazepam #15 with no refills.  rx printed and placed in your box.  Pt notified via my chart.

## 2016-03-05 ENCOUNTER — Telehealth: Payer: Self-pay | Admitting: Internal Medicine

## 2016-03-05 NOTE — Telephone Encounter (Signed)
Left msg to call office/msn °

## 2016-03-11 ENCOUNTER — Encounter: Payer: Self-pay | Admitting: Internal Medicine

## 2016-03-21 ENCOUNTER — Other Ambulatory Visit: Payer: Self-pay | Admitting: Internal Medicine

## 2016-03-22 LAB — CBC WITH DIFFERENTIAL/PLATELET
Basophils Absolute: 0 10*3/uL (ref 0.0–0.2)
Basos: 0 %
EOS (ABSOLUTE): 0.1 10*3/uL (ref 0.0–0.4)
Eos: 1 %
Hematocrit: 39.3 % (ref 34.0–46.6)
Hemoglobin: 12.3 g/dL (ref 11.1–15.9)
Immature Grans (Abs): 0 10*3/uL (ref 0.0–0.1)
Immature Granulocytes: 0 %
Lymphocytes Absolute: 2.5 10*3/uL (ref 0.7–3.1)
Lymphs: 49 %
MCH: 23.3 pg — ABNORMAL LOW (ref 26.6–33.0)
MCHC: 31.3 g/dL — ABNORMAL LOW (ref 31.5–35.7)
MCV: 74 fL — ABNORMAL LOW (ref 79–97)
Monocytes Absolute: 0.4 10*3/uL (ref 0.1–0.9)
Monocytes: 8 %
Neutrophils Absolute: 2.2 10*3/uL (ref 1.4–7.0)
Neutrophils: 42 %
Platelets: 238 10*3/uL (ref 150–379)
RBC: 5.29 x10E6/uL — ABNORMAL HIGH (ref 3.77–5.28)
RDW: 14.1 % (ref 12.3–15.4)
WBC: 5.2 10*3/uL (ref 3.4–10.8)

## 2016-03-22 LAB — BASIC METABOLIC PANEL
BUN/Creatinine Ratio: 15 (ref 9–23)
BUN: 12 mg/dL (ref 6–20)
CO2: 22 mmol/L (ref 18–29)
Calcium: 9.5 mg/dL (ref 8.7–10.2)
Chloride: 99 mmol/L (ref 96–106)
Creatinine, Ser: 0.78 mg/dL (ref 0.57–1.00)
GFR calc Af Amer: 113 mL/min/{1.73_m2} (ref >59)
GFR calc non Af Amer: 98 mL/min/{1.73_m2} (ref >59)
Glucose: 102 mg/dL — ABNORMAL HIGH (ref 65–99)
Potassium: 4.4 mmol/L (ref 3.5–5.2)
Sodium: 138 mmol/L (ref 134–144)

## 2016-03-22 LAB — HEPATIC FUNCTION PANEL
ALT: 25 IU/L (ref 0–32)
AST: 21 IU/L (ref 0–40)
Albumin: 4.1 g/dL (ref 3.5–5.5)
Alkaline Phosphatase: 142 IU/L — ABNORMAL HIGH (ref 39–117)
Bilirubin Total: 1.1 mg/dL (ref 0.0–1.2)
Bilirubin, Direct: 0.28 mg/dL (ref 0.00–0.40)
Total Protein: 6.8 g/dL (ref 6.0–8.5)

## 2016-03-22 LAB — LIPID PANEL WITH LDL/HDL RATIO
Cholesterol, Total: 118 mg/dL (ref 100–199)
HDL: 62 mg/dL (ref >39)
LDL Calculated: 47 mg/dL (ref 0–99)
LDl/HDL Ratio: 0.8 ratio units (ref 0.0–3.2)
Triglycerides: 47 mg/dL (ref 0–149)
VLDL Cholesterol Cal: 9 mg/dL (ref 5–40)

## 2016-03-22 LAB — VITAMIN D 25 HYDROXY (VIT D DEFICIENCY, FRACTURES): Vit D, 25-Hydroxy: 17.7 ng/mL — ABNORMAL LOW (ref 30.0–100.0)

## 2016-03-25 ENCOUNTER — Telehealth: Payer: Self-pay | Admitting: *Deleted

## 2016-03-25 ENCOUNTER — Ambulatory Visit
Admission: RE | Admit: 2016-03-25 | Discharge: 2016-03-25 | Disposition: A | Discharge status: Home / Self Care | Payer: 59 | Source: Ambulatory Visit | Attending: Internal Medicine | Admitting: Internal Medicine

## 2016-03-25 ENCOUNTER — Other Ambulatory Visit: Payer: Self-pay | Admitting: Internal Medicine

## 2016-03-25 ENCOUNTER — Encounter: Payer: Self-pay | Admitting: *Deleted

## 2016-03-25 DIAGNOSIS — R928 Other abnormal and inconclusive findings on diagnostic imaging of breast: Secondary | ICD-10-CM

## 2016-03-25 DIAGNOSIS — R748 Abnormal levels of other serum enzymes: Secondary | ICD-10-CM

## 2016-03-25 DIAGNOSIS — R921 Mammographic calcification found on diagnostic imaging of breast: Secondary | ICD-10-CM | POA: Insufficient documentation

## 2016-03-25 MED ORDER — VITAMIN D (ERGOCALCIFEROL) 1.25 MG (50000 UNIT) PO CAPS: 50000.0000 [IU] | ORAL_CAPSULE | ORAL | Status: DC

## 2016-03-25 NOTE — Progress Notes (Signed)
Order placed for f/u alkaline phos level

## 2016-03-26 ENCOUNTER — Encounter: Payer: Self-pay | Admitting: Internal Medicine

## 2016-03-26 NOTE — Telephone Encounter (Signed)
Order form placed in your box.   

## 2016-04-11 ENCOUNTER — Encounter: Payer: Self-pay | Admitting: Internal Medicine

## 2016-04-11 NOTE — Telephone Encounter (Signed)
Please call pt and make sure she is aware that I am out of the office today and beginning of next week.  See if she is ok with waiting to have this addressed when I return.  If needs something more acute, can go to acute care and then can f/u after with me.  Let me know if any problems.   If able to wait and no problems with it, then forward to [place] to work in and I will get with her about work in appt.  Thanks

## 2016-04-21 ENCOUNTER — Telehealth: Payer: Self-pay | Admitting: Internal Medicine

## 2016-04-21 NOTE — Telephone Encounter (Signed)
Pt notified of sleep study results and asked if needs f/u appt earlier.

## 2016-04-22 ENCOUNTER — Other Ambulatory Visit: Payer: 59

## 2016-04-23 ENCOUNTER — Other Ambulatory Visit: Payer: Self-pay | Admitting: Internal Medicine

## 2016-04-24 LAB — ALKALINE PHOSPHATASE: Alkaline Phosphatase: 139 IU/L — ABNORMAL HIGH (ref 39–117)

## 2016-05-01 ENCOUNTER — Ambulatory Visit (INDEPENDENT_AMBULATORY_CARE_PROVIDER_SITE_OTHER): Payer: 59 | Admitting: Family Medicine

## 2016-05-01 ENCOUNTER — Encounter: Payer: Self-pay | Admitting: Family Medicine

## 2016-05-01 ENCOUNTER — Other Ambulatory Visit: Payer: Self-pay | Admitting: Family Medicine

## 2016-05-01 VITALS — BP 120/82 | HR 76 | Temp 98.8°F | Ht 61.5 in | Wt 215.2 lb

## 2016-05-01 DIAGNOSIS — R5382 Chronic fatigue, unspecified: Secondary | ICD-10-CM

## 2016-05-01 DIAGNOSIS — R5383 Other fatigue: Secondary | ICD-10-CM | POA: Insufficient documentation

## 2016-05-01 NOTE — Progress Notes (Signed)
   Subjective:  Patient ID: Shirley [name], female    DOB: October 20, 1981  Age: 35 y.o. MRN: [id number]  CC: Fatigue  HPI:  35 year old female who is status post duodenal switch presents with complaints of fatigue.  Patient states that she's not been feeling well since March. She states that she been experiencing significant fatigue. She's also had frequent headaches.Also, she's had an episode of dizziness as well. She states that her headaches have now resolved and improved with Tylenol. Dizziness has also now resolved. However, she continues to have severe fatigue. She notes that she's been under stress as of late. No other known inciting or exacerbating factors. No no relieving factors. Of note, patient has not had routine labs drawn regarding her bariatric surgery (patient reports that she had complications from bariatric surgery as well).  Social Hx   Social History   Social History  . Marital Status: Single    Spouse Name: N/A  . Number of Children: 0  . Years of Education: N/A   Occupational History  .     Social History Main Topics  . Smoking status: Never Smoker   . Smokeless tobacco: Never Used  . Alcohol Use: No  . Drug Use: No  . Sexual Activity: Not Asked   Other Topics Concern  . None   Social History Narrative   Review of Systems  Constitutional: Positive for fatigue.  Neurological: Positive for headaches.   Objective:  BP 120/82 mmHg  Pulse 76  Temp(Src) 98.8 F (37.1 C) (Oral)  Ht 5' 1.5" (1.562 m)  Wt 215 lb 4 oz (97.637 kg)  BMI 40.02 kg/m2  SpO2 98%  BP/Weight 05/01/2016 02/11/2016 01/29/2016  Systolic BP 120 112 120  Diastolic BP 82 80 80  Wt. (Lbs) 215.25 218.25 217  BMI 40.02 40.58 40.34   Physical Exam  Constitutional: She is oriented to person, place, and time. She appears well-developed. No distress.  Eyes: Conjunctivae are normal. No scleral icterus.  Neck: Neck supple.  Cardiovascular: Normal rate and regular rhythm.   Pulmonary/Chest:  Effort normal. She has no wheezes. She has no rales.  Neurological: She is alert and oriented to person, place, and time.  Psychiatric: She has a normal mood and affect.  Vitals reviewed.  Lab Results  Component Value Date   WBC 5.2 03/21/2016   HGB 12.1 09/02/2015   HCT 39.3 03/21/2016   PLT 238 03/21/2016   GLUCOSE 102* 03/21/2016   CHOL 118 03/21/2016   TRIG 47 03/21/2016   HDL 62 03/21/2016   LDLCALC 47 03/21/2016   ALT 25 03/21/2016   AST 21 03/21/2016   NA 138 03/21/2016   K 4.4 03/21/2016   CL 99 03/21/2016   CREATININE 0.78 03/21/2016   BUN 12 03/21/2016   CO2 22 03/21/2016   TSH 1.120 08/10/2015   HGBA1C 5.6 01/29/2016   Assessment & Plan:   Problem List Items Addressed This Visit    Fatigue - Primary    New problem. Unclear etiology/prognosis. However suspect that this is secondary to deficiencies related to bariatric surgery. Obtaining labs today - CBC, CMP, Iron studies, Vitamin B12, Vitamin D, Folate, PTH (per guidelines). Patient to follow up closely with PCP.        Follow-up: PRN  Everlene OtherJayce Karolyne Timmons DO [facility] [place]

## 2016-05-01 NOTE — Progress Notes (Signed)
Pre visit review using our clinic review tool, if applicable. No additional management support is needed unless otherwise documented below in the visit note. 

## 2016-05-01 NOTE — Assessment & Plan Note (Signed)
New problem. Unclear etiology/prognosis. However suspect that this is secondary to deficiencies related to bariatric surgery. Obtaining labs today - CBC, CMP, Iron studies, Vitamin B12, Vitamin D, Folate, PTH (per guidelines). Patient to follow up closely with PCP.

## 2016-05-02 LAB — CBC
Hematocrit: 40.1 % (ref 34.0–46.6)
Hemoglobin: 12.1 g/dL (ref 11.1–15.9)
MCH: 22.6 pg — ABNORMAL LOW (ref 26.6–33.0)
MCHC: 30.2 g/dL — ABNORMAL LOW (ref 31.5–35.7)
MCV: 75 fL — ABNORMAL LOW (ref 79–97)
Platelets: 198 10*3/uL (ref 150–379)
RBC: 5.36 x10E6/uL — ABNORMAL HIGH (ref 3.77–5.28)
RDW: 14.8 % (ref 12.3–15.4)
WBC: 5 10*3/uL (ref 3.4–10.8)

## 2016-05-02 LAB — COMPREHENSIVE METABOLIC PANEL
ALT: 33 IU/L — ABNORMAL HIGH (ref 0–32)
AST: 25 IU/L (ref 0–40)
Albumin/Globulin Ratio: 1.4 (ref 1.2–2.2)
Albumin: 4 g/dL (ref 3.5–5.5)
Alkaline Phosphatase: 144 IU/L — ABNORMAL HIGH (ref 39–117)
BUN/Creatinine Ratio: 16 (ref 9–23)
BUN: 10 mg/dL (ref 6–20)
Bilirubin Total: 1 mg/dL (ref 0.0–1.2)
CO2: 22 mmol/L (ref 18–29)
Calcium: 9 mg/dL (ref 8.7–10.2)
Chloride: 102 mmol/L (ref 96–106)
Creatinine, Ser: 0.63 mg/dL (ref 0.57–1.00)
GFR calc Af Amer: 133 mL/min/{1.73_m2} (ref >59)
GFR calc non Af Amer: 116 mL/min/{1.73_m2} (ref >59)
Globulin, Total: 2.8 g/dL (ref 1.5–4.5)
Glucose: 98 mg/dL (ref 65–99)
Potassium: 4.5 mmol/L (ref 3.5–5.2)
Sodium: 141 mmol/L (ref 134–144)
Total Protein: 6.8 g/dL (ref 6.0–8.5)

## 2016-05-02 LAB — LIPID PANEL W/O CHOL/HDL RATIO
Cholesterol, Total: 129 mg/dL (ref 100–199)
HDL: 62 mg/dL (ref >39)
LDL Calculated: 54 mg/dL (ref 0–99)
Triglycerides: 67 mg/dL (ref 0–149)
VLDL Cholesterol Cal: 13 mg/dL (ref 5–40)

## 2016-05-02 LAB — IRON AND TIBC
Iron Saturation: 45 % (ref 15–55)
Iron: 158 ug/dL (ref 27–159)
Total Iron Binding Capacity: 348 ug/dL (ref 250–450)
UIBC: 190 ug/dL (ref 131–425)

## 2016-05-02 LAB — FERRITIN: Ferritin: 59 ng/mL (ref 15–150)

## 2016-05-02 LAB — PARATHYROID HORMONE, INTACT (NO CA): PTH: 27 pg/mL (ref 15–65)

## 2016-05-02 LAB — VITAMIN D 25 HYDROXY (VIT D DEFICIENCY, FRACTURES): Vit D, 25-Hydroxy: 19.4 ng/mL — ABNORMAL LOW (ref 30.0–100.0)

## 2016-05-02 LAB — B12 AND FOLATE PANEL
Folate: 13.1 ng/mL (ref >3.0)
Vitamin B-12: 1344 pg/mL — ABNORMAL HIGH (ref 211–946)

## 2016-05-05 ENCOUNTER — Encounter: Payer: Self-pay | Admitting: Internal Medicine

## 2016-05-05 ENCOUNTER — Encounter: Payer: Self-pay | Admitting: Family Medicine

## 2016-05-29 ENCOUNTER — Ambulatory Visit (INDEPENDENT_AMBULATORY_CARE_PROVIDER_SITE_OTHER): Payer: 59 | Admitting: Internal Medicine

## 2016-05-29 ENCOUNTER — Encounter: Payer: Self-pay | Admitting: Internal Medicine

## 2016-05-29 VITALS — BP 100/69 | HR 81 | Temp 98.7°F | Resp 18 | Ht 61.5 in | Wt 217.0 lb

## 2016-05-29 DIAGNOSIS — IMO0001 Reserved for inherently not codable concepts without codable children: Secondary | ICD-10-CM

## 2016-05-29 DIAGNOSIS — E119 Type 2 diabetes mellitus without complications: Secondary | ICD-10-CM | POA: Diagnosis not present

## 2016-05-29 DIAGNOSIS — E559 Vitamin D deficiency, unspecified: Secondary | ICD-10-CM | POA: Diagnosis not present

## 2016-05-29 DIAGNOSIS — Z658 Other specified problems related to psychosocial circumstances: Secondary | ICD-10-CM

## 2016-05-29 DIAGNOSIS — R809 Proteinuria, unspecified: Secondary | ICD-10-CM

## 2016-05-29 DIAGNOSIS — I10 Essential (primary) hypertension: Secondary | ICD-10-CM | POA: Diagnosis not present

## 2016-05-29 DIAGNOSIS — F439 Reaction to severe stress, unspecified: Secondary | ICD-10-CM

## 2016-05-29 DIAGNOSIS — Z9884 Bariatric surgery status: Secondary | ICD-10-CM

## 2016-05-29 DIAGNOSIS — G479 Sleep disorder, unspecified: Secondary | ICD-10-CM

## 2016-05-29 DIAGNOSIS — R42 Dizziness and giddiness: Secondary | ICD-10-CM

## 2016-05-29 MED ORDER — CITALOPRAM HYDROBROMIDE 10 MG PO TABS: 10.0000 mg | ORAL_TABLET | Freq: Every day | ORAL | Status: DC

## 2016-05-29 NOTE — Progress Notes (Signed)
Patient ID: Shirley [name], female   DOB: 1981-08-26, 35 y.o.   MRN: [id number]   Subjective:    Patient ID: Shirley [name], female    DOB: [birth year], 35 y.o.   MRN: [id number]  HPI  Patient here for a scheduled follow up.  She reports not sleeping as well lately.  Increased stress at work.  She has had a couple of episodes of feeling dizzy.  One episode occurred after not eating.  Did not sleep as much.  Woke up and felt queezy.  Felt like was in a fog.  Blood sugar when she took her sugar was 147.  Had taken some cough syrurp prior to going to sleep.  The other episode occurred when had not eaten.  Was fine lying down.  Ate and drank something.  Ok.  No chest pain.  Had the bariatric surgery.  Has lost weight.  No abdominal pain or cramping.  No sob.  No bowel change.  Off her diabetic medication and her blood pressure medication.  Discussed importance of staying hydrated and eating regular meals including protein snacks.  Increased anxiety.  Wants to see psychiatry.     Past Medical History  Diagnosis Date  . Depression   . Diabetes mellitus ([place])   . Migraine   . Hypertension   . IBS (irritable bowel syndrome)   . GERD (gastroesophageal reflux disease)   . Anemia   . Complication of anesthesia     itching after surgery 2005, 2006  . PONV (postoperative nausea and vomiting)     nausea  . Asthma   . Fatty liver    Past Surgical History  Procedure Laterality Date  . Cholecystectomy  2005  . 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 Laparoscopic gastric restrictive duodenal procedure (duodenal switch) N/A 08/28/2015    Procedure: LAPAROSCOPIC GASTRIC RESTRICTIVE DUODENAL PROCEDURE (DUODENAL SWITCH);  Surgeon: Ladora Daniel, MD;  Location: [facility] ORS;  Service: General;  Laterality: N/A;   Family History  Problem Relation Age of Onset  . Hypertension Mother   . Diabetes Mother   . Asthma Brother   . Breast cancer Neg Hx   . Colon cancer Neg Hx    Social History   Social  History  . Marital Status: Single    Spouse Name: N/A  . Number of Children: 0  . Years of Education: N/A   Occupational History  .     Social History Main Topics  . Smoking status: Never Smoker   . Smokeless tobacco: Never Used  . Alcohol Use: No  . Drug Use: No  . Sexual Activity: Not Asked   Other Topics Concern  . None   Social History Narrative    Outpatient Encounter Prescriptions as of 05/29/2016  Medication Sig  . albuterol (PROVENTIL HFA;VENTOLIN HFA) 108 (90 BASE) MCG/ACT inhaler Inhale into the lungs every 6 (six) hours as needed for wheezing or shortness of breath. Reported on 05/01/2016  . azelastine (OPTIVAR) 0.05 % ophthalmic solution 1 drop as needed. Reported on 05/01/2016  . beclomethasone (QVAR) 40 MCG/ACT inhaler Inhale 1 puff into the lungs as needed. Reported on 05/01/2016  . levocetirizine (XYZAL) 5 MG tablet Take 5 mg by mouth every evening. Reported on 05/01/2016  . Olopatadine HCl (PATANASE) 0.6 % SOLN Place into the nose as needed. Reported on 05/01/2016  . citalopram (CELEXA) 10 MG tablet Take 1 tablet (10 mg total) by mouth daily.  Marland Kitchen glucose blood (BAYER CONTOUR  NEXT TEST) test strip Use 1 strip two times daily. (Patient not taking: Reported on 05/01/2016)  . LORazepam (ATIVAN) 0.5 MG tablet Take 1/2 to one tablet q day prn (Patient not taking: Reported on 05/01/2016)  . Vitamin D, Ergocalciferol, (DRISDOL) 50000 units CAPS capsule Take 1 capsule (50,000 Units total) by mouth every 7 (seven) days. (Patient not taking: Reported on 05/01/2016)   No facility-administered encounter medications on file as of 05/29/2016.    Review of Systems  Constitutional: Negative for appetite change and unexpected weight change.  HENT: Negative for congestion and sinus pressure.   Respiratory: Negative for cough, chest tightness and shortness of breath.   Cardiovascular: Negative for chest pain, palpitations and leg swelling.  Gastrointestinal: Negative for nausea, vomiting, abdominal  pain and diarrhea.  Genitourinary: Negative for dysuria and difficulty urinating.  Musculoskeletal: Negative for back pain and joint swelling.  Skin: Negative for color change and rash.  Neurological: Positive for dizziness and light-headedness. Negative for headaches.  Psychiatric/Behavioral: Negative for dysphoric mood and agitation.       Increased stress and anxiety.         Objective:    Physical Exam  Constitutional: She appears well-developed and well-nourished.  HENT:  Nose: Nose normal.  Mouth/Throat: Oropharynx is clear and moist.  Neck: Neck supple. No thyromegaly present.  Cardiovascular: Normal rate and regular rhythm.   Pulmonary/Chest: Breath sounds normal. No respiratory distress. She has no wheezes.  Abdominal: Soft. Bowel sounds are normal. There is no tenderness.  Musculoskeletal: She exhibits no edema or tenderness.  Lymphadenopathy:    She has no cervical adenopathy.  Skin: No rash noted. No erythema.  Psychiatric: She has a normal mood and affect. Her behavior is normal.    BP 100/69 mmHg  Pulse 81  Temp(Src) 98.7 F (37.1 C) (Oral)  Resp 18  Ht 5' 1.5" (1.562 m)  Wt 217 lb (98.431 kg)  BMI 40.34 kg/m2  SpO2 100% Wt Readings from Last 3 Encounters:  05/29/16 217 lb (98.431 kg)  05/01/16 215 lb 4 oz (97.637 kg)  02/11/16 218 lb 4 oz (98.998 kg)     Lab Results  Component Value Date   WBC 5.0 05/01/2016   HGB 12.1 09/02/2015   HCT 40.1 05/01/2016   PLT 198 05/01/2016   GLUCOSE 98 05/01/2016   CHOL 129 05/01/2016   TRIG 67 05/01/2016   HDL 62 05/01/2016   LDLCALC 54 05/01/2016   ALT 33* 05/01/2016   AST 25 05/01/2016   NA 141 05/01/2016   K 4.5 05/01/2016   CL 102 05/01/2016   CREATININE 0.63 05/01/2016   BUN 10 05/01/2016   CO2 22 05/01/2016   TSH 1.120 08/10/2015   HGBA1C 5.6 01/29/2016    Mm Digital Diagnostic Bilat  03/25/2016  CLINICAL DATA:  Recall from baseline screening mammogram. 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 EXAM: 2D DIGITAL DIAGNOSTIC BILATERAL MAMMOGRAM WITH CAD AND ADJUNCT TOMO COMPARISON:  02/18/2016 baseline mammogram ACR Breast Density Category b: There are scattered areas of fibroglandular density. FINDINGS: Additional views of the axillary portions of the breasts demonstrate bilateral skin calcifications which are associated with the patient's bilateral localized inflammatory dermal changes. There are no findings worrisome for malignancy. Mammographic images were processed with CAD. IMPRESSION: The calcifications within the axillary regions are dermal. No findings worrisome for malignancy. RECOMMENDATION: Bilateral screening mammography at age 49 or earlier felt to  be clinically indicated. I have discussed the findings and recommendations with the patient. Results were also provided in writing at the conclusion of the visit. If applicable, a reminder letter will be sent to the patient regarding the next appointment. BI-RADS CATEGORY  2: Benign. Electronically Signed   By: Altamese Cabal M.D.   On: 03/25/2016 15:29       Assessment & Plan:   Problem List Items Addressed This Visit    Bariatric surgery status    S/p surgery.  Replace vitamin D.  Follow.       Dizziness    No persistent dizziness.  Had the episodes as outlined.  Both seem to have occurred with decreased sleep and when had not eaten in a while.  Discussed importance of staying hydrated and eating regular meals and appropriate snacks.  Follow.        Hypertension    On no blood pressure medication now.  Pressure ok.  Stay hydrated.  Follow.       Sleep difficulties    Had recent split night sleep study.  Negative for obstructive sleep apnea.  Problems with her mind not shutting down.  Start citalopram.  Refer to psych.  Follow.        Relevant Orders   Ambulatory referral to Psychiatry   Stress - Primary     Increased stress and anxiety as outlined.  Discussed with her today.  Not sleeping well.  Mind will not shut down.  Discussed counseling and psych referral.  Refer to psychiatry.  Start citalopram '10mg'$  q day.  Follow closely.        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  Instructed on importance of eating regular meals and staying hydrated.  Follow met b and a1c.        Vitamin D deficiency    Vitamin D supplements.  Follow.            Einar Pheasant, MD

## 2016-05-29 NOTE — Progress Notes (Signed)
Pre-visit discussion using our clinic review tool. No additional management support is needed unless otherwise documented below in the visit note.  

## 2016-06-01 ENCOUNTER — Encounter: Payer: Self-pay | Admitting: Internal Medicine

## 2016-06-01 DIAGNOSIS — R42 Dizziness and giddiness: Secondary | ICD-10-CM | POA: Insufficient documentation

## 2016-06-01 NOTE — Assessment & Plan Note (Signed)
Vitamin D supplements.  Follow.   

## 2016-06-01 NOTE — Assessment & Plan Note (Signed)
Increased stress and anxiety as outlined.  Discussed with her today.  Not sleeping well.  Mind will not shut down.  Discussed counseling and psych referral.  Refer to psychiatry.  Start citalopram 10mg  q day.  Follow closely.

## 2016-06-01 NOTE — Assessment & Plan Note (Signed)
S/p surgery.  Replace vitamin D.  Follow.

## 2016-06-01 NOTE — Assessment & Plan Note (Signed)
Had recent split night sleep study.  Negative for obstructive sleep apnea.  Problems with her mind not shutting down.  Start citalopram.  Refer to psych.  Follow.

## 2016-06-01 NOTE — Assessment & Plan Note (Signed)
No persistent dizziness.  Had the episodes as outlined.  Both seem to have occurred with decreased sleep and when had not eaten in a while.  Discussed importance of staying hydrated and eating regular meals and appropriate snacks.  Follow.

## 2016-06-01 NOTE — Assessment & Plan Note (Signed)
Off medication now.  Instructed on importance of eating regular meals and staying hydrated.  Follow met b and a1c.

## 2016-06-01 NOTE — Assessment & Plan Note (Signed)
On no blood pressure medication now.  Pressure ok.  Stay hydrated.  Follow.

## 2016-06-10 ENCOUNTER — Encounter: Payer: Self-pay | Admitting: Internal Medicine

## 2016-06-23 ENCOUNTER — Encounter (HOSPITAL_COMMUNITY): Payer: Self-pay

## 2016-07-04 ENCOUNTER — Ambulatory Visit (INDEPENDENT_AMBULATORY_CARE_PROVIDER_SITE_OTHER): Payer: 59 | Admitting: Licensed Clinical Social Worker

## 2016-07-04 ENCOUNTER — Encounter: Payer: Self-pay | Admitting: Licensed Clinical Social Worker

## 2016-07-04 DIAGNOSIS — F331 Major depressive disorder, recurrent, moderate: Secondary | ICD-10-CM | POA: Diagnosis not present

## 2016-07-04 DIAGNOSIS — F411 Generalized anxiety disorder: Secondary | ICD-10-CM

## 2016-07-04 NOTE — Progress Notes (Signed)
Comprehensive Clinical Assessment (CCA) Note  07/04/2016 Shirley [name] [id number]  Visit Diagnosis:      ICD-9-CM ICD-10-CM   1. Generalized anxiety disorder 300.02 F41.1   2. Major depressive disorder, recurrent episode, moderate (HCC) 296.32 F33.1       CCA Part One  Part One has been completed on paper by the patient.  (See scanned document in Chart Review)  CCA Part Two A  Intake/Chief Complaint:  CCA Intake With Chief Complaint CCA Part Two Date: 07/04/16 CCA Part Two Time: 1036 Chief Complaint/Presenting Problem: Couple of episodes of dizziness. In October she had weight loss surgery. Her hormone thrown out of wack with her cycle was out of wack but then got that under control. She started feeling funny, she was not sure if it was surgery related or life related. In May, she went home and on that Saturday when she got out of bed, she was dizzy, nauseous-she thought something was wrong with her physically. She another episode recently where everything was cloudy and, she was not able to get out of the car to get to work. These were similar symptoms from before so went to acute care and blood pressure, sugar checks, heart was fine all tests were fine. Ran an EKG, run a bunch of labs and two things came back that vitamin D was low and liver enzymes were high but neither related to the symptoms. She thinks it was stress, also relates taking cough medicine to fall asleep and was foggy and out of it. She has tried Melatonin 2 10 mg at night and they are helping but still wakes up in the middle of the night. She was put her on Celexa and this is helping. It helps with bad thoughts. She is keeping hydrated and eating every three hours, to make sure is getting enough protein.  Patients Currently Reported Symptoms/Problems: dizziness, restless sleep, a lot of worry, family, work, money, problems her friend may have Collateral Involvement: no Individual's Strengths: she thinks she is a good  person, people tend to like to be around her because she laughs a lot and likes to see the good in people, she laughs a lot but when she is serious she will be serious but that is far between, she is there for everybody and she is not sure if it is a strength or problem, medicine is helping her to be calmer Individual's Preferences: stress management, wants to sleep Individual's Abilities: go to the flow Type of Services Patient Feels Are Needed: psychiatrist, therapy Initial Clinical Notes/Concerns: She has had times where she feels overwhelmed or having some type of issue but lately now with taking pills that it has really helped so recognizes she has had mental health symptoms before the pills-when she is down really down, and can keep things at bay but lately more stress.  Lives with cousin and that can be stressful. It is her house. It is two women and sometimes she does not understand what she does, so she goes in her room, and she is loner, supports-cousin, friends, Loma MessingShavonna, uncle(cousin's dad), Connis, friend, Jan, friend, Corrie Dandymary, Harvie Juniorerry, Benita, [place], family in [place], cousin, [place], aunt, Joyce(money wise),   Mental Health Symptoms Depression:  Depression: Change in energy/activity, Difficulty Concentrating, Fatigue, Hopelessness, Irritability, Sleep (too much or little), Worthlessness (at times, but not lately, it is not necessarily she will do anything but more like I could leave or die, more passive, no past SA, no SIB)  Mania:  Mania:  N/A  Anxiety:   Anxiety: Difficulty concentrating, Fatigue, Irritability, Restlessness, Sleep, Tension, Worrying (worrying daily excessively, may have had a panic attacks years ago-something wrong with chest but they said it was a panic attack)  Psychosis:  Psychosis: N/A  Trauma:  Trauma: N/A  Obsessions:  Obsessions: N/A  Compulsions:  Compulsions: N/A  Inattention:  Inattention: N/A  Hyperactivity/Impulsivity:     Oppositional/Defiant Behaviors:   Oppositional/Defiant Behaviors: N/A  Borderline Personality:  Emotional Irregularity: N/A  Other Mood/Personality Symptoms:      Mental Status Exam Appearance and self-care  Stature:  Stature: Small  Weight:  Weight: Average weight  Clothing:  Clothing: Casual  Grooming:  Grooming: Normal  Cosmetic use:  Cosmetic Use: None  Posture/gait:  Posture/Gait: Normal  Motor activity:  Motor Activity: Not Remarkable  Sensorium  Attention:  Attention: Normal  Concentration:  Concentration: Normal  Orientation:  Orientation: X5  Recall/memory:  Recall/Memory: Normal  Affect and Mood  Affect:  Affect: Appropriate  Mood:  Mood: Angry, Depressed  Relating  Eye contact:  Eye Contact: Normal  Facial expression:  Facial Expression: Responsive  Attitude toward examiner:  Attitude Toward Examiner: Cooperative  Thought and Language  Speech flow: Speech Flow: Normal  Thought content:  Thought Content: Appropriate to mood and circumstances  Preoccupation:     Hallucinations:     Organization:     Company secretary of Knowledge:  Fund of Knowledge: Average  Intelligence:  Intelligence: Average  Abstraction:  Abstraction: Normal  Judgement:  Judgement: Fair  Dance movement psychotherapist:  Reality Testing: Realistic  Insight:  Insight: Fair  Decision Making:  Decision Making: Normal  Social Functioning  Social Maturity:  Social Maturity: Isolates, Responsible  Social Judgement:  Social Judgement: Normal, "Garment/textile technologist  Stress  Stressors:  Stressors: Work, Arts administrator (family can be stressful at times, )  Coping Ability:  Coping Ability: Horticulturist, commercial Deficits:     Supports:      Family and Psychosocial History: Family history Marital status: Single Are you sexually active?: No What is your sexual orientation?: hetersexual Has your sexual activity been affected by drugs, alcohol, medication, or emotional stress?: no Does patient have children?: No  Childhood History:  Childhood History By  whom was/is the patient raised?: Mother Additional childhood history information: good  Description of patient's relationship with caregiver when they were a child: dad-not around, mom-good Patient's description of current relationship with people who raised him/her: dad-does not see often lives in [place] area, he takes care of his wife and his kids-it will never be what other girls have but come to accept it, mom-[place]-good, How were you disciplined when you got in trouble as a child/adolescent?: whoopings, punishments-normal childhood Does patient have siblings?: Yes Number of Siblings: 3 Description of patient's current relationship with siblings: oldest-she doesn't see the two by her dad often, but one in [place] she grew up with and it is good, all of relationships are good but doesn't see two of them often even though it is good.  Did patient suffer any verbal/emotional/physical/sexual abuse as a child?: No Did patient suffer from severe childhood neglect?: No Has patient ever been sexually abused/assaulted/raped as an adolescent or adult?: No Was the patient ever a victim of a crime or a disaster?: No Witnessed domestic violence?: No Has patient been effected by domestic violence as an adult?: No  CCA Part Two B  Employment/Work Situation: Employment / Work Situation Employment situation: Employed Where is patient currently employed?: [employer]-stressful How long  has patient been employed?: 18 Patient's job has been impacted by current illness: Yes Describe how patient's job has been impacted: job could be impacting stress What is the longest time patient has a held a job?: 18 Where was the patient employed at that time?: [employer] Has patient ever been in the [employer]?: No Has patient ever served in combat?: No Did You Receive Any Psychiatric Treatment/Services While in Equities trader?: No Are There Guns or Other Weapons in Your Home?: Yes Types of Guns/Weapons: unsure but  dad's gun Are These Comptroller?: Yes  Education: Education School Currently Attending: no Last Grade Completed: 14 Name of High School: [employer] Did Garment/textile technologist From [employer]?: Yes Did Theme park manager?: Yes What Type of College Degree Do you Have?: Yellow Belt certificate-business related What Was Your Major?: computer graphics Did You Have Any Special Interests In School?: n/a Did You Have An Individualized Education Program (IIEP): No Did You Have Any Difficulty At School?: No  Religion: Religion/Spirituality Are You A Religious Person?: Yes What is Your Religious Affiliation?: Non-Denominational How Might This Affect Treatment?: unsure  Leisure/Recreation: Leisure / Recreation Leisure and Hobbies: read, watch TV, family around she is with them, she doesn't do anything that stresses her out  Exercise/Diet: Exercise/Diet Do You Exercise?: No Have You Gained or Lost A Significant Amount of Weight in the Past Six Months?: Yes-Lost Number of Pounds Lost?: 70 Do You Follow a Special Diet?: Yes Type of Diet: making sure she gets enough protein Do You Have Any Trouble Sleeping?: Yes Explanation of Sleeping Difficulties: trouble getting to sleep, staying asleep and trouble waking up, better since melatonin. She is concerned with it impacting her brain  CCA Part Two C  Alcohol/Drug Use: Alcohol / Drug Use Pain Medications: n/a Prescriptions: see med list Over the Counter: see med list History of alcohol / drug use?: No history of alcohol / drug abuse                      CCA Part Three  ASAM's:  Six Dimensions of Multidimensional Assessment  Dimension 1:  Acute Intoxication and/or Withdrawal Potential:     Dimension 2:  Biomedical Conditions and Complications:     Dimension 3:  Emotional, Behavioral, or Cognitive Conditions and Complications:     Dimension 4:  Readiness to Change:     Dimension 5:  Relapse, Continued use, or  Continued Problem Potential:     Dimension 6:  Recovery/Living Environment:      Substance use Disorder (SUD)    Social Function:  Social Functioning Social Maturity: Isolates, Responsible Social Judgement: Normal, "Chief of Staff"  Stress:  Stress Stressors: Work, Arts administrator (family can be stressful at times, ) Coping Ability: Exhausted Patient Takes Medications The Way The Doctor Instructed?: No (taking Celexa every day but skipped a day and then another, she takes it when she remembers, when the thoughts come she takes a pill) Priority Risk: Low Acuity  Risk Assessment- Self-Harm Potential: Risk Assessment For Self-Harm Potential Thoughts of Self-Harm: No current thoughts Method: No plan Availability of Means: Have close by  Risk Assessment -Dangerous to Others Potential: Risk Assessment For Dangerous to Others Potential Method: No Plan Availability of Means: No access or NA Intent: Vague intent or NA Notification Required: No need or identified person  DSM5 Diagnoses: Patient Active Problem List   Diagnosis Date Noted  . Generalized anxiety disorder 07/04/2016  . Major depressive disorder, recurrent episode, moderate (HCC) 07/04/2016  .  Dizziness 06/01/2016  . Fatigue 05/01/2016  . Sleep difficulties 02/12/2016  . Bariatric surgery status 08/28/2015  . 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 Severe obesity (BMI >= 40) (HCC) 09/04/2014  . Stress 02/14/2014  . SOB (shortness of breath) 09/15/2013  . Environmental allergies 09/15/2013  . Microalbuminuria 07/27/2013  . Vitamin D deficiency 10/09/2012  . Hypertension 09/06/2012  . GERD (gastroesophageal reflux disease) 09/06/2012    Patient Centered Plan: Patient is on the following Treatment Plan(s):  Anxiety, Depression and Low Self-Esteem  Recommendations for Services/Supports/Treatments: Recommendations for Services/Supports/Treatments Recommendations For Services/Supports/Treatments:  Individual Therapy, Medication Management  Treatment Plan Summary: Patient is a 35 year old single female who was referred by her primary care doctor due to a couple recent episodes of dizziness, nausea, cloudiness, that on the last episode cause some difficulty in mobility. There were medical tests done that came back normal due to her particular symptoms so the symptoms could be stress related. Patient had bariatric surgery last year and she is making sure to stay hydrated and making sure that her protein intake is sufficient. She identifies stressors as work, family stressors on and off and living situation can be stressful. She endorses symptoms both of depression and anxiety. She endorses needing to work on self-esteem issues. He also relates that one of her main problems is problems with sleep and that melatonin has not been sufficiently effective. She is taking Celexa and that has helped her with what she describes as "bad thoughts". She has had therapy on a couple occasions in the past and said they have been helpful. She has had passive SI in the past but none currently and contracts for safety. She denies HI, past SA, past SIB, and drug and alcohol abuse. She is recommended for individual therapy to help with coping strategies to manage anxiety and depression, supportive interventions and stress management as well as referral to psychiatry for medication management.     Referrals to Alternative Service(s): Referred to Alternative Service(s):   Place:   Date:   Time:    Referred to Alternative Service(s):   Place:   Date:   Time:    Referred to Alternative Service(s):   Place:   Date:   Time:    Referred to Alternative Service(s):   Place:   Date:   Time:     Bowman,Mary A

## 2016-07-10 ENCOUNTER — Encounter: Payer: Self-pay | Admitting: Psychiatry

## 2016-07-10 ENCOUNTER — Ambulatory Visit (INDEPENDENT_AMBULATORY_CARE_PROVIDER_SITE_OTHER): Payer: 59 | Admitting: Psychiatry

## 2016-07-10 VITALS — BP 119/82 | HR 76 | Temp 98.9°F | Ht 61.5 in | Wt 217.2 lb

## 2016-07-10 DIAGNOSIS — F5101 Primary insomnia: Secondary | ICD-10-CM

## 2016-07-10 DIAGNOSIS — F331 Major depressive disorder, recurrent, moderate: Secondary | ICD-10-CM

## 2016-07-10 MED ORDER — VENLAFAXINE HCL ER 75 MG PO CP24: 75.0000 mg | ORAL_CAPSULE | Freq: Every day | ORAL | 0 refills | Status: DC

## 2016-07-10 MED ORDER — ZALEPLON 5 MG PO CAPS: 5.0000 mg | ORAL_CAPSULE | Freq: Every evening | ORAL | 1 refills | Status: DC | PRN

## 2016-07-10 NOTE — Progress Notes (Signed)
Psychiatric Initial Adult Assessment   Patient Identification: Shirley [name] MRN:  [id number] Date of Evaluation:  07/10/2016 Referral Source: Corrie Dandy- Therapist Chief Complaint:   Visit Diagnosis:    ICD-9-CM ICD-10-CM   1. Major depressive disorder, recurrent episode, moderate (HCC) 296.32 F33.1   2. Generalized anxiety disorder 300.02 F41.1     History of Present Illness:   Patient is a 35 year old female who presented for initial assessment. She reported that she has long history of insomnia and is unable to sleep at night. She was referred by her therapist. She reported that she currently works at [employer] and is unable to sleep well at night. She has been feeling depressed and has more swings at times. She reported that she does not have any thoughts to hurt herself. Patient reported that she feels depressed hopeless helpless at times and has been started on Celexa by her primary care physician. However she has not noticed improvement on the medication. She is also taking melatonin on a when necessary basis but it is not helpful. She has tried other medications the past including Ambien long time ago. She is willing to try another medication to help her sleep at night. She currently denied using any drugs or alcohol at this time. She appeared calm and collected during the interview.  Associated Signs/Symptoms: Depression Symptoms:  insomnia, psychomotor retardation, fatigue, feelings of worthlessness/guilt, anxiety, loss of energy/fatigue, disturbed sleep, decreased appetite, (Hypo) Manic Symptoms:  Labiality of Mood, Anxiety Symptoms:  Excessive Worry, Psychotic Symptoms:  none PTSD Symptoms: Negative NA  Past Psychiatric History:  H/o depression. She has tried several medications in the past. She reported that the medications are not effective at this time. No h/o suicide attempts.   Previous Psychotropic Medications:  Ambien   Melatonin Celexa Ativan Zoloft   Substance Abuse History in the last 12 months:  Yes.   Social drinker  Consequences of Substance Abuse: Negative NA  Past Medical History:  Past Medical History:  Diagnosis Date  . Anemia   . Asthma   . Complication of anesthesia    itching after surgery 2005, 2006  . Depression   . Diabetes mellitus (HCC)   . Fatty liver   . GERD (gastroesophageal reflux disease)   . Hypertension   . IBS (irritable bowel syndrome)   . Migraine   . PONV (postoperative nausea and vomiting)    nausea    Past Surgical History:  Procedure Laterality Date  . CHOLECYSTECTOMY  2005  . LAPAROSCOPIC GASTRIC RESTRICTIVE DUODENAL PROCEDURE (DUODENAL SWITCH) N/A 08/28/2015   Procedure: LAPAROSCOPIC GASTRIC RESTRICTIVE DUODENAL PROCEDURE (DUODENAL SWITCH);  Surgeon: [name], MD;  Location: [facility] ORS;  Service: General;  Laterality: N/A;  . 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 Hypertension Mother   . Diabetes Mother   . Asthma Brother   . Breast cancer Neg Hx   . Colon cancer Neg Hx     Social History:   Social History   Social History  . Marital status: Single    Spouse name: N/A  . Number of children: 0  . Years of education: N/A   Occupational History  .  [employer]   Social History Main Topics  . Smoking status: Never Smoker  . Smokeless tobacco: Never Used  . Alcohol use No     Comment: social  . Drug use:  No  . Sexual activity: Not Currently   Other Topics Concern  . None   Social History Narrative  . 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  [facility]- last Oct- lost 50 lbs since then.   Lives with cousin. Never married, no children.  Family lives in [place].    Allergies:   Allergies  Allergen Reactions  . Caffeine Other (See Comments)    Stomach issues  .  Ibuprofen Other (See Comments)    Upset stomach    Metabolic Disorder Labs: Lab Results  Component Value Date   HGBA1C 5.6 01/29/2016   No results found for: PROLACTIN Lab Results  Component Value Date   CHOL 129 05/01/2016   TRIG 67 05/01/2016   HDL 62 05/01/2016   LDLCALC 54 05/01/2016   LDLCALC 47 03/21/2016     Current Medications: Current Outpatient Prescriptions  Medication Sig Dispense Refill  . albuterol (PROVENTIL HFA;VENTOLIN HFA) 108 (90 BASE) MCG/ACT inhaler Inhale into the lungs every 6 (six) hours as needed for wheezing or shortness of breath. Reported on 05/01/2016    . azelastine (OPTIVAR) 0.05 % ophthalmic solution 1 drop as needed. Reported on 05/01/2016    . beclomethasone (QVAR) 40 MCG/ACT inhaler Inhale 1 puff into the lungs as needed. Reported on 05/01/2016    . citalopram (CELEXA) 10 MG tablet Take 1 tablet (10 mg total) by mouth daily. 30 tablet 1  . glucose blood (BAYER CONTOUR NEXT TEST) test strip Use 1 strip two times daily. 200 each 11  . levocetirizine (XYZAL) 5 MG tablet Take 5 mg by mouth every evening. Reported on 05/01/2016    . LORazepam (ATIVAN) 0.5 MG tablet Take 1/2 to one tablet q day prn 15 tablet 0  . Olopatadine HCl (PATANASE) 0.6 % SOLN Place into the nose as needed. Reported on 05/01/2016    . Vitamin D, Ergocalciferol, (DRISDOL) 50000 units CAPS capsule Take 1 capsule (50,000 Units total) by mouth every 7 (seven) days. 4 capsule 2   No current facility-administered medications for this visit.     Neurologic: Headache: No Seizure: No Paresthesias:No  Musculoskeletal: Strength & Muscle Tone: within normal limits Gait & Station: normal Patient leans: N/A  Psychiatric Specialty Exam: Review of Systems  Psychiatric/Behavioral: Positive for depression. The patient has insomnia.   All other systems reviewed and are negative.   Blood pressure 119/82, pulse 76, temperature 98.9 F (37.2 C), temperature source Oral, height 5' 1.5" (1.562  m), weight 217 lb 3.2 oz (98.5 kg), last menstrual period 07/03/2016.Body mass index is 40.37 kg/m.  General Appearance: Casual  Eye Contact:  Fair  Speech:  Clear and Coherent  Volume:  Normal  Mood:  Anxious  Affect:  Congruent  Thought Process:  Coherent  Orientation:  Full (Time, Place, and Person)  Thought Content:  Logical  Suicidal Thoughts:  No  Homicidal Thoughts:  No  Memory:  Immediate;   Fair Recent;   Fair Remote;   Fair  Judgement:  Fair  Insight:  Fair  Psychomotor Activity:  Normal  Concentration:  Concentration: Fair and Attention Span: Fair  Recall:  F[employer]ervFair  Fund of Knowledge:Fair  Language: Fair  Akathisia:  No  Handed:  Right  AIMS (if indicated):    Assets:  Communication Skills Desire for Improvement Physical Health Social Support  ADL's:  Intact  Cognition: WNL  Sleep:  poor    Treatment Plan Summary: Medication management  Discussed with patient what the medications treatment risk benefits and alternatives. I will start her on Sonata 5 mg by mouth daily at bedtime for insomnia. I will start her on Effexor XR 75 mg in the morning for anxiety and depression and she agreed with the plan Discussed with her about side effects of the medication she demonstrated understanding  Follow-up in one month or earlier depending on her symptoms   More than 50% of the time spent in psychoeducation, counseling and coordination of care.    This note was generated in part or whole with voice recognition software. Voice regonition is usually quite accurate but there are transcription errors that can and very often do occur. I apologize for any typographical errors that were not detected and corrected.    Brandy HaleUzma Sandra Brents, MD 8/17/20173:09 PM

## 2016-07-18 ENCOUNTER — Ambulatory Visit (INDEPENDENT_AMBULATORY_CARE_PROVIDER_SITE_OTHER): Payer: 59 | Admitting: Licensed Clinical Social Worker

## 2016-07-18 DIAGNOSIS — F331 Major depressive disorder, recurrent, moderate: Secondary | ICD-10-CM | POA: Diagnosis not present

## 2016-07-18 DIAGNOSIS — F411 Generalized anxiety disorder: Secondary | ICD-10-CM

## 2016-07-18 NOTE — Progress Notes (Signed)
   THERAPIST PROGRESS NOTE  Session Time: 11 AM to 11:50 AM  Participation Level: Active  Behavioral Response: CasualAlert and LethargicDysphoric  Type of Therapy: Individual Therapy  Treatment Goals addressed:  patient will manage stress better, establish better balance and [name] healthier lifestyle  Interventions: CBT, Motivational Interviewing, Solution Focused, Strength-based, Supportive, Reframing and Other: Skills to work on self-esteem  Summary: Shirley [name] is [name] 35 y.o. female who with an attitude shift that is helping her anxiety. She says if she can deal with something she will and if she can't she just lets it go. In exploring this shift patient explains that it is related to being tired. She explains that everyone is  coming at her and she doesn't know how to get break. She has stress at work but so exhausted that it hasn't impacted her as much. Reviewed lyrics of [name] song and she related to the feeling of being stuck. She said she just needs some space to be left alone. Discussed patient's difficulty in saying no and her need to set better boundaries. She said part is because of her and part is because of her work. Discussed challenging unhelpful schemas and suggested patient remind herself of her commitment to self as [name] priority. Patient committed to take [name] step of self-care by starting to have lunch on her own. Completed treatment plan and patient wants to handle stress better, establish better balance and [name] healthy lifestyle   Suicidal/Homicidal: No  Therapist Response: Reviewed worksheet "what is generalized anxiety" and provided positive feedback to patient that her attitude is helping her move from worry to problem solving. Explained that staying in worrying mode is unhelpful and causes the worry to be worse and moving into problem solving is effective coping for anxiety. Discussed cognitive distortions that lead to anxiety and challenging those thoughts for their accuracy.  Discussed needing to take care of herself as steps to work on self-esteem and recognizing she is [name] caretaker but also has an obligation of self-care. Provided motivational strategies and explained that patient's quality of life will improve as she makes herself [name] priority. Discussed the importance of improvement once quality of life. Completed treatment plan with patient  Plan: Return again in 2 weeks.2. Patient will take [name] step and working on self-care by eating lunch on her own. 3.Patient will work on engaging in activities of her choice and setting better boundaries with people  Diagnosis: Axis I: major depressive disorder, recurrent, moderate, generalized anxiety disorder    Axis II: No diagnosis    Shirley Mccann A, LCSW 07/18/2016

## 2016-07-31 ENCOUNTER — Encounter: Payer: Self-pay | Admitting: Internal Medicine

## 2016-07-31 ENCOUNTER — Ambulatory Visit (INDEPENDENT_AMBULATORY_CARE_PROVIDER_SITE_OTHER): Payer: 59 | Admitting: Internal Medicine

## 2016-07-31 VITALS — BP 130/82 | HR 80 | Ht 61.0 in | Wt 217.0 lb

## 2016-07-31 DIAGNOSIS — E119 Type 2 diabetes mellitus without complications: Secondary | ICD-10-CM | POA: Diagnosis not present

## 2016-07-31 LAB — POCT GLYCOSYLATED HEMOGLOBIN (HGB A1C): Hemoglobin A1C: 6

## 2016-07-31 NOTE — Progress Notes (Signed)
Patient ID: Shirley [name], female   DOB: [birth year], 35 y.o.   MRN: [id number]  HPI: Shirley [name] is a 35 y.o.-year-old female, returning for f/u for DM2, dx 2005, non-insulin-dependent, uncontrolled, with complications (MAU). Last visit 6 mo ago.  She had GBP sx (sleeve gastrectomy) on 08/28/2015. She developed hypoglycemia and hypotension after the Sx. She had syncope in the hospital. She now feels well and her diabetes has dramatically improved >> off DM meds now.  She lost 50 lbs since the surgery. Now maintaining. She needs to eat q 2 hours >> if she misses one >> may get dizzy.   Last hemoglobin A1c was: Lab Results  Component Value Date   HGBA1C 5.6 01/29/2016   HGBA1C 5.9 11/01/2015   HGBA1C 7.5 (H) 08/10/2015  07/15/2015: 7.1% 07/13/2014: 7.2%  - at work 01/2014: 7.8% 08/2013: 7.2%  She was previously on: - Januvia 100 mg in am - Metformin XR (osm) 1000 mg tabs 2x a day with meals >> some loose stools - Invokana 100 - added 03/2014 She was on Janumet XR 1000-100 mg po with dinner (used to cut them in half to swallow them better!!!) She was on Metformin in the past >> diarrhea at max dose  Pt checks her sugars 0-3x a day and they are normal: - am: 120-142 >> n/c >> 124-145 >> 135-140 >> 124-162 >> 88-120, 139, 165x1 >> 90-100 >> 90s - 2h after b'fast: 98-164, 187 x1>> 115-148, 172 >> 133-144, 194 >> 150s >> 125-141 >> 85 >> 120s >> 110-120s, 145 - before lunch: n/c >> 111-129 >> 87, 104-135, 184 >> 98-120 >> 104-149, 161 >> n/c - 2h after lunch: 101-177 >> 144, 153 >> n/c >> 145-160 >> 121-174 >> 87, 100-121, 146 >> n/c - before dinner: 122-160, 181 (snack before leaves work) >> 98-120 >> 131-162 >> 100-136 >> n/c - 2h after dinner: n/c  - bedtime: n/c - nighttime: n/c No lows. Lowest sugar was 111 >> 98 >> 85 >> 90 >> 90;? if  she has hypoglycemia awareness. Highest sugar was 195x1 >> 200 >> 184 >> 200s (steroids) >> 170 >> 165 >> 130s > 145.  Pt's meals are: -  Breakfast: protein shake - Lunch: leftovers from dinner; sandwich - Dinner: hamburgers; chicken + veggies + starch + cookie or cake on weekends  - no CKD, last BUN/creatinine normal:  Lab Results  Component Value Date   BUN 10 05/01/2016   CREATININE 0.63 05/01/2016  She is on Losartan. Last ACR: 30.1 in 07/21/2013 - No HL: Lab Results  Component Value Date   CHOL 129 05/01/2016   HDL 62 05/01/2016   LDLCALC 54 05/01/2016   TRIG 67 05/01/2016  - [facility] No DR.  - no numbness and tingling in her feet. Last foot exam: normal 11/13/2015.  She also has a history of vit D def., HTN, GERD. Had a normal gastric emptying study.   I reviewed pt's medications, allergies, PMH, social hx, family hx, and changes were documented in the history of present illness. Otherwise, unchanged from my initial visit note.  ROS: Constitutional: no weight gain/loss, no fatigue, no subjective hyperthermia/hypothermia Eyes: no blurry vision, no xerophthalmia ENT: + sore throat, no nodules palpated in throat, no dysphagia/odynophagia, no hoarseness Cardiovascular: no CP/SOB/no palpitations/+ leg swelling Respiratory: no cough/SOB Gastrointestinal: no N/V/D/C Musculoskeletal: no muscle/no joint aches Skin: no rashes Neurological: no tremors/numbness/tingling/+ dizziness  PE: BP 130/82 (BP Location: Left Arm,  Patient Position: Sitting)   Pulse 80   Ht 5\' 1"  (1.549 m)   Wt 217 lb (98.4 kg)   LMP 07/03/2016 (Approximate)   SpO2 97%   BMI 41.00 kg/m  Body mass index is 41 kg/m. Wt Readings from Last 3 Encounters:  07/31/16 217 lb (98.4 kg)  07/10/16 217 lb 3.2 oz (98.5 kg)  05/29/16 217 lb (98.4 kg)   Constitutional: obese, in NAD Eyes: PERRLA, EOMI, no exophthalmos ENT: moist mucous membranes, no thyromegaly, no cervical lymphadenopathy Cardiovascular: RRR, No MRG Respiratory: CTA B Gastrointestinal: abdomen soft, NT, ND, BS+ Musculoskeletal: no deformities,  strength intact in all 4 Skin: moist, warm, no rashes, acanthosis nigricans on neck; hirsutism face, chest Neurological: no tremor with outstretched hands, DTR normal in all 4   ASSESSMENT: 1. DM2, non-insulin-dependent, uncontrolled, without complications - MAU  PLAN:  1. Patient with long-standing, now resolved diabetes, off all her DM meds after her gastric sleeve procedure. Sugars are at goal. Will let her stay off her meds >> see her back in 6 mo.  - we discussed about dietary changes to avoid hypoglycemia - I also advised her to: Patient Instructions  Please stay off all diabetes meds for now.   Please let me know if the sugars are consistently >120 before a meal and >150 after a meal.  Please come back for a follow-up appointment in 6 months.  - continue checking sugars at different times of the day - check 0-1 times a day, rotating checks  - needs a new eye exam - will check a new UACR today - checked HbA1C >> 6% (slightly higher) - Return to clinic in 6 mo.  Office Visit on 07/31/2016  Component Date Value Ref Range Status  . Creatinine, Urine 08/01/2016 181.7  Not Estab. mg/dL Final  . Microalbum.,U,Random 08/01/2016 20.4  Not Estab. ug/mL Final  . MICROALB/CREAT RATIO 08/01/2016 11.2  0.0 - 30.0 mg/g creat Final  . Hemoglobin A1C 07/31/2016 6.0   Final   ACR stable.  Carlus Pavlov, MD PhD [facility] Endocrinology

## 2016-07-31 NOTE — Patient Instructions (Signed)
Please stay off all diabetes meds for now.   Let me know if the sugars are consistently >120 before a meal and >150 after a meal.  Please come back for a follow-up appointment in 6 months.

## 2016-08-01 ENCOUNTER — Ambulatory Visit (INDEPENDENT_AMBULATORY_CARE_PROVIDER_SITE_OTHER): Payer: 59 | Admitting: Licensed Clinical Social Worker

## 2016-08-01 DIAGNOSIS — F5101 Primary insomnia: Secondary | ICD-10-CM | POA: Diagnosis not present

## 2016-08-01 DIAGNOSIS — F331 Major depressive disorder, recurrent, moderate: Secondary | ICD-10-CM

## 2016-08-01 DIAGNOSIS — F411 Generalized anxiety disorder: Secondary | ICD-10-CM

## 2016-08-01 LAB — MICROALBUMIN / CREATININE URINE RATIO
Creatinine, Urine: 181.7 mg/dL
MICROALB/CREAT RATIO: 11.2 mg/g creat (ref 0.0–30.0)
Microalbumin, Urine: 20.4 ug/mL

## 2016-08-01 NOTE — Progress Notes (Signed)
   THERAPIST PROGRESS NOTE  Session Time: 10:15 AM to 11:05 AM  Participation Level: Active  Behavioral Response: CasualAlertEuthymic  Type of Therapy: Individual Therapy  Treatment Goals addressed:  patient will manage stress better, establish better balance and a healthier lifestyle  Interventions: CBT, DBT, Solution Focused, Strength-based, Supportive and Other: Problem-solving, implementing healthy relationship skills  Summary: Shirley [name] is a 35 y.o. female who presents with she has had a good week and felt better after she stopped taking the Effexor due to severe migraines. She has upcoming appointment with psychiatrist to discuss medications. She explained that she still having problems with sleep.Explained that she felt good getting a tattoo that says "live free". She explained that this meant that she did not have to answer to anybody, do what she wants to do and go where she wants to do. Shared that with tattoos and helpful for minder to live this way.She has been trying to set boundaries. Her attitude of letting things go has been helpful. Completed problem-solving chart to address feeling overwhelmed at work and demands of personal relationships. Patient brainstorm solutions looked at the pros and cons of solutions and came up with a list of the best solutions and steps she needs to take to put the solutions into action. Patient identified 1. Praying more, 2. Not picking up the phone.3. Saying no and setting boundaries as a raised to practice self-care, 4. Exercise as a way to practice self-care, improve mood, improve health and spend more time with God 5. Being honest with people and practicing a healthy attitude of doing what she can and not let it go so she does not set unreasonable expectations on herself, 6. Implement positive activities in the day to improve mood and provide moments in the days she can enjoy  Suicidal/Homicidal: No  Therapist Response: Discussed with  patient the need to work with Dr. to change medications as there is risk of side effects or increased risk in severity of symptoms. Reviewed progress patient has made in being engaging in activities of her choice in setting boundaries. Energy use worksheet on "problem-solving". Discussed problem solving as a constructive thought processes focus and how we can flexibly and effectively deal with the problem at hand. Completed problem-solving chart with patient. Chart was affective in helping patient to identify strategies to help her with problem of feeling overwhelmed at work and overwhelmed in helping friends and family.  Plan: Return again in 2 weeks.2. Patient will begin to exercise. 3. Patient will begin to implement action on solutions she identified on problem-solving sheet.   Diagnosis: Axis I:  major depressive disorder, recurrent, moderate, generalized anxiety disorder    Axis II: No diagnosis    Bowman,Mary A, LCSW 08/01/2016

## 2016-08-11 ENCOUNTER — Encounter: Payer: Self-pay | Admitting: Psychiatry

## 2016-08-11 ENCOUNTER — Ambulatory Visit (INDEPENDENT_AMBULATORY_CARE_PROVIDER_SITE_OTHER): Payer: 59 | Admitting: Psychiatry

## 2016-08-11 VITALS — BP 129/88 | HR 78 | Temp 98.8°F | Ht 61.0 in | Wt 215.6 lb

## 2016-08-11 DIAGNOSIS — F331 Major depressive disorder, recurrent, moderate: Secondary | ICD-10-CM

## 2016-08-11 MED ORDER — CITALOPRAM HYDROBROMIDE 20 MG PO TABS: 20.0000 mg | ORAL_TABLET | Freq: Every day | ORAL | 1 refills | Status: DC

## 2016-08-11 MED ORDER — TRAZODONE HCL 100 MG PO TABS: 100.0000 mg | ORAL_TABLET | Freq: Every day | ORAL | 1 refills | Status: DC

## 2016-08-11 NOTE — Progress Notes (Signed)
Psychiatric MD Progress Note  Patient Identification: Shirley [name] MRN:  [id number] Date of Evaluation:  08/11/2016 Referral Source: Corrie Dandy- Therapist Chief Complaint:   Chief Complaint    Follow-up; Medication Refill     Visit Diagnosis:    ICD-9-CM ICD-10-CM   1. Major depressive disorder, recurrent episode, moderate (HCC) 296.32 F33.1     History of Present Illness:   Patient is a 35 year old female who presented for follow up.  She reported that she has long history of insomnia and is unable to sleep at night To help of Sonata. She reported that she tried the medication but would was not helpful. She wants to have her medications adjusted. She also reported that she was having headaches from the effexor  and she stopped the medication. Now she is taking the Celexa again. She feels that she can go on the higher dose the medication. Patient currently denied having any suicidal ideations or plan. 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 We discussed about different strategies to relax herself at home. She enjoys watching television at home. She denied having any suicidal ideation or plan. She was calm and cooperative during the interview.. She was referred by her therapist.     Associated Signs/Symptoms: Depression Symptoms:  insomnia, psychomotor retardation, fatigue, feelings of worthlessness/guilt, anxiety, loss of energy/fatigue, disturbed sleep, decreased appetite, (Hypo) Manic Symptoms:  Labiality of Mood, Anxiety Symptoms:  Excessive Worry, Psychotic Symptoms:  none PTSD Symptoms: Negative NA  Past Psychiatric History:  H/o depression. She has tried several medications in the past. She reported that the medications are not effective at this time. No h/o suicide attempts.   Previous Psychotropic Medications:  Ambien  Melatonin Celexa Ativan Zoloft   Substance  Abuse History in the last 12 months:  Yes.   Social drinker  Consequences of Substance Abuse: Negative NA  Past Medical History:  Past Medical History:  Diagnosis Date  . Anemia   . Asthma   . Complication of anesthesia    itching after surgery 2005, 2006  . Depression   . Diabetes mellitus (HCC)   . Fatty liver   . GERD (gastroesophageal reflux disease)   . Hypertension   . IBS (irritable bowel syndrome)   . Migraine   . PONV (postoperative nausea and vomiting)    nausea    Past Surgical History:  Procedure Laterality Date  . CHOLECYSTECTOMY  2005  . LAPAROSCOPIC GASTRIC RESTRICTIVE DUODENAL PROCEDURE (DUODENAL SWITCH) N/A 08/28/2015   Procedure: LAPAROSCOPIC GASTRIC RESTRICTIVE DUODENAL PROCEDURE (DUODENAL SWITCH);  Surgeon: [name], MD;  Location: [facility] ORS;  Service: General;  Laterality: N/A;  . 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 Hypertension Mother   . Diabetes Mother   . Asthma Brother   . Breast cancer Neg Hx   . Colon cancer Neg Hx     Social History:   Social History   Social History  . Marital status: Single    Spouse name: N/A  . Number of children: 0  . Years of education: N/A   Occupational History  .  [employer]   Social History Main Topics  . Smoking status: Never Smoker  . Smokeless tobacco: Never Used  . Alcohol use No     Comment:  social  . Drug use: No  . Sexual activity: Not Currently   Other Topics Concern  . None   Social History Narrative  . 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  [facility]- lost 50 lbs since then.   Lives with cousin. Never married, no children.  [place]    Allergies:   Allergies  Allergen Reactions  . Caffeine Other (See Comments)    Stomach issues  . Ibuprofen Other (See Comments)    Upset stomach     Metabolic Disorder Labs: Lab Results  Component Value Date   HGBA1C 6.0 07/31/2016   No results found for: PROLACTIN Lab Results  Component Value Date   CHOL 129 05/01/2016   TRIG 67 05/01/2016   HDL 62 05/01/2016   LDLCALC 54 05/01/2016   LDLCALC 47 03/21/2016     Current Medications: Current Outpatient Prescriptions  Medication Sig Dispense Refill  . albuterol (PROVENTIL HFA;VENTOLIN HFA) 108 (90 BASE) MCG/ACT inhaler Inhale into the lungs every 6 (six) hours as needed for wheezing or shortness of breath. Reported on 05/01/2016    . azelastine (OPTIVAR) 0.05 % ophthalmic solution 1 drop as needed. Reported on 05/01/2016    . beclomethasone (QVAR) 40 MCG/ACT inhaler Inhale 1 puff into the lungs as needed. Reported on 05/01/2016    . citalopram (CELEXA) 20 MG tablet Take 1 tablet (20 mg total) by mouth daily. 30 tablet 1  . glucose blood (BAYER CONTOUR NEXT TEST) test strip Use 1 strip two times daily. 200 each 11  . levocetirizine (XYZAL) 5 MG tablet Take 5 mg by mouth every evening. Reported on 05/01/2016    . Olopatadine HCl (PATANASE) 0.6 % SOLN Place into the nose as needed. Reported on 05/01/2016    . Vitamin D, Ergocalciferol, (DRISDOL) 50000 units CAPS capsule Take 1 capsule (50,000 Units total) by mouth every 7 (seven) days. 4 capsule 2  . traZODone (DESYREL) 100 MG tablet Take 1 tablet (100 mg total) by mouth at bedtime. 30 tablet 1   No current facility-administered medications for this visit.     Neurologic: Headache: No Seizure: No Paresthesias:No  Musculoskeletal: Strength & Muscle Tone: within normal limits Gait & Station: normal Patient leans: N/A  Psychiatric Specialty Exam: Review of Systems  Psychiatric/Behavioral: Positive for depression. The patient has insomnia.   All other systems reviewed and are negative.   Blood pressure 129/88, pulse 78, temperature 98.8 F (37.1 C), temperature source Oral, height 5\' 1"  (1.549 m), weight 215 lb 9.6 oz (97.8 kg),  last menstrual period 07/03/2016.Body mass index is 40.74 kg/m.  General Appearance: Casual  Eye Contact:  Fair  Speech:  Clear and Coherent  Volume:  Normal  Mood:  Anxious  Affect:  Congruent  Thought Process:  Coherent  Orientation:  Full (Time, Place, and Person)  Thought Content:  Logical  Suicidal Thoughts:  No  Homicidal Thoughts:  No  Memory:  Immediate;   Fair Recent;   Fair Remote;   Fair  Judgement:  Fair  Insight:  Fair  Psychomotor Activity:  Normal  Concentration:  Concentration: Fair and Attention Span: Fair  Recall:  [employer]Fair  Fund of Knowledge:Fair  Language: Fair  Akathisia:  No  Handed:  Right  AIMS (if indicated):    Assets:  Communication Skills Desire for Improvement Physical Health Social Support  ADL's:  Intact  Cognition: WNL  Sleep:  poor  Treatment Plan Summary: Medication management    Discussed with patient what the medications treatment risk benefits and alternatives. D/c sonata  D/c effexor I will start her on trazodone 100 mg and advised patient to take between 50-150 mg at night. She demonstrated understanding. I will titrate the dose of Celexa to 20 mg daily.  Discussed with her about side effects of the medication she demonstrated understanding  Follow-up in one month or earlier depending on her symptoms   More than 50% of the time spent in psychoeducation, counseling and coordination of care.    This note was generated in part or whole with voice recognition software. Voice regonition is usually quite accurate but there are transcription errors that can and very often do occur. I apologize for any typographical errors that were not detected and corrected.    Brandy Hale, MD 9/18/20174:26 PM

## 2016-08-14 ENCOUNTER — Ambulatory Visit (INDEPENDENT_AMBULATORY_CARE_PROVIDER_SITE_OTHER): Payer: 59 | Admitting: Licensed Clinical Social Worker

## 2016-08-14 DIAGNOSIS — F411 Generalized anxiety disorder: Secondary | ICD-10-CM

## 2016-08-14 DIAGNOSIS — F331 Major depressive disorder, recurrent, moderate: Secondary | ICD-10-CM | POA: Diagnosis not present

## 2016-08-14 NOTE — Progress Notes (Signed)
   THERAPIST PROGRESS NOTE  Session Time: 11:05 AM to 11:55 AM  Participation Level: Active  Behavioral Response: CasualAlertEuthymic  Type of Therapy: Individual Therapy  Treatment Goals addressed: patient will manage stress better, establish better balance and a healthier lifestyle  Interventions: DBT, Solution Focused, Strength-based, Supportive and Other: Stress management, skills for interpersonal effectiveness  Summary: Shirley [name] is a 35 y.o. female who presents with relating that her mood is 8 out of 10 with 10 being the best. She feels she is doing better because she is "coping better" and dealing with things better. She admits to being a workaholic and does not take time off unless it is preplanned. Therapist pointed out that this quality of being a workaholic is contributing to her symptoms and why self-care and balance will help improve symptoms. She is going to take tomorrow off. Shared that she doesn't know how to do things for herself. Explored things she likes to do. She likes to read. She likes a series on TV. Reviewed things she could do tomorrow to have fun. Shared an example in one of her relationships where she is starting to set some boundaries and discussed how this practice encourages her to continue to take steps to do this in her relationships. Related though that she does not feel like she is accomplishing things at work and home. Things have been changing at work and hopefully things will settle down soon. We reviewed other strategies from plan she developed from last session and related that. she is praying more, looks from answers from God, and doing well in not picking up the phone. She shared she has 2 trips planned in October that she is looking forward to.  Suicidal/Homicidal: No  Therapist Response: Reviewed progress patient has made on place and she developed last session to not feel overwhelmed and set better boundaries. Provided positive feedback about  starting to take some steps to set boundaries, validated use of spirituality as a very helpful coping strategy and continue to encourage patient and self-care and pleasurable activities as a way to improve mood. Reviewed list of pleasurable activities so that patient could identify moments that she does or would be willing to do. Explained that it does not need to be big events but then full experiences in every day life that can bring joy. Suggested patient write down 3 things each day to bring some awareness of those movements in her day. Uncovered underlying causes of stress and identified patient as a workaholic. Discussed ways this resulted symptoms and that she can counter the negative impact of this quality by taking steps to establish balance.   Plan: Return again in 3 weeks.2. Patient engage in positive activities to help improve mood.3.patient work on Medical illustratorstress management techniques. 4. Patient practice balance and self-care.  Diagnosis: Axis I: major depressive disorder, recurrent, moderate, generalized anxiety disorder    Axis II: No diagnosis    Bowman,Mary A, LCSW 08/14/2016

## 2016-08-20 ENCOUNTER — Encounter: Payer: Self-pay | Admitting: Internal Medicine

## 2016-08-20 ENCOUNTER — Ambulatory Visit (INDEPENDENT_AMBULATORY_CARE_PROVIDER_SITE_OTHER): Payer: 59 | Admitting: Internal Medicine

## 2016-08-20 DIAGNOSIS — I10 Essential (primary) hypertension: Secondary | ICD-10-CM

## 2016-08-20 DIAGNOSIS — R809 Proteinuria, unspecified: Secondary | ICD-10-CM | POA: Diagnosis not present

## 2016-08-20 DIAGNOSIS — F439 Reaction to severe stress, unspecified: Secondary | ICD-10-CM | POA: Diagnosis not present

## 2016-08-20 DIAGNOSIS — E119 Type 2 diabetes mellitus without complications: Secondary | ICD-10-CM

## 2016-08-20 DIAGNOSIS — K219 Gastro-esophageal reflux disease without esophagitis: Secondary | ICD-10-CM

## 2016-08-20 DIAGNOSIS — J329 Chronic sinusitis, unspecified: Secondary | ICD-10-CM

## 2016-08-20 MED ORDER — AMOXICILLIN 875 MG PO TABS: 875.0000 mg | ORAL_TABLET | Freq: Two times a day (BID) | ORAL | 0 refills | Status: DC

## 2016-08-20 MED ORDER — LEVOCETIRIZINE DIHYDROCHLORIDE 5 MG PO TABS: 5.0000 mg | ORAL_TABLET | Freq: Every evening | ORAL | 1 refills | Status: AC

## 2016-08-20 MED ORDER — ALBUTEROL SULFATE HFA 108 (90 BASE) MCG/ACT IN AERS: 2.0000 | INHALATION_SPRAY | Freq: Four times a day (QID) | RESPIRATORY_TRACT | 1 refills | Status: AC | PRN

## 2016-08-20 MED ORDER — BECLOMETHASONE DIPROPIONATE 40 MCG/ACT IN AERS: 1.0000 | INHALATION_SPRAY | Freq: Two times a day (BID) | RESPIRATORY_TRACT | 1 refills | Status: DC

## 2016-08-20 MED ORDER — GLUCOSE BLOOD VI STRP: ORAL_STRIP | 11 refills | Status: DC

## 2016-08-20 NOTE — Patient Instructions (Signed)
Robitussin DM twice a day as needed.    If you take the antibiotic, take a probiotic daily while you are on the antibiotics and for two weeks after completing the antibiotic.

## 2016-08-20 NOTE — Progress Notes (Signed)
Patient ID: Shirley [name], female   DOB: [birth year], 35 y.o.   MRN: [id number]   Subjective:    Patient ID: Shirley [name], female    DOB: 03-04-1981, 35 y.o.   MRN: [id number]  HPI  Patient here for a scheduled follow up.  She reports that she started having symptoms over two weeks ago.  Started by losing her voice and sneezing.  No fever.  Sore throat.  Increased nasal congestion and yellow mucus production.  Some cough.  No diarrhea.  Eating and drinking.  Taking tylenol cold.  Seeing psychiatry.  On trazodone and celexa.  Doing better.  Increased stress at work.  Handling things ok.     Past Medical History:  Diagnosis Date  . Anemia   . Asthma   . Complication of anesthesia    itching after surgery 2005, 2006  . Depression   . Diabetes mellitus ([place])   . Fatty liver   . GERD (gastroesophageal reflux disease)   . Hypertension   . IBS (irritable bowel syndrome)   . Migraine   . PONV (postoperative nausea and vomiting)    nausea   Past Surgical History:  Procedure Laterality Date  . CHOLECYSTECTOMY  2005  . LAPAROSCOPIC GASTRIC RESTRICTIVE DUODENAL PROCEDURE (DUODENAL SWITCH) N/A 08/28/2015   Procedure: LAPAROSCOPIC GASTRIC RESTRICTIVE DUODENAL PROCEDURE (DUODENAL SWITCH);  Surgeon: Ladora Daniel, MD;  Location: [facility] ORS;  Service: General;  Laterality: N/A;  . 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 Hypertension Mother   . Diabetes Mother   . Asthma Brother   . Breast cancer Neg Hx   . Colon cancer Neg Hx    Social History   Social History  . Marital status: Single    Spouse name: N/A  . Number of children: 0  . Years of education: N/A   Occupational History  .  [employer]   Social History Main Topics  . Smoking status: Never Smoker  . Smokeless tobacco: Never Used  . Alcohol use No     Comment: social  . Drug use: No  . Sexual activity: Not Currently   Other Topics Concern  . None   Social History  Narrative  . None    Outpatient Encounter Prescriptions as of 08/20/2016  Medication Sig  . albuterol (PROVENTIL HFA;VENTOLIN HFA) 108 (90 Base) MCG/ACT inhaler Inhale 2 puffs into the lungs every 6 (six) hours as needed for wheezing or shortness of breath. Reported on 05/01/2016  . azelastine (OPTIVAR) 0.05 % ophthalmic solution 1 drop as needed. Reported on 05/01/2016  . citalopram (CELEXA) 20 MG tablet Take 1 tablet (20 mg total) by mouth daily.  Marland Kitchen glucose blood (BAYER CONTOUR NEXT TEST) test strip Use 1 strip two times daily.  Marland Kitchen levocetirizine (XYZAL) 5 MG tablet Take 1 tablet (5 mg total) by mouth every evening. Reported on 05/01/2016  . Olopatadine HCl (PATANASE) 0.6 % SOLN Place into the nose as needed. Reported on 05/01/2016  . traZODone (DESYREL) 100 MG tablet Take 1 tablet (100 mg total) by mouth at bedtime.  . Vitamin D, Ergocalciferol, (DRISDOL) 50000 units CAPS capsule Take 1 capsule (50,000 Units total) by mouth every 7 (seven) days.  . [DISCONTINUED] albuterol (PROVENTIL HFA;VENTOLIN HFA) 108 (90 BASE) MCG/ACT inhaler Inhale into the lungs every 6 (six) hours as needed for wheezing or shortness of breath. Reported on 05/01/2016  . [DISCONTINUED] beclomethasone (QVAR) 40 MCG/ACT inhaler Inhale 1  puff into the lungs as needed. Reported on 05/01/2016  . [DISCONTINUED] beclomethasone (QVAR) 40 MCG/ACT inhaler Inhale 1 puff into the lungs 2 (two) times daily. Reported on 05/01/2016  . [DISCONTINUED] glucose blood (BAYER CONTOUR NEXT TEST) test strip Use 1 strip two times daily.  . [DISCONTINUED] levocetirizine (XYZAL) 5 MG tablet Take 5 mg by mouth every evening. Reported on 05/01/2016  . amoxicillin (AMOXIL) 875 MG tablet Take 1 tablet (875 mg total) by mouth 2 (two) times daily.   No facility-administered encounter medications on file as of 08/20/2016.     Review of Systems  Constitutional: Negative for appetite change and unexpected weight change.  HENT: Positive for congestion, postnasal drip,  sinus pressure and sore throat.   Respiratory: Positive for cough. Negative for chest tightness and shortness of breath.   Cardiovascular: Negative for chest pain, palpitations and leg swelling.  Gastrointestinal: Negative for abdominal pain, diarrhea, nausea and vomiting.  Genitourinary: Negative for difficulty urinating and dysuria.  Musculoskeletal: Negative for back pain and joint swelling.  Skin: Negative for color change and rash.  Neurological: Negative for dizziness, light-headedness and headaches.  Psychiatric/Behavioral: Negative for agitation and dysphoric mood.       Objective:    Physical Exam  Constitutional: She appears well-developed and well-nourished. No distress.  HENT:  Mouth/Throat: Oropharynx is clear and moist.  Slightly erythematous turbinates.  Minimal tenderness to palpation over the sinuses.    Neck: Neck supple. No thyromegaly present.  Cardiovascular: Normal rate and regular rhythm.   Pulmonary/Chest: Breath sounds normal. No respiratory distress. She has no wheezes.  Abdominal: Soft. Bowel sounds are normal. There is no tenderness.  Musculoskeletal: She exhibits no edema or tenderness.  Lymphadenopathy:    She has no cervical adenopathy.  Skin: No rash noted. No erythema.  Psychiatric: She has a normal mood and affect. Her behavior is normal.    BP 106/72   Pulse 72   Temp 98.5 F (36.9 C) (Oral)   Ht '5\' 1"'$  (1.549 m)   Wt 218 lb 12.8 oz (99.2 kg)   LMP 08/03/2016 (Exact Date)   SpO2 98%   BMI 41.34 kg/m  Wt Readings from Last 3 Encounters:  08/20/16 218 lb 12.8 oz (99.2 kg)  08/11/16 215 lb 9.6 oz (97.8 kg)  07/31/16 217 lb (98.4 kg)     Lab Results  Component Value Date   WBC 5.0 05/01/2016   HGB 12.1 09/02/2015   HCT 40.1 05/01/2016   PLT 198 05/01/2016   GLUCOSE 98 05/01/2016   CHOL 129 05/01/2016   TRIG 67 05/01/2016   HDL 62 05/01/2016   LDLCALC 54 05/01/2016   ALT 33 (H) 05/01/2016   AST 25 05/01/2016   NA 141 05/01/2016     K 4.5 05/01/2016   CL 102 05/01/2016   CREATININE 0.63 05/01/2016   BUN 10 05/01/2016   CO2 22 05/01/2016   TSH 1.120 08/10/2015   HGBA1C 6.0 07/31/2016    Mm Digital Diagnostic Bilat  Result Date: 03/25/2016 CLINICAL DATA:  Recall from baseline screening mammogram. The patient does have a history of chronic bilateral axillary and inframammary fold episodic dermal draining lesions consistent with mild Hidradenitis Suppurativa. EXAM: 2D DIGITAL DIAGNOSTIC BILATERAL MAMMOGRAM WITH CAD AND ADJUNCT TOMO COMPARISON:  02/18/2016 baseline mammogram ACR Breast Density Category b: There are scattered areas of fibroglandular density. FINDINGS: Additional views of the axillary portions of the breasts demonstrate bilateral skin calcifications which are associated with the patient's bilateral localized inflammatory dermal changes.  There are no findings worrisome for malignancy. Mammographic images were processed with CAD. IMPRESSION: The calcifications within the axillary regions are dermal. No findings worrisome for malignancy. RECOMMENDATION: Bilateral screening mammography at age 37 or earlier felt to be clinically indicated. I have discussed the findings and recommendations with the patient. Results were also provided in writing at the conclusion of the visit. If applicable, a reminder letter will be sent to the patient regarding the next appointment. BI-RADS CATEGORY  2: Benign. Electronically Signed   By: Altamese Cabal M.D.   On: 03/25/2016 15:29       Assessment & Plan:   Problem List Items Addressed This Visit    Diabetes mellitus ([place])    Off medication.  Follow met b and a1c.  Has lost weight.        GERD (gastroesophageal reflux disease)    No upper symptoms.        Hypertension    Blood pressure under good control.  Continue same medication regimen.  Follow pressures.  Follow metabolic panel.        Microalbuminuria    Off medication.  Doing well.  Follow.        Severe obesity  (BMI >= 40) (HCC)    Has lost weight.  Continues with diet adjustment.  Follow.        Sinusitis    Sinusitis/uri.  Treat with saline nasal spray and nasacort nasal spray as directed.  Robitussin DM.  abx prescribed if symptoms worsen or do not resolve.  Follow.       Relevant Medications   levocetirizine (XYZAL) 5 MG tablet   amoxicillin (AMOXIL) 875 MG tablet   Stress    Increased stress with work, Social research officer, government.  Seeing psychiatry.  On trazodone and celexa.  Stable.         Other Visit Diagnoses   None.      Einar Pheasant, MD

## 2016-08-20 NOTE — Progress Notes (Signed)
Pre visit review using our clinic review tool, if applicable. No additional management support is needed unless otherwise documented below in the visit note. 

## 2016-08-27 ENCOUNTER — Other Ambulatory Visit: Payer: Self-pay

## 2016-08-27 ENCOUNTER — Encounter: Payer: Self-pay | Admitting: Internal Medicine

## 2016-08-27 MED ORDER — BECLOMETHASONE DIPROPIONATE 40 MCG/ACT IN AERS: 1.0000 | INHALATION_SPRAY | Freq: Two times a day (BID) | RESPIRATORY_TRACT | 1 refills | Status: DC

## 2016-08-27 NOTE — Telephone Encounter (Signed)
Please clarify with pharmacy.  It is showing the qvar was sent in on 08/20/16.  Let pt know if there.  Let me know if problem.  Ok to refill x 2.

## 2016-08-31 ENCOUNTER — Encounter: Payer: Self-pay | Admitting: Internal Medicine

## 2016-08-31 DIAGNOSIS — J329 Chronic sinusitis, unspecified: Secondary | ICD-10-CM | POA: Insufficient documentation

## 2016-08-31 NOTE — Assessment & Plan Note (Signed)
Sinusitis/uri.  Treat with saline nasal spray and nasacort nasal spray as directed.  Robitussin DM.  abx prescribed if symptoms worsen or do not resolve.  Follow.

## 2016-08-31 NOTE — Assessment & Plan Note (Signed)
No upper symptoms.   

## 2016-08-31 NOTE — Assessment & Plan Note (Signed)
Off medication.  Doing well.  Follow.

## 2016-08-31 NOTE — Assessment & Plan Note (Signed)
Has lost weight.  Continues with diet adjustment.  Follow.

## 2016-08-31 NOTE — Assessment & Plan Note (Signed)
Blood pressure under good control.  Continue same medication regimen.  Follow pressures.  Follow metabolic panel.   

## 2016-08-31 NOTE — Assessment & Plan Note (Signed)
Off medication.  Follow met b and a1c.  Has lost weight.

## 2016-08-31 NOTE — Assessment & Plan Note (Signed)
Increased stress with work, Catering manageretc.  Seeing psychiatry.  On trazodone and celexa.  Stable.

## 2016-09-04 ENCOUNTER — Ambulatory Visit: Payer: 59 | Admitting: Licensed Clinical Social Worker

## 2016-09-05 NOTE — Telephone Encounter (Signed)
See my chart message.  Please call pt and notify her that I have completed the form.  (placed in your box).  Also, notify her to let us know if continues to have any problems with cough/congestoin.

## 2016-09-10 ENCOUNTER — Ambulatory Visit (INDEPENDENT_AMBULATORY_CARE_PROVIDER_SITE_OTHER): Payer: 59 | Admitting: Licensed Clinical Social Worker

## 2016-09-10 DIAGNOSIS — F411 Generalized anxiety disorder: Secondary | ICD-10-CM | POA: Diagnosis not present

## 2016-09-10 DIAGNOSIS — F331 Major depressive disorder, recurrent, moderate: Secondary | ICD-10-CM | POA: Diagnosis not present

## 2016-09-10 NOTE — Progress Notes (Signed)
   THERAPIST PROGRESS NOTE  Session Time: 2 PM to 2:52 PM  Participation Level: Active  Behavioral Response: CasualAlertEuthymic  Type of Therapy: Individual Therapy  Treatment Goals addressed:  patient will manage stress better, establish better balance and a healthier lifestyle  Interventions: Solution Focused, Strength-based, Supportive and Other: Stress management, skills to build self-esteem  Summary: Shirley [name] is a 35 y.o. female who presents with things being the same. She was able to sleep better with medications. She is down and may be related to menstrual cycle. Describe her feeling as "blah" and doesn't feel like doing anything. She is so far behind at work. She was not able to rest on vacation with others, and still not able to say no in her relationships. around at work. Discussed with therapist that recognition of value and self-respect is being able to say no, ability to receive, in order to practice self-care so that you can look after yourself. In this way she can give generously from a place of abundance instead of running on empty. Patient unable to give herself complements when when asked by therapist and identified underlying beliefs that in order to be people have to be happy with what she gives. Talk to her weight loss doctor and he pointed out that she's not treating herself right. Patient struggles to say no and recognizes she gets value from others and worsen what she does.    Suicidal/Homicidal: No  Therapist Response: Identified schema patient has of needing to do something for others to find value in herself. Identified patient's need to work on finding value separate from externals and displayed through taking care of herself. At that ability to receive help, saying no indicate self-respect and recognition of value. In taking care of self you can give from a place of dependence and sedative Place of running on empty because of you not being enough to go around.  Discussed stress management strategies related to her not taking care for herself and her need to find her own thing that will help. Identified  spirituality as one source. Discussed self-esteem comes from an unconditional sense of self-worth and discussed strategies she can begin to implement to help her improve her self-esteem. By did supportive and strength-based interventions. Discussed with patient the necessity of changing schemas and developing an alternative and healthier perspective of self.    Plan: Return again in 2 weeks.2. Patient read handout titled "the basics of human worth"3. Patient to give herself one complimentary day and identify 3 things she is grateful for  Diagnosis: Axis I:  major depressive disorder, recurrent, moderate, generalized anxiety disorder    Axis II: No diagnosis    Chantelle Verdi A, LCSW 09/10/2016

## 2016-09-11 ENCOUNTER — Ambulatory Visit: Payer: Self-pay | Admitting: Psychiatry

## 2016-09-19 ENCOUNTER — Ambulatory Visit: Payer: 59 | Admitting: Psychiatry

## 2016-09-29 LAB — HM DIABETES EYE EXAM

## 2016-09-30 ENCOUNTER — Encounter: Payer: Self-pay | Admitting: Internal Medicine

## 2016-10-02 ENCOUNTER — Ambulatory Visit (INDEPENDENT_AMBULATORY_CARE_PROVIDER_SITE_OTHER): Payer: 59 | Admitting: Licensed Clinical Social Worker

## 2016-10-02 DIAGNOSIS — F411 Generalized anxiety disorder: Secondary | ICD-10-CM

## 2016-10-02 DIAGNOSIS — F331 Major depressive disorder, recurrent, moderate: Secondary | ICD-10-CM | POA: Diagnosis not present

## 2016-10-02 NOTE — Progress Notes (Signed)
d  THERAPIST PROGRESS NOTE  Session Time: 9:06 AM to 1055  Participation Level: Active  Behavioral Response: CasualAlertEuthymic  Type of Therapy: Individual Therapy  Treatment Goals addressed: patient will manage stress better, establish better balance and a healthier lifestyle  Interventions: Strength-based, Supportive and Other: Skills to build self-esteem, problem solving  Summary: Shirley [name] is a [age] y.o. female who presents with crying Sunday. She wonders what people think of her when the only call when they need something from her. Last session that her as it brought her to the place of seeing herself. She is not taking care of herself and not sure how. She feels overwhelmed with her stressors. She is struggling financially, the heater broke down, and not making sure to watch what she eats. See wants to see herself in a different light. Reviewed worksheet titled "Regarding Your Core Value" and patient was able to identify personality traits to reveal unique personal portrait of attributes at various stages of development and the unique ways her worth is expressed. Patient sees that her heart would be where she shines brightest. This exercise also helped patient to discuss that she has potential but that has to grow and change to fulfill her potential. She identified wanting to see herself different and discussed how change and growth will help her with this process.   Suicidal/Homicidal: No  Therapist Response: Reviewed progress and current symptoms. Encouraged patient to set boundaries in relationships to start to take care of herself and reinforced to her that she is a priority. We reviewed worksheet titled "regarding your coure worth" to help patient and working on self-esteem and recognizing her value. Reinforced and taking steps to take care of herself as signs of recognizing her value.Encouraged patient with change and growth in working on fulfilling her potential, helping het to  see herself different and also in working on self-esteem. Used problem solving to help patient in working stressors and used strength based and supportive interventions.   Plan: Return again in 2 weeks.2. Patient continue to work on setting boundaries and self-care  Diagnosis: Axis I: major depressive disorder, recurrent, moderate, generalized anxiety disorder    Axis II: no    Bowman,Mary A, LCSW 10/02/2016

## 2016-10-10 ENCOUNTER — Ambulatory Visit: Payer: Self-pay | Admitting: Psychiatry

## 2016-10-10 ENCOUNTER — Encounter: Payer: Self-pay | Admitting: Internal Medicine

## 2016-10-21 ENCOUNTER — Ambulatory Visit (INDEPENDENT_AMBULATORY_CARE_PROVIDER_SITE_OTHER): Payer: 59 | Admitting: Psychiatry

## 2016-10-21 ENCOUNTER — Encounter: Payer: Self-pay | Admitting: Psychiatry

## 2016-10-21 VITALS — BP 140/84 | HR 109 | Temp 98.7°F | Resp 16 | Wt 219.0 lb

## 2016-10-21 DIAGNOSIS — F411 Generalized anxiety disorder: Secondary | ICD-10-CM

## 2016-10-21 DIAGNOSIS — F331 Major depressive disorder, recurrent, moderate: Secondary | ICD-10-CM

## 2016-10-21 MED ORDER — CITALOPRAM HYDROBROMIDE 20 MG PO TABS: 20.0000 mg | ORAL_TABLET | Freq: Every day | ORAL | 1 refills | Status: DC

## 2016-10-21 MED ORDER — TRAZODONE HCL 100 MG PO TABS: 100.0000 mg | ORAL_TABLET | Freq: Every day | ORAL | 1 refills | Status: DC

## 2016-10-21 NOTE — Progress Notes (Signed)
Psychiatric MD Progress Note  Patient Identification: Shirley [name] MRN:  [id number] Date of Evaluation:  10/21/2016 Referral Source: Corrie DandyMary- Therapist Chief Complaint:   Chief Complaint    Follow-up     Visit Diagnosis:    ICD-9-CM ICD-10-CM   1. Major depressive disorder, recurrent episode, moderate (HCC) 296.32 F33.1   2. Generalized anxiety disorder 300.02 F41.1     History of Present Illness:   Patient is a 35 year old female who presented for follow up.  She reported that she has started improving on her medications. She is sleeping well with the help of trazodone. She reported that she does not have interrupted sleep at night. She felt that the Celexa has been helping her and she takes it in the morning. She is not having any side effects of medication. She still works long hours during the daytime. She reported that she wants to continue the medication at this time. She is doing therapy with Corrie DandyMary on a regular basis. Patient appeared calm and alert during the interview. She currently denied having any suicidal ideations or plans. She reported that her medications are helpful and she wants to continue them as prescribed and will discuss about adjusting the dose of the medications at the last appointment.  Planning to go to [place] before the holidays. She enjoys watching television at home. She denied having any suicidal ideation or plan. She was calm and cooperative during the interview.    Associated Signs/Symptoms: Depression Symptoms:  fatigue, feelings of worthlessness/guilt, anxiety, loss of energy/fatigue, disturbed sleep, decreased appetite, (Hypo) Manic Symptoms:  Labiality of Mood, Anxiety Symptoms:  Excessive Worry, Psychotic Symptoms:  none PTSD Symptoms: Negative NA  Past Psychiatric History:  H/o depression. She has tried several medications in the past. She reported that the medications are not effective at this time. No h/o suicide attempts.    Previous Psychotropic Medications:  Ambien  Melatonin Celexa Ativan Zoloft   Substance Abuse History in the last 12 months:  Yes.   Social drinker  Consequences of Substance Abuse: Negative NA  Past Medical History:  Past Medical History:  Diagnosis Date  . Anemia   . Asthma   . Complication of anesthesia    itching after surgery 2005, 2006  . Depression   . Diabetes mellitus (HCC)   . Fatty liver   . GERD (gastroesophageal reflux disease)   . Hypertension   . IBS (irritable bowel syndrome)   . Migraine   . PONV (postoperative nausea and vomiting)    nausea    Past Surgical History:  Procedure Laterality Date  . CHOLECYSTECTOMY  2005  . LAPAROSCOPIC GASTRIC RESTRICTIVE DUODENAL PROCEDURE (DUODENAL SWITCH) N/A 08/28/2015   Procedure: LAPAROSCOPIC GASTRIC RESTRICTIVE DUODENAL PROCEDURE (DUODENAL SWITCH);  Surgeon: Everette RankMichael A Tyner, MD;  Location: [facility] ORS;  Service: General;  Laterality: N/A;  . 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 Hypertension Mother   . Diabetes Mother   . Asthma Brother   . Breast cancer Neg Hx   . Colon cancer Neg Hx     Social History:   Social History   Social History  . Marital status: Single    Spouse name: N/A  . Number of children: 0  . Years of education: N/A   Occupational History  .  [employer]   Social History Main Topics  . Smoking status: Never Smoker  .  Smokeless tobacco: Never Used  . Alcohol use No     Comment: social  . Drug use: No  . Sexual activity: Not Currently   Other Topics Concern  . None   Social History Narrative  . 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  [facility]- last Oct- lost 50 lbs since then.   Lives with cousin. Never married, no children.  [place]    Allergies:   Allergies  Allergen Reactions  . Caffeine  Other (See Comments)    Stomach issues  . Ibuprofen Other (See Comments)    Upset stomach    Metabolic Disorder Labs: Lab Results  Component Value Date   HGBA1C 6.0 07/31/2016   No results found for: PROLACTIN Lab Results  Component Value Date   CHOL 129 05/01/2016   TRIG 67 05/01/2016   HDL 62 05/01/2016   LDLCALC 54 05/01/2016   LDLCALC 47 03/21/2016     Current Medications: Current Outpatient Prescriptions  Medication Sig Dispense Refill  . albuterol (PROVENTIL HFA;VENTOLIN HFA) 108 (90 Base) MCG/ACT inhaler Inhale 2 puffs into the lungs every 6 (six) hours as needed for wheezing or shortness of breath. Reported on 05/01/2016 1 Inhaler 1  . azelastine (OPTIVAR) 0.05 % ophthalmic solution 1 drop as needed. Reported on 05/01/2016    . beclomethasone (QVAR) 40 MCG/ACT inhaler Inhale 1 puff into the lungs 2 (two) times daily. Reported on 05/01/2016 1 Inhaler 1  . citalopram (CELEXA) 20 MG tablet Take 1 tablet (20 mg total) by mouth daily. 30 tablet 1  . citalopram (CELEXA) 20 MG tablet Take 1 tablet (20 mg total) by mouth daily. 90 tablet 1  . glucose blood (BAYER CONTOUR NEXT TEST) test strip Use 1 strip two times daily. 200 each 11  . levocetirizine (XYZAL) 5 MG tablet Take 1 tablet (5 mg total) by mouth every evening. Reported on 05/01/2016 30 tablet 1  . Olopatadine HCl (PATANASE) 0.6 % SOLN Place into the nose as needed. Reported on 05/01/2016    . traZODone (DESYREL) 100 MG tablet Take 1 tablet (100 mg total) by mouth at bedtime. 90 tablet 1  . traZODone (DESYREL) 100 MG tablet Take 1 tablet (100 mg total) by mouth at bedtime. 30 tablet 1  . Vitamin D, Ergocalciferol, (DRISDOL) 50000 units CAPS capsule Take 1 capsule (50,000 Units total) by mouth every 7 (seven) days. 4 capsule 2   No current facility-administered medications for this visit.     Neurologic: Headache: No Seizure: No Paresthesias:No  Musculoskeletal: Strength & Muscle Tone: within normal limits Gait & Station:  normal Patient leans: N/A  Psychiatric Specialty Exam: Review of Systems  Psychiatric/Behavioral: Positive for depression. The patient has insomnia.   All other systems reviewed and are negative.   Blood pressure 140/84, pulse (!) 109, temperature 98.7 F (37.1 C), temperature source Tympanic, resp. rate 16, weight 219 lb (99.3 kg), SpO2 98 %.Body mass index is 41.38 kg/m.  General Appearance: Casual  Eye Contact:  Fair  Speech:  Clear and Coherent  Volume:  Normal  Mood:  Anxious  Affect:  Congruent  Thought Process:  Coherent  Orientation:  Full (Time, Place, and Person)  Thought Content:  Logical  Suicidal Thoughts:  No  Homicidal Thoughts:  No  Memory:  Immediate;   Fair Recent;   Fair Remote;   Fair  Judgement:  Fair  Insight:  Fair  Psychomotor Activity:  Normal  Concentration:  Concentration: Fair and Attention Span: Fair  Recall:  [employer] of Knowledge:Fair  Language: Fair  Akathisia:  No  Handed:  Right  AIMS (if indicated):    Assets:  Communication Skills Desire for Improvement Physical Health Social Support  ADL's:  Intact  Cognition: WNL  Sleep:  poor    Treatment Plan Summary: Medication management    Discussed with patient what the medications treatment risk benefits and alternatives. Continue  trazodone 100 mg qhs  She demonstrated understanding. Continue  Celexa to 20 mg daily.  She was given a local prescription as well as the mail order prescription of the medications.  Discussed with her about side effects of the medication she demonstrated understanding  Follow-up in one month or earlier depending on her symptoms   More than 50% of the time spent in psychoeducation, counseling and coordination of care.    This note was generated in part or whole with voice recognition software. Voice regonition is usually quite accurate but there are transcription errors that can and very often do occur. I apologize for any typographical errors that  were not detected and corrected.    Brandy Hale, MD 11/28/20173:41 PM

## 2016-10-22 ENCOUNTER — Ambulatory Visit (INDEPENDENT_AMBULATORY_CARE_PROVIDER_SITE_OTHER): Payer: 59 | Admitting: Licensed Clinical Social Worker

## 2016-10-22 DIAGNOSIS — F411 Generalized anxiety disorder: Secondary | ICD-10-CM | POA: Diagnosis not present

## 2016-10-22 DIAGNOSIS — F331 Major depressive disorder, recurrent, moderate: Secondary | ICD-10-CM | POA: Diagnosis not present

## 2016-10-22 NOTE — Progress Notes (Signed)
   THERAPIST PROGRESS NOTE  Session Time: 4:05 PM to 4:55 PM  Participation Level: Active  Behavioral Response: CasualAlertEuthymic  Type of Therapy: Individual Therapy  Treatment Goals addressed:  patient will manage stress better, establish better balance and a healthier lifestyle  Interventions: Solution Focused, Strength-based, Supportive and Other: Stress management  Summary: Shirley [name] is a 35 y.o. female who presents with update with symptoms and relates that things are better and making progress. She has figured out not to feel so guilty when people make demands on her and saying now. She has learned not to care and not get worked up as much. Recently she has less demands from her family and that has helped her have more time for herself. Updated therapist on her work situation and things have improved as she has a Writernew manager and more support. She also applied for another position, got an offer, and when her work found out she was going to leave offered another position at work. She has to figure out her decision but recognizes this as a positive feedback about her skills and performance. Finances continue to be a stressor. Recognizes that in the past she was too stressed out that caused her to be anhedonic and not take care of herself. She has more energy now. Shared that cutting her hair has made her feel better and things are better in her life. Reviewed treatment goals and wants to continue to take positive steps to create healthy balance in her life.      Suicidal/Homicidal: No  Therapist Response: Reviewed progress in symptoms. Identified decrease in stress at work and less demands by family have helped with her progress. Also, identified patient has developed better stress management strategies. Identified positive developments related to work and and  self-care activities as helpful. Reviewed treatment goals and patient will continue to work on creating healthy balance in  her life and improvement of psychiatric symptoms.  Plan: Return again in 4 weeks.2. He should continue to gain insight and take positive steps to create healthy balance in her life.  Diagnosis: Axis I: major depressive disorder, recurrent, moderate, generalized anxiety disorder    Axis II: No diagnosis    Bowman,Mary A, LCSW 10/22/2016

## 2016-10-29 ENCOUNTER — Encounter: Payer: Self-pay | Admitting: Internal Medicine

## 2016-10-29 DIAGNOSIS — M79601 Pain in right arm: Secondary | ICD-10-CM

## 2016-10-30 NOTE — Telephone Encounter (Signed)
Order placed for ortho referral.   

## 2016-11-10 ENCOUNTER — Ambulatory Visit
Admission: RE | Admit: 2016-11-10 | Discharge: 2016-11-10 | Disposition: A | Discharge status: Home / Self Care | Payer: 59 | Source: Ambulatory Visit | Attending: Internal Medicine | Admitting: Internal Medicine

## 2016-11-10 ENCOUNTER — Ambulatory Visit (INDEPENDENT_AMBULATORY_CARE_PROVIDER_SITE_OTHER): Payer: 59 | Admitting: Internal Medicine

## 2016-11-10 ENCOUNTER — Encounter: Payer: Self-pay | Admitting: Internal Medicine

## 2016-11-10 VITALS — BP 122/84 | HR 70 | Temp 98.4°F | Ht 61.0 in | Wt 220.0 lb

## 2016-11-10 DIAGNOSIS — X58XXXA Exposure to other specified factors, initial encounter: Secondary | ICD-10-CM | POA: Insufficient documentation

## 2016-11-10 DIAGNOSIS — E559 Vitamin D deficiency, unspecified: Secondary | ICD-10-CM | POA: Diagnosis not present

## 2016-11-10 DIAGNOSIS — E119 Type 2 diabetes mellitus without complications: Secondary | ICD-10-CM

## 2016-11-10 DIAGNOSIS — S0990XA Unspecified injury of head, initial encounter: Secondary | ICD-10-CM

## 2016-11-10 DIAGNOSIS — Z9889 Other specified postprocedural states: Secondary | ICD-10-CM

## 2016-11-10 DIAGNOSIS — R51 Headache: Secondary | ICD-10-CM

## 2016-11-10 DIAGNOSIS — M25511 Pain in right shoulder: Secondary | ICD-10-CM

## 2016-11-10 DIAGNOSIS — R519 Headache, unspecified: Secondary | ICD-10-CM

## 2016-11-10 DIAGNOSIS — F439 Reaction to severe stress, unspecified: Secondary | ICD-10-CM | POA: Diagnosis not present

## 2016-11-10 NOTE — Progress Notes (Addendum)
Patient ID: Shirley [name], female   DOB: 07-May-1981, 35 y.o.   MRN: [id number]   Subjective:    Patient ID: Shirley [name], female    DOB: 1981/07/02, 35 y.o.   MRN: [id number]  HPI  Patient here for a scheduled follow up.  She reports she was sweeping.  Raise up and hit her head hard on a marble counter.  She has felt a little light headed since.  Persistent headache.  She is eating.  No vomiting.  No abdominal pain or cramping.  Bowels stable.  She is not exercising and not watching her diet as well.  We discussed the need for her to get back into her routine.  She also reports persistent right shoulder pain.  Increased pain.  Hurts worse if reaching forward.  Some tingling in her arm.  Pain increasing.  She previously bowled.  Has not been bowling recently.  Has noticed losing grip at times.     Past Medical History:  Diagnosis Date  . Anemia   . Asthma   . Complication of anesthesia    itching after surgery 2005, 2006  . Depression   . Diabetes mellitus ([place])   . Fatty liver   . GERD (gastroesophageal reflux disease)   . Hypertension   . IBS (irritable bowel syndrome)   . Migraine   . PONV (postoperative nausea and vomiting)    nausea   Past Surgical History:  Procedure Laterality Date  . CHOLECYSTECTOMY  2005  . LAPAROSCOPIC GASTRIC RESTRICTIVE DUODENAL PROCEDURE (DUODENAL SWITCH) N/A 08/28/2015   Procedure: LAPAROSCOPIC GASTRIC RESTRICTIVE DUODENAL PROCEDURE (DUODENAL SWITCH);  Surgeon: Ladora Daniel, MD;  Location: [facility] ORS;  Service: General;  Laterality: N/A;  . 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 Hypertension Mother   . Diabetes Mother   . Asthma Brother   . Breast cancer Neg Hx   . Colon cancer Neg Hx    Social History   Social History  . Marital status: Single    Spouse name: N/A  . Number of children: 0  . Years of education: N/A   Occupational History  .  [employer]   Social History Main Topics  .  Smoking status: Never Smoker  . Smokeless tobacco: Never Used  . Alcohol use No     Comment: social  . Drug use: No  . Sexual activity: Not Currently   Other Topics Concern  . None   Social History Narrative  . None    Outpatient Encounter Prescriptions as of 11/10/2016  Medication Sig  . albuterol (PROVENTIL HFA;VENTOLIN HFA) 108 (90 Base) MCG/ACT inhaler Inhale 2 puffs into the lungs every 6 (six) hours as needed for wheezing or shortness of breath. Reported on 05/01/2016  . azelastine (OPTIVAR) 0.05 % ophthalmic solution 1 drop as needed. Reported on 05/01/2016  . beclomethasone (QVAR) 40 MCG/ACT inhaler Inhale 1 puff into the lungs 2 (two) times daily. Reported on 05/01/2016  . citalopram (CELEXA) 20 MG tablet Take 1 tablet (20 mg total) by mouth daily.  . citalopram (CELEXA) 20 MG tablet Take 1 tablet (20 mg total) by mouth daily.  Marland Kitchen glucose blood (BAYER CONTOUR NEXT TEST) test strip Use 1 strip two times daily.  Marland Kitchen levocetirizine (XYZAL) 5 MG tablet Take 1 tablet (5 mg total) by mouth every evening. Reported on 05/01/2016  . Olopatadine HCl (PATANASE) 0.6 % SOLN Place into the nose as needed. Reported  on 05/01/2016  . traZODone (DESYREL) 100 MG tablet Take 1 tablet (100 mg total) by mouth at bedtime.  . traZODone (DESYREL) 100 MG tablet Take 1 tablet (100 mg total) by mouth at bedtime.  . Vitamin D, Ergocalciferol, (DRISDOL) 50000 units CAPS capsule Take 1 capsule (50,000 Units total) by mouth every 7 (seven) days.   No facility-administered encounter medications on file as of 11/10/2016.     Review of Systems  Constitutional: Negative for appetite change and unexpected weight change.  HENT: Negative for congestion and sinus pressure.   Respiratory: Negative for cough, chest tightness and shortness of breath.   Cardiovascular: Negative for chest pain, palpitations and leg swelling.  Gastrointestinal: Negative for abdominal pain, diarrhea, nausea and vomiting.  Genitourinary: Negative  for difficulty urinating and dysuria.  Musculoskeletal: Negative for joint swelling.       Right shoulder pain as outlined.    Skin: Negative for color change and rash.  Neurological: Positive for light-headedness and headaches.  Psychiatric/Behavioral: Negative for agitation and dysphoric mood.       Objective:    Physical Exam  Constitutional: She appears well-developed and well-nourished. No distress.  HENT:  Nose: Nose normal.  Mouth/Throat: Oropharynx is clear and moist.  Neck: Neck supple. No thyromegaly present.  Cardiovascular: Normal rate and regular rhythm.   Pulmonary/Chest: Breath sounds normal. No respiratory distress. She has no wheezes.  Abdominal: Soft. Bowel sounds are normal. There is no tenderness.  Musculoskeletal: She exhibits no edema or tenderness.  Increased pain in right shoulder - worse with reaching forward.    Lymphadenopathy:    She has no cervical adenopathy.  Skin: No rash noted. No erythema.  Psychiatric: She has a normal mood and affect. Her behavior is normal.    BP 122/84   Pulse 70   Temp 98.4 F (36.9 C) (Oral)   Ht 5' 1" (1.549 m)   Wt 220 lb (99.8 kg)   LMP 11/03/2016   SpO2 98%   BMI 41.57 kg/m  Wt Readings from Last 3 Encounters:  11/10/16 220 lb (99.8 kg)  08/20/16 218 lb 12.8 oz (99.2 kg)  07/31/16 217 lb (98.4 kg)     Lab Results  Component Value Date   WBC 5.0 05/01/2016   HGB 12.1 09/02/2015   HCT 40.1 05/01/2016   PLT 198 05/01/2016   GLUCOSE 98 05/01/2016   CHOL 129 05/01/2016   TRIG 67 05/01/2016   HDL 62 05/01/2016   LDLCALC 54 05/01/2016   ALT 33 (H) 05/01/2016   AST 25 05/01/2016   NA 141 05/01/2016   K 4.5 05/01/2016   CL 102 05/01/2016   CREATININE 0.63 05/01/2016   BUN 10 05/01/2016   CO2 22 05/01/2016   TSH 1.120 08/10/2015   HGBA1C 6.0 07/31/2016    Mm Digital Diagnostic Bilat  Result Date: 03/25/2016 CLINICAL DATA:  Recall from baseline screening mammogram. The patient does have a history of  chronic bilateral axillary and inframammary fold episodic dermal draining lesions consistent with mild Hidradenitis Suppurativa. EXAM: 2D DIGITAL DIAGNOSTIC BILATERAL MAMMOGRAM WITH CAD AND ADJUNCT TOMO COMPARISON:  02/18/2016 baseline mammogram ACR Breast Density Category b: There are scattered areas of fibroglandular density. FINDINGS: Additional views of the axillary portions of the breasts demonstrate bilateral skin calcifications which are associated with the patient's bilateral localized inflammatory dermal changes. There are no findings worrisome for malignancy. Mammographic images were processed with CAD. IMPRESSION: The calcifications within the axillary regions are dermal. No findings worrisome for malignancy.  RECOMMENDATION: Bilateral screening mammography at age 38 or earlier felt to be clinically indicated. I have discussed the findings and recommendations with the patient. Results were also provided in writing at the conclusion of the visit. If applicable, a reminder letter will be sent to the patient regarding the next appointment. BI-RADS CATEGORY  2: Benign. Electronically Signed   By: Altamese Cabal M.D.   On: 03/25/2016 15:29       Assessment & Plan:   Problem List Items Addressed This Visit    Diabetes mellitus ([place])    Off medication.  Follow met b and a1c.  Low carb diet and exercise.        Relevant Orders   Hemoglobin A1c   Lipid panel   Basic metabolic panel   Headache    Persistent after head injury.  Will obtain CT head.  Rest.  Follow.       Right shoulder pain    Persistent and increased pain.  Now with tingling down arm.  Some loss of grip.  Discussed ortho referral.  Informed of [facility].  She prefers to go to the walk in on a more emergent basis.  Will follow.        Stress    Seeing psychiatry.  Stable.  On citalopram.       Vitamin D deficiency    Continue vitamin D supplements.  Follow vitamin D level.         Other Visit  Diagnoses    Injury of head, initial encounter    -  Primary   Relevant Orders   CT Head Wo Contrast (Completed)   History of gastric surgery       Relevant Orders   CBC with Differential/Platelet   Hepatic function panel   Ferritin       Einar Pheasant, MD

## 2016-11-10 NOTE — Progress Notes (Signed)
Pre visit review using our clinic review tool, if applicable. No additional management support is needed unless otherwise documented below in the visit note. 

## 2016-11-11 ENCOUNTER — Encounter: Payer: Self-pay | Admitting: Internal Medicine

## 2016-11-11 DIAGNOSIS — R519 Headache, unspecified: Secondary | ICD-10-CM | POA: Insufficient documentation

## 2016-11-11 DIAGNOSIS — M25511 Pain in right shoulder: Secondary | ICD-10-CM | POA: Insufficient documentation

## 2016-11-11 DIAGNOSIS — R51 Headache: Secondary | ICD-10-CM

## 2016-11-11 NOTE — Assessment & Plan Note (Signed)
Seeing psychiatry.  Stable.  On citalopram.

## 2016-11-11 NOTE — Assessment & Plan Note (Signed)
Persistent and increased pain.  Now with tingling down arm.  Some loss of grip.  Discussed ortho referral.  Informed of [facility].  She prefers to go to the walk in on a more emergent basis.  Will follow.

## 2016-11-11 NOTE — Addendum Note (Signed)
Addended by: Charm BargesSCOTT, Jaceyon Strole S on: 11/11/2016 05:21 AM   Modules accepted: Orders

## 2016-11-11 NOTE — Assessment & Plan Note (Signed)
Continue vitamin D supplements.  Follow vitamin D level.   

## 2016-11-11 NOTE — Assessment & Plan Note (Signed)
Persistent after head injury.  Will obtain CT head.  Rest.  Follow.

## 2016-11-11 NOTE — Assessment & Plan Note (Signed)
Off medication.  Follow met b and a1c.  Low carb diet and exercise.

## 2016-11-12 ENCOUNTER — Ambulatory Visit (INDEPENDENT_AMBULATORY_CARE_PROVIDER_SITE_OTHER): Payer: 59

## 2016-11-12 ENCOUNTER — Ambulatory Visit (INDEPENDENT_AMBULATORY_CARE_PROVIDER_SITE_OTHER): Payer: 59 | Admitting: Psychiatry

## 2016-11-12 ENCOUNTER — Encounter: Payer: Self-pay | Admitting: Psychiatry

## 2016-11-12 VITALS — BP 145/97 | HR 73 | Temp 98.7°F | Wt 221.8 lb

## 2016-11-12 DIAGNOSIS — Z23 Encounter for immunization: Secondary | ICD-10-CM

## 2016-11-12 DIAGNOSIS — F331 Major depressive disorder, recurrent, moderate: Secondary | ICD-10-CM

## 2016-11-12 DIAGNOSIS — F411 Generalized anxiety disorder: Secondary | ICD-10-CM | POA: Diagnosis not present

## 2016-11-12 MED ORDER — CALCIUM CITRATE-VITAMIN D 250-100 MG-UNIT PO TABS: 1.0000 | ORAL_TABLET | Freq: Two times a day (BID) | ORAL | 1 refills | Status: DC

## 2016-11-12 NOTE — Progress Notes (Signed)
Psychiatric MD Progress Note  Patient Identification: Shirley [name] MRN:  [id number] Date of Evaluation:  11/12/2016 Referral Source: Corrie DandyMary- Therapist Chief Complaint:   Chief Complaint    Follow-up; Medication Refill     Visit Diagnosis:    ICD-9-CM ICD-10-CM   1. Major depressive disorder, recurrent episode, moderate (HCC) 296.32 F33.1   2. Generalized anxiety disorder 300.02 F41.1     History of Present Illness:   Patient is a 35 year old female who presented for follow up.  She reported that she has started improving on her medications. She is sleeping well with the help of trazodone. She reported that she does not have interrupted sleep at night. She felt that the Celexa has been helping her and she has started taking it in the evening. Patient reported that she has social anxiety and we discussed about increasing the dose of Celexa and she agreed with the plan. Patient reported that she has been doing therapy with Corrie DandyMary on a regular basis and it has been helpful. Patient currently denied any side effects of medications. takes it in the morning. She is not having any side effects of medication. She still works long hours during the daytime and she is trying to cut down on the same.  Patient appeared calm and alert during the interview. She currently denied having any suicidal homicidal ideations or plans. Associated Signs/Symptoms: Depression Symptoms:  anxiety, loss of energy/fatigue, disturbed sleep, decreased appetite, (Hypo) Manic Symptoms:  Labiality of Mood, Anxiety Symptoms:  Excessive Worry, Psychotic Symptoms:  none PTSD Symptoms: Negative NA  Past Psychiatric History:  H/o depression. She has tried several medications in the past. She reported that the medications are not effective at this time. No h/o suicide attempts.   Previous Psychotropic Medications:  Ambien  Melatonin Celexa Ativan Zoloft   Substance Abuse History in the last 12 months:  Yes.    Social drinker  Consequences of Substance Abuse: Negative NA  Past Medical History:  Past Medical History:  Diagnosis Date  . Anemia   . Asthma   . Complication of anesthesia    itching after surgery 2005, 2006  . Depression   . Diabetes mellitus (HCC)   . Fatty liver   . GERD (gastroesophageal reflux disease)   . Hypertension   . IBS (irritable bowel syndrome)   . Migraine   . PONV (postoperative nausea and vomiting)    nausea    Past Surgical History:  Procedure Laterality Date  . CHOLECYSTECTOMY  2005  . LAPAROSCOPIC GASTRIC RESTRICTIVE DUODENAL PROCEDURE (DUODENAL SWITCH) N/A 08/28/2015   Procedure: LAPAROSCOPIC GASTRIC RESTRICTIVE DUODENAL PROCEDURE (DUODENAL SWITCH);  Surgeon: Everette RankMichael A Tyner, MD;  Location: [facility] ORS;  Service: General;  Laterality: N/A;  . 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 Hypertension Mother   . Diabetes Mother   . Asthma Brother   . Breast cancer Neg Hx   . Colon cancer Neg Hx     Social History:   Social History   Social History  . Marital status: Single    Spouse name: N/A  . Number of children: 0  . Years of education: N/A   Occupational History  .  [employer]   Social History Main Topics  . Smoking status: Never Smoker  . Smokeless tobacco: Never Used  . Alcohol use No     Comment: social  . Drug  use: No  . Sexual activity: Not Currently   Other Topics Concern  . None   Social History Narrative  . 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  [facility]- [facility] Oct- lost 50 lbs since then.   Lives with cousin. Never married, no children.  [place]    Allergies:   Allergies  Allergen Reactions  . Caffeine Other (See Comments)    Stomach issues  . Ibuprofen Other (See Comments)    Upset stomach    Metabolic Disorder Labs: Lab Results   Component Value Date   HGBA1C 6.0 07/31/2016   No results found for: PROLACTIN Lab Results  Component Value Date   CHOL 129 05/01/2016   TRIG 67 05/01/2016   HDL 62 05/01/2016   LDLCALC 54 05/01/2016   LDLCALC 47 03/21/2016     Current Medications: Current Outpatient Prescriptions  Medication Sig Dispense Refill  . albuterol (PROVENTIL HFA;VENTOLIN HFA) 108 (90 Base) MCG/ACT inhaler Inhale 2 puffs into the lungs every 6 (six) hours as needed for wheezing or shortness of breath. Reported on 05/01/2016 1 Inhaler 1  . azelastine (OPTIVAR) 0.05 % ophthalmic solution 1 drop as needed. Reported on 05/01/2016    . beclomethasone (QVAR) 40 MCG/ACT inhaler Inhale 1 puff into the lungs 2 (two) times daily. Reported on 05/01/2016 1 Inhaler 1  . citalopram (CELEXA) 20 MG tablet Take 1 tablet (20 mg total) by mouth daily. 30 tablet 1  . citalopram (CELEXA) 20 MG tablet Take 1 tablet (20 mg total) by mouth daily. 90 tablet 1  . glucose blood (BAYER CONTOUR NEXT TEST) test strip Use 1 strip two times daily. 200 each 11  . levocetirizine (XYZAL) 5 MG tablet Take 1 tablet (5 mg total) by mouth every evening. Reported on 05/01/2016 30 tablet 1  . Olopatadine HCl (PATANASE) 0.6 % SOLN Place into the nose as needed. Reported on 05/01/2016    . traZODone (DESYREL) 100 MG tablet Take 1 tablet (100 mg total) by mouth at bedtime. 90 tablet 1  . traZODone (DESYREL) 100 MG tablet Take 1 tablet (100 mg total) by mouth at bedtime. 30 tablet 1  . calcium-vitamin D 250-100 MG-UNIT tablet Take 1 tablet by mouth 2 (two) times daily. 30 tablet 1   No current facility-administered medications for this visit.     Neurologic: Headache: No Seizure: No Paresthesias:No  Musculoskeletal: Strength & Muscle Tone: within normal limits Gait & Station: normal Patient leans: N/A  Psychiatric Specialty Exam: Review of Systems  Psychiatric/Behavioral: Positive for depression. The patient has insomnia.   All other systems  reviewed and are negative.   Blood pressure (!) 145/97, pulse 73, temperature 98.7 F (37.1 C), temperature source Oral, weight 221 lb 12.8 oz (100.6 kg), last menstrual period 11/03/2016.Body mass index is 41.91 kg/m.  General Appearance: Casual  Eye Contact:  Fair  Speech:  Clear and Coherent  Volume:  Normal  Mood:  Anxious  Affect:  Congruent  Thought Process:  Coherent  Orientation:  Full (Time, Place, and Person)  Thought Content:  Logical  Suicidal Thoughts:  No  Homicidal Thoughts:  No  Memory:  Immediate;   Fair Recent;   Fair Remote;   Fair  Judgement:  Fair  Insight:  Fair  Psychomotor Activity:  Normal  Concentration:  Concentration: Fair and Attention Span: Fair  Recall:  F[employer]Fair  Fund of Knowledge:Fair  Language: Fair  Akathisia:  No  Handed:  Right  AIMS (if indicated):    Assets:  Communication Skills Desire for Improvement Physical Health Social Support  ADL's:  Intact  Cognition: WNL  Sleep:  poor    Treatment Plan Summary: Medication management    Discussed with patient what the medications treatment risk benefits and alternatives. Continue  trazodone 100 mg qhs  She demonstrated understanding. Increase Celexa 30 mg daily  Has supply of the medications.  Discussed with her about side effects of the medication she demonstrated understanding  Follow-up in one month or earlier depending on her symptoms   More than 50% of the time spent in psychoeducation, counseling and coordination of care.    This note was generated in part or whole with voice recognition software. Voice regonition is usually quite accurate but there are transcription errors that can and very often do occur. I apologize for any typographical errors that were not detected and corrected.    Brandy Hale, MD 12/20/20173:05 PM

## 2016-11-20 ENCOUNTER — Ambulatory Visit (INDEPENDENT_AMBULATORY_CARE_PROVIDER_SITE_OTHER): Payer: 59 | Admitting: Licensed Clinical Social Worker

## 2016-11-20 DIAGNOSIS — F331 Major depressive disorder, recurrent, moderate: Secondary | ICD-10-CM | POA: Diagnosis not present

## 2016-11-20 DIAGNOSIS — F411 Generalized anxiety disorder: Secondary | ICD-10-CM | POA: Diagnosis not present

## 2016-11-20 NOTE — Progress Notes (Signed)
   THERAPIST PROGRESS NOTE  Session Time: 4 PM to 4:50 PM  Participation Level: Active  Behavioral Response: CasualAlertEuthymic  Type of Therapy: Individual Therapy  Treatment Goals addressed: patient will manage stress better, establish better balance and a healthier lifestyle  Interventions: Solution Focused, Strength-based, Supportive and Other: Skills to work on self-esteem and developing healthy lifestyle  Summary: Georgia DomRaytarsha M Hattabaugh is a 35 y.o. female who presents with starting her new job in the new year. She she has decided in 2018 she is going to focus more on her and discussed ways she is going to implement change. She is tired of the "same old" and she realizes she has to change up things to get different results.  Shares that she is going  to do what she wants to do when she wants. Realizes that she is the only one who can be the one to change things. She feels the new job will help things, give her some wiggle room and feels she was suffocating. Her plans include walking more and sit ups, feels good about the haircut she just got, and she is going to throw away old clothes and get new ones to fit. She is motivated to remake her life. She is going back to church. She hasn't done for others lately, and has liked the opportunity to do things for self. Sees that her willingness to make changes has been a process that has been developing and sees she is doing a lot better since when she first started. She wants to be in a relationship and realizes she has to be the one to make it happen. She has a lot of anxiety about relationships but does see that jumping in and doing it be helpful. Reviewed session and she says it helped her to see she is going in the right direction.   Suicidal/Homicidal: No  Therapist Response: Reviewed progress and provided positive feedback to reinforce positive steps patient is taking to work on self and self-esteem. Reinforced patient's insight that she has to be  the agent of change in her life. Reviewed plan and action steps patient is taking to help her focus on herself and feeling good about herself. Introduced patient to how growth as one of the building blocks for building self-esteem and identified ways patient is working on this in her life. Identified anxiety around relationship as an issue and encouraged patient to face her fear and find out what we build up in her head is not what turns out to be our experience and also practice will help her in in overcoming anxiety. Provided supportive and strength-based interventions.   Plan: Return again in 4 weeks.2. Patient will read handout "Basics of Growing" to help her and working on self-esteem. 3.Patient review Ted Talk "The Person and need to marry" to gain insight to working on self.  Diagnosis: Axis I: major depressive disorder, recurrent, moderate, generalized anxiety disorder    Axis II: No diagnosis    Bowman,Mary A, LCSW 11/20/2016

## 2016-12-02 ENCOUNTER — Encounter: Payer: Self-pay | Admitting: Internal Medicine

## 2016-12-02 NOTE — Telephone Encounter (Signed)
Scheduled with NP for tomorrow. Patient having sinus type symptoms with head ache and pressure behind eyes Scheduled for 9 am.

## 2016-12-03 ENCOUNTER — Ambulatory Visit (INDEPENDENT_AMBULATORY_CARE_PROVIDER_SITE_OTHER): Payer: 59 | Admitting: Family

## 2016-12-03 ENCOUNTER — Encounter: Payer: Self-pay | Admitting: Family

## 2016-12-03 VITALS — BP 128/80 | HR 79 | Temp 98.5°F | Ht 61.0 in | Wt 214.2 lb

## 2016-12-03 DIAGNOSIS — R0981 Nasal congestion: Secondary | ICD-10-CM

## 2016-12-03 MED ORDER — HYDROCOD POLST-CPM POLST ER 10-8 MG/5ML PO SUER: 5.0000 mL | Freq: Every day | ORAL | 0 refills | Status: DC

## 2016-12-03 MED ORDER — GUAIFENESIN ER 600 MG PO TB12: 1200.0000 mg | ORAL_TABLET | Freq: Two times a day (BID) | ORAL | 3 refills | Status: DC

## 2016-12-03 NOTE — Patient Instructions (Addendum)
Use albuterol every 6 hours for first 24 hours to get good medication into the lungs and loosen congestion; after, you may use as needed and eventually stop all together when cough resolves.  Please take cough medication at night only as needed. As we discussed, I do not recommend dosing throughout the day as coughing is a protective mechanism . It also helps to break up thick mucous.  Do not take cough suppressants with alcohol as can lead to trouble breathing. Advise caution if taking cough suppressant and operating machinery ( i.e driving a car) as you may feel very tired.   Increase intake of clear fluids. Congestion is best treated by hydration, when mucus is wetter, it is thinner, less sticky, and easier to expel from the body, either through coughing up drainage, or by blowing your nose.   Get plenty of rest.   Use saline nasal drops and blow your nose frequently. Run a humidifier at night and elevate the head of the bed. Vicks Vapor rub will help with congestion and cough. Steam showers and sinus massage for congestion.   Use Acetaminophen or Ibuprofen as needed for fever or pain. Avoid second hand smoke. Even the smallest exposure will worsen symptoms.   Over the counter medications you can try include Delsym for cough, a decongestant for congestion, and Mucinex or Robitussin as an expectorant. Be sure to just get the plain Mucinex or Robitussin that just has one medication (Guaifenesen). We don't recommend the combination products. Note, be sure to drink two glasses of water with each dose of Mucinex as the medication will not work well without adequate hydration.   You can also try a teaspoon of honey to see if this will help reduce cough. Throat lozenges can sometimes be beneficial as well.    This illness will typically last 7 - 10 days.   Please follow up with our clinic if you develop a fever greater than 101 F, symptoms worsen, or do not resolve in the next week.    

## 2016-12-03 NOTE — Progress Notes (Signed)
Pre visit review using our clinic review tool, if applicable. No additional management support is needed unless otherwise documented below in the visit note. 

## 2016-12-03 NOTE — Progress Notes (Signed)
Subjective:    Patient ID: Shirley [name], female    DOB: [birth year], 36 y.o.   MRN: [id number]  CC: Shirley [name] is a 37 y.o. female who presents today for an acute visit.    HPI: CC: sinus congestion x one week, unchanged. Endorses nasal congestion, cough, frontal HA, sinus pressure. Tried tyelonol without relief. No vision changes or 'severe' HA.   No wheezing, SOB, fever.   H/o asthma. Hasn't 'flared up asthma'   H/o gastric bypass and cannot take PO NSAIDs       HISTORY:  Past Medical History:  Diagnosis Date  . Anemia   . Asthma   . Complication of anesthesia    itching after surgery 2005, 2006  . Depression   . Diabetes mellitus (HCC)   . Fatty liver   . GERD (gastroesophageal reflux disease)   . Hypertension   . IBS (irritable bowel syndrome)   . Migraine   . PONV (postoperative nausea and vomiting)    nausea   Past Surgical History:  Procedure Laterality Date  . CHOLECYSTECTOMY  2005  . LAPAROSCOPIC GASTRIC RESTRICTIVE DUODENAL PROCEDURE (DUODENAL SWITCH) N/A 08/28/2015   Procedure: LAPAROSCOPIC GASTRIC RESTRICTIVE DUODENAL PROCEDURE (DUODENAL SWITCH);  Surgeon: [name], MD;  Location: [facility] ORS;  Service: General;  Laterality: N/A;  . 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 Hypertension Mother   . Diabetes Mother   . Asthma Brother   . Breast cancer Neg Hx   . Colon cancer Neg Hx     Allergies: Caffeine and Ibuprofen Current Outpatient Prescriptions on File Prior to Visit  Medication Sig Dispense Refill  . albuterol (PROVENTIL HFA;VENTOLIN HFA) 108 (90 Base) MCG/ACT inhaler Inhale 2 puffs into the lungs every 6 (six) hours as needed for wheezing or shortness of breath. Reported on 05/01/2016 1 Inhaler 1  . azelastine (OPTIVAR) 0.05 % ophthalmic solution 1 drop as needed. Reported on 05/01/2016    . beclomethasone (QVAR) 40 MCG/ACT inhaler Inhale 1 puff into the lungs 2 (two) times daily.  Reported on 05/01/2016 1 Inhaler 1  . calcium-vitamin D 250-100 MG-UNIT tablet Take 1 tablet by mouth 2 (two) times daily. 30 tablet 1  . citalopram (CELEXA) 20 MG tablet Take 1 tablet (20 mg total) by mouth daily. 30 tablet 1  . citalopram (CELEXA) 20 MG tablet Take 1 tablet (20 mg total) by mouth daily. 90 tablet 1  . glucose blood (BAYER CONTOUR NEXT TEST) test strip Use 1 strip two times daily. 200 each 11  . levocetirizine (XYZAL) 5 MG tablet Take 1 tablet (5 mg total) by mouth every evening. Reported on 05/01/2016 30 tablet 1  . Olopatadine HCl (PATANASE) 0.6 % SOLN Place into the nose as needed. Reported on 05/01/2016    . traZODone (DESYREL) 100 MG tablet Take 1 tablet (100 mg total) by mouth at bedtime. 90 tablet 1  . traZODone (DESYREL) 100 MG tablet Take 1 tablet (100 mg total) by mouth at bedtime. 30 tablet 1   No current facility-administered medications on file prior to visit.     Social History  Substance Use Topics  . Smoking status: Never Smoker  . Smokeless tobacco: Never Used  . Alcohol use No     Comment: social    Review of Systems  Constitutional: Negative for chills and fever.  HENT: Positive for congestion, postnasal drip and sinus pressure. Negative for ear pain  and sore throat.   Eyes: Negative for visual disturbance.  Respiratory: Positive for cough. Negative for shortness of breath and wheezing.   Cardiovascular: Negative for chest pain and palpitations.  Gastrointestinal: Negative for nausea and vomiting.  Neurological: Positive for headaches.      Objective:    BP 128/80   Pulse 79   Temp 98.5 F (36.9 C) (Oral)   Ht 5\' 1"  (1.549 m)   Wt 214 lb 3.2 oz (97.2 kg)   LMP 11/03/2016   SpO2 99%   BMI 40.47 kg/m    Physical Exam  Constitutional: She appears well-developed and well-nourished.  HENT:  Head: Normocephalic and atraumatic.  Right Ear: Hearing, tympanic membrane, external ear and ear canal normal. No drainage, swelling or tenderness. No  foreign bodies. Tympanic membrane is not erythematous and not bulging. No middle ear effusion. No decreased hearing is noted.  Left Ear: Hearing, tympanic membrane, external ear and ear canal normal. No drainage, swelling or tenderness. No foreign bodies. Tympanic membrane is not erythematous and not bulging.  No middle ear effusion. No decreased hearing is noted.  Nose: Rhinorrhea present. Right sinus exhibits no maxillary sinus tenderness and no frontal sinus tenderness. Left sinus exhibits no maxillary sinus tenderness and no frontal sinus tenderness.  Mouth/Throat: Uvula is midline, oropharynx is clear and moist and mucous membranes are normal. No oropharyngeal exudate, posterior oropharyngeal edema, posterior oropharyngeal erythema or tonsillar abscesses.  Eyes: Conjunctivae are normal.  Cardiovascular: Regular rhythm, normal heart sounds and normal pulses.   Pulmonary/Chest: Effort normal and breath sounds normal. She has no wheezes. She has no rhonchi. She has no rales.  Lymphadenopathy:       Head (right side): No submental, no submandibular, no tonsillar, no preauricular, no posterior auricular and no occipital adenopathy present.       Head (left side): No submental, no submandibular, no tonsillar, no preauricular, no posterior auricular and no occipital adenopathy present.    She has no cervical adenopathy.  Neurological: She is alert.  Skin: Skin is warm and dry.  Psychiatric: She has a normal mood and affect. Her speech is normal and behavior is normal. Thought content normal.  Vitals reviewed.      Assessment & Plan:  1. Sinus congestion Afebrile. SAo2 99. Patient and I agreed to treat conservatively due to duration of symptoms and delay antibiotics at this time. She will trial mucinex to see if breaks of sinus congestion and relieves HA, pressure. Advised no ibuprofen and to call bariatric surgeon to see if able to take PO NSAIDs for short durations. Advised to hold for now.   If  any symptom worsens, she understands to let me know. PRN cough syrup at bedtime. Work note provided.    - chlorpheniramine-HYDROcodone (TUSSIONEX PENNKINETIC ER) 10-8 MG/5ML SUER; Take 5 mLs by mouth at bedtime.  Dispense: 140 mL; Refill: 0 - guaiFENesin (MUCINEX) 600 MG 12 hr tablet; Take 2 tablets (1,200 mg total) by mouth 2 (two) times daily.  Dispense: 28 tablet; Refill: 3     I am having Shirley [name] maintain her Olopatadine HCl, azelastine, glucose blood, albuterol, levocetirizine, beclomethasone, citalopram, citalopram, traZODone, traZODone, and calcium-vitamin D.   No orders of the defined types were placed in this encounter.   Return precautions given.   Risks, benefits, and alternatives of the medications and treatment plan prescribed today were discussed, and patient expressed understanding.   Education regarding symptom management and diagnosis given to patient on AVS.  Continue to follow  with Dale DurhamSCOTT, CHARLENE, MD for routine health maintenance.   Shirley [name] and I agreed with plan.   Rennie PlowmanMargaret Arnett, FNP

## 2016-12-04 ENCOUNTER — Ambulatory Visit: Payer: Self-pay | Admitting: Family

## 2016-12-12 ENCOUNTER — Ambulatory Visit: Payer: 59 | Admitting: Psychiatry

## 2016-12-18 ENCOUNTER — Ambulatory Visit: Payer: 59 | Admitting: Licensed Clinical Social Worker

## 2017-01-22 ENCOUNTER — Ambulatory Visit (INDEPENDENT_AMBULATORY_CARE_PROVIDER_SITE_OTHER): Payer: 59 | Admitting: Internal Medicine

## 2017-01-22 ENCOUNTER — Encounter: Payer: Self-pay | Admitting: Internal Medicine

## 2017-01-22 VITALS — BP 120/76 | HR 96 | Temp 98.8°F | Resp 16 | Ht 61.0 in | Wt 226.2 lb

## 2017-01-22 DIAGNOSIS — E559 Vitamin D deficiency, unspecified: Secondary | ICD-10-CM | POA: Diagnosis not present

## 2017-01-22 DIAGNOSIS — F331 Major depressive disorder, recurrent, moderate: Secondary | ICD-10-CM | POA: Diagnosis not present

## 2017-01-22 DIAGNOSIS — F439 Reaction to severe stress, unspecified: Secondary | ICD-10-CM | POA: Diagnosis not present

## 2017-01-22 DIAGNOSIS — Z9884 Bariatric surgery status: Secondary | ICD-10-CM

## 2017-01-22 DIAGNOSIS — R0602 Shortness of breath: Secondary | ICD-10-CM | POA: Diagnosis not present

## 2017-01-22 DIAGNOSIS — R05 Cough: Secondary | ICD-10-CM

## 2017-01-22 DIAGNOSIS — R059 Cough, unspecified: Secondary | ICD-10-CM

## 2017-01-22 DIAGNOSIS — E119 Type 2 diabetes mellitus without complications: Secondary | ICD-10-CM

## 2017-01-22 MED ORDER — PREDNISONE 10 MG PO TABS: ORAL_TABLET | ORAL | 0 refills | Status: DC

## 2017-01-22 MED ORDER — ALBUTEROL SULFATE (2.5 MG/3ML) 0.083% IN NEBU
2.5000 mg | INHALATION_SOLUTION | Freq: Once | RESPIRATORY_TRACT | Status: AC
  Administered 2017-01-22: 2.5 mg via RESPIRATORY_TRACT

## 2017-01-22 MED ORDER — FLUTICASONE PROPIONATE HFA 110 MCG/ACT IN AERO: 2.0000 | INHALATION_SPRAY | Freq: Two times a day (BID) | RESPIRATORY_TRACT | 1 refills | Status: DC

## 2017-01-22 NOTE — Progress Notes (Signed)
Pre-visit discussion using our clinic review tool. No additional management support is needed unless otherwise documented below in the visit note.  

## 2017-01-22 NOTE — Progress Notes (Signed)
Patient ID: Shirley [name], female   DOB: 08-28-1981, 36 y.o.   MRN: [id number]   Subjective:    Patient ID: Shirley [name], female    DOB: [birth year], 36 y.o.   MRN: [id number]  HPI  Patient here for a scheduled follow up.  She has been having some nasal congestion.  Some chest tightness and increased cough.  Using inhaler.  Persistent symptoms.  She is eating.  No nausea or vomiting.  Sees Dr Duke Salvia.  S/p gastric surgery.  No abdominal pain.  Bowels moving.  Still with increased stress.  Taking citalopram.  Sees psychiatry.  They increased citalopram to '30mg'$  per day.  Overall she feels she is doing well on her medication.     Past Medical History:  Diagnosis Date  . Anemia   . Asthma   . Complication of anesthesia    itching after surgery 2005, 2006  . Depression   . Diabetes mellitus ([place])   . Fatty liver   . GERD (gastroesophageal reflux disease)   . Hypertension   . IBS (irritable bowel syndrome)   . Migraine   . PONV (postoperative nausea and vomiting)    nausea   Past Surgical History:  Procedure Laterality Date  . CHOLECYSTECTOMY  2005  . LAPAROSCOPIC GASTRIC RESTRICTIVE DUODENAL PROCEDURE (DUODENAL SWITCH) N/A 08/28/2015   Procedure: LAPAROSCOPIC GASTRIC RESTRICTIVE DUODENAL PROCEDURE (DUODENAL SWITCH);  Surgeon: Ladora Daniel, MD;  Location: [facility] ORS;  Service: General;  Laterality: N/A;  . 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 Hypertension Mother   . Diabetes Mother   . Asthma Brother   . Breast cancer Neg Hx   . Colon cancer Neg Hx    Social History   Social History  . Marital status: Single    Spouse name: N/A  . Number of children: 0  . Years of education: N/A   Occupational History  .  [employer]   Social History Main Topics  . Smoking status: Never Smoker  . Smokeless tobacco: Never Used  . Alcohol use No     Comment: social  . Drug use: No  . Sexual activity: Not Currently   Other Topics  Concern  . None   Social History Narrative  . None    Outpatient Encounter Prescriptions as of 01/22/2017  Medication Sig  . albuterol (PROVENTIL HFA;VENTOLIN HFA) 108 (90 Base) MCG/ACT inhaler Inhale 2 puffs into the lungs every 6 (six) hours as needed for wheezing or shortness of breath. Reported on 05/01/2016  . azelastine (OPTIVAR) 0.05 % ophthalmic solution 1 drop as needed. Reported on 05/01/2016  . calcium-vitamin D 250-100 MG-UNIT tablet Take 1 tablet by mouth 2 (two) times daily.  . citalopram (CELEXA) 20 MG tablet Take 1 tablet (20 mg total) by mouth daily.  Marland Kitchen glucose blood (BAYER CONTOUR NEXT TEST) test strip Use 1 strip two times daily.  . hydrOXYzine (ATARAX/VISTARIL) 25 MG tablet Take 25 mg by mouth 3 (three) times daily as needed.  Marland Kitchen ketoconazole (NIZORAL) 2 % cream Apply 1 application topically daily as needed for irritation.  Marland Kitchen levocetirizine (XYZAL) 5 MG tablet Take 1 tablet (5 mg total) by mouth every evening. Reported on 05/01/2016  . montelukast (SINGULAIR) 10 MG tablet Take 10 mg by mouth at bedtime.  . Olopatadine HCl (PATANASE) 0.6 % SOLN Place into the nose as needed. Reported on 05/01/2016  . traZODone (DESYREL) 100 MG tablet Take 1  tablet (100 mg total) by mouth at bedtime.  . fluticasone (FLOVENT HFA) 110 MCG/ACT inhaler Inhale 2 puffs into the lungs 2 (two) times daily.  . predniSONE (DELTASONE) 10 MG tablet Take 4 tablets x 1 day and then decrease by 1/2 tablet per day until down to zero mg.  . [DISCONTINUED] beclomethasone (QVAR) 40 MCG/ACT inhaler Inhale 1 puff into the lungs 2 (two) times daily. Reported on 05/01/2016 (Patient not taking: Reported on 01/22/2017)  . [DISCONTINUED] chlorpheniramine-HYDROcodone (TUSSIONEX PENNKINETIC ER) 10-8 MG/5ML SUER Take 5 mLs by mouth at bedtime. (Patient not taking: Reported on 01/22/2017)  . [DISCONTINUED] citalopram (CELEXA) 20 MG tablet Take 1 tablet (20 mg total) by mouth daily.  . [DISCONTINUED] guaiFENesin (MUCINEX) 600 MG 12 hr  tablet Take 2 tablets (1,200 mg total) by mouth 2 (two) times daily.  . [DISCONTINUED] traZODone (DESYREL) 100 MG tablet Take 1 tablet (100 mg total) by mouth at bedtime.  . [EXPIRED] albuterol (PROVENTIL) (2.5 MG/3ML) 0.083% nebulizer solution 2.5 mg    No facility-administered encounter medications on file as of 01/22/2017.     Review of Systems  Constitutional: Negative for appetite change and unexpected weight change.  HENT: Positive for congestion and postnasal drip.   Respiratory: Positive for cough and chest tightness. Negative for shortness of breath.   Cardiovascular: Negative for palpitations and leg swelling.  Gastrointestinal: Negative for abdominal pain, diarrhea, nausea and vomiting.  Genitourinary: Negative for difficulty urinating and dysuria.  Musculoskeletal: Negative for back pain and joint swelling.  Skin: Negative for color change and rash.  Neurological: Negative for dizziness, light-headedness and headaches.  Psychiatric/Behavioral: Negative for agitation and dysphoric mood.       Objective:    Physical Exam  Constitutional: She appears well-developed and well-nourished. No distress.  HENT:  Mouth/Throat: Oropharynx is clear and moist.  Nares - slightly erythematous turbinates.    Neck: Neck supple. No thyromegaly present.  Cardiovascular: Normal rate and regular rhythm.   Pulmonary/Chest: Breath sounds normal. No respiratory distress. She has no wheezes.  Increased cough with forced expiration.    Abdominal: Soft. Bowel sounds are normal. There is no tenderness.  Musculoskeletal: She exhibits no edema or tenderness.  Lymphadenopathy:    She has no cervical adenopathy.  Skin: No rash noted. No erythema.  Psychiatric: She has a normal mood and affect. Her behavior is normal.    BP 120/76 (BP Location: Left Arm, Patient Position: Sitting, Cuff Size: Large)   Pulse 96   Temp 98.8 F (37.1 C)   Resp 16   Ht '5\' 1"'$  (1.549 m)   Wt 226 lb 3.2 oz (102.6 kg)    SpO2 98%   BMI 42.74 kg/m  Wt Readings from Last 3 Encounters:  01/22/17 226 lb 3.2 oz (102.6 kg)  12/03/16 214 lb 3.2 oz (97.2 kg)  11/12/16 221 lb 12.8 oz (100.6 kg)     Lab Results  Component Value Date   WBC 5.0 05/01/2016   HGB 12.1 09/02/2015   HCT 40.1 05/01/2016   PLT 198 05/01/2016   GLUCOSE 98 05/01/2016   CHOL 129 05/01/2016   TRIG 67 05/01/2016   HDL 62 05/01/2016   LDLCALC 54 05/01/2016   ALT 33 (H) 05/01/2016   AST 25 05/01/2016   NA 141 05/01/2016   K 4.5 05/01/2016   CL 102 05/01/2016   CREATININE 0.63 05/01/2016   BUN 10 05/01/2016   CO2 22 05/01/2016   TSH 1.120 08/10/2015   HGBA1C 6.0 07/31/2016  Ct Head Wo Contrast  Result Date: 11/10/2016 CLINICAL DATA:  Trauma last night. Dizziness and headaches earlier today. EXAM: CT HEAD WITHOUT CONTRAST TECHNIQUE: Contiguous axial images were obtained from the base of the skull through the vertex without intravenous contrast. COMPARISON:  Brain MR of 12/09/2011. FINDINGS: Brain: No mass lesion, hemorrhage, hydrocephalus, acute infarct, intra-axial, or extra-axial fluid collection. Vascular: No hyperdense vessel or unexpected calcification. Skull: No significant soft tissue swelling.  No skull fracture. Sinuses/Orbits: Normal orbits and globes. Clear paranasal sinuses and mastoid air cells. Other: None IMPRESSION: Normal head CT.  No acute or posttraumatic deformity identified. Electronically Signed   By: Abigail Miyamoto M.D.   On: 11/10/2016 17:37       Assessment & Plan:   Problem List Items Addressed This Visit    Bariatric surgery status    S/p surgery.  Seeing Dr Duke Salvia.       Cough    Increased nasal congestion and cough as outlined.  Chest tightness.  Given albuterol neb here in the office.  Felt better.  Breathing better.  Continue nasal spray.  mucinex as directed.  Prednisone taper as directed.  Hold on abx.  Follow.  Notify me or be reevaluated if symptoms change or do not resolve.        Diabetes  mellitus ([place])    Low carb diet and exercise.  Follow met b and a1c.       Major depressive disorder, recurrent episode, moderate ([place])    Seeing psychiatry.  On citalopram.  Stable.       Relevant Medications   hydrOXYzine (ATARAX/VISTARIL) 25 MG tablet   Severe obesity (BMI >= 40) (HCC)    Diet and exercise.  Follow.       SOB (shortness of breath) - Primary   Relevant Medications   albuterol (PROVENTIL) (2.5 MG/3ML) 0.083% nebulizer solution 2.5 mg (Completed)   Stress    Increased stress as outlined.  Seeing psychiatry.  On citalopram.  Feels stable.        Vitamin D deficiency    Continue vitamin D supplements.            Einar Pheasant, MD

## 2017-01-29 ENCOUNTER — Ambulatory Visit: Payer: Self-pay | Admitting: Internal Medicine

## 2017-01-29 ENCOUNTER — Encounter: Payer: Self-pay | Admitting: Internal Medicine

## 2017-01-29 NOTE — Telephone Encounter (Signed)
If she is on prednisone and still having problems breathing, she needs to be reevaluated.  I am unable to see her today.  To acute care if her allergist cannot see her today.  Thanks.

## 2017-01-29 NOTE — Telephone Encounter (Signed)
Patient advised of below she states she has been using albuterol inhaler regular .   She agreed to go to urgent care for further evaluation.

## 2017-01-30 ENCOUNTER — Encounter: Payer: Self-pay | Admitting: Internal Medicine

## 2017-01-31 ENCOUNTER — Encounter: Payer: Self-pay | Admitting: Internal Medicine

## 2017-01-31 DIAGNOSIS — R05 Cough: Secondary | ICD-10-CM | POA: Insufficient documentation

## 2017-01-31 DIAGNOSIS — R059 Cough, unspecified: Secondary | ICD-10-CM | POA: Insufficient documentation

## 2017-01-31 NOTE — Assessment & Plan Note (Signed)
Increased stress as outlined.  Seeing psychiatry.  On citalopram.  Feels stable.

## 2017-01-31 NOTE — Assessment & Plan Note (Signed)
Seeing psychiatry.  On citalopram.  Stable.

## 2017-01-31 NOTE — Assessment & Plan Note (Signed)
S/p surgery.  Seeing Dr Alva Garnetyner.

## 2017-01-31 NOTE — Assessment & Plan Note (Signed)
Continue vitamin D supplements.  

## 2017-01-31 NOTE — Assessment & Plan Note (Signed)
Increased nasal congestion and cough as outlined.  Chest tightness.  Given albuterol neb here in the office.  Felt better.  Breathing better.  Continue nasal spray.  mucinex as directed.  Prednisone taper as directed.  Hold on abx.  Follow.  Notify me or be reevaluated if symptoms change or do not resolve.

## 2017-01-31 NOTE — Assessment & Plan Note (Signed)
Diet and exercise.  Follow.  

## 2017-01-31 NOTE — Assessment & Plan Note (Signed)
Low carb diet and exercise.  Follow met b and a1c.  

## 2017-02-11 ENCOUNTER — Encounter: Payer: Self-pay | Admitting: Psychiatry

## 2017-02-11 ENCOUNTER — Ambulatory Visit (INDEPENDENT_AMBULATORY_CARE_PROVIDER_SITE_OTHER): Payer: 59 | Admitting: Psychiatry

## 2017-02-11 VITALS — BP 128/85 | HR 61 | Temp 98.3°F | Wt 229.6 lb

## 2017-02-11 DIAGNOSIS — F39 Unspecified mood [affective] disorder: Secondary | ICD-10-CM

## 2017-02-11 DIAGNOSIS — F411 Generalized anxiety disorder: Secondary | ICD-10-CM

## 2017-02-11 MED ORDER — TRAZODONE HCL 150 MG PO TABS: 150.0000 mg | ORAL_TABLET | Freq: Every day | ORAL | 1 refills | Status: DC

## 2017-02-11 MED ORDER — CITALOPRAM HYDROBROMIDE 20 MG PO TABS: 20.0000 mg | ORAL_TABLET | Freq: Every day | ORAL | 1 refills | Status: DC

## 2017-02-11 MED ORDER — ARIPIPRAZOLE 2 MG PO TABS: 2.0000 mg | ORAL_TABLET | Freq: Every day | ORAL | 1 refills | Status: DC

## 2017-02-11 NOTE — Progress Notes (Signed)
Psychiatric MD Progress Note  Patient Identification: Shirley [name] MRN:  [id number] Date of Evaluation:  02/11/2017 Referral Source: Corrie Dandy- Therapist Chief Complaint:   Chief Complaint    Follow-up; Medication Refill     Visit Diagnosis:    ICD-9-CM ICD-10-CM   1. Episodic mood disorder (HCC) 296.90 F39   2. Generalized anxiety disorder 300.02 F41.1     History of Present Illness:   Patient is a 36 year old female who presented for follow up.  She reported that she has  experiencing mood symptoms. She reported that she went to her primary care physician and asked if she is perimenopausal. However they did not do any blood test on her. Patient reported that she has been having more swings and wants to start mood stabilizers. She is compliant with her medications. She reported that she is having problems with sleep at night and is interested in going higher on the dose of the trazodone. She denied having suicidal homicidal ideations or plans. She is not using any drugs or alcohol at this time. We discussed about adjusting her medications and she agreed with the plan.   .  Patient appeared calm and alert during the interview. She currently denied having any suicidal homicidal ideations or plans.  Associated Signs/Symptoms: Depression Symptoms:  anxiety, disturbed sleep, decreased appetite, (Hypo) Manic Symptoms:  Labiality of Mood, Anxiety Symptoms:  Excessive Worry, Psychotic Symptoms:  none PTSD Symptoms: Negative NA  Past Psychiatric History:  H/o depression. She has tried several medications in the past. She reported that the medications are not effective at this time. No h/o suicide attempts.   Previous Psychotropic Medications:  Ambien  Melatonin Celexa Ativan Zoloft   Substance Abuse History in the last 12 months:  Yes.   Social drinker  Consequences of Substance Abuse: Negative NA  Past Medical History:  Past Medical History:  Diagnosis Date  . Anemia    . Asthma   . Complication of anesthesia    itching after surgery 2005, 2006  . Depression   . Diabetes mellitus (HCC)   . Fatty liver   . GERD (gastroesophageal reflux disease)   . Hypertension   . IBS (irritable bowel syndrome)   . Migraine   . PONV (postoperative nausea and vomiting)    nausea    Past Surgical History:  Procedure Laterality Date  . CHOLECYSTECTOMY  2005  . LAPAROSCOPIC GASTRIC RESTRICTIVE DUODENAL PROCEDURE (DUODENAL SWITCH) N/A 08/28/2015   Procedure: LAPAROSCOPIC GASTRIC RESTRICTIVE DUODENAL PROCEDURE (DUODENAL SWITCH);  Surgeon: [name], MD;  Location: [facility] ORS;  Service: General;  Laterality: N/A;  . 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 Hypertension Mother   . Diabetes Mother   . Asthma Brother   . Breast cancer Neg Hx   . Colon cancer Neg Hx     Social History:   Social History   Social History  . Marital status: Single    Spouse name: N/A  . Number of children: 0  . Years of education: N/A   Occupational History  .  [employer]   Social History Main Topics  . Smoking status: Never Smoker  . Smokeless tobacco: Never Used  . Alcohol use No     Comment: social  . Drug use: No  . Sexual activity: Not Currently   Other Topics Concern  . None   Social History Narrative  .  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  [facility]- [facility] Oct- lost 50 lbs since then.   Lives with cousin. Never married, no children.  [place]    Allergies:   Allergies  Allergen Reactions  . Caffeine Other (See Comments)    Stomach issues  . Ibuprofen Other (See Comments)    Upset stomach    Metabolic Disorder Labs: Lab Results  Component Value Date   HGBA1C 6.0 07/31/2016   No results found for: PROLACTIN Lab Results  Component Value Date   CHOL 129 05/01/2016   TRIG  67 05/01/2016   HDL 62 05/01/2016   LDLCALC 54 05/01/2016   LDLCALC 47 03/21/2016     Current Medications: Current Outpatient Prescriptions  Medication Sig Dispense Refill  . albuterol (PROVENTIL HFA;VENTOLIN HFA) 108 (90 Base) MCG/ACT inhaler Inhale 2 puffs into the lungs every 6 (six) hours as needed for wheezing or shortness of breath. Reported on 05/01/2016 1 Inhaler 1  . azelastine (OPTIVAR) 0.05 % ophthalmic solution 1 drop as needed. Reported on 05/01/2016    . calcium-vitamin D 250-100 MG-UNIT tablet Take 1 tablet by mouth 2 (two) times daily. 30 tablet 1  . citalopram (CELEXA) 20 MG tablet Take 1 tablet (20 mg total) by mouth daily. 30 tablet 1  . clindamycin (CLEOCIN) 300 MG capsule Take 300 mg by mouth 2 (two) times daily.  0  . fluticasone (FLOVENT HFA) 110 MCG/ACT inhaler Inhale 2 puffs into the lungs 2 (two) times daily. 1 Inhaler 1  . glucose blood (BAYER CONTOUR NEXT TEST) test strip Use 1 strip two times daily. 200 each 11  . hydrOXYzine (ATARAX/VISTARIL) 25 MG tablet Take 25 mg by mouth 3 (three) times daily as needed.    Marland Kitchen. ketoconazole (NIZORAL) 2 % cream Apply 1 application topically daily as needed for irritation.    Marland Kitchen. levocetirizine (XYZAL) 5 MG tablet Take 1 tablet (5 mg total) by mouth every evening. Reported on 05/01/2016 30 tablet 1  . montelukast (SINGULAIR) 10 MG tablet Take 10 mg by mouth at bedtime.    . Olopatadine HCl (PATANASE) 0.6 % SOLN Place into the nose as needed. Reported on 05/01/2016    . predniSONE (DELTASONE) 10 MG tablet Take 4 tablets x 1 day and then decrease by 1/2 tablet per day until down to zero mg. 17 tablet 0  . predniSONE (DELTASONE) 10 MG tablet TAKE 4 TABLETS BY MOUTH ON DAY 1 THEN DECREASE BY 1/2 TABLET PER DAY UNITL DOWN TO ZERO MG    . rifampin (RIFADIN) 300 MG capsule Take 300 mg by mouth.    . traZODone (DESYREL) 100 MG tablet Take 1 tablet (100 mg total) by mouth at bedtime. 30 tablet 1   No current facility-administered medications for  this visit.     Neurologic: Headache: No Seizure: No Paresthesias:No  Musculoskeletal: Strength & Muscle Tone: within normal limits Gait & Station: normal Patient leans: N/A  Psychiatric Specialty Exam: Review of Systems  Psychiatric/Behavioral: Positive for depression. The patient has insomnia.   All other systems reviewed and are negative.   Blood pressure 128/85, pulse 61, temperature 98.3 F (36.8 C), temperature source Oral, weight 229 lb 9.6 oz (104.1 kg), last menstrual period 02/08/2017.Body mass index is 43.38 kg/m.  General Appearance: Casual  Eye Contact:  Fair  Speech:  Clear and Coherent  Volume:  Normal  Mood:  Anxious  Affect:  Congruent  Thought Process:  Coherent  Orientation:  Full (Time, Place, and Person)  Thought Content:  Logical  Suicidal Thoughts:  No  Homicidal Thoughts:  No  Memory:  Immediate;   Fair Recent;   Fair Remote;   Fair  Judgement:  Fair  Insight:  Fair  Psychomotor Activity:  Normal  Concentration:  Concentration: Fair and Attention Span: Fair  Recall:  [employer] of Knowledge:Fair  Language: Fair  Akathisia:  No  Handed:  Right  AIMS (if indicated):    Assets:  Communication Skills Desire for Improvement Physical Health Social Support  ADL's:  Intact  Cognition: WNL  Sleep:  poor    Treatment Plan Summary: Medication management    Discussed with patient what the medications treatment risk benefits and alternatives. Increase trazodone 150 mg by mouth daily at bedtime Start her on Abilify 2 mg by mouth daily Continue Celexa 20 mg daily.   Discussed with her about side effects of the medication she demonstrated understanding  Follow-up in 2  month or earlier depending on her symptoms   More than 50% of the time spent in psychoeducation, counseling and coordination of care.    This note was generated in part or whole with voice recognition software. Voice regonition is usually quite accurate but there are  transcription errors that can and very often do occur. I apologize for any typographical errors that were not detected and corrected.    Brandy Hale, MD 3/21/20189:12 AM

## 2017-02-12 ENCOUNTER — Ambulatory Visit (INDEPENDENT_AMBULATORY_CARE_PROVIDER_SITE_OTHER): Payer: 59 | Admitting: Licensed Clinical Social Worker

## 2017-02-12 ENCOUNTER — Encounter: Payer: Self-pay | Admitting: Internal Medicine

## 2017-02-12 DIAGNOSIS — F332 Major depressive disorder, recurrent severe without psychotic features: Secondary | ICD-10-CM | POA: Diagnosis not present

## 2017-02-12 DIAGNOSIS — F411 Generalized anxiety disorder: Secondary | ICD-10-CM

## 2017-02-12 NOTE — Progress Notes (Signed)
   THERAPIST PROGRESS NOTE  Session Time: 1:15 PM to 2 PM  Participation Level: Active  Behavioral Response: CasualAlertDysphoric, Irritable and Tearful toward end of session  Type of Therapy: Individual Therapy  Treatment Goals addressed: patient will manage stress better, establish better balance and a healthier lifestyle  Interventions: CBT, Solution Focused, Strength-based, Supportive and Reframing  Summary: Shirley [name] is a 36 y.o. female who presents with feelings of hopeless and nothing is going to change. She describes having negative thoughts. She was tearful in session, and also expressed feelings of anger toward uncle and his family. She denies feeling suicidal but feels tired. She is great with the new job and it is a lot less stressful. It may be that she has more time to think. Shared that she jumped down her uncle's throat. Related the additional stress on her because they may have to live with her and depend more on her. She said that with her own problems it is too much. She describes feeling overwhelmed and anxiety high and so wound up that at church can't figure it out. Working on boundaries and described them "being washed away". She feels stuck in general and one main stressor is her financial issue. Discussed setting boundaries with uncle and not enabling including requiring they they go to counseling if they do end up living wit her.. Worked on challenging her perspective of herself and seeing how stress has impacted that. Challenged through therapist's reframing and therapist's own perspective of patient that was more positive. Discussed spirituality as something that could help her right now. Reviewed session and discussed how patient releasing emotions was therapeutic in session.   Suicidal/Homicidal: No  Therapist Response: Dscussed how patient's perspective is impacted by stressors, identified and challenged distortions. Distortions of permenance and pervasiveness.  Utilized reframing to help patient identify achievements and strengths and discussed how positive perspective works to her advantage in helping her with coping. Discussed how patient's stressors includes taking on other people's problems and encouraged patient in setting boundaries. Help patient to process feelings, acknowledge and encourage patient to allow feelings of anger and depression to run their course. Encouraged patient an effective coping strategies such as her spirituality. Provided supportive interventions  Plan: Return again in 4 weeks.2. Discontinue to work with patient on cognitive distortions and stress management strategies that will help improve mood  Diagnosis: Axis I:  major depressive disorder, recurrent, severe, generalized anxiety disorder     Axis II: No diagnosis    Bowman,Mary A, LCSW 02/12/2017

## 2017-02-19 ENCOUNTER — Telehealth: Payer: Self-pay

## 2017-02-19 NOTE — Telephone Encounter (Signed)
Shirley [name] called states that dr. Garnetta Buddyfaheem started her on abilify and she is having dizzyness and headaches.  Shirley [name] wants to know if it is something else she can take.

## 2017-02-27 ENCOUNTER — Encounter: Payer: Self-pay | Admitting: Internal Medicine

## 2017-02-27 NOTE — Telephone Encounter (Signed)
Can you please review and advise. Dr. Garnetta Buddy patient.  Dr. Garnetta Buddy out of the office. Pt was last seen on  02-11-17 and next appt  03-16-17.

## 2017-02-27 NOTE — Telephone Encounter (Signed)
Please tell  her to discontinue the Abilify. Follow-up with Dr.Faheem.

## 2017-03-02 NOTE — Telephone Encounter (Signed)
A message was left for patient to please contact our office to go over issues

## 2017-03-05 ENCOUNTER — Ambulatory Visit (INDEPENDENT_AMBULATORY_CARE_PROVIDER_SITE_OTHER): Payer: 59 | Admitting: Internal Medicine

## 2017-03-05 ENCOUNTER — Encounter: Payer: Self-pay | Admitting: Internal Medicine

## 2017-03-05 VITALS — BP 126/85 | HR 87 | Temp 98.6°F | Resp 14 | Ht 61.0 in | Wt 234.4 lb

## 2017-03-05 DIAGNOSIS — L732 Hidradenitis suppurativa: Secondary | ICD-10-CM | POA: Diagnosis not present

## 2017-03-05 DIAGNOSIS — F331 Major depressive disorder, recurrent, moderate: Secondary | ICD-10-CM | POA: Diagnosis not present

## 2017-03-05 DIAGNOSIS — K219 Gastro-esophageal reflux disease without esophagitis: Secondary | ICD-10-CM

## 2017-03-05 DIAGNOSIS — F439 Reaction to severe stress, unspecified: Secondary | ICD-10-CM | POA: Diagnosis not present

## 2017-03-05 DIAGNOSIS — I10 Essential (primary) hypertension: Secondary | ICD-10-CM | POA: Diagnosis not present

## 2017-03-05 DIAGNOSIS — E119 Type 2 diabetes mellitus without complications: Secondary | ICD-10-CM

## 2017-03-05 NOTE — Progress Notes (Signed)
Patient ID: Shirley [name], female   DOB: Jun 06, 1981, 36 y.o.   MRN: [id number]   Subjective:    Patient ID: Shirley [name], female    DOB: November 14, 1981, 36 y.o.   MRN: [id number]  HPI  Patient here for a scheduled follow up.  She is being followed by dermatology - Dr Ferrel Logan.  Has been on clindamycin and rifampin.  Also on prednisone.  Of prednisone now.  Completed last week.  Was supposed to take the abx for 30 days.  Off now because she felt was contributing to her elevated blood pressure.  Being treated for what sounds like hydradenitis.  Has f/u with dermatology 04/22/17.  States am sugars averaging 97-119.  PM sugars averaging 140-150.  Have been as high as 180 and one reading 218.  We discussed prednisone could elevate.  Off now.  Sugars appear to be doing better.  She is trying to watch her diet.  Not doing as well lately.  Not exercising.  No chest pain.  Congestion and breathing better.  No acid reflux.  No abdominal pain.  Bowels moving.     Past Medical History:  Diagnosis Date  . Anemia   . Asthma   . Complication of anesthesia    itching after surgery 2005, 2006  . Depression   . Diabetes mellitus ([place])   . Fatty liver   . GERD (gastroesophageal reflux disease)   . Hypertension   . IBS (irritable bowel syndrome)   . Migraine   . PONV (postoperative nausea and vomiting)    nausea   Past Surgical History:  Procedure Laterality Date  . CHOLECYSTECTOMY  2005  . LAPAROSCOPIC GASTRIC RESTRICTIVE DUODENAL PROCEDURE (DUODENAL SWITCH) N/A 08/28/2015   Procedure: LAPAROSCOPIC GASTRIC RESTRICTIVE DUODENAL PROCEDURE (DUODENAL SWITCH);  Surgeon: Ladora Daniel, MD;  Location: [facility] ORS;  Service: General;  Laterality: N/A;  . 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 Hypertension Mother   . Diabetes Mother   . Asthma Brother   . Breast cancer Neg Hx   . Colon cancer Neg Hx    Social History   Social History  . Marital status:  Single    Spouse name: N/A  . Number of children: 0  . Years of education: N/A   Occupational History  .  [employer]   Social History Main Topics  . Smoking status: Never Smoker  . Smokeless tobacco: Never Used  . Alcohol use No     Comment: social  . Drug use: No  . Sexual activity: Not Currently   Other Topics Concern  . None   Social History Narrative  . None    Outpatient Encounter Prescriptions as of 03/05/2017  Medication Sig  . albuterol (PROVENTIL HFA;VENTOLIN HFA) 108 (90 Base) MCG/ACT inhaler Inhale 2 puffs into the lungs every 6 (six) hours as needed for wheezing or shortness of breath. Reported on 05/01/2016  . azelastine (OPTIVAR) 0.05 % ophthalmic solution 1 drop as needed. Reported on 05/01/2016  . calcium-vitamin D 250-100 MG-UNIT tablet Take 1 tablet by mouth 2 (two) times daily.  . citalopram (CELEXA) 20 MG tablet Take 1 tablet (20 mg total) by mouth daily.  . fluticasone (FLOVENT HFA) 110 MCG/ACT inhaler Inhale 2 puffs into the lungs 2 (two) times daily.  Marland Kitchen glucose blood (BAYER CONTOUR NEXT TEST) test strip Use 1 strip two times daily.  . hydrOXYzine (ATARAX/VISTARIL) 25 MG tablet Take 25 mg  by mouth 3 (three) times daily as needed.  Marland Kitchen levocetirizine (XYZAL) 5 MG tablet Take 1 tablet (5 mg total) by mouth every evening. Reported on 05/01/2016  . montelukast (SINGULAIR) 10 MG tablet Take 10 mg by mouth at bedtime.  . Olopatadine HCl (PATANASE) 0.6 % SOLN Place into the nose as needed. Reported on 05/01/2016  . [EXPIRED] rifampin (RIFADIN) 300 MG capsule Take 300 mg by mouth.  . traZODone (DESYREL) 150 MG tablet Take 1 tablet (150 mg total) by mouth at bedtime.  . clindamycin (CLEOCIN) 300 MG capsule Take 300 mg by mouth 2 (two) times daily.  Marland Kitchen ketoconazole (NIZORAL) 2 % cream Apply 1 application topically daily as needed for irritation.  . [DISCONTINUED] ARIPiprazole (ABILIFY) 2 MG tablet Take 1 tablet (2 mg total) by mouth daily. (Patient not taking: Reported on  03/05/2017)   No facility-administered encounter medications on file as of 03/05/2017.     Review of Systems  Constitutional: Negative for appetite change.       Gaining weight.    HENT: Negative for congestion and sinus pressure.   Respiratory: Negative for cough, chest tightness and shortness of breath.   Cardiovascular: Negative for chest pain, palpitations and leg swelling.  Gastrointestinal: Negative for abdominal pain, nausea and vomiting.  Genitourinary: Negative for difficulty urinating and dysuria.  Musculoskeletal: Negative for back pain and joint swelling.  Skin: Negative for color change and rash.  Neurological: Negative for dizziness, light-headedness and headaches.  Psychiatric/Behavioral: Negative for agitation and dysphoric mood.       Objective:    Physical Exam  Constitutional: She appears well-developed and well-nourished. No distress.  HENT:  Nose: Nose normal.  Mouth/Throat: Oropharynx is clear and moist.  Neck: Neck supple. No thyromegaly present.  Cardiovascular: Normal rate and regular rhythm.   Pulmonary/Chest: Breath sounds normal. No respiratory distress. She has no wheezes.  Abdominal: Soft. Bowel sounds are normal. There is no tenderness.  Musculoskeletal: She exhibits no edema or tenderness.  Lymphadenopathy:    She has no cervical adenopathy.  Skin: No rash noted. No erythema.  Psychiatric: She has a normal mood and affect. Her behavior is normal.    BP 126/85 (BP Location: Left Arm, Patient Position: Sitting, Cuff Size: Large)   Pulse 87   Temp 98.6 F (37 C) (Oral)   Resp 14   Ht 5' 1" (1.549 m)   Wt 234 lb 6.4 oz (106.3 kg)   LMP 02/08/2017   SpO2 98%   BMI 44.29 kg/m  Wt Readings from Last 3 Encounters:  03/05/17 234 lb 6.4 oz (106.3 kg)  01/22/17 226 lb 3.2 oz (102.6 kg)  12/03/16 214 lb 3.2 oz (97.2 kg)     Lab Results  Component Value Date   WBC 5.0 05/01/2016   HGB 12.1 09/02/2015   HCT 40.1 05/01/2016   PLT 198  05/01/2016   GLUCOSE 98 05/01/2016   CHOL 129 05/01/2016   TRIG 67 05/01/2016   HDL 62 05/01/2016   LDLCALC 54 05/01/2016   ALT 33 (H) 05/01/2016   AST 25 05/01/2016   NA 141 05/01/2016   K 4.5 05/01/2016   CL 102 05/01/2016   CREATININE 0.63 05/01/2016   BUN 10 05/01/2016   CO2 22 05/01/2016   TSH 1.120 08/10/2015   HGBA1C 6.0 07/31/2016    Ct Head Wo Contrast  Result Date: 11/10/2016 CLINICAL DATA:  Trauma last night. Dizziness and headaches earlier today. EXAM: CT HEAD WITHOUT CONTRAST TECHNIQUE: Contiguous axial images were  obtained from the base of the skull through the vertex without intravenous contrast. COMPARISON:  Brain MR of 12/09/2011. FINDINGS: Brain: No mass lesion, hemorrhage, hydrocephalus, acute infarct, intra-axial, or extra-axial fluid collection. Vascular: No hyperdense vessel or unexpected calcification. Skull: No significant soft tissue swelling.  No skull fracture. Sinuses/Orbits: Normal orbits and globes. Clear paranasal sinuses and mastoid air cells. Other: None IMPRESSION: Normal head CT.  No acute or posttraumatic deformity identified. Electronically Signed   By: Abigail Miyamoto M.D.   On: 11/10/2016 17:37       Assessment & Plan:   Problem List Items Addressed This Visit    Diabetes mellitus ([place])    Diet and exercise.  Follow met b and a1c.  Sugars as outlined.  Hold on adding back medication.  Get her back in her exercise routine.  Send in sugar readings over the next few weeks.  Follow.        GERD (gastroesophageal reflux disease)    No upper symptoms reported.  Follow.       Hypertension    Blood pressure under good control.  Continue same medication regimen.  Follow pressures.  Follow metabolic panel.        Major depressive disorder, recurrent episode, moderate ([place])    Followed by psychiatry.  On citalopram.  Stable.       Severe obesity (BMI >= 40) (HCC)    Gaining weight.  Discussed with her today.  Low carb diet and exercise.  Needs to  start her regular exercise again.  Follow.        Stress    Seeing psychiatry.  On citalopram.  Doing better.  Follow.         Other Visit Diagnoses    Hydradenitis    -  Primary   Seeing dermatology.  discussed prednisone increasing sugars.  she stopped her abx.  discussed the need for probiotic.  she will f/u with dermatology on abx.        Einar Pheasant, MD

## 2017-03-05 NOTE — Progress Notes (Signed)
Pre-visit discussion using our clinic review tool. No additional management support is needed unless otherwise documented below in the visit note.  

## 2017-03-08 ENCOUNTER — Encounter: Payer: Self-pay | Admitting: Internal Medicine

## 2017-03-08 NOTE — Assessment & Plan Note (Signed)
Diet and exercise.  Follow met b and a1c.  Sugars as outlined.  Hold on adding back medication.  Get her back in her exercise routine.  Send in sugar readings over the next few weeks.  Follow.

## 2017-03-08 NOTE — Assessment & Plan Note (Signed)
No upper symptoms reported.  Follow.   

## 2017-03-08 NOTE — Assessment & Plan Note (Signed)
Seeing psychiatry.  On citalopram.  Doing better.  Follow.

## 2017-03-08 NOTE — Assessment & Plan Note (Signed)
Followed by psychiatry.  On citalopram.  Stable.

## 2017-03-08 NOTE — Assessment & Plan Note (Signed)
Gaining weight.  Discussed with her today.  Low carb diet and exercise.  Needs to start her regular exercise again.  Follow.

## 2017-03-08 NOTE — Assessment & Plan Note (Signed)
Blood pressure under good control.  Continue same medication regimen.  Follow pressures.  Follow metabolic panel.   

## 2017-03-16 ENCOUNTER — Ambulatory Visit (INDEPENDENT_AMBULATORY_CARE_PROVIDER_SITE_OTHER): Payer: 59 | Admitting: Psychiatry

## 2017-03-16 ENCOUNTER — Encounter: Payer: Self-pay | Admitting: Psychiatry

## 2017-03-16 VITALS — BP 142/86 | HR 81 | Temp 98.5°F | Wt 229.4 lb

## 2017-03-16 DIAGNOSIS — F411 Generalized anxiety disorder: Secondary | ICD-10-CM

## 2017-03-16 DIAGNOSIS — F331 Major depressive disorder, recurrent, moderate: Secondary | ICD-10-CM

## 2017-03-16 MED ORDER — TRAZODONE HCL 150 MG PO TABS: 150.0000 mg | ORAL_TABLET | Freq: Every day | ORAL | 1 refills | Status: DC

## 2017-03-16 MED ORDER — CITALOPRAM HYDROBROMIDE 20 MG PO TABS: 20.0000 mg | ORAL_TABLET | Freq: Every day | ORAL | 1 refills | Status: DC

## 2017-03-16 MED ORDER — LAMOTRIGINE 25 MG PO TABS: 25.0000 mg | ORAL_TABLET | Freq: Every day | ORAL | 1 refills | Status: DC

## 2017-03-16 NOTE — Progress Notes (Signed)
Psychiatric MD Progress Note  Patient Identification: Shirley [name] MRN:  [id number] Date of Evaluation:  03/16/2017 Referral Source: Corrie Dandy- Therapist Chief Complaint:   Chief Complaint    Follow-up; Medication Refill     Visit Diagnosis:    ICD-9-CM ICD-10-CM   1. MDD (major depressive disorder), recurrent episode, moderate (HCC) 296.32 F33.1   2. Generalized anxiety disorder 300.02 F41.1     History of Present Illness:   Patient is a 36 year old female who presented for follow up.  She reported that she has Taking the Abilify as she was started having dizziness after taking 1 pill. She called the office and notified them. She reported that she continues to have more swings and depression. She is interested in trying another medication. We discussed about other medication options. She reported that she has been compliant with medications. She appeared calm and alert during the interview. She denied having any suicidal homicidal ideations or plans. She reported that she has some nightmares related to the present dose but they are not bothering her and she continues to take her medications as prescribed.  Patient appeared calm and alert during the interview. She currently denied having any suicidal homicidal ideations or plans.  Associated Signs/Symptoms: Depression Symptoms:  anxiety, disturbed sleep, decreased appetite, (Hypo) Manic Symptoms:  Labiality of Mood, Anxiety Symptoms:  Excessive Worry, Psychotic Symptoms:  none PTSD Symptoms: Negative NA  Past Psychiatric History:  H/o depression. She has tried several medications in the past. She reported that the medications are not effective at this time. No h/o suicide attempts.   Previous Psychotropic Medications:  Ambien  Melatonin Celexa Ativan Zoloft Abilify- Dizziness   Substance Abuse History in the last 12 months:  Yes.   Social drinker  Consequences of Substance Abuse: Negative NA  Past Medical History:   Past Medical History:  Diagnosis Date  . Anemia   . Asthma   . Complication of anesthesia    itching after surgery 2005, 2006  . Depression   . Diabetes mellitus (HCC)   . Fatty liver   . GERD (gastroesophageal reflux disease)   . Hypertension   . IBS (irritable bowel syndrome)   . Migraine   . PONV (postoperative nausea and vomiting)    nausea    Past Surgical History:  Procedure Laterality Date  . CHOLECYSTECTOMY  2005  . LAPAROSCOPIC GASTRIC RESTRICTIVE DUODENAL PROCEDURE (DUODENAL SWITCH) N/A 08/28/2015   Procedure: LAPAROSCOPIC GASTRIC RESTRICTIVE DUODENAL PROCEDURE (DUODENAL SWITCH);  Surgeon: [name], MD;  Location: [facility] ORS;  Service: General;  Laterality: N/A;  . 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 Hypertension Mother   . Diabetes Mother   . Asthma Brother   . Breast cancer Neg Hx   . Colon cancer Neg Hx     Social History:   Social History   Social History  . Marital status: Single    Spouse name: N/A  . Number of children: 0  . Years of education: N/A   Occupational History  .  [employer]   Social History Main Topics  . Smoking status: Never Smoker  . Smokeless tobacco: Never Used  . Alcohol use No     Comment: social  . Drug use: No  . Sexual activity: Not Currently   Other Topics Concern  . None   Social History Narrative  . None  Additional Social History:  [employer]- works 50 plus hours.  Had duodenal switch at [facility]- last Oct- lost 50 lbs since then.   Lives with cousin. Never married, no children.  Family lives in [place].    Allergies:   Allergies  Allergen Reactions  . Caffeine Other (See Comments)    Stomach issues  . Ibuprofen Other (See Comments)    Upset stomach    Metabolic Disorder Labs: Lab Results  Component Value Date   HGBA1C 6.0 07/31/2016   No results found for: PROLACTIN Lab Results   Component Value Date   CHOL 129 05/01/2016   TRIG 67 05/01/2016   HDL 62 05/01/2016   LDLCALC 54 05/01/2016   LDLCALC 47 03/21/2016     Current Medications: Current Outpatient Prescriptions  Medication Sig Dispense Refill  . albuterol (PROVENTIL HFA;VENTOLIN HFA) 108 (90 Base) MCG/ACT inhaler Inhale 2 puffs into the lungs every 6 (six) hours as needed for wheezing or shortness of breath. Reported on 05/01/2016 1 Inhaler 1  . azelastine (OPTIVAR) 0.05 % ophthalmic solution 1 drop as needed. Reported on 05/01/2016    . calcium-vitamin D 250-100 MG-UNIT tablet Take 1 tablet by mouth 2 (two) times daily. 30 tablet 1  . citalopram (CELEXA) 20 MG tablet Take 1 tablet (20 mg total) by mouth daily. 30 tablet 1  . clindamycin (CLEOCIN) 300 MG capsule Take 300 mg by mouth 2 (two) times daily.  0  . fluticasone (FLOVENT HFA) 110 MCG/ACT inhaler Inhale 2 puffs into the lungs 2 (two) times daily. 1 Inhaler 1  . glucose blood (BAYER CONTOUR NEXT TEST) test strip Use 1 strip two times daily. 200 each 11  . hydrOXYzine (ATARAX/VISTARIL) 25 MG tablet Take 25 mg by mouth 3 (three) times daily as needed.    Marland Kitchen ketoconazole (NIZORAL) 2 % cream Apply 1 application topically daily as needed for irritation.    Marland Kitchen levocetirizine (XYZAL) 5 MG tablet Take 1 tablet (5 mg total) by mouth every evening. Reported on 05/01/2016 30 tablet 1  . montelukast (SINGULAIR) 10 MG tablet Take 10 mg by mouth at bedtime.    . Olopatadine HCl (PATANASE) 0.6 % SOLN Place into the nose as needed. Reported on 05/01/2016    . traZODone (DESYREL) 150 MG tablet Take 1 tablet (150 mg total) by mouth at bedtime. 30 tablet 1   No current facility-administered medications for this visit.     Neurologic: Headache: No Seizure: No Paresthesias:No  Musculoskeletal: Strength & Muscle Tone: within normal limits Gait & Station: normal Patient leans: N/A  Psychiatric Specialty Exam: Review of Systems  Psychiatric/Behavioral: Positive for  depression. The patient has insomnia.   All other systems reviewed and are negative.   Blood pressure (!) 142/86, pulse 81, temperature 98.5 F (36.9 C), temperature source Oral, weight 229 lb 6.4 oz (104.1 kg), last menstrual period 02/08/2017.Body mass index is 43.34 kg/m.  General Appearance: Casual  Eye Contact:  Fair  Speech:  Clear and Coherent  Volume:  Normal  Mood:  Anxious  Affect:  Congruent  Thought Process:  Coherent  Orientation:  Full (Time, Place, and Person)  Thought Content:  Logical  Suicidal Thoughts:  No  Homicidal Thoughts:  No  Memory:  Immediate;   Fair Recent;   Fair Remote;   Fair  Judgement:  Fair  Insight:  Fair  Psychomotor Activity:  Normal  Concentration:  Concentration: Fair and Attention Span: Fair  Recall:  [employer] of Knowledge:Fair  Language: Fair  Akathisia:  No  Handed:  Right  AIMS (if indicated):    Assets:  Communication Skills Desire for Improvement Physical Health Social Support  ADL's:  Intact  Cognition: WNL  Sleep:  poor    Treatment Plan Summary: Medication management    Discussed with patient what the medications treatment risk benefits and alternatives. Increase trazodone 150 mg by mouth daily at bedtime D/c  Abilify  I will start her on lamotrigine 25 mg by mouth daily. Discussed with her about the side effects including the risk of rash and Trudie Buckler syndrome and she demonstrated understanding. Continue Celexa 20 mg daily.   Discussed with her about side effects of the medication she demonstrated understanding  Follow-up in 1 month or earlier depending on her symptoms   More than 50% of the time spent in psychoeducation, counseling and coordination of care.    This note was generated in part or whole with voice recognition software. Voice regonition is usually quite accurate but there are transcription errors that can and very often do occur. I apologize for any typographical errors that were not  detected and corrected.    Brandy Hale, MD 4/23/20189:18 AM

## 2017-03-17 LAB — BASIC METABOLIC PANEL
BUN/Creatinine Ratio: 13 (ref 9–23)
BUN: 11 mg/dL (ref 6–20)
CO2: 25 mmol/L (ref 18–29)
Calcium: 8.8 mg/dL (ref 8.7–10.2)
Chloride: 101 mmol/L (ref 96–106)
Creatinine, Ser: 0.84 mg/dL (ref 0.57–1.00)
GFR calc Af Amer: 103 mL/min/{1.73_m2} (ref >59)
GFR calc non Af Amer: 89 mL/min/{1.73_m2} (ref >59)
Glucose: 106 mg/dL — ABNORMAL HIGH (ref 65–99)
Potassium: 4.3 mmol/L (ref 3.5–5.2)
Sodium: 137 mmol/L (ref 134–144)

## 2017-03-17 LAB — LIPID PANEL
Chol/HDL Ratio: 2.5 ratio (ref 0.0–4.4)
Cholesterol, Total: 148 mg/dL (ref 100–199)
HDL: 59 mg/dL (ref >39)
LDL Calculated: 77 mg/dL (ref 0–99)
Triglycerides: 59 mg/dL (ref 0–149)
VLDL Cholesterol Cal: 12 mg/dL (ref 5–40)

## 2017-03-17 LAB — HEPATIC FUNCTION PANEL
ALT: 17 IU/L (ref 0–32)
AST: 18 IU/L (ref 0–40)
Albumin: 4 g/dL (ref 3.5–5.5)
Alkaline Phosphatase: 97 IU/L (ref 39–117)
Bilirubin Total: 0.6 mg/dL (ref 0.0–1.2)
Bilirubin, Direct: 0.16 mg/dL (ref 0.00–0.40)
Total Protein: 6.6 g/dL (ref 6.0–8.5)

## 2017-03-17 LAB — HEMOGLOBIN A1C
Est. average glucose Bld gHb Est-mCnc: 131 mg/dL
Hgb A1c MFr Bld: 6.2 % — ABNORMAL HIGH (ref 4.8–5.6)

## 2017-03-17 LAB — CBC WITH DIFFERENTIAL/PLATELET
Basophils Absolute: 0 10*3/uL (ref 0.0–0.2)
Basos: 0 %
EOS (ABSOLUTE): 0.1 10*3/uL (ref 0.0–0.4)
Eos: 2 %
Hematocrit: 37.3 % (ref 34.0–46.6)
Hemoglobin: 12 g/dL (ref 11.1–15.9)
Immature Grans (Abs): 0 10*3/uL (ref 0.0–0.1)
Immature Granulocytes: 0 %
Lymphocytes Absolute: 2.6 10*3/uL (ref 0.7–3.1)
Lymphs: 50 %
MCH: 23.6 pg — ABNORMAL LOW (ref 26.6–33.0)
MCHC: 32.2 g/dL (ref 31.5–35.7)
MCV: 73 fL — ABNORMAL LOW (ref 79–97)
Monocytes Absolute: 0.4 10*3/uL (ref 0.1–0.9)
Monocytes: 8 %
Neutrophils Absolute: 2 10*3/uL (ref 1.4–7.0)
Neutrophils: 40 %
Platelets: 203 10*3/uL (ref 150–379)
RBC: 5.09 x10E6/uL (ref 3.77–5.28)
RDW: 14.8 % (ref 12.3–15.4)
WBC: 5.1 10*3/uL (ref 3.4–10.8)

## 2017-03-17 LAB — FERRITIN: Ferritin: 42 ng/mL (ref 15–150)

## 2017-03-19 ENCOUNTER — Ambulatory Visit: Payer: 59 | Admitting: Licensed Clinical Social Worker

## 2017-03-19 ENCOUNTER — Ambulatory Visit: Payer: 59 | Admitting: Behavioral Health

## 2017-03-24 ENCOUNTER — Encounter: Payer: Self-pay | Admitting: Internal Medicine

## 2017-03-24 ENCOUNTER — Other Ambulatory Visit: Payer: Self-pay | Admitting: Psychiatry

## 2017-04-01 ENCOUNTER — Ambulatory Visit (INDEPENDENT_AMBULATORY_CARE_PROVIDER_SITE_OTHER): Payer: 59 | Admitting: Internal Medicine

## 2017-04-01 ENCOUNTER — Encounter: Payer: Self-pay | Admitting: Internal Medicine

## 2017-04-01 VITALS — BP 120/74 | HR 75 | Wt 225.0 lb

## 2017-04-01 DIAGNOSIS — E119 Type 2 diabetes mellitus without complications: Secondary | ICD-10-CM | POA: Diagnosis not present

## 2017-04-01 NOTE — Progress Notes (Signed)
Patient ID: Shirley DomRaytarsha M Cashatt, female   DOB: 1981/10/17, 36 y.o.   MRN: [id number]  HPI: Shirley [name] is a 36 y.o.-year-old female, returning for f/u for DM2, dx 2005, non-insulin-dependent, uncontrolled, with complications ([facility]). Last visit 6 mo ago.  She had GBP sx (sleeve gastrectomy) on 08/28/2015. She developed hypoglycemia and hypotension after the Sx. She had syncope in the hospital. She now feels well and her diabetes has dramatically improved >> off DM meds now.  She lost 50 lbs since the surgery until last visit, then gained 16 lbs >> now lost them.  She was on ABx + Prednisone in 01/2017 (asthma + bronchitis). She had a repeat ABx course afterwards >> 227-240 (after eating).  Last hemoglobin A1c was: Lab Results  Component Value Date   HGBA1C 6.2 (H) 03/16/2017   HGBA1C 6.0 07/31/2016   HGBA1C 5.6 01/29/2016  07/15/2015: 7.1% 07/13/2014: 7.2%  - at work 01/2014: 7.8% 08/2013: 7.2%  She was on: - Januvia 100 mg in am - Metformin XR (osm) 1000 mg tabs 2x a day with meals >> some loose stools - Invokana 100 - added 03/2014 She was on Janumet XR 1000-100 mg po with dinner (used to cut them in half to swallow them better!!!) She was on Metformin in the past >> diarrhea at max dose  Pt checks her sugars 0-3x a day and they are normal: - am:  124-162 >> 88-120, 139, 165x1 >> 90-100 >> 90s >> 85-99 - 2h after b'fast:150s >> 125-141 >> 85 >> 120s >> 110-120s, 145 - before lunch:87, 104-135, 184 >> 98-120 >> 104-149, 161 >> n/c - 2h after lunch:  145-160 >> 121-174 >> 87, 100-121, 146 >> n/c - before dinner:  98-120 >> 131-162 >> 100-136 >> n/c - 2h after dinner: n/c >> 147-180, on ABx and Pred. >> now 120s - bedtime: n/c - nighttime: n/c No lows. Lowest sugar was 90 >> 85;? if  she has hypoglycemia awareness. Highest sugar was 145 >> 247.  Pt's meals are: - Breakfast: protein shake - Lunch: leftovers from dinner; sandwich - Dinner: hamburgers; chicken + veggies + starch  + cookie or cake on weekends  - no CKD, last BUN/creatinine normal:  Lab Results  Component Value Date   BUN 11 03/16/2017   CREATININE 0.84 03/16/2017  She is now off Losartan.  ACR: Lab Results  Component Value Date   MICRALBCREAT 11.2 07/31/2016   MICRALBCREAT 12.1 08/10/2015   - No HL: Lab Results  Component Value Date   CHOL 148 03/16/2017   HDL 59 03/16/2017   LDLCALC 77 03/16/2017   TRIG 59 03/16/2017   CHOLHDL 2.5 03/16/2017  - [facility] No DR.  - no numbness and tingling in her feet. Last foot exam: normal 11/13/2015.  She also has a history of vit D def. (on vitamin 5000 units), HTN, GERD.   ROS: Constitutional: + weight gain/+ weight loss, no fatigue, no subjective hyperthermia, no subjective hypothermia Eyes: no blurry vision, no xerophthalmia ENT: no sore throat, no nodules palpated in throat, no dysphagia, no odynophagia, no hoarseness Cardiovascular: no CP/no SOB/no palpitations/no leg swelling Respiratory: no cough/no SOB/no wheezing Gastrointestinal: no N/no V/no D/no C/no acid reflux Musculoskeletal: no muscle aches/no joint aches Skin: no rashes, no hair loss Neurological: no tremors/no numbness/no tingling/no dizziness  I reviewed pt's medications, allergies, PMH, social hx, family hx, and changes were documented in the history of present illness. Otherwise, unchanged from my  initial visit note.  PE: BP 120/74 (BP Location: Left Arm, Patient Position: Sitting)   Pulse 75   Wt 225 lb (102.1 kg)   LMP 03/16/2017   SpO2 98%   BMI 42.51 kg/m  Body mass index is 42.51 kg/m. Wt Readings from Last 3 Encounters:  04/01/17 225 lb (102.1 kg)  03/05/17 234 lb 6.4 oz (106.3 kg)  01/22/17 226 lb 3.2 oz (102.6 kg)   Constitutional: overweight, in NAD Eyes: PERRLA, EOMI, no exophthalmos ENT: moist mucous membranes, no thyromegaly, no cervical lymphadenopathy Cardiovascular: RRR, No MRG Respiratory: CTA  B Gastrointestinal: abdomen soft, NT, ND, BS+ Musculoskeletal: no deformities, strength intact in all 4 Skin: moist, warm,+  acanthosis nigricans on neck; hirsutism face, chest Neurological: no tremor with outstretched hands, DTR normal in all 4  ASSESSMENT: 1. DM2, non-insulin-dependent, uncontrolled, without complications - [facility]  PLAN:  1. Patient with long-standing, uncontrolled DM, now improved after her GBP sx >> off all meds. Her recent HbA1c was higher, at 6.2% as she had steroids and ABx this Spring >> now sugars at goal. We can continue off all meds, but if HbA1c continues to increase >> may need to add back Metformin. She will let me know if sugars increase before next visit. - I also advised her to: Patient Instructions  Please stay off all diabetes meds for now.   Please let me know if the sugars are consistently >120 before a meal and >150 after a meal.  Please come back for a follow-up appointment in 6 months.  - recent HbA1c was 6.2% - continue checking sugars at different times of the day - check 1x a day, rotating checks - advised for yearly eye exams >> she is UTD - advised for flu shot >> she is UTD - Return to clinic in 6 mo with sugar log   Carlus Pavlov, MD PhD [facility]

## 2017-04-01 NOTE — Patient Instructions (Signed)
Please stay off all diabetes meds for now.   Please let me know if the sugars are consistently >120 before a meal and >150 after a meal.  Please come back for a follow-up appointment in 6 months.     

## 2017-04-15 ENCOUNTER — Ambulatory Visit: Payer: 59 | Admitting: Psychiatry

## 2017-04-16 ENCOUNTER — Ambulatory Visit: Payer: Self-pay | Admitting: Internal Medicine

## 2017-04-27 ENCOUNTER — Ambulatory Visit: Payer: 59 | Admitting: Psychiatry

## 2017-05-26 ENCOUNTER — Ambulatory Visit (INDEPENDENT_AMBULATORY_CARE_PROVIDER_SITE_OTHER): Payer: 59 | Admitting: Internal Medicine

## 2017-05-26 DIAGNOSIS — I10 Essential (primary) hypertension: Secondary | ICD-10-CM | POA: Diagnosis not present

## 2017-05-26 DIAGNOSIS — K047 Periapical abscess without sinus: Secondary | ICD-10-CM | POA: Diagnosis not present

## 2017-05-26 DIAGNOSIS — E119 Type 2 diabetes mellitus without complications: Secondary | ICD-10-CM | POA: Diagnosis not present

## 2017-05-26 DIAGNOSIS — F439 Reaction to severe stress, unspecified: Secondary | ICD-10-CM

## 2017-05-26 DIAGNOSIS — F331 Major depressive disorder, recurrent, moderate: Secondary | ICD-10-CM

## 2017-05-26 MED ORDER — AMOXICILLIN-POT CLAVULANATE 875-125 MG PO TABS: 1.0000 | ORAL_TABLET | Freq: Two times a day (BID) | ORAL | 0 refills | Status: DC

## 2017-05-26 NOTE — Patient Instructions (Signed)
Take a probiotic while you are on the antibiotics and for two weeks after completing the antibiotics.    Examples of probiotics:  Culturelle, florastor.  Align.

## 2017-05-26 NOTE — Progress Notes (Signed)
Pre-visit discussion using our clinic review tool. No additional management support is needed unless otherwise documented below in the visit note.  

## 2017-05-26 NOTE — Progress Notes (Signed)
Patient ID: KAIDYNCE PFISTER, female   DOB: [birth year], 36 y.o.   MRN: [id number]   Subjective:    Patient ID: Jamesetta Orleans, female    DOB: 05/16/1981, 36 y.o.   MRN: [id number]  HPI  Patient here for a scheduled follow up.  She reports she is doing relatively well.  Discussed her current diet and exercise.  She has stopped exercising as frequently.  Plans to restart.  No chest pain.  No sob.  No acid reflux.  No abdominal pain.  Bowels moving.  She does report she has seen her dentist recently.  Diagnosed with abscess (tooth).  Planning f/u 06/16/17.  Is concerned infection may be spreading.  No headache.  Does report left ear hurting some.  Some minimal light headedness.  No fever.  Some sore throat.  No significant sinus pressure.  No chest congestion.  No sob.  No acid reflux.  No abdominal pain.  Bowels moving.  Sugars have been doing ok.  Sees endocrinology.     Past Medical History:  Diagnosis Date  . Anemia   . Asthma   . Complication of anesthesia    itching after surgery 2005, 2006  . Depression   . Diabetes mellitus ([place])   . Fatty liver   . GERD (gastroesophageal reflux disease)   . Hypertension   . IBS (irritable bowel syndrome)   . Migraine   . PONV (postoperative nausea and vomiting)    nausea   Past Surgical History:  Procedure Laterality Date  . CHOLECYSTECTOMY  2005  . LAPAROSCOPIC GASTRIC RESTRICTIVE DUODENAL PROCEDURE (DUODENAL SWITCH) N/A 08/28/2015   Procedure: LAPAROSCOPIC GASTRIC RESTRICTIVE DUODENAL PROCEDURE (DUODENAL SWITCH);  Surgeon: Ladora Daniel, MD;  Location: [facility] ORS;  Service: General;  Laterality: N/A;  . 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 Hypertension Mother   . Diabetes Mother   . Asthma Brother   . Breast cancer Neg Hx   . Colon cancer Neg Hx    Social History   Social History  . Marital status: Single    Spouse name: N/A  . Number of children: 0  . Years of education: N/A    Occupational History  .  [employer]   Social History Main Topics  . Smoking status: Never Smoker  . Smokeless tobacco: Never Used  . Alcohol use No     Comment: social  . Drug use: No  . Sexual activity: Not Currently   Other Topics Concern  . None   Social History Narrative  . None    Outpatient Encounter Prescriptions as of 05/26/2017  Medication Sig  . albuterol (PROVENTIL HFA;VENTOLIN HFA) 108 (90 Base) MCG/ACT inhaler Inhale 2 puffs into the lungs every 6 (six) hours as needed for wheezing or shortness of breath. Reported on 05/01/2016  . azelastine (OPTIVAR) 0.05 % ophthalmic solution 1 drop as needed. Reported on 05/01/2016  . calcium-vitamin D 250-100 MG-UNIT tablet Take 1 tablet by mouth 2 (two) times daily.  . citalopram (CELEXA) 20 MG tablet Take 1 tablet (20 mg total) by mouth daily.  . clindamycin (CLEOCIN) 300 MG capsule Take 300 mg by mouth 2 (two) times daily.  . fluticasone (FLOVENT HFA) 110 MCG/ACT inhaler Inhale 2 puffs into the lungs 2 (two) times daily.  Marland Kitchen glucose blood (BAYER CONTOUR NEXT TEST) test strip Use 1 strip two times daily.  . hydrOXYzine (ATARAX/VISTARIL) 25 MG tablet Take 25 mg by  mouth 3 (three) times daily as needed.  Marland Kitchen ketoconazole (NIZORAL) 2 % cream Apply 1 application topically daily as needed for irritation.  Marland Kitchen lamoTRIgine (LAMICTAL) 25 MG tablet Take 1 tablet (25 mg total) by mouth daily.  Marland Kitchen levocetirizine (XYZAL) 5 MG tablet Take 1 tablet (5 mg total) by mouth every evening. Reported on 05/01/2016  . montelukast (SINGULAIR) 10 MG tablet Take 10 mg by mouth at bedtime.  . Olopatadine HCl (PATANASE) 0.6 % SOLN Place into the nose as needed. Reported on 05/01/2016  . traZODone (DESYREL) 150 MG tablet Take 1 tablet (150 mg total) by mouth at bedtime.  Marland Kitchen amoxicillin-clavulanate (AUGMENTIN) 875-125 MG tablet Take 1 tablet by mouth 2 (two) times daily.   No facility-administered encounter medications on file as of 05/26/2017.     Review of Systems   Constitutional: Negative for appetite change and unexpected weight change.  HENT: Positive for ear pain and sore throat. Negative for sinus pain.   Respiratory: Negative for cough, chest tightness and shortness of breath.   Cardiovascular: Negative for chest pain, palpitations and leg swelling.  Gastrointestinal: Negative for abdominal pain, diarrhea, nausea and vomiting.  Genitourinary: Negative for difficulty urinating and dysuria.  Musculoskeletal: Negative for back pain and joint swelling.  Skin: Negative for color change and rash.  Neurological: Negative for headaches.       Minimal dizziness.    Psychiatric/Behavioral: Negative for agitation and dysphoric mood.       Objective:    Physical Exam  Constitutional: She appears well-developed and well-nourished. No distress.  HENT:  Right Ear: External ear normal.  Left Ear: External ear normal.  Nose: Nose normal.  Mouth/Throat: Oropharynx is clear and moist.  Ears - TMs no increased erythema.  No sinus pressure.    Neck: Neck supple. No thyromegaly present.  Cardiovascular: Normal rate and regular rhythm.   Pulmonary/Chest: Breath sounds normal. No respiratory distress. She has no wheezes.  Abdominal: Soft. Bowel sounds are normal. There is no tenderness.  Musculoskeletal: She exhibits no edema or tenderness.  Lymphadenopathy:    She has no cervical adenopathy.  Skin: No rash noted. No erythema.  Psychiatric: She has a normal mood and affect. Her behavior is normal.    BP 120/72 (BP Location: Left Arm, Patient Position: Sitting, Cuff Size: Large)   Pulse 79   Temp 99.2 F (37.3 C) (Oral)   Resp 12   Ht _0  (1.549 m)   Wt 231 lb 12.8 oz (105.1 kg)   LMP 04/24/2017   SpO2 96%   BMI 43.80 kg/m  Wt Readings from Last 3 Encounters:  05/26/17 231 lb 12.8 oz (105.1 kg)  04/01/17 225 lb (102.1 kg)  03/05/17 234 lb 6.4 oz (106.3 kg)     Lab Results  Component Value Date   WBC 5.1 03/16/2017   HGB 12.0 03/16/2017    HCT 37.3 03/16/2017   PLT 203 03/16/2017   GLUCOSE 106 (H) 03/16/2017   CHOL 148 03/16/2017   TRIG 59 03/16/2017   HDL 59 03/16/2017   LDLCALC 77 03/16/2017   ALT 17 03/16/2017   AST 18 03/16/2017   NA 137 03/16/2017   K 4.3 03/16/2017   CL 101 03/16/2017   CREATININE 0.84 03/16/2017   BUN 11 03/16/2017   CO2 25 03/16/2017   TSH 1.120 08/10/2015   HGBA1C 6.2 (H) 03/16/2017    Ct Head Wo Contrast  Result Date: 11/10/2016 CLINICAL DATA:  Trauma last night. Dizziness and headaches earlier today.  EXAM: CT HEAD WITHOUT CONTRAST TECHNIQUE: Contiguous axial images were obtained from the base of the skull through the vertex without intravenous contrast. COMPARISON:  Brain MR of 12/09/2011. FINDINGS: Brain: No mass lesion, hemorrhage, hydrocephalus, acute infarct, intra-axial, or extra-axial fluid collection. Vascular: No hyperdense vessel or unexpected calcification. Skull: No significant soft tissue swelling.  No skull fracture. Sinuses/Orbits: Normal orbits and globes. Clear paranasal sinuses and mastoid air cells. Other: None IMPRESSION: Normal head CT.  No acute or posttraumatic deformity identified. Electronically Signed   By: Abigail Miyamoto M.D.   On: 11/10/2016 17:37       Assessment & Plan:   Problem List Items Addressed This Visit    Diabetes mellitus ([place])    Sugars doing better.  Seeing endocrinology.  Continue low carb diet and exercise.  Follow met b and a1c.        Hypertension    On no medication now.  Blood pressure doing well.  Follow.        Major depressive disorder, recurrent episode, moderate ([place])    Followed by psychiatry.  Stable.        Severe obesity (BMI >= 40) (HCC)    S/p gastric surgery.  Discussed diet and exercise.  Plans to restart exercise.        Stress    Seeing psychiatry.  Stable.        Tooth abscess    Pt with tooth abscess. Seeing her dentist. Planning for f/u with the dentist 06/16/17.  On no abx.  No evidence of ear infection.   Increased tenderness to palpation right neck, etc.  Will treat with augmentin.  Take probiotic daily while on abx and for two weeks after complete abx.  Any change or worsening symptoms, will need to be evaluated.            Einar Pheasant, MD

## 2017-05-29 ENCOUNTER — Encounter: Payer: Self-pay | Admitting: Internal Medicine

## 2017-05-29 DIAGNOSIS — K047 Periapical abscess without sinus: Secondary | ICD-10-CM | POA: Insufficient documentation

## 2017-05-29 NOTE — Assessment & Plan Note (Signed)
Pt with tooth abscess. Seeing her dentist. Planning for f/u with the dentist 06/16/17.  On no abx.  No evidence of ear infection.  Increased tenderness to palpation right neck, etc.  Will treat with augmentin.  Take probiotic daily while on abx and for two weeks after complete abx.  Any change or worsening symptoms, will need to be evaluated.

## 2017-05-29 NOTE — Assessment & Plan Note (Signed)
S/p gastric surgery.  Discussed diet and exercise.  Plans to restart exercise.

## 2017-05-29 NOTE — Assessment & Plan Note (Signed)
Seeing psychiatry.  Stable.  

## 2017-05-29 NOTE — Assessment & Plan Note (Signed)
On no medication now.  Blood pressure doing well.  Follow.

## 2017-05-29 NOTE — Assessment & Plan Note (Signed)
Followed by psychiatry.  Stable.  

## 2017-05-29 NOTE — Assessment & Plan Note (Signed)
Sugars doing better.  Seeing endocrinology.  Continue low carb diet and exercise.  Follow met b and a1c.   

## 2017-06-29 ENCOUNTER — Other Ambulatory Visit (HOSPITAL_COMMUNITY): Payer: Self-pay | Admitting: Psychiatry

## 2017-06-29 ENCOUNTER — Telehealth: Payer: Self-pay

## 2017-06-29 MED ORDER — CITALOPRAM HYDROBROMIDE 20 MG PO TABS: 20.0000 mg | ORAL_TABLET | Freq: Every day | ORAL | 1 refills | Status: DC

## 2017-06-29 MED ORDER — TRAZODONE HCL 150 MG PO TABS: 150.0000 mg | ORAL_TABLET | Freq: Every day | ORAL | 1 refills | Status: DC

## 2017-06-29 MED ORDER — LAMOTRIGINE 25 MG PO TABS: 25.0000 mg | ORAL_TABLET | Freq: Every day | ORAL | 1 refills | Status: DC

## 2017-06-29 NOTE — Telephone Encounter (Signed)
pt called states she needs enough medication to get to next appt. pt was last seen on  03-16-17 next appt  08-03-17    Disp Refills Start End   citalopram (CELEXA) 20 MG tablet 30 tablet 1 03/16/2017    Sig - Route: Take 1 tablet (20 mg total) by mouth daily. - Oral   Sent to pharmacy as: citalopram (CELEXA) 20 MG tablet   E-Prescribing Status: Receipt confirmed by pharmacy (03/16/2017 9:23 AM EDT)     Disp Refills Start End   lamoTRIgine (LAMICTAL) 25 MG tablet 30 tablet 1 03/16/2017    Sig - Route: Take 1 tablet (25 mg total) by mouth daily. - Oral   Sent to pharmacy as: lamoTRIgine (LAMICTAL) 25 MG tablet   E-Prescribing Status: Receipt confirmed by pharmacy (03/16/2017 9:24 AM EDT)     Disp Refills Start End   traZODone (DESYREL) 150 MG tablet 30 tablet 1 03/16/2017    Sig - Route: Take 1 tablet (150 mg total) by mouth at bedtime. - Oral   Sent to pharmacy as: traZODone (DESYREL) 150 MG tablet   E-Prescribing Status: Receipt confirmed by pharmacy (03/16/2017 9:23 AM EDT)

## 2017-06-29 NOTE — Telephone Encounter (Signed)
done

## 2017-07-17 ENCOUNTER — Encounter: Payer: Self-pay | Admitting: Internal Medicine

## 2017-07-21 ENCOUNTER — Telehealth: Payer: Self-pay | Admitting: Internal Medicine

## 2017-07-21 NOTE — Telephone Encounter (Signed)
Form completed and placed in box.  

## 2017-07-21 NOTE — Telephone Encounter (Signed)
In red folder for your review.

## 2017-07-21 NOTE — Telephone Encounter (Signed)
Pt dropped off a health screening form to be completed. Form is up front in Dr. Roby Lofts color folder.

## 2017-07-22 NOTE — Telephone Encounter (Signed)
Called patient l/m to call office copy sent to scan and original put at reception.

## 2017-07-23 NOTE — Telephone Encounter (Signed)
Left voicemail forms are ready for pick up. ?

## 2017-08-03 ENCOUNTER — Encounter: Payer: Self-pay | Admitting: Psychiatry

## 2017-08-03 ENCOUNTER — Ambulatory Visit (INDEPENDENT_AMBULATORY_CARE_PROVIDER_SITE_OTHER): Payer: 59 | Admitting: Psychiatry

## 2017-08-03 VITALS — BP 138/86 | Temp 99.0°F | Wt 232.4 lb

## 2017-08-03 DIAGNOSIS — F331 Major depressive disorder, recurrent, moderate: Secondary | ICD-10-CM | POA: Diagnosis not present

## 2017-08-03 DIAGNOSIS — F411 Generalized anxiety disorder: Secondary | ICD-10-CM

## 2017-08-03 MED ORDER — LAMOTRIGINE 25 MG PO TABS: 25.0000 mg | ORAL_TABLET | Freq: Every day | ORAL | 1 refills | Status: DC

## 2017-08-03 MED ORDER — TRAZODONE HCL 150 MG PO TABS: 150.0000 mg | ORAL_TABLET | Freq: Every day | ORAL | 1 refills | Status: DC

## 2017-08-03 MED ORDER — HYDROXYZINE HCL 25 MG PO TABS: 25.0000 mg | ORAL_TABLET | Freq: Three times a day (TID) | ORAL | 1 refills | Status: DC | PRN

## 2017-08-03 MED ORDER — CITALOPRAM HYDROBROMIDE 20 MG PO TABS: 20.0000 mg | ORAL_TABLET | Freq: Two times a day (BID) | ORAL | 1 refills | Status: DC

## 2017-08-03 NOTE — Progress Notes (Signed)
Psychiatric MD Progress Note  Patient Identification: Shirley [name] MRN:  [id number] Date of Evaluation:  08/03/2017 Referral Source: Corrie Dandy- Therapist Chief Complaint:   Chief Complaint    Follow-up; Medication Refill     Visit Diagnosis:    ICD-10-CM   1. MDD (major depressive disorder), recurrent episode, moderate (HCC) F33.1   2. Generalized anxiety disorder F41.1     History of Present Illness:   Patient is a 36 year old female who presented for follow up.  She reported that she Notices that she has been having some mood symptoms related to her menstrual cycle. She reported that she feels depressed and her symptoms will last for 3-4 days. We discussed about adjusting her medications. She has already stopped taking the Abilify and is currently on lamotrigine. She reported that her lamotrigine has been helping her. She denied having any side effects related to the medication. She appeared calm and alert during the interview. She reported that she enjoyed her weekend and does not have any acute symptoms at this time. Patient reported that the nightmares are not bothering her at this time. She is willing to have her medications adjusted at this time. She has also been trying to lose weight at this time.  .  Patient appeared calm and alert during the interview. She currently denied having any suicidal homicidal ideations or plans.  Associated Signs/Symptoms: Depression Symptoms:  anxiety, disturbed sleep, (Hypo) Manic Symptoms:  Labiality of Mood, Anxiety Symptoms:  Excessive Worry, Psychotic Symptoms:  none PTSD Symptoms: Negative NA  Past Psychiatric History:  H/o depression. She has tried several medications in the past. She reported that the medications are not effective at this time. No h/o suicide attempts.   Previous Psychotropic Medications:  Ambien  Melatonin Celexa Ativan Zoloft Abilify- Dizziness   Substance Abuse History in the last 12 months:  Yes.   Social  drinker  Consequences of Substance Abuse: Negative NA  Past Medical History:  Past Medical History:  Diagnosis Date  . Anemia   . Asthma   . Complication of anesthesia    itching after surgery 2005, 2006  . Depression   . Diabetes mellitus (HCC)   . Fatty liver   . GERD (gastroesophageal reflux disease)   . Hypertension   . IBS (irritable bowel syndrome)   . Migraine   . PONV (postoperative nausea and vomiting)    nausea    Past Surgical History:  Procedure Laterality Date  . CHOLECYSTECTOMY  2005  . LAPAROSCOPIC GASTRIC RESTRICTIVE DUODENAL PROCEDURE (DUODENAL SWITCH) N/A 08/28/2015   Procedure: LAPAROSCOPIC GASTRIC RESTRICTIVE DUODENAL PROCEDURE (DUODENAL SWITCH);  Surgeon: [name], MD;  Location: [facility] ORS;  Service: General;  Laterality: N/A;  . 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 Hypertension Mother   . Diabetes Mother   . Asthma Brother   . Breast cancer Neg Hx   . Colon cancer Neg Hx     Social History:   Social History   Social History  . Marital status: Single    Spouse name: N/A  . Number of children: 0  . Years of education: N/A   Occupational History  .  [employer]   Social History Main Topics  . Smoking status: Never Smoker  . Smokeless tobacco: Never Used  . Alcohol use No     Comment: social  . Drug use: No  .  Sexual activity: Not Currently   Other Topics Concern  . None   Social History Narrative  . None    Additional Social History:  [employer]- works 50 plus hours.  Had duodenal switch at [facility]- last Oct- lost 50 lbs since then.   Lives with cousin. Never married, no children.  Family lives in [place].    Allergies:   Allergies  Allergen Reactions  . Caffeine Other (See Comments)    Stomach issues  . Ibuprofen Other (See Comments)    Upset stomach    Metabolic Disorder Labs: Lab Results  Component  Value Date   HGBA1C 6.2 (H) 03/16/2017   No results found for: PROLACTIN Lab Results  Component Value Date   CHOL 148 03/16/2017   TRIG 59 03/16/2017   HDL 59 03/16/2017   CHOLHDL 2.5 03/16/2017   LDLCALC 77 03/16/2017   LDLCALC 54 05/01/2016     Current Medications: Current Outpatient Prescriptions  Medication Sig Dispense Refill  . albuterol (PROVENTIL HFA;VENTOLIN HFA) 108 (90 Base) MCG/ACT inhaler Inhale 2 puffs into the lungs every 6 (six) hours as needed for wheezing or shortness of breath. Reported on 05/01/2016 1 Inhaler 1  . amoxicillin-clavulanate (AUGMENTIN) 875-125 MG tablet Take 1 tablet by mouth 2 (two) times daily. 20 tablet 0  . azelastine (OPTIVAR) 0.05 % ophthalmic solution 1 drop as needed. Reported on 05/01/2016    . calcium-vitamin D 250-100 MG-UNIT tablet Take 1 tablet by mouth 2 (two) times daily. 30 tablet 1  . citalopram (CELEXA) 20 MG tablet Take 1 tablet (20 mg total) by mouth 2 (two) times daily. 180 tablet 1  . clindamycin (CLEOCIN) 300 MG capsule Take 300 mg by mouth 2 (two) times daily.  0  . fluticasone (FLOVENT HFA) 110 MCG/ACT inhaler Inhale 2 puffs into the lungs 2 (two) times daily. 1 Inhaler 1  . hydrOXYzine (ATARAX/VISTARIL) 25 MG tablet Take 1 tablet (25 mg total) by mouth 3 (three) times daily as needed. 90 tablet 1  . ketoconazole (NIZORAL) 2 % cream Apply 1 application topically daily as needed for irritation.    Marland Kitchen lamoTRIgine (LAMICTAL) 25 MG tablet Take 1 tablet (25 mg total) by mouth daily. 90 tablet 1  . levocetirizine (XYZAL) 5 MG tablet Take 1 tablet (5 mg total) by mouth every evening. Reported on 05/01/2016 30 tablet 1  . montelukast (SINGULAIR) 10 MG tablet Take 10 mg by mouth at bedtime.    . Olopatadine HCl (PATANASE) 0.6 % SOLN Place into the nose as needed. Reported on 05/01/2016    . traZODone (DESYREL) 150 MG tablet Take 1 tablet (150 mg total) by mouth at bedtime. 90 tablet 1   No current facility-administered medications for this  visit.     Neurologic: Headache: No Seizure: No Paresthesias:No  Musculoskeletal: Strength & Muscle Tone: within normal limits Gait & Station: normal Patient leans: N/A  Psychiatric Specialty Exam: Review of Systems  Psychiatric/Behavioral: Positive for depression. The patient has insomnia.   All other systems reviewed and are negative.   Blood pressure 138/86, temperature 99 F (37.2 C), temperature source Oral, weight 232 lb 6.4 oz (105.4 kg), last menstrual period 07/15/2017.Body mass index is 43.91 kg/m.  General Appearance: Casual  Eye Contact:  Fair  Speech:  Clear and Coherent  Volume:  Normal  Mood:  Anxious  Affect:  Congruent  Thought Process:  Coherent  Orientation:  Full (Time, Place, and Person)  Thought Content:  Logical  Suicidal Thoughts:  No  Homicidal Thoughts:  No  Memory:  Immediate;   Fair Recent;   Fair Remote;   Fair  Judgement:  Fair  Insight:  Fair  Psychomotor Activity:  Normal  Concentration:  Concentration: Fair and Attention Span: Fair  Recall:  FiservFair  Fund of Knowledge:Fair  Language: Fair  Akathisia:  No  Handed:  Right  AIMS (if indicated):    Assets:  Communication Skills Desire for Improvement Physical Health Social Support  ADL's:  Intact  Cognition: WNL  Sleep:  poor    Treatment Plan Summary: Medication management    Discussed with patient what the medications treatment risk benefits and alternatives.  trazodone 150 mg by mouth daily at bedtime  lamotrigine 25 mg by mouth daily. Discussed with her about the side effects including the risk of rash and Trudie BucklerSteven Johnson syndrome and she demonstrated understanding. Continue Celexa 20 mg daily.However the patient will increase the dose to 40 mg for one week during her cycle and she agreed with the plan. She will be given 20 mg by mouth twice a day. Patient will follow up in 1 month.   Discussed with her about side effects of the medication she demonstrated  understanding     More than 50% of the time spent in psychoeducation, counseling and coordination of care.    This note was generated in part or whole with voice recognition software. Voice regonition is usually quite accurate but there are transcription errors that can and very often do occur. I apologize for any typographical errors that were not detected and corrected.    Brandy HaleUzma Huckleberry Martinson, MD 9/10/201810:38 AM

## 2017-08-31 ENCOUNTER — Ambulatory Visit: Payer: 59 | Admitting: Psychiatry

## 2017-09-16 ENCOUNTER — Ambulatory Visit (INDEPENDENT_AMBULATORY_CARE_PROVIDER_SITE_OTHER): Payer: 59 | Admitting: Family Medicine

## 2017-09-16 ENCOUNTER — Encounter: Payer: Self-pay | Admitting: Family Medicine

## 2017-09-16 DIAGNOSIS — K219 Gastro-esophageal reflux disease without esophagitis: Secondary | ICD-10-CM | POA: Diagnosis not present

## 2017-09-16 DIAGNOSIS — J45901 Unspecified asthma with (acute) exacerbation: Secondary | ICD-10-CM

## 2017-09-16 MED ORDER — PREDNISONE 20 MG PO TABS: 40.0000 mg | ORAL_TABLET | Freq: Every day | ORAL | 0 refills | Status: DC

## 2017-09-16 MED ORDER — PANTOPRAZOLE SODIUM 40 MG PO TBEC: 40.0000 mg | DELAYED_RELEASE_TABLET | Freq: Every day | ORAL | 0 refills | Status: DC

## 2017-09-16 NOTE — Patient Instructions (Signed)
Nice to see you. Your symptoms are likely related to an asthma exacerbation potentially related to allergies or a viral illness. We will treat you with prednisone. Please use your albuterol inhaler every 6 hours for the next 2 days and then every 6 hours as needed. We'll treat your reflux with Protonix. If your symptoms worsen or you develop chest pain, cough productive of blood, fevers, blood in your stool, abdominal pain, or any new change in symptoms please seek medical attention.

## 2017-09-16 NOTE — Assessment & Plan Note (Signed)
Clinical history consistent with asthma exacerbation. Patient reports this is similar to prior exacerbations. Likely triggered by viral URI or allergies. History inconsistent with cardiac cause. We will cover her with prednisone. She'll use her albuterol office every 6 hours for the next 2 days and then as needed. If symptoms worsen or she develops new symptoms she'll be reevaluated. Given return precautions.

## 2017-09-16 NOTE — Progress Notes (Signed)
  Marikay AlarEric Judythe Postema, MD Phone: [phone]  Georgia DomRaytarsha M Butters is a 36 y.o. female who presents today for same-day visit.  Patient notes a week or so ago she developed some upper respiratory congestion and postnasal drip. Subsequently developed some wheezing with some dyspnea and tightness consistent with prior asthma exacerbations. She's been coughing as well. Cough is nonproductive. Has felt some rattling in her chest. She notes no chest pressure. No diaphoresis. No radiation of the chest tightness. No exertional tightness. Notes over the last day or so she has started to improve some. She notes she has been using her Flovent and albuterol. Denies cardiac issues in the past. Prior echo done for shortness of breath appears to have been unremarkable for cardiac cause of dyspnea.  GERD: Has a history of this in the past. Several weeks ago developed some burning and regurgitation sensation after eating. She was burping a lot. She would take Pepcid and this would resolve her symptoms. She notes no blood in her stool or abdominal pain. Previously on Protonix.  PMH: nonsmoker.   ROS see history of present illness  Objective  Physical Exam Vitals:   09/16/17 1010  BP: 108/62  Pulse: 90  Temp: 98.6 F (37 C)  SpO2: 99%    BP Readings from Last 3 Encounters:  09/16/17 108/62  05/26/17 120/72  04/01/17 120/74   Wt Readings from Last 3 Encounters:  09/16/17 238 lb 12.8 oz (108.3 kg)  05/26/17 231 lb 12.8 oz (105.1 kg)  04/01/17 225 lb (102.1 kg)    Physical Exam  Constitutional: No distress.  HENT:  Head: Normocephalic and atraumatic.  Mouth/Throat: Oropharynx is clear and moist. No oropharyngeal exudate.  Normal TMs  Eyes: Pupils are equal, round, and reactive to light. Conjunctivae are normal.  Cardiovascular: Normal rate, regular rhythm and normal heart sounds.   Pulmonary/Chest: Effort normal and breath sounds normal.  Neurological: She is alert. Gait normal.  Skin: Skin is warm  and dry. She is not diaphoretic.     Assessment/Plan: Please see individual problem list.  Asthma exacerbation Clinical history consistent with asthma exacerbation. Patient reports this is similar to prior exacerbations. Likely triggered by viral URI or allergies. History inconsistent with cardiac cause. We will cover her with prednisone. She'll use her albuterol office every 6 hours for the next 2 days and then as needed. If symptoms worsen or she develops new symptoms she'll be reevaluated. Given return precautions.  GERD (gastroesophageal reflux disease) Symptoms consistent with her prior reflux. Responded to Pepcid. Discussed option of continuing Pepcid versus starting back on Protonix for short course. She opted for Protonix. This was sent to her pharmacy. If not improved after a one-month course of this she'll be reevaluated. Given return precautions.   Marikay AlarEric Meagan Ancona, MD [facility] [place]

## 2017-09-16 NOTE — Assessment & Plan Note (Signed)
Symptoms consistent with her prior reflux. Responded to Pepcid. Discussed option of continuing Pepcid versus starting back on Protonix for short course. She opted for Protonix. This was sent to her pharmacy. If not improved after a one-month course of this she'll be reevaluated. Given return precautions.

## 2017-10-02 ENCOUNTER — Ambulatory Visit: Payer: Self-pay | Admitting: Internal Medicine

## 2017-10-12 ENCOUNTER — Ambulatory Visit: Payer: 59 | Admitting: Psychiatry

## 2017-11-19 ENCOUNTER — Ambulatory Visit: Payer: Self-pay

## 2017-11-19 NOTE — Telephone Encounter (Signed)
   Reason for Disposition . Ear congestion  Answer Assessment - Initial Assessment Questions 1. LOCATION: "Which ear is involved?"       Both ears 2. SENSATION: "Describe how the ear feels."      Fullness and sharp pain  3. ONSET:  "When did the ear symptoms start?"       Sunday 4. PAIN: "Do you also have an earache?" If so, ask: "How bad is it?" (Scale 1-10; or mild, moderate, severe)     6 5. CAUSE: "What do you think is causing the ear congestion?"     Ear infection 6. URI: "Do you have a runny nose or cough?"      Yes with yellow drainage 7. NASAL ALLERGIES: "Are there symptoms of hay fever, such as sneezing or a clear nasal discharge?"     Sneezing 8. PREGNANCY: "Is there any chance you are pregnant?" "When was your last menstrual period?"     No  Protocols used: EAR - CONGESTION-A-AH  Pt. Will try e-visit as well.

## 2017-11-25 LAB — HM DIABETES EYE EXAM

## 2017-11-30 ENCOUNTER — Ambulatory Visit: Payer: Self-pay | Admitting: Internal Medicine

## 2017-12-10 ENCOUNTER — Encounter: Payer: Self-pay | Admitting: Internal Medicine

## 2017-12-10 NOTE — Telephone Encounter (Signed)
Dr. Lorin PicketScott, okay to refill pt's Pantoprazole?

## 2017-12-10 NOTE — Telephone Encounter (Signed)
Please schedule her for a f/u appt.  Once scheduled ok to refill x 1.

## 2017-12-11 MED ORDER — PANTOPRAZOLE SODIUM 40 MG PO TBEC: 40.0000 mg | DELAYED_RELEASE_TABLET | Freq: Every day | ORAL | 0 refills | Status: DC

## 2017-12-11 NOTE — Telephone Encounter (Signed)
Please call and schedule patient for follow up. Medication has been refilled to [facility]

## 2017-12-14 NOTE — Telephone Encounter (Signed)
Pt scheduled for 01/13/18 @ 12

## 2017-12-23 ENCOUNTER — Encounter: Payer: Self-pay | Admitting: Internal Medicine

## 2017-12-23 NOTE — Progress Notes (Signed)
Eye exam - abstracted.  No retinopathy.

## 2018-01-07 ENCOUNTER — Encounter: Payer: Self-pay | Admitting: Internal Medicine

## 2018-01-07 ENCOUNTER — Ambulatory Visit: Payer: Managed Care, Other (non HMO) | Admitting: Internal Medicine

## 2018-01-07 VITALS — BP 122/92 | HR 73 | Ht 61.0 in | Wt 239.6 lb

## 2018-01-07 DIAGNOSIS — Z23 Encounter for immunization: Secondary | ICD-10-CM | POA: Diagnosis not present

## 2018-01-07 DIAGNOSIS — E119 Type 2 diabetes mellitus without complications: Secondary | ICD-10-CM | POA: Diagnosis not present

## 2018-01-07 LAB — POCT GLYCOSYLATED HEMOGLOBIN (HGB A1C): Hemoglobin A1C: 7.1

## 2018-01-07 NOTE — Progress Notes (Signed)
Patient ID: Shirley [name], female   DOB: 1981/10/18, 37 y.o.   MRN: [id number]  HPI: Shirley [name] is a 37 y.o.-year-old female, returning for f/u for DM2, dx 2005, non-insulin-dependent, uncontrolled, with complications (MAU). Last visit 9 months ago.  She had gastric bypass surgery (sleeve gastrectomy) on 08/28/2015.  She did have complications after the surgery, hypoglycemia and hypotension, both resolved. Her diabetes dramatically improved after the surgery and she is now off all of her medicines.   She has GERD and AP >> she saw her bariatric surgeon >> barium swallow  -abnormal stomach architecture >> will need to have revision of her surgery.   She lost a total of 50 lbs after the surgery.  She has been off and on Prednisone for asthma. Glynis Smiles also had a sinus infection >> on Augmentin, and Amoxicillin.  Last hemoglobin A1c was: Lab Results  Component Value Date   HGBA1C 7.1 01/07/2018   HGBA1C 6.2 (H) 03/16/2017   HGBA1C 6.0 07/31/2016  07/15/2015: 7.1% 07/13/2014: 7.2%  - at work 01/2014: 7.8% 08/2013: 7.2%  Previous regimen: - Januvia 100 mg in am - Metformin XR (osm) 1000 mg tabs 2x a day with meals >> some loose stools - Invokana 100 - added 03/2014 She was on Janumet XR 1000-100 mg po with dinner (used to cut them in half to swallow them better!!!) She was on Metformin in the past >> diarrhea at max dose  Pt doe snot have a meter anymore >> sugars from last visit:  - am:  124-162 >> 88-120, 139, 165x1 >> 90-100 >> 90s >> 85-99 - 2h after b'fast:150s >> 125-141 >> 85 >> 120s >> 110-120s, 145 - before lunch:87, 104-135, 184 >> 98-120 >> 104-149, 161 >> n/c - 2h after lunch:  145-160 >> 121-174 >> 87, 100-121, 146 >> n/c - before dinner:  98-120 >> 131-162 >> 100-136 >> n/c - 2h after dinner: n/c >> 147-180, on ABx and Pred. >> now 120s - bedtime: n/c - nighttime: n/c Lowest sugar was 90 >> 85 >> ?; it is unclear at what level she has hypoglycemia awareness Highest  sugar was 145 >> 247 >> ?.  -No CKD, last BUN/creatinine normal:  Lab Results  Component Value Date   BUN 11 03/16/2017   CREATININE 0.84 03/16/2017  On losartan.  Normal ACR: Lab Results  Component Value Date   MICRALBCREAT 11.2 07/31/2016   MICRALBCREAT 12.1 08/10/2015   -No HL: Lab Results  Component Value Date   CHOL 148 03/16/2017   HDL 59 03/16/2017   LDLCALC 77 03/16/2017   TRIG 59 03/16/2017   CHOLHDL 2.5 03/16/2017  Not on a statin. - last eye exam was in 11/2017: No DR. -No numbness and tingling in her feet.  She also has a history of vitamin D deficiency (on vitamin 5000 units), HTN, GERD.   ROS: Constitutional: + weight gain/no weight loss, no fatigue, no subjective hyperthermia, no subjective hypothermia Eyes: no blurry vision, no xerophthalmia ENT: no sore throat, no nodules palpated in throat, no dysphagia, no odynophagia, no hoarseness Cardiovascular: no CP/no SOB/no palpitations/no leg swelling Respiratory: no cough/no SOB/no wheezing Gastrointestinal: + N/no V/no D/no C/+ acid reflux/+ AP Musculoskeletal: no muscle aches/no joint aches Skin: no rashes, no hair loss Neurological: no tremors/no numbness/no tingling/no dizziness  I reviewed pt's medications, allergies, PMH, social hx, family hx, and changes were documented in the history of present illness. Otherwise, unchanged from my initial visit note.  PE: BP (!) 122/92 (BP  Location: Left Arm, Patient Position: Sitting, Cuff Size: Large)   Pulse 73   Ht 5\' 1"  (1.549 m)   Wt 239 lb 9.6 oz (108.7 kg)   BMI 45.27 kg/m  Body mass index is 45.27 kg/m. Wt Readings from Last 3 Encounters:  01/07/18 239 lb 9.6 oz (108.7 kg)  09/16/17 238 lb 12.8 oz (108.3 kg)  05/26/17 231 lb 12.8 oz (105.1 kg)   Constitutional: overweight, in NAD Eyes: PERRLA, EOMI, no exophthalmos ENT: moist mucous membranes, no thyromegaly, no cervical lymphadenopathy Cardiovascular: RRR, No MRG Respiratory: CTA  B Gastrointestinal: abdomen soft, NT, ND, BS+ Musculoskeletal: no deformities, strength intact in all 4 Skin: moist, warm, +  acanthosis nigricans on neck; hirsutism face, chest Neurological: no tremor with outstretched hands, DTR normal in all 4  ASSESSMENT: 1. DM2, non-insulin-dependent, uncontrolled, without complications - MAU  2. Obesity class III  PLAN:  1. Patient with long-standing, uncontrolled, diabetes, greatly improved after her gastric bypass surgery.  She was able to come off all of her medicines.  Last HbA1c was 6.2% last spring.  However, in the last few months, she could not check her sugars as she did not have a meter.  She also gained weight.  She had prednisone tapers and was also on antibiotics for upper respiratory infection.  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 - For now, I advised her to call her insurance to find out what meter is covered and I will send this to her pharmacy to start checking her sugars - It is not absolutely mandatory to start medications for now, but will keep a close eye on her sugars and will need to do so if they do not improve - I also advised her to: Patient Instructions  Please stay off all diabetes meds for now.   Let me know which meter is covered.  Please let me know if the sugars are consistently >120 before a meal and >150 after a meal.  Please come back for a follow-up appointment in 3 months.  - today, HbA1c is 7.1% (higher) - continue checking sugars at different times of the day - check 1x a day, rotating checks - advised for yearly eye exams >> she is UTD - Return to clinic in 3 mo with sugar log   2.  Obesity class III - History of gastric sleeve surgery in 2016 - Gained approximately 8 pounds since last visit  - also please see above  Carlus Pavlovristina Bleu Minerd, MD PhD [facility]

## 2018-01-07 NOTE — Patient Instructions (Signed)
Please stay off all diabetes meds for now.   Let me know which meter is covered.  Please let me know if the sugars are consistently >120 before a meal and >150 after a meal.  Please come back for a follow-up appointment in 3 months.

## 2018-01-07 NOTE — Addendum Note (Signed)
Addended by: Ellin GoodieLOWE, [place] Soy S on: 01/07/2018 04:01 PM   Modules accepted: Orders

## 2018-01-11 ENCOUNTER — Ambulatory Visit: Payer: 59 | Admitting: Psychiatry

## 2018-01-11 ENCOUNTER — Encounter: Payer: Self-pay | Admitting: Psychiatry

## 2018-01-11 ENCOUNTER — Other Ambulatory Visit: Payer: Self-pay

## 2018-01-11 VITALS — BP 135/89 | HR 81 | Temp 97.7°F | Wt 238.8 lb

## 2018-01-11 DIAGNOSIS — F331 Major depressive disorder, recurrent, moderate: Secondary | ICD-10-CM | POA: Diagnosis not present

## 2018-01-11 DIAGNOSIS — F411 Generalized anxiety disorder: Secondary | ICD-10-CM

## 2018-01-11 MED ORDER — BUPROPION HCL ER (SR) 100 MG PO TB12: 100.0000 mg | ORAL_TABLET | Freq: Every day | ORAL | 1 refills | Status: DC

## 2018-01-11 MED ORDER — LAMOTRIGINE 25 MG PO TABS: 50.0000 mg | ORAL_TABLET | Freq: Every day | ORAL | 1 refills | Status: DC

## 2018-01-11 MED ORDER — TRAZODONE HCL 150 MG PO TABS: 150.0000 mg | ORAL_TABLET | Freq: Every day | ORAL | 1 refills | Status: DC

## 2018-01-11 NOTE — Progress Notes (Signed)
Psychiatric MD Progress Note  Patient Identification: Shirley [name] MRN:  [id number] Date of Evaluation:  01/11/2018 Referral Source: Corrie DandyMary- Therapist Chief Complaint:   Chief Complaint    Follow-up; Medication Refill     Visit Diagnosis:    ICD-10-CM   1. MDD (major depressive disorder), recurrent episode, moderate (HCC) F33.1   2. Generalized anxiety disorder F41.1     History of Present Illness:   Patient is a 37 year old female who presented for follow up.  She reported that she ran out of her medications as she missed her appointments and has not been seen since September. Patient reported that she is only taking lamotrigine at this time. She reported that she is not able to sleep well. Patient reported that she wants to have her medications adjusted. She feels depressed and has no energy when she wakes up in the morning. She reported that the medications were helping her in the past. She currently denied having any suicidal homicidal ideations or plans. She denied having any perceptual disturbances.  We discussed about several medications and she agreed to a trial of Wellbutrin at this time. She will also takes trazodone at bedtime to help her sleep. She currently works full-time and does not have any stressors at job.    Associated Signs/Symptoms: Depression Symptoms:  anxiety, disturbed sleep, (Hypo) Manic Symptoms:  Labiality of Mood, Anxiety Symptoms:  Excessive Worry, Psychotic Symptoms:  none PTSD Symptoms: Negative NA  Past Psychiatric History:  H/o depression. She has tried several medications in the past. She reported that the medications are not effective at this time. No h/o suicide attempts.   Previous Psychotropic Medications:  Ambien  Melatonin Celexa Ativan Zoloft Abilify- Dizziness   Substance Abuse History in the last 12 months:  Yes.   Social drinker  Consequences of Substance Abuse: Negative NA  Past Medical History:  Past Medical  History:  Diagnosis Date  . Anemia   . Asthma   . Complication of anesthesia    itching after surgery 2005, 2006  . Depression   . Diabetes mellitus (HCC)   . Fatty liver   . GERD (gastroesophageal reflux disease)   . Hypertension   . IBS (irritable bowel syndrome)   . Migraine   . PONV (postoperative nausea and vomiting)    nausea    Past Surgical History:  Procedure Laterality Date  . CHOLECYSTECTOMY  2005  . LAPAROSCOPIC GASTRIC RESTRICTIVE DUODENAL PROCEDURE (DUODENAL SWITCH) N/A 08/28/2015   Procedure: LAPAROSCOPIC GASTRIC RESTRICTIVE DUODENAL PROCEDURE (DUODENAL SWITCH);  Surgeon: Everette RankMichael A Tyner, MD;  Location: [facility] ORS;  Service: General;  Laterality: N/A;  . 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 Hypertension Mother   . Diabetes Mother   . Asthma Brother   . Breast cancer Neg Hx   . Colon cancer Neg Hx     Social History:   Social History   Socioeconomic History  . Marital status: Single    Spouse name: None  . Number of children: 0  . Years of education: None  . Highest education level: None  Social Needs  . Financial resource strain: None  . Food insecurity - worry: None  . Food insecurity - inability: None  . Transportation needs - medical: None  . Transportation needs - non-medical: None  Occupational History    Employer: [employer]  Tobacco Use  . Smoking  status: Never Smoker  . Smokeless tobacco: Never Used  Substance and Sexual Activity  . Alcohol use: No    Alcohol/week: 0.0 oz    Comment: social  . Drug use: No  . Sexual activity: Not Currently  Other Topics Concern  . None  Social History Narrative  . None    Additional Social History:  [employer]- works 50 plus hours.  Had duodenal switch at [facility]- last Oct- lost 50 lbs since then.   Lives with cousin. Never married, no children.  Family lives in [place].    Allergies:    Allergies  Allergen Reactions  . Ibuprofen Other (See Comments)    Upset stomach Stomach Issues Upset stomach  Stomach Issues  Stomach Issues   . Caffeine Other (See Comments)    Stomach issues    Metabolic Disorder Labs: Lab Results  Component Value Date   HGBA1C 7.1 01/07/2018   No results found for: PROLACTIN Lab Results  Component Value Date   CHOL 148 03/16/2017   TRIG 59 03/16/2017   HDL 59 03/16/2017   CHOLHDL 2.5 03/16/2017   LDLCALC 77 03/16/2017   LDLCALC 54 05/01/2016     Current Medications: Current Outpatient Medications  Medication Sig Dispense Refill  . albuterol (PROVENTIL HFA;VENTOLIN HFA) 108 (90 Base) MCG/ACT inhaler Inhale 2 puffs into the lungs every 6 (six) hours as needed for wheezing or shortness of breath. Reported on 05/01/2016 1 Inhaler 1  . azelastine (OPTIVAR) 0.05 % ophthalmic solution 1 drop as needed. Reported on 05/01/2016    . calcium-vitamin D 250-100 MG-UNIT tablet Take 1 tablet by mouth 2 (two) times daily. 30 tablet 1  . citalopram (CELEXA) 20 MG tablet Take 1 tablet (20 mg total) by mouth 2 (two) times daily. 180 tablet 1  . fluticasone (FLOVENT HFA) 110 MCG/ACT inhaler Inhale 2 puffs into the lungs 2 (two) times daily. 1 Inhaler 1  . hydrOXYzine (ATARAX/VISTARIL) 25 MG tablet Take 1 tablet (25 mg total) by mouth 3 (three) times daily as needed. 90 tablet 1  . ketoconazole (NIZORAL) 2 % cream Apply 1 application topically daily as needed for irritation.    Marland Kitchen lamoTRIgine (LAMICTAL) 25 MG tablet Take 1 tablet (25 mg total) by mouth daily. 90 tablet 1  . levocetirizine (XYZAL) 5 MG tablet Take 1 tablet (5 mg total) by mouth every evening. Reported on 05/01/2016 30 tablet 1  . montelukast (SINGULAIR) 10 MG tablet Take 10 mg by mouth at bedtime.    . Olopatadine HCl (PATANASE) 0.6 % SOLN Place into the nose as needed. Reported on 05/01/2016    . pantoprazole (PROTONIX) 40 MG tablet Take 1 tablet (40 mg total) by mouth daily. 30 tablet 0  .  predniSONE (DELTASONE) 20 MG tablet Take 2 tablets (40 mg total) by mouth daily with breakfast. (Patient taking differently: Take 40 mg by mouth daily as needed. ) 10 tablet 0  . traZODone (DESYREL) 150 MG tablet Take 1 tablet (150 mg total) by mouth at bedtime. 90 tablet 1   No current facility-administered medications for this visit.     Neurologic: Headache: No Seizure: No Paresthesias:No  Musculoskeletal: Strength & Muscle Tone: within normal limits Gait & Station: normal Patient leans: N/A  Psychiatric Specialty Exam: Review of Systems  Psychiatric/Behavioral: Positive for depression. The patient has insomnia.   All other systems reviewed and are negative.   Blood pressure 135/89, pulse 81, temperature 97.7 F (36.5 C), temperature source Oral, weight 238 lb 12.8 oz (  108.3 kg).Body mass index is 45.12 kg/m.  General Appearance: Casual  Eye Contact:  Fair  Speech:  Clear and Coherent  Volume:  Normal  Mood:  Anxious  Affect:  Congruent  Thought Process:  Coherent  Orientation:  Full (Time, Place, and Person)  Thought Content:  Logical  Suicidal Thoughts:  No  Homicidal Thoughts:  No  Memory:  Immediate;   Fair Recent;   Fair Remote;   Fair  Judgement:  Fair  Insight:  Fair  Psychomotor Activity:  Normal  Concentration:  Concentration: Fair and Attention Span: Fair  Recall:  [employer] of Knowledge:Fair  Language: Fair  Akathisia:  No  Handed:  Right  AIMS (if indicated):    Assets:  Communication Skills Desire for Improvement Physical Health Social Support  ADL's:  Intact  Cognition: WNL  Sleep:  poor    Treatment Plan Summary: Medication management    Discussed with patient what the medications treatment risk benefits and alternatives.  Continue trazodone 150 mg by mouth daily at bedtime  Start lamotrigine 50 mg by mouth daily.  Discussed with her about the side effects including the risk of rash and Trudie Buckler syndrome and she demonstrated  understanding. I will start her on Wellbutrin 100 mg in the morning for her depressive symptoms and discussed with her about side effects and she agreed with the plan.  Follow-up in one month or earlier depending on her symptoms.   Discussed with her about side effects of the medication she demonstrated understanding     More than 50% of the time spent in psychoeducation, counseling and coordination of care.    This note was generated in part or whole with voice recognition software. Voice regonition is usually quite accurate but there are transcription errors that can and very often do occur. I apologize for any typographical errors that were not detected and corrected.    Brandy Hale, MD 2/18/20191:24 PM

## 2018-01-13 ENCOUNTER — Ambulatory Visit: Payer: Managed Care, Other (non HMO) | Admitting: Internal Medicine

## 2018-01-13 ENCOUNTER — Encounter: Payer: Self-pay | Admitting: Internal Medicine

## 2018-01-13 DIAGNOSIS — K219 Gastro-esophageal reflux disease without esophagitis: Secondary | ICD-10-CM | POA: Diagnosis not present

## 2018-01-13 DIAGNOSIS — F439 Reaction to severe stress, unspecified: Secondary | ICD-10-CM | POA: Diagnosis not present

## 2018-01-13 DIAGNOSIS — F331 Major depressive disorder, recurrent, moderate: Secondary | ICD-10-CM | POA: Diagnosis not present

## 2018-01-13 DIAGNOSIS — R059 Cough, unspecified: Secondary | ICD-10-CM

## 2018-01-13 DIAGNOSIS — R05 Cough: Secondary | ICD-10-CM | POA: Diagnosis not present

## 2018-01-13 DIAGNOSIS — E119 Type 2 diabetes mellitus without complications: Secondary | ICD-10-CM

## 2018-01-13 DIAGNOSIS — Z9109 Other allergy status, other than to drugs and biological substances: Secondary | ICD-10-CM | POA: Diagnosis not present

## 2018-01-13 DIAGNOSIS — E559 Vitamin D deficiency, unspecified: Secondary | ICD-10-CM | POA: Diagnosis not present

## 2018-01-13 DIAGNOSIS — I10 Essential (primary) hypertension: Secondary | ICD-10-CM

## 2018-01-13 MED ORDER — PANTOPRAZOLE SODIUM 40 MG PO TBEC: 40.0000 mg | DELAYED_RELEASE_TABLET | Freq: Every day | ORAL | 1 refills | Status: DC

## 2018-01-13 NOTE — Progress Notes (Signed)
Patient ID: Shirley [name], female   DOB: 14-Feb-1981, 37 y.o.   MRN: [id number]   Subjective:    Patient ID: Shirley [name], female    DOB: 03-Mar-1981, 37 y.o.   MRN: [id number]  HPI  Patient here for a scheduled follow up.  Has a history of bariatric surgery.  Has been having issues with increased clearing of her throat and what she initially thought was asthma flares.  Saw Dr Alva Garnet.  Had UGI/barium swallow.  States was told needed surgery and will need EGD prior.  She is gaining weight.  No chest pain.  No abdominal pain.  Bowels moving.  Increased stress.  Work is stressful.  Seeing psychiatry.  On trazodone.  Started lamictal.     Past Medical History:  Diagnosis Date  . Anemia   . Asthma   . Complication of anesthesia    itching after surgery 2005, 2006  . Depression   . Diabetes mellitus (HCC)   . Fatty liver   . GERD (gastroesophageal reflux disease)   . Hypertension   . IBS (irritable bowel syndrome)   . Migraine   . PONV (postoperative nausea and vomiting)    nausea   Past Surgical History:  Procedure Laterality Date  . CHOLECYSTECTOMY  2005  . LAPAROSCOPIC GASTRIC RESTRICTIVE DUODENAL PROCEDURE (DUODENAL SWITCH) N/A 08/28/2015   Procedure: LAPAROSCOPIC GASTRIC RESTRICTIVE DUODENAL PROCEDURE (DUODENAL SWITCH);  Surgeon: [name], MD;  Location: [facility] ORS;  Service: General;  Laterality: N/A;  . 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 Hypertension Mother   . Diabetes Mother   . Asthma Brother   . Breast cancer Neg Hx   . Colon cancer Neg Hx    Social History   Socioeconomic History  . Marital status: Single    Spouse name: None  . Number of children: 0  . Years of education: None  . Highest education level: None  Social Needs  . Financial resource strain: None  . Food insecurity - worry: None  . Food insecurity - inability: None  . Transportation needs - medical: None  . Transportation needs -  non-medical: None  Occupational History    Employer: [employer]  Tobacco Use  . Smoking status: Never Smoker  . Smokeless tobacco: Never Used  Substance and Sexual Activity  . Alcohol use: No    Alcohol/week: 0.0 oz    Comment: social  . Drug use: No  . Sexual activity: Not Currently  Other Topics Concern  . None  Social History Narrative  . None    Outpatient Encounter Medications as of 01/13/2018  Medication Sig  . albuterol (PROVENTIL HFA;VENTOLIN HFA) 108 (90 Base) MCG/ACT inhaler Inhale 2 puffs into the lungs every 6 (six) hours as needed for wheezing or shortness of breath. Reported on 05/01/2016  . azelastine (OPTIVAR) 0.05 % ophthalmic solution 1 drop as needed. Reported on 05/01/2016  . buPROPion (WELLBUTRIN SR) 100 MG 12 hr tablet Take 1 tablet (100 mg total) by mouth daily.  . calcium-vitamin D 250-100 MG-UNIT tablet Take 1 tablet by mouth 2 (two) times daily.  . fluticasone (FLOVENT HFA) 110 MCG/ACT inhaler Inhale 2 puffs into the lungs 2 (two) times daily.  Marland Kitchen ketoconazole (NIZORAL) 2 % cream Apply 1 application topically daily as needed for irritation.  Marland Kitchen lamoTRIgine (LAMICTAL) 25 MG tablet Take 2 tablets (50 mg total) by mouth daily.  Marland Kitchen levocetirizine (XYZAL) 5 MG tablet  Take 1 tablet (5 mg total) by mouth every evening. Reported on 05/01/2016  . montelukast (SINGULAIR) 10 MG tablet Take 10 mg by mouth at bedtime.  . Olopatadine HCl (PATANASE) 0.6 % SOLN Place into the nose as needed. Reported on 05/01/2016  . pantoprazole (PROTONIX) 40 MG tablet Take 1 tablet (40 mg total) by mouth daily.  . traZODone (DESYREL) 150 MG tablet Take 1 tablet (150 mg total) by mouth at bedtime.  . [DISCONTINUED] pantoprazole (PROTONIX) 40 MG tablet Take 1 tablet (40 mg total) by mouth daily.   No facility-administered encounter medications on file as of 01/13/2018.     Review of Systems  Constitutional: Negative for appetite change.       Has gained weight.   HENT: Positive for congestion.  Negative for sinus pressure.   Respiratory: Positive for cough. Negative for chest tightness and shortness of breath.   Cardiovascular: Negative for chest pain, palpitations and leg swelling.  Gastrointestinal: Negative for abdominal pain, diarrhea, nausea and vomiting.  Genitourinary: Negative for difficulty urinating and dysuria.  Musculoskeletal: Negative for joint swelling and myalgias.  Skin: Negative for color change and rash.  Neurological: Negative for dizziness, light-headedness and headaches.  Psychiatric/Behavioral: Negative for agitation and dysphoric mood.       Objective:     Blood pressure rechecked by me:  128/84  Physical Exam  Constitutional: She appears well-developed and well-nourished. No distress.  HENT:  Nose: Nose normal.  Mouth/Throat: Oropharynx is clear and moist.  Neck: Neck supple. No thyromegaly present.  Cardiovascular: Normal rate and regular rhythm.  Pulmonary/Chest: Breath sounds normal. No respiratory distress. She has no wheezes.  Abdominal: Soft. Bowel sounds are normal. There is no tenderness.  Musculoskeletal: She exhibits no edema or tenderness.  Lymphadenopathy:    She has no cervical adenopathy.  Skin: No rash noted. No erythema.  Psychiatric: She has a normal mood and affect. Her behavior is normal.    BP 128/84   Pulse 74   Temp 98.5 F (36.9 C) (Oral)   Resp 16   Wt 243 lb (110.2 kg)   SpO2 97%   BMI 45.91 kg/m  Wt Readings from Last 3 Encounters:  01/13/18 243 lb (110.2 kg)  01/07/18 239 lb 9.6 oz (108.7 kg)  09/16/17 238 lb 12.8 oz (108.3 kg)     Lab Results  Component Value Date   WBC 5.1 03/16/2017   HGB 12.0 03/16/2017   HCT 37.3 03/16/2017   PLT 203 03/16/2017   GLUCOSE 106 (H) 03/16/2017   CHOL 148 03/16/2017   TRIG 59 03/16/2017   HDL 59 03/16/2017   LDLCALC 77 03/16/2017   ALT 17 03/16/2017   AST 18 03/16/2017   NA 137 03/16/2017   K 4.3 03/16/2017   CL 101 03/16/2017   CREATININE 0.84 03/16/2017    BUN 11 03/16/2017   CO2 25 03/16/2017   TSH 1.120 08/10/2015   HGBA1C 7.1 01/07/2018    Ct Head Wo Contrast  Result Date: 11/10/2016 CLINICAL DATA:  Trauma last night. Dizziness and headaches earlier today. EXAM: CT HEAD WITHOUT CONTRAST TECHNIQUE: Contiguous axial images were obtained from the base of the skull through the vertex without intravenous contrast. COMPARISON:  Brain MR of 12/09/2011. FINDINGS: Brain: No mass lesion, hemorrhage, hydrocephalus, acute infarct, intra-axial, or extra-axial fluid collection. Vascular: No hyperdense vessel or unexpected calcification. Skull: No significant soft tissue swelling.  No skull fracture. Sinuses/Orbits: Normal orbits and globes. Clear paranasal sinuses and mastoid air cells. Other: None  IMPRESSION: Normal head CT.  No acute or posttraumatic deformity identified. Electronically Signed   By: Jeronimo GreavesKyle  Talbot M.D.   On: 11/10/2016 17:37       Assessment & Plan:   Problem List Items Addressed This Visit    Cough    Increased congestion and cough.  Felt to be aggravated by her GI issues.  Continue w/up as planned.        Diabetes mellitus (HCC)    On no medication now.  Seeing Dr Wyonia HoughGerghe.  Low carb diet and exercise.  Follow.  Planning for f/u with her gastric surgeon.        Environmental allergies    Followed by Dr Harlan CallasSharma.  Continue current medication regimen.  Follow.        GERD (gastroesophageal reflux disease)    On protonix.  Seeing her gastric surgeon for f/u.        Relevant Medications   pantoprazole (PROTONIX) 40 MG tablet   Hypertension    Blood pressure on recheck improved.  Follow pressures.        Major depressive disorder, recurrent episode, moderate (HCC)    Followed by psychiatry.        Severe obesity (BMI >= 40) (HCC)    Diet and exercise.  Follow.        Stress    Increased stress.  Discussed with her today.  Seeing psychiatry.        Vitamin D deficiency    Follow vitamin D level.           Dale DurhamSCOTT,  Joia Doyle, MD

## 2018-01-16 ENCOUNTER — Encounter: Payer: Self-pay | Admitting: Internal Medicine

## 2018-01-16 NOTE — Assessment & Plan Note (Signed)
Increased congestion and cough.  Felt to be aggravated by her GI issues.  Continue w/up as planned.

## 2018-01-16 NOTE — Assessment & Plan Note (Signed)
Blood pressure on recheck improved.  Follow pressures.   

## 2018-01-16 NOTE — Assessment & Plan Note (Signed)
Follow vitamin D level.  

## 2018-01-16 NOTE — Assessment & Plan Note (Signed)
On no medication now.  Seeing Dr Wyonia HoughGerghe.  Low carb diet and exercise.  Follow.  Planning for f/u with her gastric surgeon.

## 2018-01-16 NOTE — Assessment & Plan Note (Signed)
Diet and exercise.  Follow.  

## 2018-01-16 NOTE — Assessment & Plan Note (Signed)
Followed by Dr [place] CallasSharma.  Continue current medication regimen.  Follow.

## 2018-01-16 NOTE — Assessment & Plan Note (Signed)
Followed by psychiatry 

## 2018-01-16 NOTE — Assessment & Plan Note (Signed)
Increased stress.  Discussed with her today.  Seeing psychiatry.

## 2018-01-16 NOTE — Assessment & Plan Note (Signed)
On protonix.  Seeing her gastric surgeon for f/u.

## 2018-01-18 ENCOUNTER — Telehealth: Payer: Self-pay

## 2018-01-18 NOTE — Telephone Encounter (Signed)
pt states that dr. Lorin Picketscott was going to try and contact Dr. Garnetta BuddyFaheem in regards to getting medication for a upcoming trip for this friday for the patient.  pt usually take ativan  but dr. Lorin Picketscott does nt want to interfer with the treatment .[facility] in [place]

## 2018-02-08 ENCOUNTER — Other Ambulatory Visit: Payer: Self-pay

## 2018-02-08 ENCOUNTER — Ambulatory Visit: Payer: 59 | Admitting: Psychiatry

## 2018-02-08 ENCOUNTER — Encounter: Payer: Self-pay | Admitting: Psychiatry

## 2018-02-08 VITALS — BP 128/88 | Temp 97.9°F | Wt 240.2 lb

## 2018-02-08 DIAGNOSIS — F331 Major depressive disorder, recurrent, moderate: Secondary | ICD-10-CM | POA: Diagnosis not present

## 2018-02-08 DIAGNOSIS — F411 Generalized anxiety disorder: Secondary | ICD-10-CM | POA: Diagnosis not present

## 2018-02-08 MED ORDER — TRAZODONE HCL 150 MG PO TABS: 150.0000 mg | ORAL_TABLET | Freq: Every day | ORAL | 1 refills | Status: DC

## 2018-02-08 MED ORDER — BUPROPION HCL ER (SR) 100 MG PO TB12: 100.0000 mg | ORAL_TABLET | Freq: Every day | ORAL | 1 refills | Status: DC

## 2018-02-08 MED ORDER — LAMOTRIGINE 25 MG PO TABS: 50.0000 mg | ORAL_TABLET | Freq: Every day | ORAL | 1 refills | Status: DC

## 2018-02-08 NOTE — Progress Notes (Signed)
Psychiatric MD Progress Note  Patient Identification: Shirley [name] MRN:  [id number] Date of Evaluation:  02/08/2018 Referral Source: Corrie Dandy- Therapist Chief Complaint:   Chief Complaint    Follow-up; Medication Refill     Visit Diagnosis:    ICD-10-CM   1. MDD (major depressive disorder), recurrent episode, moderate (HCC) F33.1   2. Generalized anxiety disorder F41.1     History of Present Illness:   Patient is a 37 year old female who presented for follow up.   Patient reported that she is doing well on the current combination of medication.  She reported that lamotrigine is helping her mood and she is sleeping well with the help of trazodone.  She has been taking Wellbutrin in the morning and it is giving her more energy.  Currently works in the [employer] and stated that she has occasional issues with her coworkers.  However she is able to handle her issues with her coworkers.  We discussed about her medications in detail.  She denied having any perceptual disturbances.        Associated Signs/Symptoms: Depression Symptoms:  anxiety, disturbed sleep, (Hypo) Manic Symptoms:  Labiality of Mood, Anxiety Symptoms:  Excessive Worry, Psychotic Symptoms:  none PTSD Symptoms: Negative NA  Past Psychiatric History:  H/o depression. She has tried several medications in the past. She reported that the medications are not effective at this time. No h/o suicide attempts.   Previous Psychotropic Medications:  Ambien  Melatonin Celexa Ativan Zoloft Abilify- Dizziness   Substance Abuse History in the last 12 months:  Yes.   Social drinker  Consequences of Substance Abuse: Negative NA  Past Medical History:  Past Medical History:  Diagnosis Date  . Anemia   . Asthma   . Complication of anesthesia    itching after surgery 2005, 2006  . Depression   . Diabetes mellitus (HCC)   . Fatty liver   . GERD (gastroesophageal reflux disease)   . Hypertension   . IBS  (irritable bowel syndrome)   . Migraine   . PONV (postoperative nausea and vomiting)    nausea    Past Surgical History:  Procedure Laterality Date  . CHOLECYSTECTOMY  2005  . LAPAROSCOPIC GASTRIC RESTRICTIVE DUODENAL PROCEDURE (DUODENAL SWITCH) N/A 08/28/2015   Procedure: LAPAROSCOPIC GASTRIC RESTRICTIVE DUODENAL PROCEDURE (DUODENAL SWITCH);  Surgeon: [name], MD;  Location: [facility] ORS;  Service: General;  Laterality: N/A;  . 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 Hypertension Mother   . Diabetes Mother   . Asthma Brother   . Breast cancer Neg Hx   . Colon cancer Neg Hx     Social History:   Social History   Socioeconomic History  . Marital status: Single    Spouse name: None  . Number of children: 0  . Years of education: None  . Highest education level: None  Social Needs  . Financial resource strain: None  . Food insecurity - worry: None  . Food insecurity - inability: None  . Transportation needs - medical: None  . Transportation needs - non-medical: None  Occupational History    Employer: [employer]  Tobacco Use  . Smoking status: Never Smoker  . Smokeless tobacco: Never Used  Substance and Sexual Activity  . Alcohol use: No    Alcohol/week: 0.0 oz    Comment: social  . Drug use: No  .  Sexual activity: Not Currently  Other Topics Concern  . None  Social History Narrative  . None    Additional Social History:  [employer]- works 50 plus hours.  Had duodenal switch at [facility]- last Oct- lost 50 lbs since then.   Lives with cousin. Never married, no children.  Family lives in [place].    Allergies:   Allergies  Allergen Reactions  . Ibuprofen Other (See Comments)    Upset stomach Stomach Issues Upset stomach  Stomach Issues  Stomach Issues   . Caffeine Other (See Comments)    Stomach issues    Metabolic Disorder Labs: Lab Results   Component Value Date   HGBA1C 7.1 01/07/2018   No results found for: PROLACTIN Lab Results  Component Value Date   CHOL 148 03/16/2017   TRIG 59 03/16/2017   HDL 59 03/16/2017   CHOLHDL 2.5 03/16/2017   LDLCALC 77 03/16/2017   LDLCALC 54 05/01/2016     Current Medications: Current Outpatient Medications  Medication Sig Dispense Refill  . albuterol (PROVENTIL HFA;VENTOLIN HFA) 108 (90 Base) MCG/ACT inhaler Inhale 2 puffs into the lungs every 6 (six) hours as needed for wheezing or shortness of breath. Reported on 05/01/2016 1 Inhaler 1  . azelastine (OPTIVAR) 0.05 % ophthalmic solution 1 drop as needed. Reported on 05/01/2016    . buPROPion (WELLBUTRIN SR) 100 MG 12 hr tablet Take 1 tablet (100 mg total) by mouth daily. 30 tablet 1  . calcium-vitamin D 250-100 MG-UNIT tablet Take 1 tablet by mouth 2 (two) times daily. 30 tablet 1  . fluticasone (FLOVENT HFA) 110 MCG/ACT inhaler Inhale 2 puffs into the lungs 2 (two) times daily. 1 Inhaler 1  . ketoconazole (NIZORAL) 2 % cream Apply 1 application topically daily as needed for irritation.    Marland Kitchen lamoTRIgine (LAMICTAL) 25 MG tablet Take 2 tablets (50 mg total) by mouth daily. 60 tablet 1  . levocetirizine (XYZAL) 5 MG tablet Take 1 tablet (5 mg total) by mouth every evening. Reported on 05/01/2016 30 tablet 1  . montelukast (SINGULAIR) 10 MG tablet Take 10 mg by mouth at bedtime.    . Olopatadine HCl (PATANASE) 0.6 % SOLN Place into the nose as needed. Reported on 05/01/2016    . pantoprazole (PROTONIX) 40 MG tablet Take 1 tablet (40 mg total) by mouth daily. 30 tablet 1  . traZODone (DESYREL) 150 MG tablet Take 1 tablet (150 mg total) by mouth at bedtime. 90 tablet 1   No current facility-administered medications for this visit.     Neurologic: Headache: No Seizure: No Paresthesias:No  Musculoskeletal: Strength & Muscle Tone: within normal limits Gait & Station: normal Patient leans: N/A  Psychiatric Specialty Exam: Review of Systems   Psychiatric/Behavioral: Positive for depression. The patient has insomnia.   All other systems reviewed and are negative.   Blood pressure 128/88, temperature 97.9 F (36.6 C), temperature source Oral, weight 240 lb 3.2 oz (109 kg).Body mass index is 45.39 kg/m.  General Appearance: Casual  Eye Contact:  Fair  Speech:  Clear and Coherent  Volume:  Normal  Mood:  Anxious  Affect:  Congruent  Thought Process:  Coherent  Orientation:  Full (Time, Place, and Person)  Thought Content:  Logical  Suicidal Thoughts:  No  Homicidal Thoughts:  No  Memory:  Immediate;   Fair Recent;   Fair Remote;   Fair  Judgement:  Fair  Insight:  Fair  Psychomotor Activity:  Normal  Concentration:  Concentration: Fair and  Attention Span: Fair  Recall:  Jennelle HumanFair  Fund of Knowledge:Fair  Language: Fair  Akathisia:  No  Handed:  Right  AIMS (if indicated):    Assets:  Communication Skills Desire for Improvement Physical Health Social Support  ADL's:  Intact  Cognition: WNL  Sleep:  poor    Treatment Plan Summary: Medication management    Discussed with patient what the medications treatment risk benefits and alternatives.  Continue trazodone 150 mg by mouth daily at bedtime  Continue lamotrigine 50 mg by mouth daily.  Discussed with her about the side effects including the risk of rash and Trudie BucklerSteven Johnson syndrome and she demonstrated understanding.  Wellbutrin 100 mg in the morning for her depressive symptoms and discussed with her about side effects and she agreed with the plan.  Follow-up in 2 month or earlier depending on her symptoms.   Discussed with her about side effects of the medication she demonstrated understanding     More than 50% of the time spent in psychoeducation, counseling and coordination of care.    This note was generated in part or whole with voice recognition software. Voice regonition is usually quite accurate but there are transcription errors that can and very  often do occur. I apologize for any typographical errors that were not detected and corrected.    Brandy HaleUzma Marlys Stegmaier, MD 3/18/201912:36 PM

## 2018-02-26 ENCOUNTER — Ambulatory Visit: Payer: Self-pay | Admitting: Internal Medicine

## 2018-03-01 ENCOUNTER — Encounter: Payer: Self-pay | Admitting: Internal Medicine

## 2018-03-02 ENCOUNTER — Encounter: Payer: Self-pay | Admitting: Internal Medicine

## 2018-03-02 DIAGNOSIS — R102 Pelvic and perineal pain: Secondary | ICD-10-CM

## 2018-03-04 NOTE — Telephone Encounter (Signed)
Order placed for gyn referral.  

## 2018-03-05 ENCOUNTER — Encounter: Payer: Self-pay | Admitting: Obstetrics and Gynecology

## 2018-03-05 ENCOUNTER — Ambulatory Visit: Payer: Managed Care, Other (non HMO) | Admitting: Obstetrics and Gynecology

## 2018-03-05 VITALS — BP 128/92 | HR 77 | Ht 61.0 in | Wt 241.0 lb

## 2018-03-05 DIAGNOSIS — R102 Pelvic and perineal pain: Secondary | ICD-10-CM | POA: Diagnosis not present

## 2018-03-05 DIAGNOSIS — N760 Acute vaginitis: Secondary | ICD-10-CM

## 2018-03-05 DIAGNOSIS — Z124 Encounter for screening for malignant neoplasm of cervix: Secondary | ICD-10-CM | POA: Diagnosis not present

## 2018-03-05 NOTE — Progress Notes (Signed)
Patient ID: Shirley [name], female   DOB: 1981/04/01, 37 y.o.   MRN: [id number]  Reason for Consult: Referral (pelvic pain)   Referred by Dale DurhamScott, Charlene, MD  Subjective:     HPI:  Shirley [name] is a 37 y.o. female . She presents today with complaints of pelvic pain that started this week . She says that she has had a small amount of left sided pain for several years since her dermoid removal in 2006, but this week she had new right sided pain. She described the pain as constant and throbbing. She says Tuesday and Wednesday it was the worse ans she felt incapacitated by the pain. Today it is somewhat improved but still present. She also reports vaginal/vulvar tenderness. She has had BV in the past and is concerned this infection may be present again. She would like to be tested. Declines testing for sexually transmitted infections.   Gynecological History Regular monthly menses every 28-30 days. Menses last 405 days in duration, normal flow. No sexually active. No contraception used. No history of abnormal pap smears. No history of sexually transmitted infections.  Past Medical History:  Diagnosis Date  . Anemia   . Asthma   . Complication of anesthesia    itching after surgery 2005, 2006  . Depression   . Diabetes mellitus (HCC)   . Fatty liver   . GERD (gastroesophageal reflux disease)   . History of dermoid cyst excision   . Hypertension   . IBS (irritable bowel syndrome)   . Migraine   . PONV (postoperative nausea and vomiting)    nausea   Family History  Problem Relation Age of Onset  . Hypertension Mother   . Diabetes Mother   . Asthma Brother   . Breast cancer Neg Hx   . Colon cancer Neg Hx    Past Surgical History:  Procedure Laterality Date  . CHOLECYSTECTOMY  2005  . LAPAROSCOPIC GASTRIC RESTRICTIVE DUODENAL PROCEDURE (DUODENAL SWITCH) N/A 08/28/2015   Procedure: LAPAROSCOPIC GASTRIC RESTRICTIVE DUODENAL PROCEDURE (DUODENAL SWITCH);  Surgeon: Everette RankMichael A  Tyner, MD;  Location: [facility] ORS;  Service: General;  Laterality: N/A;  . 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 Smoking status: Never Smoker  . Smokeless tobacco: Never Used  Substance Use Topics  . Alcohol use: No    Alcohol/week: 0.0 oz    Comment: social    Allergies  Allergen Reactions  . Ibuprofen Other (See Comments)    Upset stomach Stomach Issues Upset stomach  Stomach Issues  Stomach Issues   . Caffeine Other (See Comments)    Stomach issues    Current Outpatient Medications  Medication Sig Dispense Refill  . albuterol (PROVENTIL HFA;VENTOLIN HFA) 108 (90 Base) MCG/ACT inhaler Inhale 2 puffs into the lungs every 6 (six) hours as needed for wheezing or shortness of breath. Reported on 05/01/2016 1 Inhaler 1  . azelastine (OPTIVAR) 0.05 % ophthalmic solution 1 drop as needed. Reported on 05/01/2016    . buPROPion (WELLBUTRIN SR) 100 MG 12 hr tablet Take 1 tablet (100 mg total) by mouth daily. 90 tablet 1  . calcium-vitamin D 250-100 MG-UNIT tablet Take 1 tablet by mouth 2 (two) times daily. 30 tablet 1  . Fluocinolone Acetonide Body 0.01 % OIL Apply topically.    . fluticasone (FLOVENT HFA) 110 MCG/ACT inhaler Inhale into the lungs.    Marland Kitchen. glucose blood (PRECISION QID TEST)  test strip Use 1 strip  twice a day  [dx 250.02]    . ketoconazole (NIZORAL) 2 % shampoo WASH SCALP 2 TO 3 TIMES Q WEEK. LEAVE IN FOR 5 MIN B RINSING OUT  3  . lamoTRIgine (LAMICTAL) 25 MG tablet Take 2 tablets (50 mg total) by mouth daily. 180 tablet 1  . levocetirizine (XYZAL) 5 MG tablet Take 1 tablet (5 mg total) by mouth every evening. Reported on 05/01/2016 30 tablet 1  . montelukast (SINGULAIR) 10 MG tablet Take 10 mg by mouth at bedtime.    . Olopatadine HCl (PATANASE) 0.6 % SOLN Place into the nose as needed. Reported on 05/01/2016    . pantoprazole (PROTONIX) 40 MG tablet Take 1 tablet (40 mg total) by mouth daily. 30 tablet 1  . traZODone  (DESYREL) 150 MG tablet Take 1 tablet (150 mg total) by mouth at bedtime. 90 tablet 1  . triamcinolone ointment (KENALOG) 0.1 % APPLY BID AA ONLY WHEN NEEDED  6   No current facility-administered medications for this visit.     Review of Systems  Constitutional: Negative for chills, fatigue, fever and unexpected weight change.  HENT: Negative for trouble swallowing.  Eyes: Negative for loss of vision.  Respiratory: Negative for cough, shortness of breath and wheezing.  Cardiovascular: Negative for chest pain, leg swelling, palpitations and syncope.  GI: Negative for abdominal pain, blood in stool, diarrhea, nausea and vomiting.  GU: Negative for difficulty urinating, dysuria, frequency and hematuria.  Musculoskeletal: Negative for back pain, leg pain and joint pain.  Skin: Negative for rash.  Neurological: Negative for dizziness, headaches, light-headedness, numbness and seizures.  Psychiatric: Negative for behavioral problem, confusion, depressed mood and sleep disturbance.       +anxiety       Objective:  Objective   Vitals:   03/05/18 1502  BP: (!) 128/92  Pulse: 77  Weight: 241 lb (109.3 kg)  Height: 5\' 1"  (1.549 m)   Body mass index is 45.54 kg/m.  Physical Exam  Constitutional: She is oriented to person, place, and time. She appears well-developed and well-nourished.  HENT:  Head: Normocephalic and atraumatic.  Eyes: EOM are normal.  Cardiovascular: Normal rate, regular rhythm and normal heart sounds.  Pulmonary/Chest: Effort normal and breath sounds normal.  Genitourinary: Uterus normal. There is no rash, tenderness, lesion or injury on the right labia. There is no rash, tenderness, lesion or injury on the left labia. Cervix exhibits no motion tenderness. Right adnexum displays no mass, no tenderness and no fullness. Left adnexum displays no mass, no tenderness and no fullness. No erythema or tenderness in the vagina. No foreign body in the vagina. No signs of injury  around the vagina. No vaginal discharge found.  Genitourinary Comments: Exam limited by body habitus.   Neurological: She is alert and oriented to person, place, and time.  Skin: Skin is warm and dry.  Psychiatric: She has a normal mood and affect. Her behavior is normal. Judgment and thought content normal.  Nursing note and vitals reviewed.        Assessment/Plan:     37 yo with new onset pelvic pain. 1. Will have patient return in 1 week to evaluate pelvis with a transvaginal US 2. Nuswab sent today for patient concerns of vaginitis 3. Pap today for health maintenance.  Adelene Idler MD [facility], [facility] 03/05/18 3:44 PM

## 2018-03-08 ENCOUNTER — Telehealth: Payer: Self-pay

## 2018-03-08 NOTE — Telephone Encounter (Signed)
Called pt. No answer °

## 2018-03-09 ENCOUNTER — Other Ambulatory Visit: Payer: Self-pay | Admitting: Internal Medicine

## 2018-03-09 LAB — NUSWAB BV AND CANDIDA, NAA
Candida albicans, NAA: NEGATIVE
Candida glabrata, NAA: NEGATIVE

## 2018-03-09 LAB — PAPIG, HPV, RFX 16/18
HPV, high-risk: NEGATIVE
PAP Smear Comment: 0

## 2018-03-09 MED ORDER — METFORMIN HCL ER 500 MG PO TB24: 500.0000 mg | ORAL_TABLET | Freq: Two times a day (BID) | ORAL | 3 refills | Status: DC

## 2018-03-09 NOTE — Progress Notes (Signed)
Normal pap, repeat in 5 years. Called and left message on 03/09/18 at 13:50. Asked patient to check mychart for results.

## 2018-03-10 ENCOUNTER — Ambulatory Visit (INDEPENDENT_AMBULATORY_CARE_PROVIDER_SITE_OTHER): Payer: Managed Care, Other (non HMO)

## 2018-03-10 ENCOUNTER — Ambulatory Visit: Payer: Managed Care, Other (non HMO) | Admitting: Obstetrics and Gynecology

## 2018-03-10 ENCOUNTER — Encounter: Payer: Self-pay | Admitting: Obstetrics and Gynecology

## 2018-03-10 VITALS — BP 120/80 | Ht 61.0 in | Wt 241.0 lb

## 2018-03-10 DIAGNOSIS — R109 Unspecified abdominal pain: Secondary | ICD-10-CM

## 2018-03-10 DIAGNOSIS — R102 Pelvic and perineal pain: Secondary | ICD-10-CM | POA: Diagnosis not present

## 2018-03-10 DIAGNOSIS — N840 Polyp of corpus uteri: Secondary | ICD-10-CM | POA: Diagnosis not present

## 2018-03-10 NOTE — Progress Notes (Signed)
Patient ID: ADIRA LIMBURG, female   DOB: [birth year], 37 y.o.   MRN: [id number]  Reason for Consult: Pelvic Pain   Referred by Dale Le[place]towne, MD  Subjective:     HPI:  BRINA UMEDA is a 37 y.o. female she is being followed for new onset left sided pelvic pain. She reports that the pain has continued. She adds today that she has noticed that it is associated with bowel movements. She will have a sharp pain just before needing to use the bathroom.   Past Medical History:  Diagnosis Date  . Anemia   . Asthma   . Complication of anesthesia    itching after surgery 2005, 2006  . Depression   . Diabetes mellitus (HCC)   . Fatty liver   . GERD (gastroesophageal reflux disease)   . History of dermoid cyst excision   . Hypertension   . IBS (irritable bowel syndrome)   . Migraine   . PONV (postoperative nausea and vomiting)    nausea   Family History  Problem Relation Age of Onset  . Hypertension Mother   . Diabetes Mother   . Asthma Brother   . Breast cancer Neg Hx   . Colon cancer Neg Hx    Past Surgical History:  Procedure Laterality Date  . CHOLECYSTECTOMY  2005  . LAPAROSCOPIC GASTRIC RESTRICTIVE DUODENAL PROCEDURE (DUODENAL SWITCH) N/A 08/28/2015   Procedure: LAPAROSCOPIC GASTRIC RESTRICTIVE DUODENAL PROCEDURE (DUODENAL SWITCH);  Surgeon: [name], MD;  Location: [facility] ORS;  Service: General;  Laterality: N/A;  . 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 Smoking status: Never Smoker  . Smokeless tobacco: Never Used  Substance Use Topics  . Alcohol use: No    Alcohol/week: 0.0 oz    Comment: social    Allergies  Allergen Reactions  . Ibuprofen Other (See Comments)    Upset stomach Stomach Issues Upset stomach  Stomach Issues  Stomach Issues   . Caffeine Other (See Comments)    Stomach issues    Current Outpatient Medications  Medication Sig Dispense Refill  . albuterol (PROVENTIL  HFA;VENTOLIN HFA) 108 (90 Base) MCG/ACT inhaler Inhale 2 puffs into the lungs every 6 (six) hours as needed for wheezing or shortness of breath. Reported on 05/01/2016 1 Inhaler 1  . azelastine (OPTIVAR) 0.05 % ophthalmic solution 1 drop as needed. Reported on 05/01/2016    . buPROPion (WELLBUTRIN SR) 100 MG 12 hr tablet Take 1 tablet (100 mg total) by mouth daily. 90 tablet 1  . calcium-vitamin D 250-100 MG-UNIT tablet Take 1 tablet by mouth 2 (two) times daily. 30 tablet 1  . Fluocinolone Acetonide Body 0.01 % OIL Apply topically.    . fluticasone (FLOVENT HFA) 110 MCG/ACT inhaler Inhale into the lungs.    Marland Kitchen glucose blood (PRECISION QID TEST) test strip Use 1 strip  twice a day  [dx 250.02]    . ketoconazole (NIZORAL) 2 % shampoo WASH SCALP 2 TO 3 TIMES Q WEEK. LEAVE IN FOR 5 MIN B RINSING OUT  3  . lamoTRIgine (LAMICTAL) 25 MG tablet Take 2 tablets (50 mg total) by mouth daily. 180 tablet 1  . levocetirizine (XYZAL) 5 MG tablet Take 1 tablet (5 mg total) by mouth every evening. Reported on 05/01/2016 30 tablet 1  . metFORMIN (GLUCOPHAGE-XR) 500 MG 24 hr tablet Take 1 tablet (500 mg total) by mouth 2 (two) times  daily with a meal. 180 tablet 3  . montelukast (SINGULAIR) 10 MG tablet Take 10 mg by mouth at bedtime.    . Olopatadine HCl (PATANASE) 0.6 % SOLN Place into the nose as needed. Reported on 05/01/2016    . pantoprazole (PROTONIX) 40 MG tablet Take 1 tablet (40 mg total) by mouth daily. 30 tablet 1  . traZODone (DESYREL) 150 MG tablet Take 1 tablet (150 mg total) by mouth at bedtime. 90 tablet 1  . triamcinolone ointment (KENALOG) 0.1 % APPLY BID AA ONLY WHEN NEEDED  6   No current facility-administered medications for this visit.     Review of Systems  Constitutional: Negative for chills, fatigue, fever and unexpected weight change.  HENT: Negative for trouble swallowing.  Eyes: Negative for loss of vision.  Respiratory: Negative for cough, shortness of breath and wheezing.    Cardiovascular: Negative for chest pain, leg swelling, palpitations and syncope.  GI: Positive for abdominal pain. Negative for blood in stool, diarrhea, nausea and vomiting.  GU: Negative for difficulty urinating, dysuria, frequency and hematuria.  Musculoskeletal: Negative for back pain, leg pain and joint pain.  Skin: Negative for rash.  Neurological: Negative for dizziness, headaches, light-headedness, numbness and seizures.  Psychiatric: Negative for behavioral problem, confusion, depressed mood and sleep disturbance.        Objective:  Objective   Vitals:   03/10/18 1020  BP: 120/80  Weight: 241 lb (109.3 kg)  Height: 5\' 1"  (1.549 m)   Body mass index is 45.54 kg/m.  Physical Exam  Constitutional: She is oriented to person, place, and time. She appears well-developed and well-nourished.  HENT:  Head: Normocephalic and atraumatic.  Eyes: EOM are normal.  Neck: Neck supple.  Cardiovascular: Normal rate and regular rhythm.  Pulmonary/Chest: Effort normal. No respiratory distress. She has no wheezes.  Neurological: She is alert and oriented to person, place, and time.  Skin: Skin is warm and dry.  Psychiatric: She has a normal mood and affect. Her behavior is normal. Judgment and thought content normal.  Nursing note and vitals reviewed.   [place] Pelvis Transvanginal Non-ob (tv Only)  Result Date: 03/11/2018 ULTRASOUND REPORT Patient Name: [name] DOB: 1981/10/29 MRN: [id number] Location: [facility] Date of Service: 03/10/2018 Indications:Pelvic Pain Findings: The uterus is anteverted and measures 6.71 x 4.31 x 3.40cm. Echo texture is homogenous without evidence of focal masses. The Endometrium measures 5.70 mm. (tri-laminar appearance) - appears to be an echogenic area with a single feeding artery, suggesting an endometrial polyp - measures 1.10 x 0.58cm Right Ovary measures 3.66 x 3.53 x 2.28 cm. It is normal in appearance. Left Ovary measures 2.39 x 2.39 x 2.56 cm.  It is normal in appearance. Survey of the adnexa demonstrates no adnexal masses. There is no free fluid in the cul de sac. Impression: 1. Endometrial polyp Recommendations: 1.Clinical correlation with the patient's History and Physical Exam. Willette AlmaKristen Priestley, RDMS, RVT I have reviewed this ultrasound and the report. I agree with the above assessment and plan. Adelene Idlerhristanna Liboria Putnam MD [facility] 03/11/18 6:50 PM         Assessment/Plan:    37 yo with left sided pelvic pain, incidental endometrial polyp.  Will schedule D&C hysteroscopy polypectomy for patient. Recommended patient see gastroenterology  For evaluation of her left sided pain which is assoociated with bowel movements. Sh  Adelene Idlerhristanna Estha Few MD [facility], [facility] 03/10/18 10:50 AM

## 2018-03-10 NOTE — H&P (View-Only) (Signed)
Patient ID: Shirley [name], female   DOB: [birth year], 37 y.o.   MRN: [id number]  Reason for Consult: Pelvic Pain   Referred by Dale , MD  Subjective:     HPI:  Shirley [name] is a 37 y.o. female she is being followed for new onset left sided pelvic pain. She reports that the pain has continued. She adds today that she has noticed that it is associated with bowel movements. She will have a sharp pain just before needing to use the bathroom.   Past Medical History:  Diagnosis Date  . Anemia   . Asthma   . Complication of anesthesia    itching after surgery 2005, 2006  . Depression   . Diabetes mellitus (HCC)   . Fatty liver   . GERD (gastroesophageal reflux disease)   . History of dermoid cyst excision   . Hypertension   . IBS (irritable bowel syndrome)   . Migraine   . PONV (postoperative nausea and vomiting)    nausea   Family History  Problem Relation Age of Onset  . Hypertension Mother   . Diabetes Mother   . Asthma Brother   . Breast cancer Neg Hx   . Colon cancer Neg Hx    Past Surgical History:  Procedure Laterality Date  . CHOLECYSTECTOMY  2005  . LAPAROSCOPIC GASTRIC RESTRICTIVE DUODENAL PROCEDURE (DUODENAL SWITCH) N/A 08/28/2015   Procedure: LAPAROSCOPIC GASTRIC RESTRICTIVE DUODENAL PROCEDURE (DUODENAL SWITCH);  Surgeon: [name], MD;  Location: [facility] ORS;  Service: General;  Laterality: N/A;  . 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 Smoking status: Never Smoker  . Smokeless tobacco: Never Used  Substance Use Topics  . Alcohol use: No    Alcohol/week: 0.0 oz    Comment: social    Allergies  Allergen Reactions  . Ibuprofen Other (See Comments)    Upset stomach Stomach Issues Upset stomach  Stomach Issues  Stomach Issues   . Caffeine Other (See Comments)    Stomach issues    Current Outpatient Medications  Medication Sig Dispense Refill  . albuterol (PROVENTIL  HFA;VENTOLIN HFA) 108 (90 Base) MCG/ACT inhaler Inhale 2 puffs into the lungs every 6 (six) hours as needed for wheezing or shortness of breath. Reported on 05/01/2016 1 Inhaler 1  . azelastine (OPTIVAR) 0.05 % ophthalmic solution 1 drop as needed. Reported on 05/01/2016    . buPROPion (WELLBUTRIN SR) 100 MG 12 hr tablet Take 1 tablet (100 mg total) by mouth daily. 90 tablet 1  . calcium-vitamin D 250-100 MG-UNIT tablet Take 1 tablet by mouth 2 (two) times daily. 30 tablet 1  . Fluocinolone Acetonide Body 0.01 % OIL Apply topically.    . fluticasone (FLOVENT HFA) 110 MCG/ACT inhaler Inhale into the lungs.    Marland Kitchen glucose blood (PRECISION QID TEST) test strip Use 1 strip  twice a day  [dx 250.02]    . ketoconazole (NIZORAL) 2 % shampoo WASH SCALP 2 TO 3 TIMES Q WEEK. LEAVE IN FOR 5 MIN B RINSING OUT  3  . lamoTRIgine (LAMICTAL) 25 MG tablet Take 2 tablets (50 mg total) by mouth daily. 180 tablet 1  . levocetirizine (XYZAL) 5 MG tablet Take 1 tablet (5 mg total) by mouth every evening. Reported on 05/01/2016 30 tablet 1  . metFORMIN (GLUCOPHAGE-XR) 500 MG 24 hr tablet Take 1 tablet (500 mg total) by mouth 2 (two) times  daily with a meal. 180 tablet 3  . montelukast (SINGULAIR) 10 MG tablet Take 10 mg by mouth at bedtime.    . Olopatadine HCl (PATANASE) 0.6 % SOLN Place into the nose as needed. Reported on 05/01/2016    . pantoprazole (PROTONIX) 40 MG tablet Take 1 tablet (40 mg total) by mouth daily. 30 tablet 1  . traZODone (DESYREL) 150 MG tablet Take 1 tablet (150 mg total) by mouth at bedtime. 90 tablet 1  . triamcinolone ointment (KENALOG) 0.1 % APPLY BID AA ONLY WHEN NEEDED  6   No current facility-administered medications for this visit.     Review of Systems  Constitutional: Negative for chills, fatigue, fever and unexpected weight change.  HENT: Negative for trouble swallowing.  Eyes: Negative for loss of vision.  Respiratory: Negative for cough, shortness of breath and wheezing.    Cardiovascular: Negative for chest pain, leg swelling, palpitations and syncope.  GI: Positive for abdominal pain. Negative for blood in stool, diarrhea, nausea and vomiting.  GU: Negative for difficulty urinating, dysuria, frequency and hematuria.  Musculoskeletal: Negative for back pain, leg pain and joint pain.  Skin: Negative for rash.  Neurological: Negative for dizziness, headaches, light-headedness, numbness and seizures.  Psychiatric: Negative for behavioral problem, confusion, depressed mood and sleep disturbance.        Objective:  Objective   Vitals:   03/10/18 1020  BP: 120/80  Weight: 241 lb (109.3 kg)  Height: 5\' 1"  (1.549 m)   Body mass index is 45.54 kg/m.  Physical Exam  Constitutional: She is oriented to person, place, and time. She appears well-developed and well-nourished.  HENT:  Head: Normocephalic and atraumatic.  Eyes: EOM are normal.  Neck: Neck supple.  Cardiovascular: Normal rate and regular rhythm.  Pulmonary/Chest: Effort normal. No respiratory distress. She has no wheezes.  Neurological: She is alert and oriented to person, place, and time.  Skin: Skin is warm and dry.  Psychiatric: She has a normal mood and affect. Her behavior is normal. Judgment and thought content normal.  Nursing note and vitals reviewed.   [place] Pelvis Transvanginal Non-ob (tv Only)  Result Date: 03/11/2018 ULTRASOUND REPORT Patient Name: Shirley [name] DOB: 1981/10/29 MRN: [id number] Location: [facility] Date of Service: 03/10/2018 Indications:Pelvic Pain Findings: The uterus is anteverted and measures 6.71 x 4.31 x 3.40cm. Echo texture is homogenous without evidence of focal masses. The Endometrium measures 5.70 mm. (tri-laminar appearance) - appears to be an echogenic area with a single feeding artery, suggesting an endometrial polyp - measures 1.10 x 0.58cm Right Ovary measures 3.66 x 3.53 x 2.28 cm. It is normal in appearance. Left Ovary measures 2.39 x 2.39 x 2.56 cm.  It is normal in appearance. Survey of the adnexa demonstrates no adnexal masses. There is no free fluid in the cul de sac. Impression: 1. Endometrial polyp Recommendations: 1.Clinical correlation with the patient's History and Physical Exam. Willette AlmaKristen Priestley, RDMS, RVT I have reviewed this ultrasound and the report. I agree with the above assessment and plan. Adelene Idlerhristanna Dioselina Brumbaugh MD [facility] 03/11/18 6:50 PM         Assessment/Plan:    37 yo with left sided pelvic pain, incidental endometrial polyp.  Will schedule D&C hysteroscopy polypectomy for patient. Recommended patient see gastroenterology  For evaluation of her left sided pain which is assoociated with bowel movements. Sh  Adelene Idlerhristanna Keymani Glynn MD [facility], [facility] 03/10/18 10:50 AM

## 2018-03-11 ENCOUNTER — Telehealth: Payer: Self-pay | Admitting: Obstetrics and Gynecology

## 2018-03-11 NOTE — Telephone Encounter (Signed)
Patient is aware she will sign consents w/ Dr. Jerene PitchSchuman day of surgery, Pre-admit Testing phone interview to be scheduled, and OR on 03/30/18. Patient is aware [facility]. Patient would like to know how many days she needs to ask off from work. Ext given.

## 2018-03-11 NOTE — Telephone Encounter (Signed)
-----   Message from Natale Milchhristanna R Schuman, MD sent at 03/10/2018 10:44 AM EDT ----- Surgery Booking Request Patient Full Name:  No patient name on file.  MRN: No patient ID available  DOB: There is no date of birth on file.  Surgeon: Natale Milchhristanna R Schuman, MD  Requested Surgery Date and Time: 03/10/18 Primary Diagnosis AND Code: endometrial polyp Secondary Diagnosis and Code:  Surgical Procedure: hysteroscopy dilation and curettage, polypectomy L&D Notification: No Admission Status: same day surgery Length of Surgery: 1 hour Special Case Needs: none H&P: 03/10/18 (date) Phone Interview???: yes Interpreter: Language:  Medical Clearance: none Special Scheduling Instructions: May 2019

## 2018-03-11 NOTE — Telephone Encounter (Signed)
3 days off of work.

## 2018-03-15 ENCOUNTER — Encounter: Payer: Self-pay | Admitting: Gastroenterology

## 2018-03-15 NOTE — Telephone Encounter (Signed)
Patient is aware she will need 3 days off from work and [employer]-admit Testing phone interview. Also, patient said she will bring FMLA paperwork today.

## 2018-03-16 ENCOUNTER — Telehealth: Payer: Self-pay

## 2018-03-16 NOTE — Telephone Encounter (Signed)
FMLA/DISABILITY forms (2) for [employer] filled out, signature obtained, and given to [place] for processing. 

## 2018-03-23 ENCOUNTER — Encounter
Admission: RE | Admit: 2018-03-23 | Discharge: 2018-03-23 | Disposition: A | Discharge status: Home / Self Care | Payer: Managed Care, Other (non HMO) | Source: Ambulatory Visit | Attending: Obstetrics and Gynecology | Admitting: Obstetrics and Gynecology

## 2018-03-23 ENCOUNTER — Other Ambulatory Visit: Payer: Self-pay

## 2018-03-23 HISTORY — DX: Cardiac murmur, unspecified: R01.1

## 2018-03-23 HISTORY — DX: Personal history of other diseases of the digestive system: Z87.19

## 2018-03-23 NOTE — Patient Instructions (Signed)
Your procedure is scheduled on:  03-30-18 TUESDAY Report to Same Day Surgery 2nd floor medical mall [facility] Entrance-take elevator on left to 2nd floor.  Check in with surgery information desk.) To find out your arrival time please call [phone] between 1PM - 3PM on 03-29-18 MONDAY   Remember: Instructions that are not followed completely may result in serious medical risk, up to and including death, or upon the discretion of your surgeon and anesthesiologist your surgery may need to be rescheduled.    _x___ 1. Do not eat food after midnight the night before your procedure. NO GUM OR CANDY AFTER MIDNIGHT.  You may drink WATER up to 2 hours before you are scheduled to arrive at the hospital for your procedure.  Do not drink WATER within 2 hours of your scheduled arrival to the hospital.  Type 1 and type 2 diabetics should only drink water.     __x__ 2. No Alcohol for 24 hours before or after surgery.   __x__3. No Smoking or e-cigarettes for 24 prior to surgery.  Do not use any chewable tobacco products for at least 6 hour prior to surgery   ____  4. Bring all medications with you on the day of surgery if instructed.    __x__ 5. Notify your doctor if there is any change in your medical condition     (cold, fever, infections).    x___6. On the morning of surgery brush your teeth with toothpaste and water.  You may rinse your mouth with mouth wash if you wish.  Do not swallow any toothpaste or mouthwash.   Do not wear jewelry, make-up, hairpins, clips or nail polish.  Do not wear lotions, powders, or perfumes. You may wear deodorant.  Do not shave 48 hours prior to surgery. Men may shave face and neck.  Do not bring valuables to the hospital.    [facility] is not responsible for any belongings or valuables.               Contacts, dentures or bridgework may not be worn into surgery.  Leave your suitcase in the car. After surgery it may be brought to your room.  For patients  admitted to the hospital, discharge time is determined by your  treatment team.  _  Patients discharged the day of surgery will not be allowed to drive home.  You will need someone to drive you home and stay with you the night of your procedure.    Please read over the following fact sheets that you were given:   [facility] Preparing for Surgery and or MRSA Information   _x___ TAKE THE FOLLOWING MEDICATION THE MORNING OF SURGERY. These include:  1. WELLBUTRIN  2. LAMICTAL  3. PROTONIX   4. TAKE AN EXTRA PROTONIX THE NIGHT BEFORE YOUR SURGERY (03-29-18)  5.  6.  ____Fleets enema or Magnesium Citrate as directed.   ____ Use CHG Soap or sage wipes as directed on instruction sheet   _X___ Use inhalers on the day of surgery and bring to hospital day of surgery-USE YOUR FLOVENT AND ALBUTEROL INHALER AT HOME AND BRING ALBUTEROL INHALER TO HOSPITAL  _X___ Stop Metformin and Janumet 2 days prior to surgery-LAST DOSE ON Saturday, MAY 4TH    ____ Take 1/2 of usual insulin dose the night before surgery and none on the morning     surgery.   ____ Follow recommendations from Cardiologist, Pulmonologist or PCP regarding stopping Aspirin, Coumadin, Plavix ,Eliquis, Effient, or  Pradaxa, and Pletal.  X____Stop Anti-inflammatories such as Advil, Aleve, Ibuprofen, Motrin, Naproxen, Naprosyn, Goodies powders or aspirin products. OK to take Tylenol     _x___ Stop supplements until after surgery-STOP PHENTERMINE NOW   ____ Bring C-Pap to the hospital.

## 2018-03-26 ENCOUNTER — Encounter
Admission: RE | Admit: 2018-03-26 | Discharge: 2018-03-26 | Disposition: A | Discharge status: Home / Self Care | Payer: Managed Care, Other (non HMO) | Source: Ambulatory Visit | Attending: Obstetrics and Gynecology | Admitting: Obstetrics and Gynecology

## 2018-03-26 DIAGNOSIS — E119 Type 2 diabetes mellitus without complications: Secondary | ICD-10-CM | POA: Diagnosis not present

## 2018-03-26 LAB — COMPREHENSIVE METABOLIC PANEL
ALT: 16 U/L (ref 14–54)
AST: 18 U/L (ref 15–41)
Albumin: 3.6 g/dL (ref 3.5–5.0)
Alkaline Phosphatase: 87 U/L (ref 38–126)
Anion gap: 7 (ref 5–15)
BUN: 11 mg/dL (ref 6–20)
CO2: 27 mmol/L (ref 22–32)
Calcium: 8.7 mg/dL — ABNORMAL LOW (ref 8.9–10.3)
Chloride: 104 mmol/L (ref 101–111)
Creatinine, Ser: 0.64 mg/dL (ref 0.44–1.00)
GFR calc Af Amer: >60 mL/min (ref >60)
GFR calc non Af Amer: >60 mL/min (ref >60)
Glucose, Bld: 109 mg/dL — ABNORMAL HIGH (ref 65–99)
Potassium: 3.9 mmol/L (ref 3.5–5.1)
Sodium: 138 mmol/L (ref 135–145)
Total Bilirubin: 0.7 mg/dL (ref 0.3–1.2)
Total Protein: 6.9 g/dL (ref 6.5–8.1)

## 2018-03-30 ENCOUNTER — Other Ambulatory Visit: Payer: Self-pay

## 2018-03-30 ENCOUNTER — Encounter
Admission: RE | Disposition: A | Discharge status: Home / Self Care | Payer: Self-pay | Source: Ambulatory Visit | Attending: Obstetrics and Gynecology

## 2018-03-30 ENCOUNTER — Ambulatory Visit: Payer: Managed Care, Other (non HMO) | Admitting: Anesthesiology

## 2018-03-30 ENCOUNTER — Observation Stay
Admission: RE | Admit: 2018-03-30 | Discharge: 2018-03-31 | Disposition: A | Discharge status: Home / Self Care | Payer: Managed Care, Other (non HMO) | Source: Ambulatory Visit | Attending: Obstetrics and Gynecology | Admitting: Obstetrics and Gynecology

## 2018-03-30 DIAGNOSIS — E119 Type 2 diabetes mellitus without complications: Secondary | ICD-10-CM | POA: Insufficient documentation

## 2018-03-30 DIAGNOSIS — I10 Essential (primary) hypertension: Secondary | ICD-10-CM | POA: Diagnosis not present

## 2018-03-30 DIAGNOSIS — N840 Polyp of corpus uteri: Principal | ICD-10-CM | POA: Insufficient documentation

## 2018-03-30 DIAGNOSIS — J45909 Unspecified asthma, uncomplicated: Secondary | ICD-10-CM | POA: Diagnosis not present

## 2018-03-30 DIAGNOSIS — F419 Anxiety disorder, unspecified: Secondary | ICD-10-CM | POA: Insufficient documentation

## 2018-03-30 DIAGNOSIS — R110 Nausea: Secondary | ICD-10-CM | POA: Insufficient documentation

## 2018-03-30 DIAGNOSIS — Z7984 Long term (current) use of oral hypoglycemic drugs: Secondary | ICD-10-CM | POA: Diagnosis not present

## 2018-03-30 DIAGNOSIS — Z7951 Long term (current) use of inhaled steroids: Secondary | ICD-10-CM | POA: Diagnosis not present

## 2018-03-30 DIAGNOSIS — Z79899 Other long term (current) drug therapy: Secondary | ICD-10-CM | POA: Diagnosis not present

## 2018-03-30 DIAGNOSIS — K219 Gastro-esophageal reflux disease without esophagitis: Secondary | ICD-10-CM | POA: Diagnosis not present

## 2018-03-30 DIAGNOSIS — Z9884 Bariatric surgery status: Secondary | ICD-10-CM | POA: Diagnosis not present

## 2018-03-30 DIAGNOSIS — F329 Major depressive disorder, single episode, unspecified: Secondary | ICD-10-CM | POA: Diagnosis not present

## 2018-03-30 DIAGNOSIS — Z9889 Other specified postprocedural states: Secondary | ICD-10-CM

## 2018-03-30 HISTORY — PX: DILATATION & CURETTAGE/HYSTEROSCOPY WITH MYOSURE: SHX6511

## 2018-03-30 LAB — CBC
HCT: 38.7 % (ref 35.0–47.0)
Hemoglobin: 12.3 g/dL (ref 12.0–16.0)
MCH: 23.3 pg — ABNORMAL LOW (ref 26.0–34.0)
MCHC: 31.8 g/dL — ABNORMAL LOW (ref 32.0–36.0)
MCV: 73.3 fL — ABNORMAL LOW (ref 80.0–100.0)
Platelets: 228 10*3/uL (ref 150–440)
RBC: 5.27 MIL/uL — ABNORMAL HIGH (ref 3.80–5.20)
RDW: 14.1 % (ref 11.5–14.5)
WBC: 6.9 10*3/uL (ref 3.6–11.0)

## 2018-03-30 LAB — TYPE AND SCREEN
ABO/RH(D): O POS
Antibody Screen: NEGATIVE

## 2018-03-30 LAB — GLUCOSE, CAPILLARY
Glucose-Capillary: 119 mg/dL — ABNORMAL HIGH (ref 65–99)
Glucose-Capillary: 85 mg/dL (ref 65–99)

## 2018-03-30 LAB — POCT PREGNANCY, URINE: Preg Test, Ur: NEGATIVE

## 2018-03-30 SURGERY — DILATATION & CURETTAGE/HYSTEROSCOPY WITH MYOSURE
Anesthesia: General | Wound class: Clean Contaminated

## 2018-03-30 MED ORDER — MORPHINE SULFATE (PF) 2 MG/ML IV SOLN: 1.0000 mg | INTRAVENOUS | Status: DC | PRN

## 2018-03-30 MED ORDER — HYDROXYZINE HCL 25 MG PO TABS
25.0000 mg | ORAL_TABLET | Freq: Every evening | ORAL | Status: DC | PRN
  Administered 2018-03-31: 25 mg via ORAL
  Filled 2018-03-30 (×2): qty 1

## 2018-03-30 MED ORDER — BUDESONIDE 0.25 MG/2ML IN SUSP
0.2500 mg | Freq: Two times a day (BID) | RESPIRATORY_TRACT | Status: DC
  Filled 2018-03-30 (×2): qty 2

## 2018-03-30 MED ORDER — SODIUM CHLORIDE FLUSH 0.9 % IV SOLN
INTRAVENOUS | Status: AC
  Filled 2018-03-30: qty 10

## 2018-03-30 MED ORDER — OXYCODONE HCL 5 MG/5ML PO SOLN: 5.0000 mg | Freq: Once | ORAL | Status: DC | PRN

## 2018-03-30 MED ORDER — POTASSIUM CHLORIDE 2 MEQ/ML IV SOLN
INTRAVENOUS | Status: DC
  Administered 2018-03-31: 01:00:00 via INTRAVENOUS
  Filled 2018-03-30 (×2): qty 1000

## 2018-03-30 MED ORDER — MIDAZOLAM HCL 2 MG/2ML IJ SOLN
INTRAMUSCULAR | Status: AC
  Filled 2018-03-30: qty 2

## 2018-03-30 MED ORDER — ONDANSETRON HCL 4 MG/2ML IJ SOLN
INTRAMUSCULAR | Status: AC
  Administered 2018-03-30: 4 mg
  Filled 2018-03-30: qty 2

## 2018-03-30 MED ORDER — MONTELUKAST SODIUM 10 MG PO TABS
10.0000 mg | ORAL_TABLET | Freq: Every day | ORAL | Status: DC
  Filled 2018-03-30: qty 1

## 2018-03-30 MED ORDER — OXYCODONE HCL 5 MG PO TABS: 5.0000 mg | ORAL_TABLET | Freq: Once | ORAL | Status: DC | PRN

## 2018-03-30 MED ORDER — FENTANYL CITRATE (PF) 100 MCG/2ML IJ SOLN
INTRAMUSCULAR | Status: AC
  Administered 2018-03-30: 25 ug via INTRAVENOUS
  Filled 2018-03-30: qty 2

## 2018-03-30 MED ORDER — FENTANYL CITRATE (PF) 100 MCG/2ML IJ SOLN
INTRAMUSCULAR | Status: AC
  Filled 2018-03-30: qty 2

## 2018-03-30 MED ORDER — ACETAMINOPHEN 325 MG PO TABS: 650.0000 mg | ORAL_TABLET | ORAL | Status: DC | PRN

## 2018-03-30 MED ORDER — LEVOCETIRIZINE DIHYDROCHLORIDE 5 MG PO TABS: 5.0000 mg | ORAL_TABLET | Freq: Every evening | ORAL | Status: DC

## 2018-03-30 MED ORDER — SIMETHICONE 80 MG PO CHEW: 80.0000 mg | CHEWABLE_TABLET | Freq: Four times a day (QID) | ORAL | Status: DC | PRN

## 2018-03-30 MED ORDER — BUPROPION HCL ER (SR) 100 MG PO TB12
100.0000 mg | ORAL_TABLET | Freq: Every day | ORAL | Status: DC
  Filled 2018-03-30: qty 1

## 2018-03-30 MED ORDER — ONDANSETRON HCL 4 MG PO TABS: 4.0000 mg | ORAL_TABLET | Freq: Four times a day (QID) | ORAL | Status: DC | PRN

## 2018-03-30 MED ORDER — LACTATED RINGERS IV SOLN
INTRAVENOUS | Status: DC
  Administered 2018-03-30: 20:00:00 via INTRAVENOUS

## 2018-03-30 MED ORDER — FENTANYL CITRATE (PF) 100 MCG/2ML IJ SOLN
INTRAMUSCULAR | Status: DC | PRN
  Administered 2018-03-30 (×4): 50 ug via INTRAVENOUS

## 2018-03-30 MED ORDER — MIDAZOLAM HCL 2 MG/2ML IJ SOLN
INTRAMUSCULAR | Status: DC | PRN
  Administered 2018-03-30 (×2): 1 mg via INTRAVENOUS

## 2018-03-30 MED ORDER — DOCUSATE SODIUM 100 MG PO CAPS: 100.0000 mg | ORAL_CAPSULE | Freq: Two times a day (BID) | ORAL | Status: DC

## 2018-03-30 MED ORDER — SUCCINYLCHOLINE CHLORIDE 20 MG/ML IJ SOLN
INTRAMUSCULAR | Status: DC | PRN
  Administered 2018-03-30: 100 mg via INTRAVENOUS

## 2018-03-30 MED ORDER — PROPOFOL 10 MG/ML IV BOLUS
INTRAVENOUS | Status: DC | PRN
  Administered 2018-03-30: 20 mg via INTRAVENOUS
  Administered 2018-03-30: 200 mg via INTRAVENOUS

## 2018-03-30 MED ORDER — FENTANYL CITRATE (PF) 100 MCG/2ML IJ SOLN
25.0000 ug | INTRAMUSCULAR | Status: DC | PRN
  Administered 2018-03-30 (×4): 25 ug via INTRAVENOUS
  Administered 2018-03-30: 50 ug via INTRAVENOUS

## 2018-03-30 MED ORDER — PANTOPRAZOLE SODIUM 40 MG PO TBEC
40.0000 mg | DELAYED_RELEASE_TABLET | ORAL | Status: DC
  Administered 2018-03-31: 40 mg via ORAL
  Filled 2018-03-30: qty 1

## 2018-03-30 MED ORDER — SODIUM CHLORIDE 0.9 % IV SOLN
INTRAVENOUS | Status: DC
  Administered 2018-03-30 (×2): via INTRAVENOUS

## 2018-03-30 MED ORDER — PROMETHAZINE HCL 25 MG/ML IJ SOLN: 12.5000 mg | Freq: Once | INTRAMUSCULAR | Status: DC

## 2018-03-30 MED ORDER — ONDANSETRON HCL 4 MG/2ML IJ SOLN: 4.0000 mg | Freq: Four times a day (QID) | INTRAMUSCULAR | Status: DC | PRN

## 2018-03-30 MED ORDER — PROPOFOL 10 MG/ML IV BOLUS
INTRAVENOUS | Status: AC
  Filled 2018-03-30: qty 20

## 2018-03-30 MED ORDER — OXYCODONE-ACETAMINOPHEN 5-325 MG PO TABS: 1.0000 | ORAL_TABLET | ORAL | Status: DC | PRN

## 2018-03-30 MED ORDER — LIDOCAINE HCL (CARDIAC) PF 100 MG/5ML IV SOSY
PREFILLED_SYRINGE | INTRAVENOUS | Status: DC | PRN
  Administered 2018-03-30: 100 mg via INTRAVENOUS

## 2018-03-30 MED ORDER — ALBUTEROL SULFATE (2.5 MG/3ML) 0.083% IN NEBU: 2.5000 mg | INHALATION_SOLUTION | Freq: Four times a day (QID) | RESPIRATORY_TRACT | Status: DC | PRN

## 2018-03-30 MED ORDER — LORATADINE 10 MG PO TABS: 10.0000 mg | ORAL_TABLET | Freq: Every evening | ORAL | Status: DC

## 2018-03-30 MED ORDER — LAMOTRIGINE 25 MG PO TABS
50.0000 mg | ORAL_TABLET | ORAL | Status: DC
  Administered 2018-03-31: 50 mg via ORAL
  Filled 2018-03-30: qty 2

## 2018-03-30 MED ORDER — PROMETHAZINE HCL 25 MG/ML IJ SOLN
INTRAMUSCULAR | Status: AC
  Administered 2018-03-30: 12.5 mg
  Filled 2018-03-30: qty 1

## 2018-03-30 SURGICAL SUPPLY — 18 items
BAG COUNTER SPONGE EZ (MISCELLANEOUS) IMPLANT
CANISTER SUC SOCK COL 7IN (MISCELLANEOUS) ×2 IMPLANT
CATH ROBINSON RED A/P 16FR (CATHETERS) ×2 IMPLANT
DEVICE MYOSURE LITE (MISCELLANEOUS) IMPLANT
DEVICE MYOSURE REACH (MISCELLANEOUS) IMPLANT
ELECT REM PT RETURN 9FT ADLT (ELECTROSURGICAL)
ELECTRODE REM PT RTRN 9FT ADLT (ELECTROSURGICAL) IMPLANT
GOWN STRL REUS W/ TWL LRG LVL3 (GOWN DISPOSABLE) ×2 IMPLANT
GOWN STRL REUS W/TWL LRG LVL3 (GOWN DISPOSABLE) ×2
PACK DNC HYST (MISCELLANEOUS) ×2 IMPLANT
PAD OB MATERNITY 4.3X12.25 (PERSONAL CARE ITEMS) ×2 IMPLANT
PAD PREP 24X41 OB/GYN DISP (PERSONAL CARE ITEMS) ×2 IMPLANT
SOL .9 NS 3000ML IRR  AL (IV SOLUTION) ×1
SOL .9 NS 3000ML IRR UROMATIC (IV SOLUTION) ×1 IMPLANT
STRAP SAFETY 5IN WIDE (MISCELLANEOUS) ×2 IMPLANT
TOWEL OR 17X26 4PK STRL BLUE (TOWEL DISPOSABLE) ×2 IMPLANT
TUBING CONNECTING 10 (TUBING) ×2 IMPLANT
TUBING HYSTEROSCOPY DOLPHIN (MISCELLANEOUS) IMPLANT

## 2018-03-30 NOTE — Discharge Instructions (Signed)
AMBULATORY SURGERY  DISCHARGE INSTRUCTIONS   1) The drugs that you were given will stay in your system until tomorrow so for the next 24 hours you should not:  A) Drive an automobile B) Make any legal decisions C) Drink any alcoholic beverage   2) You may resume regular meals tomorrow.  Today it is better to start with liquids and gradually work up to solid foods.  You may eat anything you prefer, but it is better to start with liquids, then soup and crackers, and gradually work up to solid foods.   3) Please notify your doctor immediately if you have any unusual bleeding, trouble breathing, redness and pain at the surgery site, drainage, fever, or pain not relieved by medication.    4) Additional Instructions:        Please contact your physician with any problems or Same Day Surgery at [phone], Monday through Friday 6 am to 4 pm, or  at [facility] number at [phone].Hysteroscopy, Care After Refer to this sheet in the next few weeks. These instructions provide you with information on caring for yourself after your procedure. Your health care provider may also give you more specific instructions. Your treatment has been planned according to current medical practices, but problems sometimes occur. Call your health care provider if you have any problems or questions after your procedure. What can I expect after the procedure? After your procedure, it is typical to have the following:  You may have some cramping. This normally lasts for a couple days.  You may have bleeding. This can vary from light spotting for a few days to menstrual-like bleeding for 3-7 days.  Follow these instructions at home:  Rest for the first 1-2 days after the procedure.  Only take over-the-counter or prescription medicines as directed by your health care provider. Do not take aspirin. It can increase the chances of bleeding.  Take showers instead of baths for 2 weeks or as  directed by your health care provider.  Do not drive for 24 hours or as directed.  Do not drink alcohol while taking pain medicine.  Do not use tampons, douche, or have sexual intercourse for 2 weeks or until your health care provider says it is okay.  Take your temperature twice a day for 4-5 days. Write it down each time.  Follow your health care provider's advice about diet, exercise, and lifting.  If you develop constipation, you may: ? Take a mild laxative if your health care provider approves. ? Add bran foods to your diet. ? Drink enough fluids to keep your urine clear or pale yellow.  Try to have someone with you or available to you for the first 24-48 hours, especially if you were given a general anesthetic.  Follow up with your health care provider as directed. Contact a health care provider if:  You feel dizzy or lightheaded.  You feel sick to your stomach (nauseous).  You have abnormal vaginal discharge.  You have a rash.  You have pain that is not controlled with medicine. Get help right away if:  You have bleeding that is heavier than a normal menstrual period.  You have a fever.  You have increasing cramps or pain, not controlled with medicine.  You have new belly (abdominal) pain.  You pass out.  You have pain in the tops of your shoulders (shoulder strap areas).  You have shortness of breath. This information is not intended to replace advice given to you by  your health care provider. Make sure you discuss any questions you have with your health care provider. Document Released: 08/31/2013 Document Revised: 04/17/2016 Document Reviewed: 06/09/2013 Elsevier Interactive Patient Education  2017 [employer].

## 2018-03-30 NOTE — Anesthesia Postprocedure Evaluation (Signed)
Anesthesia Post Note  Patient: Shirley [name]  Procedure(s) Performed: DILATATION & CURETTAGE/HYSTEROSCOPY (N/A )  Patient location during evaluation: PACU Anesthesia Type: General Level of consciousness: awake and alert Pain management: pain level controlled Vital Signs Assessment: post-procedure vital signs reviewed and stable Respiratory status: spontaneous breathing, nonlabored ventilation, respiratory function stable and patient connected to nasal cannula oxygen Cardiovascular status: blood pressure returned to baseline and stable Postop Assessment: no apparent nausea or vomiting Anesthetic complications: no     Last Vitals:  Vitals:   03/30/18 2135 03/30/18 2140  BP:  (!) 149/99  Pulse: 84 85  Resp: (!) 35 (!) 31  Temp:    SpO2: 99% 99%    Last Pain:  Vitals:   03/30/18 2140  TempSrc:   PainSc: 1                  Cleda Mccreedy Keira Bohlin

## 2018-03-30 NOTE — Op Note (Signed)
Operative Note  03/30/2018  PRE-OP DIAGNOSIS: Endometrial Polyp  POST-OP DIAGNOSIS: Thickened Endometrium  SURGEON: Christanna Schuman MD  PROCEDURE: Procedure(s): DILATATION & CURETTAGE/HYSTEROSCOPY   ANESTHESIA: General   ESTIMATED BLOOD LOSS: less than 10 mL   SPECIMENS:  Endometrial Curettings  FLUID DEFICIT: None  COMPLICATIONS: None  DISPOSITION: PACU - hemodynamically stable.  CONDITION: stable  FINDINGS: Exam under anesthesia revealed small, mobile 8cm uterus with no masses and bilateral adnexa without masses or fullness. Hysteroscopy revealed a  grossly normal appearing uterine cavity with bilateral tubal ostia and normal appearing endocervical canal.No endometrial polyp seen, but endometrial thickening present.   PROCEDURE IN DETAIL: After informed consent was obtained, the patient was taken to the operating room where anesthesia was obtained without difficulty. The patient was positioned in the dorsal lithotomy position in [place] stirrups. The patient's bladder was catheterized with an in and out foley catheter. The patient was examined under anesthesia, with the above noted findings. The weighted speculum was placed inside the patient's vagina, and the the anterior lip of the cervix was seen and grasped with the tenaculum. The uterine cavity was sounded to 8 cm, and then the cervix was progressively dilated to a 18 French-Pratt dilator. The 0 degree hysteroscope was introduced, with saline fluid used to distend the intrauterine cavity, with the above noted findings.  The hystersocope was removed and the uterine cavity was curetted until a gritty texture was noted, yielding endometrial curettings. The hysteroscopy was repeated. Both tubal osteo were visualized. The endometrial lining was uniform and thin. All instruments were removed from the vagina and the procedure was discontinued. Excellent hemostasis noted throughout the case. She was then taken out of dorsal lithotomy and  awakened from anesthesia. Minimal discrepancy in fluid was noted.  The patient tolerated the procedure well. Sponge, lap and needle counts were correct x2. The patient was taken to recovery room in excellent condition.  Natale Milch MD [facility], [facility] 03/30/2018  8:10 PM

## 2018-03-30 NOTE — Transfer of Care (Signed)
Immediate Anesthesia Transfer of Care Note  Patient: Shirley [name]  Procedure(s) Performed: DILATATION & CURETTAGE/HYSTEROSCOPY (N/A )  Patient Location: PACU  Anesthesia Type:General  Level of Consciousness: oriented, sedated and patient cooperative  Airway & Oxygen Therapy: Patient Spontanous Breathing and Patient connected to face mask oxygen  Post-op Assessment: Report given to RN and Post -op Vital signs reviewed and stable  Post vital signs: Reviewed and stable  Last Vitals:  Vitals Value Taken Time  BP 148/94 03/30/2018  8:07 PM  Temp    Pulse 95 03/30/2018  8:07 PM  Resp 18 03/30/2018  8:07 PM  SpO2 100 % 03/30/2018  8:07 PM  Vitals shown include unvalidated device data.  Last Pain:  Vitals:   03/30/18 1316  TempSrc: Temporal  PainSc: 0-No pain         Complications: No apparent anesthesia complications

## 2018-03-30 NOTE — Anesthesia Preprocedure Evaluation (Signed)
Anesthesia Evaluation  Patient identified by MRN, date of birth, ID band Patient awake    Reviewed: Allergy & Precautions, H&P , NPO status , Patient's Chart, lab work & pertinent test results  History of Anesthesia Complications (+) PONV and history of anesthetic complications  Airway Mallampati: III  TM Distance: >3 FB Neck ROM: full    Dental  (+) Chipped, Poor Dentition   Pulmonary asthma ,           Cardiovascular Exercise Tolerance: Good hypertension, (-) angina(-) DOE + dysrhythmias      Neuro/Psych  Headaches, PSYCHIATRIC DISORDERS Anxiety Depression    GI/Hepatic Neg liver ROS, hiatal hernia, GERD  Medicated and Controlled,  Endo/Other  diabetes, Type 2  Renal/GU      Musculoskeletal   Abdominal   Peds  Hematology negative hematology ROS (+)   Anesthesia Other Findings Past Medical History: No date: Anemia No date: Asthma     Comment:  WELL CONTROLLED No date: Complication of anesthesia     Comment:  -TACYCARDIA AFTER GASTRIC SURGERY IN 2016 AND HAD TO               STAY 5 DAYS-itching after surgery 2005, 2006 No date: Depression No date: Diabetes mellitus (HCC) No date: Fatty liver No date: GERD (gastroesophageal reflux disease) No date: Heart murmur No date: History of dermoid cyst excision No date: History of hiatal hernia No date: Hypertension     Comment:  H/O No date: IBS (irritable bowel syndrome) No date: Migraine No date: PONV (postoperative nausea and vomiting)     Comment:  nausea  Past Surgical History: 2005: CHOLECYSTECTOMY 08/28/2015: LAPAROSCOPIC GASTRIC RESTRICTIVE DUODENAL PROCEDURE  (DUODENAL SWITCH); N/A     Comment:  Procedure: LAPAROSCOPIC GASTRIC RESTRICTIVE DUODENAL               PROCEDURE (DUODENAL SWITCH);  Surgeon: [name],               MD;  Location: [facility] ORS;  Service: General;  Laterality:               N/A; 2006: TUMOR REMOVAL     Comment:  dermoid  tumor  BMI    Body Mass Index:  45.35 kg/m      Reproductive/Obstetrics negative OB ROS                             Anesthesia Physical Anesthesia Plan  ASA: III  Anesthesia Plan: General ETT   Post-op Pain Management:    Induction: Intravenous  PONV Risk Score and Plan: Ondansetron, Dexamethasone and Midazolam  Airway Management Planned: Oral ETT  Additional Equipment:   Intra-op Plan:   Post-operative Plan: Extubation in OR  Informed Consent: I have reviewed the patients History and Physical, chart, labs and discussed the procedure including the risks, benefits and alternatives for the proposed anesthesia with the patient or authorized representative who has indicated his/her understanding and acceptance.   Dental Advisory Given  Plan Discussed with: Anesthesiologist, CRNA and Surgeon  Anesthesia Plan Comments: (Patient consented for risks of anesthesia including but not limited to:  - adverse reactions to medications - damage to teeth, lips or other oral mucosa - sore throat or hoarseness - Damage to heart, brain, lungs or loss of life  Patient voiced understanding.)        Anesthesia Quick Evaluation

## 2018-03-30 NOTE — Anesthesia Procedure Notes (Signed)
Procedure Name: Intubation Date/Time: 03/30/2018 7:13 PM Performed by: Waldo Laine, CRNA Pre-anesthesia Checklist: Patient identified, Patient being monitored, Timeout performed, Emergency Drugs available and Suction available Patient Re-evaluated:Patient Re-evaluated prior to induction Oxygen Delivery Method: Circle system utilized Preoxygenation: Pre-oxygenation with 100% oxygen Induction Type: IV induction Ventilation: Mask ventilation without difficulty Laryngoscope Size: Miller and 2 Grade View: Grade I Tube type: Oral Tube size: 7.0 mm Number of attempts: 1 Airway Equipment and Method: Stylet Placement Confirmation: ETT inserted through vocal cords under direct vision,  positive ETCO2 and breath sounds checked- equal and bilateral Secured at: 21 cm Tube secured with: Tape Dental Injury: Teeth and Oropharynx as per pre-operative assessment

## 2018-03-30 NOTE — Anesthesia Post-op Follow-up Note (Signed)
Anesthesia QCDR form completed.        

## 2018-03-30 NOTE — Interval H&P Note (Signed)
History and Physical Interval Note:  03/30/2018 5:16 PM  Shirley [name]  has presented today for surgery, with the diagnosis of endometrial polyp  The various methods of treatment have been discussed with the patient and family. After consideration of risks, benefits and other options for treatment, the patient has consented to  Procedure(s): DILATATION & CURETTAGE/HYSTEROSCOPY,POLYPECTOMY (N/A) as a surgical intervention .  The patient's history has been reviewed, patient examined, no change in status, stable for surgery.  I have reviewed the patient's chart and labs.  Questions were answered to the patient's satisfaction.     [name]

## 2018-03-31 ENCOUNTER — Encounter: Payer: Self-pay | Admitting: Obstetrics and Gynecology

## 2018-03-31 DIAGNOSIS — N840 Polyp of corpus uteri: Secondary | ICD-10-CM | POA: Diagnosis not present

## 2018-03-31 NOTE — Progress Notes (Signed)
Provided and reviewed discharge paperwork. Pt and her sister at bedside verbalized understanding, teach back method used. Pt discharged in stable condition, via wheelchair by volunteer with sister to drive her home. Denies pain, VS stable. Discharged per MD order.

## 2018-03-31 NOTE — Discharge Summary (Signed)
. Physician Discharge Summary   Patient ID: Shirley [name] [id number] 37 y.o. 17-Oct-1981  Admit date: 03/30/2018  Discharge date and time: No discharge date for patient encounter.   Admitting Physician: Natale Milch, MD   Discharge Physician: Adelene Idler MD  Admission Diagnoses: endometrial polyp  Discharge Diagnoses: s/p hysteroscopy  Admission Condition: good  Discharged Condition: good  Indication for Admission: Nausea postoperatively  Hospital Course: Patient was admitted overnight for nausea and pain management. Patient tolerated food and felt much better by morning time. She requested discharge home.3  Consults: None  Significant Diagnostic Studies: None  Treatments: IV hydration  Discharge Exam: BP (!) 105/57 (BP Location: Right Arm)   Pulse 67   Temp 98.5 F (36.9 C) (Oral)   Resp 18   Ht  (1.549 m)   Wt 240 lb (108.9 kg)   LMP 01/25/2018   SpO2 97%   BMI 45.35 kg/m   General Appearance:    Alert, cooperative, no distress, appears stated age  Head:    Normocephalic, without obvious abnormality, atraumatic  Eyes:    PERRL, conjunctiva/corneas clear, EOM's intact, fundi    benign, both eyes  Ears:    Normal TM's and external ear canals, both ears  Nose:   Nares normal, septum midline, mucosa normal, no drainage    or sinus tenderness  Throat:   Lips, mucosa, and tongue normal; teeth and gums normal  Neck:   Supple, symmetrical, trachea midline, no adenopathy;    thyroid:  no enlargement/tenderness/nodules; no carotid   bruit or JVD  Back:     Symmetric, no curvature, ROM normal, no CVA tenderness  Lungs:     Clear to auscultation bilaterally, respirations unlabored  Chest Wall:    No tenderness or deformity   Heart:    Regular rate and rhythm, S1 and S2 normal, no murmur, rub   or gallop  Breast Exam:    No tenderness, masses, or nipple abnormality  Abdomen:     Soft, non-tender, bowel sounds active all four quadrants,    no  masses, no organomegaly  Genitalia:    Normal female without lesion, discharge or tenderness  Rectal:    Normal tone, normal prostate, no masses or tenderness;   guaiac negative stool  Extremities:   Extremities normal, atraumatic, no cyanosis or edema  Pulses:   2+ and symmetric all extremities  Skin:   Skin color, texture, turgor normal, no rashes or lesions  Lymph nodes:   Cervical, supraclavicular, and axillary nodes normal  Neurologic:   CNII-XII intact, normal strength, sensation and reflexes    throughout    Disposition:   Patient Instructions:  Allergies as of 03/31/2018      Reactions   Ibuprofen Other (See Comments)   Upset stomach   Caffeine Other (See Comments)   Stomach issues      Medication List    TAKE these medications   acetaminophen 500 MG tablet Commonly known as:  TYLENOL Take 1,000 mg by mouth every 6 (six) hours as needed (for pain.).   albuterol 108 (90 Base) MCG/ACT inhaler Commonly known as:  PROVENTIL HFA;VENTOLIN HFA Inhale 2 puffs into the lungs every 6 (six) hours as needed for wheezing or shortness of breath. Reported on 05/01/2016   azelastine 0.05 % ophthalmic solution Commonly known as:  OPTIVAR Place 1 drop into both eyes 2 (two) times daily as needed (for allergic eye). Reported on 05/01/2016   buPROPion 100 MG 12 hr tablet Commonly  known as:  WELLBUTRIN SR Take 1 tablet (100 mg total) by mouth daily. What changed:  when to take this   CALCIUM CITRATE-VITAMIN D3 PO Take 1 tablet by mouth 3 (three) times a week.   clindamycin 1 % external solution Commonly known as:  CLEOCIN T Apply 1 application topically 2 (two) times daily as needed (applied to breast, groin, and underarms as needed.).   clindamycin 300 MG capsule Commonly known as:  CLEOCIN Take 300 mg by mouth See admin instructions. Take 1 capsule (300 mg) daily for 2 weeks as needed HS   FLOVENT HFA 110 MCG/ACT inhaler Generic drug:  fluticasone Inhale 2 puffs into the lungs  every morning.   Fluocinolone Acetonide Body 0.01 % Oil Apply 1 application topically as needed. Applied daily to scalp   hydrOXYzine 25 MG tablet Commonly known as:  ATARAX/VISTARIL Take 25 mg by mouth at bedtime as needed (for itching caused by hidradenitis suppurativa (HS)).   ketoconazole 2 % shampoo Commonly known as:  NIZORAL Apply 1 application topically 3 (three) times a week. Use 2-3 times a week. Leave in for 5 minutes before rinsing out   lamoTRIgine 25 MG tablet Commonly known as:  LAMICTAL Take 2 tablets (50 mg total) by mouth daily. What changed:  when to take this   levocetirizine 5 MG tablet Commonly known as:  XYZAL Take 1 tablet (5 mg total) by mouth every evening. Reported on 05/01/2016   metFORMIN 500 MG 24 hr tablet Commonly known as:  GLUCOPHAGE-XR Take 1 tablet (500 mg total) by mouth 2 (two) times daily with a meal.   montelukast 10 MG tablet Commonly known as:  SINGULAIR Take 10 mg by mouth at bedtime.   pantoprazole 40 MG tablet Commonly known as:  PROTONIX Take 1 tablet (40 mg total) by mouth daily. What changed:  when to take this   PATANASE 0.6 % Soln Generic drug:  Olopatadine HCl Place 1 spray into the nose daily as needed (for allergies.).   phentermine 37.5 MG tablet Commonly known as:  ADIPEX-P Take 37.5 mg by mouth daily.   PRECISION QID TEST test strip Generic drug:  glucose blood Use 1 strip  twice a day  [dx 250.02]   rifampin 300 MG capsule Commonly known as:  RIFADIN Take 300 mg by mouth See admin instructions. Take 1 capsule (300 mg) by mouth daily for 2 weeks for HS   traZODone 150 MG tablet Commonly known as:  DESYREL Take 1 tablet (150 mg total) by mouth at bedtime.   triamcinolone ointment 0.1 % Commonly known as:  KENALOG Apply 1 application topically 2 (two) times daily as needed (applied to affected areas of scalp).      Activity: activity as tolerated Diet: regular diet Wound Care: none needed  Follow-up  with Dr. Jerene Pitch  in 1 week.  Signed: Natale Milch 03/31/2018 9:12 AM

## 2018-03-31 NOTE — Progress Notes (Signed)
Patient ID: Shirley [name], female   DOB: [birth year], 37 y.o.   MRN: [id number]  Subjective  Patient is feeling well. Has been able to ambulate. Normal voiding. Minimal vaginal bleeding. Denies pain. Tolerating diet. Nausea improved.    Objective   Examination:  General exam: Appears calm and comfortable  Respiratory system: Clear to auscultation. Respiratory effort normal. HEENT: [place]/AT, PERRLA, no thrush, no stridor. Cardiovascular system: S1 & S2 heard, RRR. No JVD, murmurs, rubs, gallops or clicks. No pedal edema. Gastrointestinal system: Abdomen is nondistended, soft and nontender. No organomegaly or masses felt. Normal bowel sounds heard. Central nervous system: Alert and oriented. No focal neurological deficits. Extremities: Symmetric 5 x 5 power. Skin: No rashes, lesions or ulcers Psychiatry: Judgement and insight appear normal. Mood & affect appropriate.   VITALS:  height is  (1.549 m) and weight is 240 lb (108.9 kg). Her oral temperature is 98.5 F (36.9 C). Her blood pressure is 105/57 (abnormal) and her pulse is 67. Her respiration is 18 and oxygen saturation is 97%.   I personally reviewed Labs under Results section.  Radiology Reports No results found.     Assessment/Plan:  Will discharge home. Will follow up in office with patient. Discussed IVF with patient, she is interested in a referral to REI. Will complete this outpatient.   Code Status: FUll  Family Communication: Sister in room  Disposition Plan: Home   Time spent: 10 minutes   LOS: 0 days   Manasseh Pittsley R Austina Constantin

## 2018-04-01 ENCOUNTER — Encounter: Payer: Self-pay | Admitting: Obstetrics and Gynecology

## 2018-04-01 LAB — SURGICAL PATHOLOGY

## 2018-04-05 ENCOUNTER — Ambulatory Visit: Payer: 59 | Admitting: Psychiatry

## 2018-04-05 NOTE — Progress Notes (Signed)
Released to mychart

## 2018-04-06 ENCOUNTER — Ambulatory Visit: Payer: Managed Care, Other (non HMO) | Admitting: Internal Medicine

## 2018-04-06 ENCOUNTER — Encounter: Payer: Self-pay | Admitting: Internal Medicine

## 2018-04-06 DIAGNOSIS — E559 Vitamin D deficiency, unspecified: Secondary | ICD-10-CM

## 2018-04-06 DIAGNOSIS — Z9889 Other specified postprocedural states: Secondary | ICD-10-CM | POA: Diagnosis not present

## 2018-04-06 DIAGNOSIS — Z9109 Other allergy status, other than to drugs and biological substances: Secondary | ICD-10-CM

## 2018-04-06 DIAGNOSIS — E119 Type 2 diabetes mellitus without complications: Secondary | ICD-10-CM

## 2018-04-06 DIAGNOSIS — K219 Gastro-esophageal reflux disease without esophagitis: Secondary | ICD-10-CM | POA: Diagnosis not present

## 2018-04-06 DIAGNOSIS — I10 Essential (primary) hypertension: Secondary | ICD-10-CM

## 2018-04-06 DIAGNOSIS — F331 Major depressive disorder, recurrent, moderate: Secondary | ICD-10-CM | POA: Diagnosis not present

## 2018-04-06 MED ORDER — PANTOPRAZOLE SODIUM 40 MG PO TBEC: 40.0000 mg | DELAYED_RELEASE_TABLET | ORAL | 5 refills | Status: DC

## 2018-04-06 NOTE — Progress Notes (Signed)
Patient ID: Shirley [name], female   DOB: 10-15-1981, 37 y.o.   MRN: [id number]   Subjective:    Patient ID: Shirley [name], female    DOB: 1981/02/27, 37 y.o.   MRN: [id number]  HPI  Patient here for a scheduled follow up.  Is s/p recent D&C.  Doing relatively well.  Felt a little sluggish yesterday.  Feels better today.  Had two bowel movements Sunday.  No abdominal pain.  Eating.  protonix controlling acid reflux.  Will continue.  No chest pain.  No sob.  States am sugars averaging 110-120 and pm sugars 180.  Discussed diet and exercise.  Was started on phentermine.  Blood pressure doing well.     Past Medical History:  Diagnosis Date  . Anemia   . Asthma    WELL CONTROLLED  . Complication of anesthesia    -TACYCARDIA AFTER GASTRIC SURGERY IN 2016 AND HAD TO STAY 5 DAYS-itching after surgery 2005, 2006  . Depression   . Diabetes mellitus (HCC)   . Fatty liver   . GERD (gastroesophageal reflux disease)   . Heart murmur   . History of dermoid cyst excision   . History of hiatal hernia   . Hypertension    H/O  . IBS (irritable bowel syndrome)   . Migraine   . PONV (postoperative nausea and vomiting)    nausea   Past Surgical History:  Procedure Laterality Date  . CHOLECYSTECTOMY  2005  . DILATATION & CURETTAGE/HYSTEROSCOPY WITH MYOSURE N/A 03/30/2018   Procedure: DILATATION & CURETTAGE/HYSTEROSCOPY;  Surgeon: Natale Milch, MD;  Location: [facility] ORS;  Service: Gynecology;  Laterality: N/A;  . LAPAROSCOPIC GASTRIC RESTRICTIVE DUODENAL PROCEDURE (DUODENAL SWITCH) N/A 08/28/2015   Procedure: LAPAROSCOPIC GASTRIC RESTRICTIVE DUODENAL PROCEDURE (DUODENAL SWITCH);  Surgeon: [name], MD;  Location: [facility] ORS;  Service: General;  Laterality: N/A;  . 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 Hypertension Mother   . Diabetes Mother   . Asthma Brother   . Breast cancer Neg Hx   . Colon cancer Neg Hx    Social History    Socioeconomic History  . Marital status: Single    Spouse name: Not on file  . Number of children: 0  . Years of education: Not on file  . Highest education level: Not on file  Occupational History    Employer: [employer]  Social Needs  . Financial resource strain: Not on file  . Food insecurity:    Worry: Not on file    Inability: Not on file  . Transportation needs:    Medical: Not on file    Non-medical: Not on file  Tobacco Use  . Smoking status: Never Smoker  . Smokeless tobacco: Never Used  Substance and Sexual Activity  . Alcohol use: Yes    Alcohol/week: 0.0 oz    Comment: social  . Drug use: No  . Sexual activity: Not Currently    Birth control/protection: None  Lifestyle  . Physical activity:    Days per week: 1 day    Minutes per session: 30 min  . Stress: Very much  Relationships  . Social connections:    Talks on phone: Not on file    Gets together: Not on file    Attends religious service: Not on file    Active member of club or organization: Not on file    Attends meetings of clubs or organizations:  Not on file    Relationship status: Not on file  Other Topics Concern  . Not on file  Social History Narrative  . Not on file    Outpatient Encounter Medications as of 04/06/2018  Medication Sig  . acetaminophen (TYLENOL) 500 MG tablet Take 1,000 mg by mouth every 6 (six) hours as needed (for pain.).  Marland Kitchen albuterol (PROVENTIL HFA;VENTOLIN HFA) 108 (90 Base) MCG/ACT inhaler Inhale 2 puffs into the lungs every 6 (six) hours as needed for wheezing or shortness of breath. Reported on 05/01/2016  . azelastine (OPTIVAR) 0.05 % ophthalmic solution Place 1 drop into both eyes 2 (two) times daily as needed (for allergic eye). Reported on 05/01/2016  . buPROPion (WELLBUTRIN SR) 100 MG 12 hr tablet Take 1 tablet (100 mg total) by mouth daily. (Patient taking differently: Take 100 mg by mouth every morning. )  . CALCIUM CITRATE-VITAMIN D3 PO Take 1 tablet by mouth 3 (three)  times a week.  . clindamycin (CLEOCIN T) 1 % external solution Apply 1 application topically 2 (two) times daily as needed (applied to breast, groin, and underarms as needed.).  Marland Kitchen clindamycin (CLEOCIN) 300 MG capsule Take 300 mg by mouth See admin instructions. Take 1 capsule (300 mg) daily for 2 weeks as needed HS  . Fluocinolone Acetonide Body 0.01 % OIL Apply 1 application topically as needed. Applied daily to scalp  . fluticasone (FLOVENT HFA) 110 MCG/ACT inhaler Inhale 2 puffs into the lungs every morning.   Marland Kitchen glucose blood (PRECISION QID TEST) test strip Use 1 strip  twice a day  [dx 250.02]  . hydrOXYzine (ATARAX/VISTARIL) 25 MG tablet Take 25 mg by mouth at bedtime as needed (for itching caused by hidradenitis suppurativa (HS)).   Marland Kitchen ketoconazole (NIZORAL) 2 % shampoo Apply 1 application topically 3 (three) times a week. Use 2-3 times a week. Leave in for 5 minutes before rinsing out  . lamoTRIgine (LAMICTAL) 25 MG tablet Take 2 tablets (50 mg total) by mouth daily. (Patient taking differently: Take 50 mg by mouth every morning. )  . levocetirizine (XYZAL) 5 MG tablet Take 1 tablet (5 mg total) by mouth every evening. Reported on 05/01/2016  . metFORMIN (GLUCOPHAGE-XR) 500 MG 24 hr tablet Take 1 tablet (500 mg total) by mouth 2 (two) times daily with a meal.  . montelukast (SINGULAIR) 10 MG tablet Take 10 mg by mouth at bedtime.  . Olopatadine HCl (PATANASE) 0.6 % SOLN Place 1 spray into the nose daily as needed (for allergies.).   Marland Kitchen pantoprazole (PROTONIX) 40 MG tablet Take 1 tablet (40 mg total) by mouth every morning.  . phentermine (ADIPEX-P) 37.5 MG tablet Take 37.5 mg by mouth daily.  . rifampin (RIFADIN) 300 MG capsule Take 300 mg by mouth See admin instructions. Take 1 capsule (300 mg) by mouth daily for 2 weeks for HS  . traZODone (DESYREL) 150 MG tablet Take 1 tablet (150 mg total) by mouth at bedtime.  . triamcinolone ointment (KENALOG) 0.1 % Apply 1 application topically 2 (two)  times daily as needed (applied to affected areas of scalp).  . [DISCONTINUED] pantoprazole (PROTONIX) 40 MG tablet Take 1 tablet (40 mg total) by mouth daily. (Patient taking differently: Take 40 mg by mouth every morning. )   No facility-administered encounter medications on file as of 04/06/2018.     Review of Systems  Constitutional: Negative for appetite change.       Some fatigue.  Feels better today.    HENT: Negative  for congestion and sinus pressure.   Respiratory: Negative for cough, chest tightness and shortness of breath.   Cardiovascular: Negative for chest pain, palpitations and leg swelling.  Gastrointestinal: Negative for abdominal pain, diarrhea, nausea and vomiting.  Genitourinary: Negative for difficulty urinating and dysuria.  Musculoskeletal: Negative for joint swelling and myalgias.  Skin: Negative for color change and rash.  Neurological: Negative for dizziness, light-headedness and headaches.  Psychiatric/Behavioral: Negative for agitation and dysphoric mood.       Objective:     Blood pressure rechecked by me:  118/76  Physical Exam  Constitutional: She appears well-developed and well-nourished. No distress.  HENT:  Nose: Nose normal.  Mouth/Throat: Oropharynx is clear and moist.  Neck: Neck supple. No thyromegaly present.  Cardiovascular: Normal rate and regular rhythm.  Pulmonary/Chest: Breath sounds normal. No respiratory distress. She has no wheezes.  Abdominal: Soft. Bowel sounds are normal. There is no tenderness.  Musculoskeletal: She exhibits no edema or tenderness.  Lymphadenopathy:    She has no cervical adenopathy.  Skin: No rash noted. No erythema.  Psychiatric: She has a normal mood and affect. Her behavior is normal.    BP 118/76   Pulse 72   Temp 98.6 F (37 C) (Oral)   Resp 18   Ht  (1.549 m)   Wt 238 lb 8 oz (108.2 kg)   SpO2 98%   BMI 45.06 kg/m  Wt Readings from Last 3 Encounters:  04/07/18 236 lb (107 kg)  04/06/18  238 lb 8 oz (108.2 kg)  03/30/18 240 lb (108.9 kg)     Lab Results  Component Value Date   WBC 6.9 03/30/2018   HGB 12.3 03/30/2018   HCT 38.7 03/30/2018   PLT 228 03/30/2018   GLUCOSE 109 (H) 03/26/2018   CHOL 148 03/16/2017   TRIG 59 03/16/2017   HDL 59 03/16/2017   LDLCALC 77 03/16/2017   ALT 16 03/26/2018   AST 18 03/26/2018   NA 138 03/26/2018   K 3.9 03/26/2018   CL 104 03/26/2018   CREATININE 0.64 03/26/2018   BUN 11 03/26/2018   CO2 27 03/26/2018   TSH 1.120 08/10/2015   HGBA1C 7.1 01/07/2018       Assessment & Plan:   Problem List Items Addressed This Visit    Diabetes mellitus (HCC)    Sugars as outlined.  Discussed diet and exercise.  Back on metformin.  Has f/u planned with endocrinology.        Relevant Orders   Hemoglobin A1c   Hepatic function panel   Lipid panel   Basic metabolic panel   Microalbumin / creatinine urine ratio   Environmental allergies    Followed by Dr Cuba Callas.  Stable on current regimen.        GERD (gastroesophageal reflux disease)    Continue on protonix.        Relevant Medications   pantoprazole (PROTONIX) 40 MG tablet   Hypertension    Blood pressure on recheck improved.  Follow pressures.  Off medication.        Relevant Orders   TSH   Major depressive disorder, recurrent episode, moderate (HCC)    Followed by psychiatry.  Stable.        Severe obesity (BMI >= 40) (HCC)    Discussed diet and exercise.  Follow.        Status post hysteroscopy    S/p recent D&C.  Has f/u tomorrow.  Appears to be doing well.  Vitamin D deficiency    Follow vitamin D level.        Relevant Orders   VITAMIN D 25 Hydroxy (Vit-D Deficiency, Fractures)       Dale Melville, MD

## 2018-04-06 NOTE — Patient Instructions (Signed)
Get [facility] labs drawn a few days before appt.

## 2018-04-06 NOTE — Progress Notes (Signed)
Pre-visit discussion using our clinic review tool. No additional management support is needed unless otherwise documented below in the visit note.  

## 2018-04-07 ENCOUNTER — Ambulatory Visit: Payer: Self-pay | Admitting: Internal Medicine

## 2018-04-07 ENCOUNTER — Ambulatory Visit (INDEPENDENT_AMBULATORY_CARE_PROVIDER_SITE_OTHER): Payer: Managed Care, Other (non HMO) | Admitting: Obstetrics and Gynecology

## 2018-04-07 ENCOUNTER — Encounter: Payer: Self-pay | Admitting: Obstetrics and Gynecology

## 2018-04-07 VITALS — BP 130/84 | HR 89 | Ht 61.0 in | Wt 236.0 lb

## 2018-04-07 DIAGNOSIS — K5903 Drug induced constipation: Secondary | ICD-10-CM

## 2018-04-07 DIAGNOSIS — Z319 Encounter for procreative management, unspecified: Secondary | ICD-10-CM

## 2018-04-07 DIAGNOSIS — Z9889 Other specified postprocedural states: Secondary | ICD-10-CM

## 2018-04-07 MED ORDER — POLYETHYLENE GLYCOL 3350 17 G PO PACK: 17.0000 g | PACK | Freq: Every day | ORAL | Status: AC

## 2018-04-07 MED ORDER — BISACODYL 10 MG RE SUPP: 10.0000 mg | RECTAL | 0 refills | Status: DC | PRN

## 2018-04-07 NOTE — Progress Notes (Signed)
  Postoperative Follow-up Patient presents post op from operative hysteroscopy for uterine polyp, 1 week ago.  Subjective: Patient reports marked improvement in her preop symptoms. Eating a regular diet without difficulty. The patient is not having any pain.  Activity: normal activities of daily living. Patient reports vaginal sx's of None. Patient has not has a bowel movement since Sunday. She does not drink much water during the day.   Objective: BP 130/84   Pulse 89   Ht  (1.549 m)   Wt 236 lb (107 kg)   LMP 04/07/2018 (Exact Date)   BMI 44.59 kg/m  OBGyn Exam  Assessment: s/p :  operative hysteroscopy stable  Plan: Patient has done well after surgery with no apparent complications.  I have discussed the post-operative course to date, and the expected progress moving forward.  The patient understands what complications to be concerned about.  I will see the patient in routine follow up, or sooner if needed.    Patient given FAQ from [employer] about infertility. Discussed referral to [facility] fro IUI or IVF.   Given miralax and dulcolax enema for constipation   Activity plan: No restriction.  Christanna R Schuman 04/07/2018, 3:54 PM

## 2018-04-08 ENCOUNTER — Telehealth: Payer: Self-pay

## 2018-04-08 NOTE — Telephone Encounter (Signed)
Additional information filled out for [employer], signature obtained, and given to [place] for processing.

## 2018-04-14 ENCOUNTER — Encounter: Payer: Self-pay | Admitting: Internal Medicine

## 2018-04-14 NOTE — Assessment & Plan Note (Signed)
Followed by psychiatry.  Stable.  

## 2018-04-14 NOTE — Assessment & Plan Note (Signed)
Blood pressure on recheck improved.  Follow pressures.  Off medication.

## 2018-04-14 NOTE — Assessment & Plan Note (Signed)
Sugars as outlined.  Discussed diet and exercise.  Back on metformin.  Has f/u planned with endocrinology.

## 2018-04-14 NOTE — Assessment & Plan Note (Signed)
Continue on protonix.   

## 2018-04-14 NOTE — Assessment & Plan Note (Signed)
Follow vitamin D level.  

## 2018-04-14 NOTE — Assessment & Plan Note (Signed)
S/p recent D&C.  Has f/u tomorrow.  Appears to be doing well.

## 2018-04-14 NOTE — Assessment & Plan Note (Signed)
Discussed diet and exercise.  Follow.  

## 2018-04-14 NOTE — Assessment & Plan Note (Signed)
Followed by Dr Sharma.  Stable on current regimen.   

## 2018-05-03 ENCOUNTER — Other Ambulatory Visit: Payer: Self-pay

## 2018-05-03 ENCOUNTER — Encounter: Payer: Self-pay | Admitting: Psychiatry

## 2018-05-03 ENCOUNTER — Ambulatory Visit: Payer: 59 | Admitting: Psychiatry

## 2018-05-03 VITALS — BP 156/90 | HR 96 | Temp 98.6°F | Wt 240.8 lb

## 2018-05-03 DIAGNOSIS — F411 Generalized anxiety disorder: Secondary | ICD-10-CM | POA: Diagnosis not present

## 2018-05-03 DIAGNOSIS — F331 Major depressive disorder, recurrent, moderate: Secondary | ICD-10-CM | POA: Diagnosis not present

## 2018-05-03 MED ORDER — PHENTERMINE HCL 37.5 MG PO TABS: 37.5000 mg | ORAL_TABLET | Freq: Every day | ORAL | 1 refills | Status: DC

## 2018-05-03 MED ORDER — VENLAFAXINE HCL ER 75 MG PO CP24: 75.0000 mg | ORAL_CAPSULE | Freq: Every day | ORAL | 1 refills | Status: DC

## 2018-05-03 MED ORDER — LAMOTRIGINE 25 MG PO TABS: 50.0000 mg | ORAL_TABLET | Freq: Every day | ORAL | 1 refills | Status: DC

## 2018-05-03 MED ORDER — TRAZODONE HCL 150 MG PO TABS: 150.0000 mg | ORAL_TABLET | Freq: Every day | ORAL | 1 refills | Status: DC

## 2018-05-03 NOTE — Progress Notes (Signed)
Psychiatric MD Progress Note  Patient Identification: Shirley [name] MRN:  [id number] Date of Evaluation:  05/03/2018 Referral Source: Corrie Dandy- Therapist Chief Complaint:   Chief Complaint    Follow-up; Medication Refill     Visit Diagnosis:    ICD-10-CM   1. MDD (major depressive disorder), recurrent episode, moderate (HCC) F33.1   2. Generalized anxiety disorder F41.1     History of Present Illness:   Patient is a 37 year old female who presented for follow up.   Patient reported that she recently had a dilatation and curettage done due to endometrial polyps.  Patient reported that she was started on Adipex by her surgeon who will help her for weight loss but she stopped taking the medication before her surgery.  She is willing to start the medication again.  She reported that she was feeling agitated towards the end of April and May when she was taking the medication.  We discussed about her medication combinations.  She is willing to try Effexor at this time.  She stated that she is motivated to lose weight at this time.  She sleeps well at night.  She stated that lamotrigine has been helping her.  She denied having any suicidal homicidal ideation or plans.    She appears pleasant and cooperative during the interview.  She denied having any problems at her job.  She works long hours at [employer]..         Associated Signs/Symptoms: Depression Symptoms:  anxiety, disturbed sleep, (Hypo) Manic Symptoms:  Labiality of Mood, Anxiety Symptoms:  Excessive Worry, Psychotic Symptoms:  none PTSD Symptoms: Negative NA  Past Psychiatric History:  H/o depression. She has tried several medications in the past. She reported that the medications are not effective at this time. No h/o suicide attempts.   Previous Psychotropic Medications:  Ambien  Melatonin Celexa Ativan Zoloft Abilify- Dizziness   Substance Abuse History in the last 12 months:  Yes.   Social  drinker  Consequences of Substance Abuse: Negative NA  Past Medical History:  Past Medical History:  Diagnosis Date  . Anemia   . Asthma    WELL CONTROLLED  . Complication of anesthesia    -TACYCARDIA AFTER GASTRIC SURGERY IN 2016 AND HAD TO STAY 5 DAYS-itching after surgery 2005, 2006  . Depression   . Diabetes mellitus (HCC)   . Fatty liver   . GERD (gastroesophageal reflux disease)   . Heart murmur   . History of dermoid cyst excision   . History of hiatal hernia   . Hypertension    H/O  . IBS (irritable bowel syndrome)   . Migraine   . PONV (postoperative nausea and vomiting)    nausea    Past Surgical History:  Procedure Laterality Date  . CHOLECYSTECTOMY  2005  . DILATATION & CURETTAGE/HYSTEROSCOPY WITH MYOSURE N/A 03/30/2018   Procedure: DILATATION & CURETTAGE/HYSTEROSCOPY;  Surgeon: Natale Milch, MD;  Location: [facility] ORS;  Service: Gynecology;  Laterality: N/A;  . LAPAROSCOPIC GASTRIC RESTRICTIVE DUODENAL PROCEDURE (DUODENAL SWITCH) N/A 08/28/2015   Procedure: LAPAROSCOPIC GASTRIC RESTRICTIVE DUODENAL PROCEDURE (DUODENAL SWITCH);  Surgeon: [name], MD;  Location: [facility] ORS;  Service: General;  Laterality: N/A;  . 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 Hypertension Mother   . Diabetes Mother   . Asthma Brother   . Breast cancer Neg Hx   . Colon  cancer Neg Hx     Social History:   Social History   Socioeconomic History  . Marital status: Single    Spouse name: Not on file  . Number of children: 0  . Years of education: Not on file  . Highest education level: Not on file  Occupational History    Employer: [employer]  Social Needs  . Financial resource strain: Not on file  . Food insecurity:    Worry: Not on file    Inability: Not on file  . Transportation needs:    Medical: Not on file    Non-medical: Not on file  Tobacco Use  .  Smoking status: Never Smoker  . Smokeless tobacco: Never Used  Substance and Sexual Activity  . Alcohol use: Yes    Alcohol/week: 0.0 oz    Comment: social  . Drug use: No  . Sexual activity: Not Currently    Birth control/protection: None  Lifestyle  . Physical activity:    Days per week: 1 day    Minutes per session: 30 min  . Stress: Very much  Relationships  . Social connections:    Talks on phone: Not on file    Gets together: Not on file    Attends religious service: Not on file    Active member of club or organization: Not on file    Attends meetings of clubs or organizations: Not on file    Relationship status: Not on file  Other Topics Concern  . Not on file  Social History Narrative  . Not on file    Additional Social History:  [employer]- works 50 plus hours.  Had duodenal switch at [facility]- last Oct- lost 50 lbs since then.   Lives with cousin. Never married, no children.  Family lives in [place].    Allergies:   Allergies  Allergen Reactions  . Ibuprofen Other (See Comments)    Upset stomach   . Caffeine Other (See Comments)    Stomach issues    Metabolic Disorder Labs: Lab Results  Component Value Date   HGBA1C 7.1 01/07/2018   No results found for: PROLACTIN Lab Results  Component Value Date   CHOL 148 03/16/2017   TRIG 59 03/16/2017   HDL 59 03/16/2017   CHOLHDL 2.5 03/16/2017   LDLCALC 77 03/16/2017   LDLCALC 54 05/01/2016     Current Medications: Current Outpatient Medications  Medication Sig Dispense Refill  . acetaminophen (TYLENOL) 500 MG tablet Take 1,000 mg by mouth every 6 (six) hours as needed (for pain.).    Marland Kitchen albuterol (PROVENTIL HFA;VENTOLIN HFA) 108 (90 Base) MCG/ACT inhaler Inhale 2 puffs into the lungs every 6 (six) hours as needed for wheezing or shortness of breath. Reported on 05/01/2016 1 Inhaler 1  . azelastine (OPTIVAR) 0.05 % ophthalmic solution Place 1 drop into both eyes 2 (two) times daily as needed (for  allergic eye). Reported on 05/01/2016    . bisacodyl (DULCOLAX) 10 MG suppository Place 1 suppository (10 mg total) rectally as needed for moderate constipation. 12 suppository 0  . buPROPion (WELLBUTRIN SR) 100 MG 12 hr tablet Take 1 tablet (100 mg total) by mouth daily. (Patient taking differently: Take 100 mg by mouth every morning. ) 90 tablet 1  . CALCIUM CITRATE-VITAMIN D3 PO Take 1 tablet by mouth 3 (three) times a week.    . clindamycin (CLEOCIN T) 1 % external solution Apply 1 application topically 2 (two) times daily as needed (applied to breast, groin,  and underarms as needed.).    Marland Kitchen clindamycin (CLEOCIN) 300 MG capsule Take 300 mg by mouth See admin instructions. Take 1 capsule (300 mg) daily for 2 weeks as needed HS    . Fluocinolone Acetonide Body 0.01 % OIL Apply 1 application topically as needed. Applied daily to scalp    . fluticasone (FLOVENT HFA) 110 MCG/ACT inhaler Inhale 2 puffs into the lungs every morning.     Marland Kitchen glucose blood (PRECISION QID TEST) test strip Use 1 strip  twice a day  [dx 250.02]    . hydrOXYzine (ATARAX/VISTARIL) 25 MG tablet Take 25 mg by mouth at bedtime as needed (for itching caused by hidradenitis suppurativa (HS)).     Marland Kitchen ketoconazole (NIZORAL) 2 % shampoo Apply 1 application topically 3 (three) times a week. Use 2-3 times a week. Leave in for 5 minutes before rinsing out    . lamoTRIgine (LAMICTAL) 25 MG tablet Take 2 tablets (50 mg total) by mouth daily. (Patient taking differently: Take 50 mg by mouth every morning. ) 180 tablet 1  . levocetirizine (XYZAL) 5 MG tablet Take 1 tablet (5 mg total) by mouth every evening. Reported on 05/01/2016 30 tablet 1  . metFORMIN (GLUCOPHAGE-XR) 500 MG 24 hr tablet Take 1 tablet (500 mg total) by mouth 2 (two) times daily with a meal. 180 tablet 3  . montelukast (SINGULAIR) 10 MG tablet Take 10 mg by mouth at bedtime.    . Olopatadine HCl (PATANASE) 0.6 % SOLN Place 1 spray into the nose daily as needed (for allergies.).      Marland Kitchen pantoprazole (PROTONIX) 40 MG tablet Take 1 tablet (40 mg total) by mouth every morning. 30 tablet 5  . phentermine (ADIPEX-P) 37.5 MG tablet Take 37.5 mg by mouth daily.  0  . rifampin (RIFADIN) 300 MG capsule Take 300 mg by mouth See admin instructions. Take 1 capsule (300 mg) by mouth daily for 2 weeks for HS    . traZODone (DESYREL) 150 MG tablet Take 1 tablet (150 mg total) by mouth at bedtime. 90 tablet 1  . triamcinolone ointment (KENALOG) 0.1 % Apply 1 application topically 2 (two) times daily as needed (applied to affected areas of scalp).     Current Facility-Administered Medications  Medication Dose Route Frequency Provider Last Rate Last Dose  . polyethylene glycol (MIRALAX / GLYCOLAX) packet 17 g  17 g Oral Daily Schuman, Christanna R, MD        Neurologic: Headache: No Seizure: No Paresthesias:No  Musculoskeletal: Strength & Muscle Tone: within normal limits Gait & Station: normal Patient leans: N/A  Psychiatric Specialty Exam: Review of Systems  Psychiatric/Behavioral: Positive for depression. The patient has insomnia.   All other systems reviewed and are negative.   Blood pressure (!) 156/90, pulse 96, temperature 98.6 F (37 C), temperature source Oral, weight 240 lb 12.8 oz (109.2 kg), last menstrual period 04/07/2018.Body mass index is 45.5 kg/m.  General Appearance: Casual  Eye Contact:  Fair  Speech:  Clear and Coherent  Volume:  Normal  Mood:  Anxious  Affect:  Congruent  Thought Process:  Coherent  Orientation:  Full (Time, Place, and Person)  Thought Content:  Logical  Suicidal Thoughts:  No  Homicidal Thoughts:  No  Memory:  Immediate;   Fair Recent;   Fair Remote;   Fair  Judgement:  Fair  Insight:  Fair  Psychomotor Activity:  Normal  Concentration:  Concentration: Fair and Attention Span: Fair  Recall:  [employer] of  Knowledge:Fair  Language: Fair  Akathisia:  No  Handed:  Right  AIMS (if indicated):    Assets:  Communication  Skills Desire for Improvement Physical Health Social Support  ADL's:  Intact  Cognition: WNL  Sleep:  poor    Treatment Plan Summary: Medication management    Discussed with patient what the medications treatment risk benefits and alternatives.  Continue trazodone 150 mg by mouth daily at bedtime  Continue lamotrigine 50 mg by mouth daily.  I will start her on Effexor XR 25 mg in the morning.   Discontinue Wellbutrin  Start Adipex 37.5 mg to help with the weight loss  Discussed about the side effects of medication in detail and she agreed with the plan   Follow-up in 2 month or earlier depending on her symptoms.   Discussed with her about side effects of the medication she demonstrated understanding     More than 50% of the time spent in psychoeducation, counseling and coordination of care.    This note was generated in part or whole with voice recognition software. Voice regonition is usually quite accurate but there are transcription errors that can and very often do occur. I apologize for any typographical errors that were not detected and corrected.    Brandy HaleUzma Kessa Fairbairn, MD 6/10/20193:08 PM

## 2018-06-01 ENCOUNTER — Encounter: Payer: Self-pay | Admitting: Internal Medicine

## 2018-06-01 ENCOUNTER — Ambulatory Visit: Payer: Managed Care, Other (non HMO) | Admitting: Internal Medicine

## 2018-06-01 VITALS — BP 110/76 | HR 107 | Temp 99.1°F | Wt 231.1 lb

## 2018-06-01 DIAGNOSIS — F411 Generalized anxiety disorder: Secondary | ICD-10-CM

## 2018-06-01 DIAGNOSIS — E559 Vitamin D deficiency, unspecified: Secondary | ICD-10-CM | POA: Diagnosis not present

## 2018-06-01 DIAGNOSIS — E119 Type 2 diabetes mellitus without complications: Secondary | ICD-10-CM

## 2018-06-01 DIAGNOSIS — K219 Gastro-esophageal reflux disease without esophagitis: Secondary | ICD-10-CM

## 2018-06-01 DIAGNOSIS — F331 Major depressive disorder, recurrent, moderate: Secondary | ICD-10-CM | POA: Diagnosis not present

## 2018-06-01 DIAGNOSIS — Z9109 Other allergy status, other than to drugs and biological substances: Secondary | ICD-10-CM | POA: Diagnosis not present

## 2018-06-01 DIAGNOSIS — Z9889 Other specified postprocedural states: Secondary | ICD-10-CM | POA: Diagnosis not present

## 2018-06-01 DIAGNOSIS — I10 Essential (primary) hypertension: Secondary | ICD-10-CM

## 2018-06-01 DIAGNOSIS — L732 Hidradenitis suppurativa: Secondary | ICD-10-CM

## 2018-06-01 LAB — HM DIABETES FOOT EXAM

## 2018-06-01 NOTE — Progress Notes (Signed)
Patient ID: Shirley [name], female   DOB: 02-19-1981, 37 y.o.   MRN: [id number]   Subjective:    Patient ID: Shirley [name], female    DOB: [birth year], 37 y.o.   MRN: [id number]  HPI  Patient here for a scheduled follow up and to have work form completed.  Saw psychiatry 05/03/18.  Recommended continuing trazodone and lamotrigine.  Also started on effexor.  Stopped wellbutrin.  Overall she feels she is doing better.  Feels better.  Handling stress.  Also started on adipex.  Discussed the need to follow blood pressure, etc.  She is also s/p operative hysteroscopy for uterine polyp.  Had some constipation after surgery.  Overall improved now.  Still having some pain in her "left ovary region".  Is better.  States gyn discussed GI referral to compete the work up.  Discussed with her today.   She is having problems with hidradenitis.  Under left arm.  Had started abx yesterday.  Made her feel bad.  Discussed warm compresses and f/u with Dr Otho Ket.  No fever. No increased redness.  Has persistent area on her back as well.  No chest pain.  Breathing stable.  Swallowing ok.  No nausea or vomiting.  States blood sugar averaging 120-150-164.     Past Medical History:  Diagnosis Date  . Anemia   . Asthma    WELL CONTROLLED  . Complication of anesthesia    -TACYCARDIA AFTER GASTRIC SURGERY IN 2016 AND HAD TO STAY 5 DAYS-itching after surgery 2005, 2006  . Depression   . Diabetes mellitus ([place])   . Fatty liver   . GERD (gastroesophageal reflux disease)   . Heart murmur   . History of dermoid cyst excision   . History of hiatal hernia   . Hypertension    H/O  . IBS (irritable bowel syndrome)   . Migraine   . PONV (postoperative nausea and vomiting)    nausea   Past Surgical History:  Procedure Laterality Date  . CHOLECYSTECTOMY  2005  . DILATATION & CURETTAGE/HYSTEROSCOPY WITH MYOSURE N/A 03/30/2018   Procedure: DILATATION & CURETTAGE/HYSTEROSCOPY;  Surgeon: Homero Fellers, MD;  Location:  [facility] ORS;  Service: Gynecology;  Laterality: N/A;  . LAPAROSCOPIC GASTRIC RESTRICTIVE DUODENAL PROCEDURE (DUODENAL SWITCH) N/A 08/28/2015   Procedure: LAPAROSCOPIC GASTRIC RESTRICTIVE DUODENAL PROCEDURE (DUODENAL SWITCH);  Surgeon: Ladora Daniel, MD;  Location: [facility] ORS;  Service: General;  Laterality: N/A;  . 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 Hypertension Mother   . Diabetes Mother   . Asthma Brother   . Breast cancer Neg Hx   . Colon cancer Neg Hx    Social History   Socioeconomic History  . Marital status: Single    Spouse name: Not on file  . Number of children: 0  . Years of education: Not on file  . Highest education level: Not on file  Occupational History    Employer: [employer]  . Financial resource strain: Not on file  . Food insecurity:    Worry: Not on file    Inability: Not on file  . Transportation needs:    Medical: Not on file    Non-medical: Not on file  Tobacco Use  . Smoking status: Never Smoker  . Smokeless tobacco: Never Used  Substance and Sexual Activity  . Alcohol use: Yes    Alcohol/week: 0.0 oz    Comment: social  .  Drug use: No  . Sexual activity: Not Currently    Birth control/protection: None  Lifestyle  . Physical activity:    Days per week: 1 day    Minutes per session: 30 min  . Stress: Very much  Relationships  . Social connections:    Talks on phone: Not on file    Gets together: Not on file    Attends religious service: Not on file    Active member of club or organization: Not on file    Attends meetings of clubs or organizations: Not on file    Relationship status: Not on file  Other Topics Concern  . Not on file  Social History Narrative  . Not on file    Outpatient Encounter Medications as of 06/01/2018  Medication Sig  . albuterol (PROVENTIL HFA;VENTOLIN HFA) 108 (90 Base) MCG/ACT inhaler Inhale 2 puffs into the lungs every 6 (six) hours as needed for  wheezing or shortness of breath. Reported on 05/01/2016  . azelastine (OPTIVAR) 0.05 % ophthalmic solution Place 1 drop into both eyes 2 (two) times daily as needed (for allergic eye). Reported on 05/01/2016  . CALCIUM CITRATE-VITAMIN D3 PO Take 1 tablet by mouth 3 (three) times a week.  . clindamycin (CLEOCIN T) 1 % external solution Apply 1 application topically 2 (two) times daily as needed (applied to breast, groin, and underarms as needed.).  Marland Kitchen clindamycin (CLEOCIN) 300 MG capsule Take 300 mg by mouth See admin instructions. Take 1 capsule (300 mg) daily for 2 weeks as needed HS  . Fluocinolone Acetonide Body 0.01 % OIL Apply 1 application topically as needed. Applied daily to scalp  . fluticasone (FLOVENT HFA) 110 MCG/ACT inhaler Inhale 2 puffs into the lungs every morning.   Marland Kitchen glucose blood (PRECISION QID TEST) test strip Use 1 strip  twice a day  [dx 250.02]  . hydrOXYzine (ATARAX/VISTARIL) 25 MG tablet Take 25 mg by mouth at bedtime as needed (for itching caused by hidradenitis suppurativa (HS)).   Marland Kitchen ketoconazole (NIZORAL) 2 % shampoo Apply 1 application topically 3 (three) times a week. Use 2-3 times a week. Leave in for 5 minutes before rinsing out  . lamoTRIgine (LAMICTAL) 25 MG tablet Take 2 tablets (50 mg total) by mouth daily.  Marland Kitchen levocetirizine (XYZAL) 5 MG tablet Take 1 tablet (5 mg total) by mouth every evening. Reported on 05/01/2016  . metFORMIN (GLUCOPHAGE-XR) 500 MG 24 hr tablet Take 1 tablet (500 mg total) by mouth 2 (two) times daily with a meal.  . montelukast (SINGULAIR) 10 MG tablet Take 10 mg by mouth at bedtime.  . Olopatadine HCl (PATANASE) 0.6 % SOLN Place 1 spray into the nose daily as needed (for allergies.).   Marland Kitchen pantoprazole (PROTONIX) 40 MG tablet Take 1 tablet (40 mg total) by mouth every morning.  . phentermine (ADIPEX-P) 37.5 MG tablet Take 1 tablet (37.5 mg total) by mouth daily.  . rifampin (RIFADIN) 300 MG capsule Take 300 mg by mouth See admin instructions. Take 1  capsule (300 mg) by mouth daily for 2 weeks for HS  . traZODone (DESYREL) 150 MG tablet Take 1 tablet (150 mg total) by mouth at bedtime.  . triamcinolone ointment (KENALOG) 0.1 % Apply 1 application topically 2 (two) times daily as needed (applied to affected areas of scalp).  . venlafaxine XR (EFFEXOR XR) 75 MG 24 hr capsule Take 1 capsule (75 mg total) by mouth daily with breakfast.   Facility-Administered Encounter Medications as of 06/01/2018  Medication  .  polyethylene glycol (MIRALAX / GLYCOLAX) packet 17 g    Review of Systems  Constitutional: Negative for appetite change and unexpected weight change.  HENT: Negative for congestion and sinus pressure.   Respiratory: Negative for cough, chest tightness and shortness of breath.   Cardiovascular: Negative for chest pain, palpitations and leg swelling.  Gastrointestinal: Negative for abdominal pain, diarrhea and nausea.  Genitourinary: Negative for difficulty urinating and dysuria.  Musculoskeletal: Negative for joint swelling and myalgias.  Skin: Negative for color change and rash.       Hidradenitis - left axilla.  Also persistent area on back.  No increased redness.    Neurological: Negative for dizziness, light-headedness and headaches.  Psychiatric/Behavioral: Negative for agitation and dysphoric mood.       Objective:    Physical Exam  Constitutional: She appears well-developed and well-nourished. No distress.  HENT:  Nose: Nose normal.  Mouth/Throat: Oropharynx is clear and moist.  Neck: Neck supple. No thyromegaly present.  Cardiovascular: Normal rate and regular rhythm.  Pulmonary/Chest: Breath sounds normal. No respiratory distress. She has no wheezes.  Abdominal: Soft. Bowel sounds are normal. There is no tenderness.  Musculoskeletal: She exhibits no edema or tenderness.  Feet:  No lesions.  DP pulses palpable and equal bilaterally.  Intact to pin prick and light touch.    Lymphadenopathy:    She has no cervical  adenopathy.  Skin: No rash noted. No erythema.  Hidradenitis - left axilla.  No increased erythema.  Minimal tenderness.  Persistent - back lesion.    Psychiatric: She has a normal mood and affect. Her behavior is normal.    BP 110/76 (BP Location: Left Arm, Patient Position: Sitting, Cuff Size: Large)   Pulse (!) 107   Temp 99.1 F (37.3 C) (Oral)   Wt 231 lb 2 oz (104.8 kg)   SpO2 99%   BMI 43.67 kg/m  Wt Readings from Last 3 Encounters:  06/01/18 231 lb 2 oz (104.8 kg)  04/07/18 236 lb (107 kg)  04/06/18 238 lb 8 oz (108.2 kg)     Lab Results  Component Value Date   WBC 6.9 03/30/2018   HGB 12.3 03/30/2018   HCT 38.7 03/30/2018   PLT 228 03/30/2018   GLUCOSE 109 (H) 03/26/2018   CHOL 148 03/16/2017   TRIG 59 03/16/2017   HDL 59 03/16/2017   LDLCALC 77 03/16/2017   ALT 16 03/26/2018   AST 18 03/26/2018   NA 138 03/26/2018   K 3.9 03/26/2018   CL 104 03/26/2018   CREATININE 0.64 03/26/2018   BUN 11 03/26/2018   CO2 27 03/26/2018   TSH 1.120 08/10/2015   HGBA1C 7.1 01/07/2018       Assessment & Plan:   Problem List Items Addressed This Visit    Diabetes mellitus ([place])    Sugars as outlined.  Low carb diet and exercise.  Follow met b and a1c.        Environmental allergies    Followed by Dr Donneta Romberg.  Stable on current regimen.        Generalized anxiety disorder    Followed by psychiatry.  Medication being adjusted.        GERD (gastroesophageal reflux disease)    Controlled on current regimen.  Follow.       Hypertension    Blood pressure as outlined.  Need to follow since starting adipex.  Have her spot check her pressure.        Major depressive disorder, recurrent episode,  moderate ([place])    Followed by psychiatry.  Medication being adjusted.        Severe obesity (BMI >= 40) (HCC)    Discussed diet and exercise.  Follow.        Status post hysteroscopy    S/p recent D&C.  Doing well.  Pain has improved.  Still with some left lower  quadrant discomfort.  Discussed GI evaluation.  She wants to hold on further testing since improved.  Follow.        Vitamin D deficiency    Follow vitamin D level.         Other Visit Diagnoses    Hydradenitis    -  Primary   Recurrence left axilla.  Also has persistent lesion on her back.  Discussed warm compresses.  Refer back to Dr Otho Ket.     Relevant Orders   Ambulatory referral to Dermatology       Einar Pheasant, MD

## 2018-06-08 ENCOUNTER — Encounter: Payer: Self-pay | Admitting: Internal Medicine

## 2018-06-08 NOTE — Assessment & Plan Note (Signed)
Blood pressure as outlined.  Need to follow since starting adipex.  Have her spot check her pressure.

## 2018-06-08 NOTE — Assessment & Plan Note (Signed)
Followed by Dr Sharma.  Stable on current regimen.   

## 2018-06-08 NOTE — Assessment & Plan Note (Signed)
Followed by psychiatry.  Medication being adjusted.

## 2018-06-08 NOTE — Assessment & Plan Note (Signed)
Controlled on current regimen.  Follow.  

## 2018-06-08 NOTE — Assessment & Plan Note (Signed)
Discussed diet and exercise.  Follow.  

## 2018-06-08 NOTE — Assessment & Plan Note (Signed)
Sugars as outlined.  Low carb diet and exercise.  Follow met b and a1c.

## 2018-06-08 NOTE — Assessment & Plan Note (Signed)
Follow vitamin D level.  

## 2018-06-08 NOTE — Assessment & Plan Note (Signed)
Followed by psychiatry.  Medication being adjusted.   

## 2018-06-08 NOTE — Assessment & Plan Note (Signed)
S/p recent D&C.  Doing well.  Pain has improved.  Still with some left lower quadrant discomfort.  Discussed GI evaluation.  She wants to hold on further testing since improved.  Follow.

## 2018-06-09 DIAGNOSIS — R1013 Epigastric pain: Secondary | ICD-10-CM | POA: Insufficient documentation

## 2018-06-09 DIAGNOSIS — Z6841 Body Mass Index (BMI) 40.0 and over, adult: Secondary | ICD-10-CM | POA: Insufficient documentation

## 2018-06-16 ENCOUNTER — Other Ambulatory Visit: Payer: Self-pay | Admitting: Internal Medicine

## 2018-06-16 LAB — LIPID PANEL
Chol/HDL Ratio: 2.3 ratio (ref 0.0–4.4)
Cholesterol, Total: 143 mg/dL (ref 100–199)
HDL: 62 mg/dL (ref >39)
LDL Calculated: 63 mg/dL (ref 0–99)
Triglycerides: 91 mg/dL (ref 0–149)
VLDL Cholesterol Cal: 18 mg/dL (ref 5–40)

## 2018-06-16 LAB — HEMOGLOBIN A1C
Est. average glucose Bld gHb Est-mCnc: 148 mg/dL
Hgb A1c MFr Bld: 6.8 % — ABNORMAL HIGH (ref 4.8–5.6)

## 2018-06-16 LAB — TSH: TSH: 1.42 u[IU]/mL (ref 0.450–4.500)

## 2018-06-16 LAB — BASIC METABOLIC PANEL
BUN/Creatinine Ratio: 11 (ref 9–23)
BUN: 9 mg/dL (ref 6–20)
CO2: 22 mmol/L (ref 20–29)
Calcium: 9 mg/dL (ref 8.7–10.2)
Chloride: 101 mmol/L (ref 96–106)
Creatinine, Ser: 0.79 mg/dL (ref 0.57–1.00)
GFR calc Af Amer: 109 mL/min/{1.73_m2} (ref >59)
GFR calc non Af Amer: 95 mL/min/{1.73_m2} (ref >59)
Glucose: 112 mg/dL — ABNORMAL HIGH (ref 65–99)
Potassium: 4.4 mmol/L (ref 3.5–5.2)
Sodium: 139 mmol/L (ref 134–144)

## 2018-06-16 LAB — HEPATIC FUNCTION PANEL
ALT: 14 IU/L (ref 0–32)
AST: 15 IU/L (ref 0–40)
Albumin: 4 g/dL (ref 3.5–5.5)
Alkaline Phosphatase: 110 IU/L (ref 39–117)
Bilirubin Total: 0.3 mg/dL (ref 0.0–1.2)
Bilirubin, Direct: 0.09 mg/dL (ref 0.00–0.40)
Total Protein: 6.4 g/dL (ref 6.0–8.5)

## 2018-06-16 LAB — MICROALBUMIN / CREATININE URINE RATIO
Creatinine, Urine: 282.9 mg/dL
Microalb/Creat Ratio: 6.4 mg/g creat (ref 0.0–30.0)
Microalbumin, Urine: 18.2 ug/mL

## 2018-06-16 LAB — VITAMIN D 25 HYDROXY (VIT D DEFICIENCY, FRACTURES): Vit D, 25-Hydroxy: 13.8 ng/mL — ABNORMAL LOW (ref 30.0–100.0)

## 2018-06-16 MED ORDER — VITAMIN D (ERGOCALCIFEROL) 1.25 MG (50000 UNIT) PO CAPS: 50000.0000 [IU] | ORAL_CAPSULE | ORAL | 0 refills | Status: DC

## 2018-06-16 NOTE — Progress Notes (Signed)
Ergocalciferol rx sent in to pharmacy.  

## 2018-06-28 ENCOUNTER — Other Ambulatory Visit: Payer: Self-pay

## 2018-06-28 ENCOUNTER — Ambulatory Visit (INDEPENDENT_AMBULATORY_CARE_PROVIDER_SITE_OTHER): Payer: 59 | Admitting: Psychiatry

## 2018-06-28 ENCOUNTER — Encounter: Payer: Self-pay | Admitting: Psychiatry

## 2018-06-28 VITALS — BP 145/86 | HR 98 | Temp 98.8°F | Wt 229.8 lb

## 2018-06-28 DIAGNOSIS — F331 Major depressive disorder, recurrent, moderate: Secondary | ICD-10-CM

## 2018-06-28 DIAGNOSIS — F411 Generalized anxiety disorder: Secondary | ICD-10-CM

## 2018-06-28 MED ORDER — VENLAFAXINE HCL ER 75 MG PO CP24: 75.0000 mg | ORAL_CAPSULE | Freq: Every day | ORAL | 1 refills | Status: DC

## 2018-06-28 MED ORDER — LAMOTRIGINE 25 MG PO TABS: 50.0000 mg | ORAL_TABLET | Freq: Every day | ORAL | 1 refills | Status: DC

## 2018-06-28 MED ORDER — TRAZODONE HCL 150 MG PO TABS: 150.0000 mg | ORAL_TABLET | Freq: Every day | ORAL | 1 refills | Status: DC

## 2018-06-28 NOTE — Progress Notes (Signed)
Psychiatric MD Progress Note  Patient Identification: Shirley [name] MRN:  [id number] Date of Evaluation:  06/28/2018 Referral Source: Corrie Dandy- Therapist Chief Complaint:   Chief Complaint    Follow-up; Medication Refill     Visit Diagnosis:    ICD-10-CM   1. MDD (major depressive disorder), recurrent episode, moderate (HCC) F33.1   2. Generalized anxiety disorder F41.1     History of Present Illness:   Patient is a 37 year old female who presented for follow up.   Patient reported that she has been having diarrhea and abdominal pain and reported that she was not feeling well.  She reported that he has lost almost 10 pounds.  We discussed about her medications.  She reported that she wants to have her medications adjusted.  She was started on Adipex but it is not helping her.  She wants to stop the medication..  Her anxiety and mood symptoms are under control.  She is working long hours at this time.  She reported that she wants to continue her medications and sleeps well with the help of trazodone at night.  She has several issues with her family members as her mood remains agitated throughout the day.  We discussed about stopping Adipex and she is agreeable with the plan.    She appears anxious during the interview.   Associated Signs/Symptoms: Depression Symptoms:  anxiety, disturbed sleep, (Hypo) Manic Symptoms:  Labiality of Mood, Anxiety Symptoms:  Excessive Worry, Psychotic Symptoms:  none PTSD Symptoms: Negative NA  Past Psychiatric History:  H/o depression. She has tried several medications in the past. She reported that the medications are not effective at this time. No h/o suicide attempts.   Previous Psychotropic Medications:  Ambien  Melatonin Celexa Ativan Zoloft Abilify- Dizziness   Substance Abuse History in the last 12 months:  Yes.   Social drinker  Consequences of Substance Abuse: Negative NA  Past Medical History:  Past Medical History:   Diagnosis Date  . Anemia   . Asthma    WELL CONTROLLED  . Complication of anesthesia    -TACYCARDIA AFTER GASTRIC SURGERY IN 2016 AND HAD TO STAY 5 DAYS-itching after surgery 2005, 2006  . Depression   . Diabetes mellitus (HCC)   . Fatty liver   . GERD (gastroesophageal reflux disease)   . Heart murmur   . History of dermoid cyst excision   . History of hiatal hernia   . Hypertension    H/O  . IBS (irritable bowel syndrome)   . Migraine   . PONV (postoperative nausea and vomiting)    nausea    Past Surgical History:  Procedure Laterality Date  . CHOLECYSTECTOMY  2005  . DILATATION & CURETTAGE/HYSTEROSCOPY WITH MYOSURE N/A 03/30/2018   Procedure: DILATATION & CURETTAGE/HYSTEROSCOPY;  Surgeon: Natale Milch, MD;  Location: [facility];  Service: Gynecology;  Laterality: N/A;  . LAPAROSCOPIC GASTRIC RESTRICTIVE DUODENAL PROCEDURE (DUODENAL SWITCH) N/A 08/28/2015   Procedure: LAPAROSCOPIC GASTRIC RESTRICTIVE DUODENAL PROCEDURE (DUODENAL SWITCH);  Surgeon: [name], MD;  Location: [facility];  Service: General;  Laterality: N/A;  . 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 Hypertension Mother   . Diabetes Mother   . Asthma Brother   . Breast cancer Neg Hx   . Colon cancer Neg Hx     Social History:   Social History   Socioeconomic History  .  Marital status: Single    Spouse name: Not on file  . Number of children: 0  . Years of education: Not on file  . Highest education level: Not on file  Occupational History    Employer: [employer]  Social Needs  . Financial resource strain: Not on file  . Food insecurity:    Worry: Not on file    Inability: Not on file  . Transportation needs:    Medical: Not on file    Non-medical: Not on file  Tobacco Use  . Smoking status: Never Smoker  . Smokeless tobacco: Never Used  Substance and Sexual Activity  . Alcohol use:  Yes    Alcohol/week: 0.0 oz    Comment: social  . Drug use: No  . Sexual activity: Not Currently    Birth control/protection: None  Lifestyle  . Physical activity:    Days per week: 1 day    Minutes per session: 30 min  . Stress: Very much  Relationships  . Social connections:    Talks on phone: Not on file    Gets together: Not on file    Attends religious service: Not on file    Active member of club or organization: Not on file    Attends meetings of clubs or organizations: Not on file    Relationship status: Not on file  Other Topics Concern  . Not on file  Social History Narrative  . Not on file    Additional Social History:  [employer]- works 50 plus hours.  Had duodenal switch at [facility]- last Oct- lost 50 lbs since then.   Lives with cousin. Never married, no children.  Family lives in [place].    Allergies:   Allergies  Allergen Reactions  . Ibuprofen Other (See Comments)    Upset stomach   . Caffeine Other (See Comments)    Stomach issues    Metabolic Disorder Labs: Lab Results  Component Value Date   HGBA1C 6.8 (H) 06/15/2018   No results found for: PROLACTIN Lab Results  Component Value Date   CHOL 143 06/15/2018   TRIG 91 06/15/2018   HDL 62 06/15/2018   CHOLHDL 2.3 06/15/2018   LDLCALC 63 06/15/2018   LDLCALC 77 03/16/2017     Current Medications: Current Outpatient Medications  Medication Sig Dispense Refill  . albuterol (PROVENTIL HFA;VENTOLIN HFA) 108 (90 Base) MCG/ACT inhaler Inhale 2 puffs into the lungs every 6 (six) hours as needed for wheezing or shortness of breath. Reported on 05/01/2016 1 Inhaler 1  . azelastine (OPTIVAR) 0.05 % ophthalmic solution Place 1 drop into both eyes 2 (two) times daily as needed (for allergic eye). Reported on 05/01/2016    . CALCIUM CITRATE-VITAMIN D3 PO Take 1 tablet by mouth 3 (three) times a week.    . clindamycin (CLEOCIN T) 1 % external solution Apply 1 application topically 2 (two) times daily as  needed (applied to breast, groin, and underarms as needed.).    Marland Kitchen clindamycin (CLEOCIN) 300 MG capsule Take 300 mg by mouth See admin instructions. Take 1 capsule (300 mg) daily for 2 weeks as needed HS    . Fluocinolone Acetonide Body 0.01 % OIL Apply 1 application topically as needed. Applied daily to scalp    . fluticasone (FLOVENT HFA) 110 MCG/ACT inhaler Inhale 2 puffs into the lungs every morning.     Marland Kitchen glucose blood (PRECISION QID TEST) test strip Use 1 strip  twice a day  [dx 250.02]    .  hydrOXYzine (ATARAX/VISTARIL) 25 MG tablet Take 25 mg by mouth at bedtime as needed (for itching caused by hidradenitis suppurativa (HS)).     Marland Kitchen. ketoconazole (NIZORAL) 2 % shampoo Apply 1 application topically 3 (three) times a week. Use 2-3 times a week. Leave in for 5 minutes before rinsing out    . lamoTRIgine (LAMICTAL) 25 MG tablet Take 2 tablets (50 mg total) by mouth daily. 180 tablet 1  . levocetirizine (XYZAL) 5 MG tablet Take 1 tablet (5 mg total) by mouth every evening. Reported on 05/01/2016 30 tablet 1  . metFORMIN (GLUCOPHAGE-XR) 500 MG 24 hr tablet Take 1 tablet (500 mg total) by mouth 2 (two) times daily with a meal. 180 tablet 3  . montelukast (SINGULAIR) 10 MG tablet Take 10 mg by mouth at bedtime.    . Olopatadine HCl (PATANASE) 0.6 % SOLN Place 1 spray into the nose daily as needed (for allergies.).     Marland Kitchen. pantoprazole (PROTONIX) 40 MG tablet Take 1 tablet (40 mg total) by mouth every morning. 30 tablet 5  . phentermine (ADIPEX-P) 37.5 MG tablet Take 1 tablet (37.5 mg total) by mouth daily. 30 tablet 1  . rifampin (RIFADIN) 300 MG capsule Take 300 mg by mouth See admin instructions. Take 1 capsule (300 mg) by mouth daily for 2 weeks for HS    . traZODone (DESYREL) 150 MG tablet Take 1 tablet (150 mg total) by mouth at bedtime. 90 tablet 1  . triamcinolone ointment (KENALOG) 0.1 % Apply 1 application topically 2 (two) times daily as needed (applied to affected areas of scalp).    .  venlafaxine XR (EFFEXOR XR) 75 MG 24 hr capsule Take 1 capsule (75 mg total) by mouth daily with breakfast. 30 capsule 1  . Vitamin D, Ergocalciferol, (DRISDOL) 50000 units CAPS capsule Take 1 capsule (50,000 Units total) by mouth every 7 (seven) days. 12 capsule 0   Current Facility-Administered Medications  Medication Dose Route Frequency Provider Last Rate Last Dose  . polyethylene glycol (MIRALAX / GLYCOLAX) packet 17 g  17 g Oral Daily Schuman, Christanna R, MD        Neurologic: Headache: No Seizure: No Paresthesias:No  Musculoskeletal: Strength & Muscle Tone: within normal limits Gait & Station: normal Patient leans: N/A  Psychiatric Specialty Exam: Review of Systems  Psychiatric/Behavioral: Positive for depression. The patient has insomnia.   All other systems reviewed and are negative.   Blood pressure (!) 145/86, pulse 98, temperature 98.8 F (37.1 C), temperature source Oral, weight 229 lb 12.8 oz (104.2 kg).Body mass index is 43.42 kg/m.  General Appearance: Casual  Eye Contact:  Fair  Speech:  Clear and Coherent  Volume:  Normal  Mood:  Anxious  Affect:  Congruent  Thought Process:  Coherent  Orientation:  Full (Time, Place, and Person)  Thought Content:  Logical  Suicidal Thoughts:  No  Homicidal Thoughts:  No  Memory:  Immediate;   Fair Recent;   Fair Remote;   Fair  Judgement:  Fair  Insight:  Fair  Psychomotor Activity:  Normal  Concentration:  Concentration: Fair and Attention Span: Fair  Recall:  FiservFair  Fund of Knowledge:Fair  Language: Fair  Akathisia:  No  Handed:  Right  AIMS (if indicated):    Assets:  Communication Skills Desire for Improvement Physical Health Social Support  ADL's:  Intact  Cognition: WNL  Sleep:  poor    Treatment Plan Summary: Medication management    Discussed with patient what the medications  treatment risk benefits and alternatives.  Continue trazodone 150 mg by mouth daily at bedtime  Continue lamotrigine  50 mg by mouth daily.  I will start her on Effexor XR 25 mg in the morning.    D/C Adipex 37.5 mg   Discussed about the side effects of medication in detail and she agreed with the plan   Follow-up in 2 month or earlier depending on her symptoms.   Discussed with her about side effects of the medication she demonstrated understanding     More than 50% of the time spent in psychoeducation, counseling and coordination of care.    This note was generated in part or whole with voice recognition software. Voice regonition is usually quite accurate but there are transcription errors that can and very often do occur. I apologize for any typographical errors that were not detected and corrected.    Brandy Hale, MD 8/5/20193:50 PM

## 2018-07-07 ENCOUNTER — Encounter: Payer: Self-pay | Admitting: *Deleted

## 2018-07-07 DIAGNOSIS — E559 Vitamin D deficiency, unspecified: Secondary | ICD-10-CM

## 2018-07-13 ENCOUNTER — Other Ambulatory Visit: Payer: Self-pay | Admitting: Internal Medicine

## 2018-07-14 NOTE — Telephone Encounter (Signed)
Please notify her that she will need a repeat labs. I would like for her to repeat her vitamin D level and then we will see what dose she needs to continue.  [facility]  I will place the order for the lab.  She can go by and have drawn.  Non fasting lab.

## 2018-07-15 ENCOUNTER — Encounter: Payer: Self-pay | Admitting: Internal Medicine

## 2018-07-15 LAB — VITAMIN D 25 HYDROXY (VIT D DEFICIENCY, FRACTURES): Vit D, 25-Hydroxy: 26.4 ng/mL — ABNORMAL LOW (ref 30.0–100.0)

## 2018-08-03 ENCOUNTER — Ambulatory Visit: Payer: Managed Care, Other (non HMO) | Admitting: Internal Medicine

## 2018-08-03 ENCOUNTER — Encounter: Payer: Self-pay | Admitting: Internal Medicine

## 2018-08-03 VITALS — BP 134/86 | HR 85 | Resp 16 | Ht 61.0 in | Wt 235.0 lb

## 2018-08-03 DIAGNOSIS — E119 Type 2 diabetes mellitus without complications: Secondary | ICD-10-CM

## 2018-08-03 DIAGNOSIS — Z23 Encounter for immunization: Secondary | ICD-10-CM

## 2018-08-03 LAB — POCT GLYCOSYLATED HEMOGLOBIN (HGB A1C): Hemoglobin A1C: 6.9 % — AB (ref 4.0–5.6)

## 2018-08-03 NOTE — Patient Instructions (Addendum)
Please continue: - Metformin ER 500 mg 2x a day  Check sugars every day, rotating check times.  Please return in 4 months with your sugar log.

## 2018-08-03 NOTE — Progress Notes (Signed)
Patient ID: Shirley [name], female   DOB: 1981/03/29, 37 y.o.   MRN: [id number]  HPI: Shirley [name] is a 37 y.o.-year-old female, returning for f/u for DM2, dx 2005, non-insulin-dependent, uncontrolled, with complications (MAU). Last visit 7 months ago.  She has a history of gastric bypass surgery (sleeve gastrectomy) in 08/2015.  She did have complications after the surgery: Hypoglycemia and hypotension, both resolved.  Her diabetes dramatically improved after the surgery (she also lost ~50 pounds) and she could come off all of the medicines. Since then, sugars worsened, and we restarted Metformin ER in 02/2018.  At last visit, she was describing GERD and abdominal pain.  She saw her bariatric surgeon and a barium swallow showed abnormal stomach architecture >> she was told that she may need a revision of her surgery.  However, since then, she was started on acid reflux medicines and her symptoms improved significantly.  Last hemoglobin A1c was: Lab Results  Component Value Date   HGBA1C 6.8 (H) 06/15/2018   HGBA1C 7.1 01/07/2018   HGBA1C 6.2 (H) 03/16/2017  07/15/2015: 7.1% 07/13/2014: 7.2%  - at work 01/2014: 7.8% 08/2013: 7.2%  She is now on: - Metformin ER 500 mg 2x a day  Previous regimen: - Januvia 100 mg in am - Metformin XR (osm) 1000 mg tabs 2x a day with meals >> some loose stools - Invokana 100 - added 03/2014 She was on Janumet XR 1000-100 mg po with dinner (used to cut them in half to swallow them better!!!) She was on Metformin in the past >> diarrhea at max dose  She checks her sugars 0 to once a day: - am:  90-100 >> 90s >> 85-99 >> 122-130, 147 - 2h after b'fast: 120s >> 110-120s, 145 >> 149 - before lunch: 98-120 >> 104-149, 161 >> n/c - 2h after lunch: 87, 100-121, 146 >> 130 - before dinner: 131-162 >> 100-136 >> n/c - 2h after dinner: 147-180:ABx and Pred. >>120s >> n/c - bedtime: n/c - nighttime: n/c Lowest sugar was 90 >> 85 >> 100;  It is unclear at  which level she has hypoglycemia awareness Highest sugar was 145 >> 247 >> 200.  -No CKD, last BUN/creatinine normal:  Lab Results  Component Value Date   BUN 9 06/15/2018   CREATININE 0.79 06/15/2018  On losartan.  Normal ACR: Lab Results  Component Value Date   MICRALBCREAT 11.2 07/31/2016   MICRALBCREAT 12.1 08/10/2015   -No HL: Lab Results  Component Value Date   CHOL 143 06/15/2018   HDL 62 06/15/2018   LDLCALC 63 06/15/2018   TRIG 91 06/15/2018   CHOLHDL 2.3 06/15/2018  Not on a statin - last eye exam was in 11/2017: No DR -No numbness and tingling in her feet.  She also has a history of vitamin D deficiency (on vitamin 5000 units), HTN, GERD.   ROS: Constitutional: no weight gain/no weight loss, no fatigue, no subjective hyperthermia, no subjective hypothermia Eyes: no blurry vision, no xerophthalmia ENT: no sore throat, no nodules palpated in throat, no dysphagia, no odynophagia, no hoarseness Cardiovascular: no CP/no SOB/no palpitations/no leg swelling Respiratory: no cough/no SOB/no wheezing Gastrointestinal: no N/no V/no D/no C/no acid reflux Musculoskeletal: no muscle aches/no joint aches Skin: no rashes, no hair loss Neurological: no tremors/no numbness/no tingling/no dizziness  I reviewed pt's medications, allergies, PMH, social hx, family hx, and changes were documented in the history of present illness. Otherwise, unchanged from my initial visit note.  Past Medical History:  Diagnosis Date  . Anemia   . Asthma    WELL CONTROLLED  . Complication of anesthesia    -TACYCARDIA AFTER GASTRIC SURGERY IN 2016 AND HAD TO STAY 5 DAYS-itching after surgery 2005, 2006  . Depression   . Diabetes mellitus (HCC)   . Fatty liver   . GERD (gastroesophageal reflux disease)   . Heart murmur   . History of dermoid cyst excision   . History of hiatal hernia   . Hypertension    H/O  . IBS (irritable bowel syndrome)   . Migraine   . PONV (postoperative nausea  and vomiting)    nausea   Past Surgical History:  Procedure Laterality Date  . CHOLECYSTECTOMY  2005  . DILATATION & CURETTAGE/HYSTEROSCOPY WITH MYOSURE N/A 03/30/2018   Procedure: DILATATION & CURETTAGE/HYSTEROSCOPY;  Surgeon: Natale Milch, MD;  Location: [facility] ORS;  Service: Gynecology;  Laterality: N/A;  . LAPAROSCOPIC GASTRIC RESTRICTIVE DUODENAL PROCEDURE (DUODENAL SWITCH) N/A 08/28/2015   Procedure: LAPAROSCOPIC GASTRIC RESTRICTIVE DUODENAL PROCEDURE (DUODENAL SWITCH);  Surgeon: [name], MD;  Location: [facility] ORS;  Service: General;  Laterality: N/A;  . 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 Marital status: Single    Spouse name: Not on file  . Number of children: 0  . Years of education: Not on file  . Highest education level: Not on file  Occupational History    Employer: [employer]  Social Needs  . Financial resource strain: Not on file  . Food insecurity:    Worry: Not on file    Inability: Not on file  . Transportation needs:    Medical: Not on file    Non-medical: Not on file  Tobacco Use  . Smoking status: Never Smoker  . Smokeless tobacco: Never Used  Substance and Sexual Activity  . Alcohol use: Yes    Alcohol/week: 0.0 standard drinks    Comment: social  . Drug use: No  . Sexual activity: Not Currently    Birth control/protection: None  Lifestyle  . Physical activity:    Days per week: 1 day    Minutes per session: 30 min  . Stress: Very much  Relationships  . Social connections:    Talks on phone: Not on file    Gets together: Not on file    Attends religious service: Not on file    Active member of club or organization: Not on file    Attends meetings of clubs or organizations: Not on file    Relationship status: Not on file  . Intimate partner violence:    Fear of current or ex partner: Not on file    Emotionally abused: Not on file    Physically abused: Not on file    Forced sexual activity:  Not on file  Other Topics Concern  . Not on file  Social History Narrative  . Not on file   Current Outpatient Medications on File Prior to Visit  Medication Sig Dispense Refill  . albuterol (PROVENTIL HFA;VENTOLIN HFA) 108 (90 Base) MCG/ACT inhaler Inhale 2 puffs into the lungs every 6 (six) hours as needed for wheezing or shortness of breath. Reported on 05/01/2016 1 Inhaler 1  . azelastine (OPTIVAR) 0.05 % ophthalmic solution Place 1 drop into both eyes 2 (two) times daily as needed (for allergic eye). Reported on 05/01/2016    . CALCIUM CITRATE-VITAMIN D3 PO Take 1 tablet by mouth 3 (three) times a week.    Marland Kitchen  clindamycin (CLEOCIN T) 1 % external solution Apply 1 application topically 2 (two) times daily as needed (applied to breast, groin, and underarms as needed.).    Marland Kitchen clindamycin (CLEOCIN) 300 MG capsule Take 300 mg by mouth See admin instructions. Take 1 capsule (300 mg) daily for 2 weeks as needed HS    . Fluocinolone Acetonide Body 0.01 % OIL Apply 1 application topically as needed. Applied daily to scalp    . fluticasone (FLOVENT HFA) 110 MCG/ACT inhaler Inhale 2 puffs into the lungs every morning.     Marland Kitchen glucose blood (PRECISION QID TEST) test strip Use 1 strip  twice a day  [dx 250.02]    . ketoconazole (NIZORAL) 2 % shampoo Apply 1 application topically 3 (three) times a week. Use 2-3 times a week. Leave in for 5 minutes before rinsing out    . lamoTRIgine (LAMICTAL) 25 MG tablet Take 2 tablets (50 mg total) by mouth daily. 180 tablet 1  . levocetirizine (XYZAL) 5 MG tablet Take 1 tablet (5 mg total) by mouth every evening. Reported on 05/01/2016 30 tablet 1  . metFORMIN (GLUCOPHAGE-XR) 500 MG 24 hr tablet Take 1 tablet (500 mg total) by mouth 2 (two) times daily with a meal. 180 tablet 3  . montelukast (SINGULAIR) 10 MG tablet Take 10 mg by mouth at bedtime.    . Olopatadine HCl (PATANASE) 0.6 % SOLN Place 1 spray into the nose daily as needed (for allergies.).     Marland Kitchen pantoprazole  (PROTONIX) 40 MG tablet Take 1 tablet (40 mg total) by mouth every morning. 30 tablet 5  . rifampin (RIFADIN) 300 MG capsule Take 300 mg by mouth See admin instructions. Take 1 capsule (300 mg) by mouth daily for 2 weeks for HS    . traZODone (DESYREL) 150 MG tablet Take 1 tablet (150 mg total) by mouth at bedtime. 90 tablet 1  . triamcinolone ointment (KENALOG) 0.1 % Apply 1 application topically 2 (two) times daily as needed (applied to affected areas of scalp).    . venlafaxine XR (EFFEXOR XR) 75 MG 24 hr capsule Take 1 capsule (75 mg total) by mouth daily with breakfast. 30 capsule 1  . Vitamin D, Ergocalciferol, (DRISDOL) 50000 units CAPS capsule TAKE 1 CAPSULE BY MOUTH EVERY 7 DAYS 12 capsule 0   Current Facility-Administered Medications on File Prior to Visit  Medication Dose Route Frequency Provider Last Rate Last Dose  . polyethylene glycol (MIRALAX / GLYCOLAX) packet 17 g  17 g Oral Daily Schuman, Christanna R, MD       Allergies  Allergen Reactions  . Ibuprofen Other (See Comments)    Upset stomach   . Caffeine Other (See Comments)    Stomach issues   Family History  Problem Relation Age of Onset  . Hypertension Mother   . Diabetes Mother   . Asthma Brother   . Breast cancer Neg Hx   . Colon cancer Neg Hx     PE: BP 134/86   Pulse 85   Resp 16   Ht 5\' 1"  (1.549 m)   Wt 235 lb (106.6 kg)   SpO2 98%   BMI 44.40 kg/m  There is no height or weight on file to calculate BMI. Wt Readings from Last 3 Encounters:  08/03/18 235 lb (106.6 kg)  06/01/18 231 lb 2 oz (104.8 kg)  04/07/18 236 lb (107 kg)   Constitutional: overweight, in NAD Eyes: PERRLA, EOMI, no exophthalmos ENT: moist mucous membranes, no thyromegaly, no  cervical lymphadenopathy Cardiovascular: RRR, No MRG Respiratory: CTA B Gastrointestinal: abdomen soft, NT, ND, BS+ Musculoskeletal: no deformities, strength intact in all 4 Skin: moist, warm, +  acanthosis nigricans on neck; hirsutism face,  chest Neurological: no tremor with outstretched hands, DTR normal in all 4  ASSESSMENT: 1. DM2, non-insulin-dependent, uncontrolled, without complications - MAU  2. Obesity class III  PLAN:  1. Patient with long-standing, uncontrolled, type 2 diabetes, greatly improved after her gastric bypass surgery in 2016.  She was able to come off all of her medicines.  Her HbA1c decreased to 6.2%, however, before last visit, she did not check her sugars that she did not have a meter.  She also gained weight.  She had prednisone tapers and was on antibiotics for upper respiratory infection.  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  Therefore, sugars increase and her last HbA1c was higher, at 7.1%.  We did not restart any of her medicines at that time but strongly advised her to start checking sugars. - Since then, she had another HbA1c in 06/15/2018 and this was better, at 6.8%. - at today's visit, HbA1c is only slightly higher, at 6.9% - We reviewed her meter download at this visit.  Sugars are at, or slightly higher than target, and they were definitely higher before starting metformin.  This dose of metformin appears to be enough for her, but we discussed that if the sugars to start decreasing, she can go up on the metformin dose by 1 or even 2 tablets/day - I advised her to: Patient Instructions  Please continue: - Metformin ER 500 mg 2x a day  Please come back for a follow-up appointment in 4 months.  - continue checking sugars at different times of the day - check 1x a day, rotating checks - advised for yearly eye exams >> she is UTD - Given flu shot today - Return to clinic in 4 mo with sugar log    2.  Obesity class III -She had gastric sleeve surgery in 2016 -Weight stable since last visit -Continue metformin which should help limit long-term weight gain and decreased appetite  Carlus Pavlov, MD PhD [facility] Endocrinology

## 2018-08-04 ENCOUNTER — Encounter: Payer: Self-pay | Admitting: Internal Medicine

## 2018-08-04 ENCOUNTER — Ambulatory Visit: Payer: Managed Care, Other (non HMO) | Admitting: Internal Medicine

## 2018-08-04 DIAGNOSIS — F411 Generalized anxiety disorder: Secondary | ICD-10-CM | POA: Diagnosis not present

## 2018-08-04 DIAGNOSIS — F439 Reaction to severe stress, unspecified: Secondary | ICD-10-CM

## 2018-08-04 DIAGNOSIS — I10 Essential (primary) hypertension: Secondary | ICD-10-CM | POA: Diagnosis not present

## 2018-08-04 DIAGNOSIS — K219 Gastro-esophageal reflux disease without esophagitis: Secondary | ICD-10-CM | POA: Diagnosis not present

## 2018-08-04 DIAGNOSIS — Z9109 Other allergy status, other than to drugs and biological substances: Secondary | ICD-10-CM | POA: Diagnosis not present

## 2018-08-04 DIAGNOSIS — R140 Abdominal distension (gaseous): Secondary | ICD-10-CM

## 2018-08-04 DIAGNOSIS — F331 Major depressive disorder, recurrent, moderate: Secondary | ICD-10-CM

## 2018-08-04 DIAGNOSIS — E559 Vitamin D deficiency, unspecified: Secondary | ICD-10-CM

## 2018-08-04 NOTE — Progress Notes (Signed)
Patient ID: Shirley [name], female   DOB: [birth year], 37 y.o.   MRN: [id number]   Subjective:    Patient ID: Shirley [name], female    DOB: 24-Sep-1981, 37 y.o.   MRN: [id number]  HPI  Patient here for a scheduled follow up.  Here to f/u on her blood pressure.  She is s/p gastric bypass surgery (sleeve gastrectomy) in 08/2015.  She is planning to see her surgeon 08/10/18 for f/u.  She is having persistent intermittent issues with bloating and discomfort.  On protonix.  Takes Concha Pyo.  Flares intermittently.  No pain or bloating today.  Has discussed f/u with surgery.  Have discussed revision.  Off weight loss medication.  No chest pain.  No sob.  Bowels are moving.  Off her psych medications.  Feels better off.  Increased stress with work.  Overall she feels she is handling things relatively well.  Does not feel needs anything more at this time.     Past Medical History:  Diagnosis Date  . Anemia   . Asthma    WELL CONTROLLED  . Complication of anesthesia    -TACYCARDIA AFTER GASTRIC SURGERY IN 2016 AND HAD TO STAY 5 DAYS-itching after surgery 2005, 2006  . Depression   . Diabetes mellitus (HCC)   . Fatty liver   . GERD (gastroesophageal reflux disease)   . Heart murmur   . History of dermoid cyst excision   . History of hiatal hernia   . Hypertension    H/O  . IBS (irritable bowel syndrome)   . Migraine   . PONV (postoperative nausea and vomiting)    nausea   Past Surgical History:  Procedure Laterality Date  . CHOLECYSTECTOMY  2005  . DILATATION & CURETTAGE/HYSTEROSCOPY WITH MYOSURE N/A 03/30/2018   Procedure: DILATATION & CURETTAGE/HYSTEROSCOPY;  Surgeon: Natale Milch, MD;  Location: [facility];  Service: Gynecology;  Laterality: N/A;  . LAPAROSCOPIC GASTRIC RESTRICTIVE DUODENAL PROCEDURE (DUODENAL SWITCH) N/A 08/28/2015   Procedure: LAPAROSCOPIC GASTRIC RESTRICTIVE DUODENAL PROCEDURE (DUODENAL SWITCH);  Surgeon: [name], MD;  Location: [facility];  Service: General;   Laterality: N/A;  . 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 Hypertension Mother   . Diabetes Mother   . Asthma Brother   . Breast cancer Neg Hx   . Colon cancer Neg Hx    Social History   Socioeconomic History  . Marital status: Single    Spouse name: Not on file  . Number of children: 0  . Years of education: Not on file  . Highest education level: Not on file  Occupational History    Employer: [employer]  Social Needs  . Financial resource strain: Not on file  . Food insecurity:    Worry: Not on file    Inability: Not on file  . Transportation needs:    Medical: Not on file    Non-medical: Not on file  Tobacco Use  . Smoking status: Never Smoker  . Smokeless tobacco: Never Used  Substance and Sexual Activity  . Alcohol use: Yes    Alcohol/week: 0.0 standard drinks    Comment: social  . Drug use: No  . Sexual activity: Not Currently    Birth control/protection: None  Lifestyle  . Physical activity:    Days per week: 1 day    Minutes per session: 30 min  . Stress: Very much  Relationships  . Social connections:  Talks on phone: Not on file    Gets together: Not on file    Attends religious service: Not on file    Active member of club or organization: Not on file    Attends meetings of clubs or organizations: Not on file    Relationship status: Not on file  Other Topics Concern  . Not on file  Social History Narrative  . Not on file    Outpatient Encounter Medications as of 08/04/2018  Medication Sig  . albuterol (PROVENTIL HFA;VENTOLIN HFA) 108 (90 Base) MCG/ACT inhaler Inhale 2 puffs into the lungs every 6 (six) hours as needed for wheezing or shortness of breath. Reported on 05/01/2016  . azelastine (OPTIVAR) 0.05 % ophthalmic solution Place 1 drop into both eyes 2 (two) times daily as needed (for allergic eye). Reported on 05/01/2016  . CALCIUM CITRATE-VITAMIN D3 PO Take 1 tablet by mouth 3 (three) times a  week.  . clindamycin (CLEOCIN T) 1 % external solution Apply 1 application topically 2 (two) times daily as needed (applied to breast, groin, and underarms as needed.).  Marland Kitchen clindamycin (CLEOCIN) 300 MG capsule Take 300 mg by mouth See admin instructions. Take 1 capsule (300 mg) daily for 2 weeks as needed HS  . Fluocinolone Acetonide Body 0.01 % OIL Apply 1 application topically as needed. Applied daily to scalp  . fluticasone (FLOVENT HFA) 110 MCG/ACT inhaler Inhale 2 puffs into the lungs every morning.   Marland Kitchen glucose blood (PRECISION QID TEST) test strip Use 1 strip  twice a day  [dx 250.02]  . ketoconazole (NIZORAL) 2 % shampoo Apply 1 application topically 3 (three) times a week. Use 2-3 times a week. Leave in for 5 minutes before rinsing out  . levocetirizine (XYZAL) 5 MG tablet Take 1 tablet (5 mg total) by mouth every evening. Reported on 05/01/2016  . metFORMIN (GLUCOPHAGE-XR) 500 MG 24 hr tablet Take 1 tablet (500 mg total) by mouth 2 (two) times daily with a meal.  . montelukast (SINGULAIR) 10 MG tablet Take 10 mg by mouth at bedtime.  . Olopatadine HCl (PATANASE) 0.6 % SOLN Place 1 spray into the nose daily as needed (for allergies.).   Marland Kitchen pantoprazole (PROTONIX) 40 MG tablet Take 1 tablet (40 mg total) by mouth every morning.  . rifampin (RIFADIN) 300 MG capsule Take 300 mg by mouth See admin instructions. Take 1 capsule (300 mg) by mouth daily for 2 weeks for HS  . traZODone (DESYREL) 150 MG tablet Take 1 tablet (150 mg total) by mouth at bedtime.  . triamcinolone ointment (KENALOG) 0.1 % Apply 1 application topically 2 (two) times daily as needed (applied to affected areas of scalp).  . [DISCONTINUED] lamoTRIgine (LAMICTAL) 25 MG tablet Take 2 tablets (50 mg total) by mouth daily.  . [DISCONTINUED] venlafaxine XR (EFFEXOR XR) 75 MG 24 hr capsule Take 1 capsule (75 mg total) by mouth daily with breakfast.  . [DISCONTINUED] Vitamin D, Ergocalciferol, (DRISDOL) 50000 units CAPS capsule TAKE 1  CAPSULE BY MOUTH EVERY 7 DAYS   Facility-Administered Encounter Medications as of 08/04/2018  Medication  . polyethylene glycol (MIRALAX / GLYCOLAX) packet 17 g    Review of Systems  Constitutional: Negative for appetite change.       Has gained weight.    HENT: Negative for congestion and sinus pressure.   Respiratory: Negative for cough, chest tightness and shortness of breath.   Cardiovascular: Negative for chest pain, palpitations and leg swelling.  Gastrointestinal: Negative for diarrhea and  vomiting.       Intermittent abdominal bloating and discomfort as outlined.    Genitourinary: Negative for difficulty urinating and dysuria.  Musculoskeletal: Negative for joint swelling and myalgias.  Skin: Negative for color change and rash.  Neurological: Negative for dizziness, light-headedness and headaches.  Psychiatric/Behavioral: Negative for agitation and dysphoric mood.       Objective:    Physical Exam  Constitutional: She appears well-developed and well-nourished. No distress.  HENT:  Nose: Nose normal.  Mouth/Throat: Oropharynx is clear and moist.  Neck: Neck supple. No thyromegaly present.  Cardiovascular: Normal rate and regular rhythm.  Pulmonary/Chest: Breath sounds normal. No respiratory distress. She has no wheezes.  Abdominal: Soft. Bowel sounds are normal. There is no tenderness.  Musculoskeletal: She exhibits no edema or tenderness.  Lymphadenopathy:    She has no cervical adenopathy.  Skin: No rash noted. No erythema.  Psychiatric: She has a normal mood and affect. Her behavior is normal.    BP 138/88 (BP Location: Left Arm, Patient Position: Sitting, Cuff Size: Normal)   Pulse 74   Temp 98.7 F (37.1 C) (Oral)   Resp 18   Wt 236 lb 12.8 oz (107.4 kg)   SpO2 99%   BMI 44.74 kg/m  Wt Readings from Last 3 Encounters:  08/04/18 236 lb 12.8 oz (107.4 kg)  08/03/18 235 lb (106.6 kg)  06/01/18 231 lb 2 oz (104.8 kg)     Lab Results  Component Value  Date   WBC 6.9 03/30/2018   HGB 12.3 03/30/2018   HCT 38.7 03/30/2018   PLT 228 03/30/2018   GLUCOSE 112 (H) 06/15/2018   CHOL 143 06/15/2018   TRIG 91 06/15/2018   HDL 62 06/15/2018   LDLCALC 63 06/15/2018   ALT 14 06/15/2018   AST 15 06/15/2018   NA 139 06/15/2018   K 4.4 06/15/2018   CL 101 06/15/2018   CREATININE 0.79 06/15/2018   BUN 9 06/15/2018   CO2 22 06/15/2018   TSH 1.420 06/15/2018   HGBA1C 6.9 (A) 08/03/2018       Assessment & Plan:   Problem List Items Addressed This Visit    Bloating    Increased bloating and abdominal discomfort as outlined.  Persistent intermittent issue.  No pain today.  Plans to see surgery to discuss treatment options.        Environmental allergies    Followed by Dr Morenci Callas.  Stable on current regimen.        Generalized anxiety disorder    Has been followed by psychiatry.  Off medication now (except for trazodone).  Feels she is doing well.  Desires no further medication.  Follow.        GERD (gastroesophageal reflux disease)    On protonix.        Hypertension    Blood pressure as outlined.  Follow pressures.  Follow metabolic panel.  If persistent elevation will have to add medication.       Major depressive disorder, recurrent episode, moderate (HCC)    Has been followed by psychiatry.  Off medication now except for trazodone.  Overall she feels she is doing well.  Follow.        Severe obesity (BMI >= 40) (HCC)    Planning to follow with her surgeon given persistent problems.  Increased weight.  Discussed diet and exercise.        Stress    Overall handling stress relatively well.  Follow.  Vitamin D deficiency    Follow vitamin D level.            Dale Durhamharlene Kameo Bains, MD

## 2018-08-07 ENCOUNTER — Encounter: Payer: Self-pay | Admitting: Internal Medicine

## 2018-08-07 DIAGNOSIS — R140 Abdominal distension (gaseous): Secondary | ICD-10-CM | POA: Insufficient documentation

## 2018-08-07 NOTE — Assessment & Plan Note (Signed)
Has been followed by psychiatry.  Off medication now except for trazodone.  Overall she feels she is doing well.  Follow.

## 2018-08-07 NOTE — Assessment & Plan Note (Signed)
Follow vitamin D level.  

## 2018-08-07 NOTE — Assessment & Plan Note (Signed)
On protonix

## 2018-08-07 NOTE — Assessment & Plan Note (Signed)
Blood pressure as outlined.  Follow pressures.  Follow metabolic panel.  If persistent elevation will have to add medication.

## 2018-08-07 NOTE — Assessment & Plan Note (Signed)
Followed by Dr Sharma.  Stable on current regimen.   

## 2018-08-07 NOTE — Assessment & Plan Note (Signed)
Overall handling stress relatively well.  Follow.  

## 2018-08-07 NOTE — Assessment & Plan Note (Signed)
Has been followed by psychiatry.  Off medication now (except for trazodone).  Feels she is doing well.  Desires no further medication.  Follow.

## 2018-08-07 NOTE — Assessment & Plan Note (Signed)
Increased bloating and abdominal discomfort as outlined.  Persistent intermittent issue.  No pain today.  Plans to see surgery to discuss treatment options.

## 2018-08-07 NOTE — Assessment & Plan Note (Signed)
Planning to follow with her surgeon given persistent problems.  Increased weight.  Discussed diet and exercise.

## 2018-08-30 ENCOUNTER — Ambulatory Visit: Payer: 59 | Admitting: Psychiatry

## 2018-10-08 ENCOUNTER — Ambulatory Visit: Payer: Managed Care, Other (non HMO) | Admitting: Internal Medicine

## 2018-10-08 ENCOUNTER — Encounter: Payer: Self-pay | Admitting: Internal Medicine

## 2018-10-08 VITALS — BP 130/74 | HR 78 | Temp 98.4°F | Resp 18 | Wt 246.0 lb

## 2018-10-08 DIAGNOSIS — R059 Cough, unspecified: Secondary | ICD-10-CM

## 2018-10-08 DIAGNOSIS — E119 Type 2 diabetes mellitus without complications: Secondary | ICD-10-CM

## 2018-10-08 DIAGNOSIS — Z6841 Body Mass Index (BMI) 40.0 and over, adult: Secondary | ICD-10-CM | POA: Diagnosis not present

## 2018-10-08 DIAGNOSIS — R140 Abdominal distension (gaseous): Secondary | ICD-10-CM | POA: Diagnosis not present

## 2018-10-08 DIAGNOSIS — K219 Gastro-esophageal reflux disease without esophagitis: Secondary | ICD-10-CM

## 2018-10-08 DIAGNOSIS — F439 Reaction to severe stress, unspecified: Secondary | ICD-10-CM

## 2018-10-08 DIAGNOSIS — R718 Other abnormality of red blood cells: Secondary | ICD-10-CM | POA: Diagnosis not present

## 2018-10-08 DIAGNOSIS — F411 Generalized anxiety disorder: Secondary | ICD-10-CM

## 2018-10-08 DIAGNOSIS — I10 Essential (primary) hypertension: Secondary | ICD-10-CM

## 2018-10-08 DIAGNOSIS — R05 Cough: Secondary | ICD-10-CM | POA: Diagnosis not present

## 2018-10-08 DIAGNOSIS — E559 Vitamin D deficiency, unspecified: Secondary | ICD-10-CM

## 2018-10-08 MED ORDER — OLOPATADINE HCL 0.6 % NA SOLN: 1.0000 | Freq: Every day | NASAL | 1 refills | Status: DC | PRN

## 2018-10-08 NOTE — Progress Notes (Signed)
Patient ID: Shirley [name], female   DOB: [birth year], 37 y.o.   MRN: [id number]   Subjective:    Patient ID: Shirley [name], female    DOB: 12/08/1980, 37 y.o.   MRN: [id number]  HPI  Patient here for a scheduled follow up.  She has been having issues with acid reflux, bloating and abdominal discomfort.  See previous note.  Saw her bariatric surgeon.  Note reviewed.  Taking protonix daily.  Indigestion does not seem to be an issue.  They discussed revision - surgery.  She is eating.  Symptoms fluctuate.  States two weeks ago, she was outside.  After this, she developed increased cough and congestion. Previous sore throat, but his resolved.  Increased drainage and nasal congestion.  Using humidifier.  Some chest congestion.  Some productive yellow mucus.  No chest pain.  No sob.  Bowels moving.  Fasting sugars - 120s.  Remainder of sugars 140-160s.     Past Medical History:  Diagnosis Date  . Anemia   . Asthma    WELL CONTROLLED  . Complication of anesthesia    -TACYCARDIA AFTER GASTRIC SURGERY IN 2016 AND HAD TO STAY 5 DAYS-itching after surgery 2005, 2006  . Depression   . Diabetes mellitus ([place])   . Fatty liver   . GERD (gastroesophageal reflux disease)   . Heart murmur   . History of dermoid cyst excision   . History of hiatal hernia   . Hypertension    H/O  . IBS (irritable bowel syndrome)   . Migraine   . PONV (postoperative nausea and vomiting)    nausea   Past Surgical History:  Procedure Laterality Date  . CHOLECYSTECTOMY  2005  . DILATATION & CURETTAGE/HYSTEROSCOPY WITH MYOSURE N/A 03/30/2018   Procedure: DILATATION & CURETTAGE/HYSTEROSCOPY;  Surgeon: Homero Fellers, MD;  Location: [facility];  Service: Gynecology;  Laterality: N/A;  . LAPAROSCOPIC GASTRIC RESTRICTIVE DUODENAL PROCEDURE (DUODENAL SWITCH) N/A 08/28/2015   Procedure: LAPAROSCOPIC GASTRIC RESTRICTIVE DUODENAL PROCEDURE (DUODENAL SWITCH);  Surgeon: Ladora Daniel, MD;  Location: [facility];  Service:  General;  Laterality: N/A;  . 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 Hypertension Mother   . Diabetes Mother   . Asthma Brother   . Breast cancer Neg Hx   . Colon cancer Neg Hx    Social History   Socioeconomic History  . Marital status: Single    Spouse name: Not on file  . Number of children: 0  . Years of education: Not on file  . Highest education level: Not on file  Occupational History    Employer: [employer]  . Financial resource strain: Not on file  . Food insecurity:    Worry: Not on file    Inability: Not on file  . Transportation needs:    Medical: Not on file    Non-medical: Not on file  Tobacco Use  . Smoking status: Never Smoker  . Smokeless tobacco: Never Used  Substance and Sexual Activity  . Alcohol use: Yes    Alcohol/week: 0.0 standard drinks    Comment: social  . Drug use: No  . Sexual activity: Not Currently    Birth control/protection: None  Lifestyle  . Physical activity:    Days per week: 1 day    Minutes per session: 30 min  . Stress: Very much  Relationships  . Social connections:    Talks on  phone: Not on file    Gets together: Not on file    Attends religious service: Not on file    Active member of club or organization: Not on file    Attends meetings of clubs or organizations: Not on file    Relationship status: Not on file  Other Topics Concern  . Not on file  Social History Narrative  . Not on file    Outpatient Encounter Medications as of 10/08/2018  Medication Sig  . albuterol (PROVENTIL HFA;VENTOLIN HFA) 108 (90 Base) MCG/ACT inhaler Inhale 2 puffs into the lungs every 6 (six) hours as needed for wheezing or shortness of breath. Reported on 05/01/2016  . azelastine (OPTIVAR) 0.05 % ophthalmic solution Place 1 drop into both eyes 2 (two) times daily as needed (for allergic eye). Reported on 05/01/2016  . CALCIUM CITRATE-VITAMIN D3 PO Take 1 tablet by mouth 3 (three)  times a week.  . citalopram (CELEXA) 20 MG tablet   . clindamycin (CLEOCIN T) 1 % external solution Apply 1 application topically 2 (two) times daily as needed (applied to breast, groin, and underarms as needed.).  Marland Kitchen clindamycin (CLEOCIN) 300 MG capsule Take 300 mg by mouth See admin instructions. Take 1 capsule (300 mg) daily for 2 weeks as needed HS  . Fluocinolone Acetonide Body 0.01 % OIL Apply 1 application topically as needed. Applied daily to scalp  . fluticasone (FLOVENT HFA) 110 MCG/ACT inhaler Inhale 2 puffs into the lungs every morning.   Marland Kitchen glucose blood (PRECISION QID TEST) test strip Use 1 strip  twice a day  [dx 250.02]  . ketoconazole (NIZORAL) 2 % shampoo Apply 1 application topically 3 (three) times a week. Use 2-3 times a week. Leave in for 5 minutes before rinsing out  . levocetirizine (XYZAL) 5 MG tablet Take 1 tablet (5 mg total) by mouth every evening. Reported on 05/01/2016  . metFORMIN (GLUCOPHAGE-XR) 500 MG 24 hr tablet Take 1 tablet (500 mg total) by mouth 2 (two) times daily with a meal.  . montelukast (SINGULAIR) 10 MG tablet Take 10 mg by mouth at bedtime.  . Olopatadine HCl (PATANASE) 0.6 % SOLN Place 1 spray into the nose daily as needed (for allergies.).  Marland Kitchen pantoprazole (PROTONIX) 40 MG tablet Take 1 tablet (40 mg total) by mouth every morning.  . rifampin (RIFADIN) 300 MG capsule Take 300 mg by mouth See admin instructions. Take 1 capsule (300 mg) by mouth daily for 2 weeks for HS  . traZODone (DESYREL) 150 MG tablet Take 1 tablet (150 mg total) by mouth at bedtime.  . triamcinolone ointment (KENALOG) 0.1 % Apply 1 application topically 2 (two) times daily as needed (applied to affected areas of scalp).  . [DISCONTINUED] Olopatadine HCl (PATANASE) 0.6 % SOLN Place 1 spray into the nose daily as needed (for allergies.).    Facility-Administered Encounter Medications as of 10/08/2018  Medication  . polyethylene glycol (MIRALAX / GLYCOLAX) packet 17 g    Review of  Systems  Constitutional: Negative for fever.       Has gained weight.    HENT: Positive for congestion and postnasal drip.        Previous sore throat.   Respiratory: Positive for cough. Negative for chest tightness and shortness of breath.   Cardiovascular: Negative for chest pain, palpitations and leg swelling.  Gastrointestinal: Negative for vomiting.       Intermittent abdominal bloating and discomfort.  Indigestion does not appear to be an issue.  Genitourinary: Negative for difficulty urinating and dysuria.  Musculoskeletal: Negative for joint swelling and myalgias.  Skin: Negative for color change and rash.  Neurological: Negative for dizziness, light-headedness and headaches.  Psychiatric/Behavioral: Negative for agitation and dysphoric mood.       Objective:    Physical Exam  Constitutional: She appears well-developed and well-nourished. No distress.  HENT:  Nose: Nose normal.  Mouth/Throat: Oropharynx is clear and moist.  Neck: Neck supple. No thyromegaly present.  Cardiovascular: Normal rate and regular rhythm.  Pulmonary/Chest: Breath sounds normal. No respiratory distress. She has no wheezes.  Abdominal: Soft. Bowel sounds are normal. There is no tenderness.  Musculoskeletal: She exhibits no edema or tenderness.  Lymphadenopathy:    She has no cervical adenopathy.  Skin: No rash noted. No erythema.  Psychiatric: She has a normal mood and affect. Her behavior is normal.    BP 130/74 (BP Location: Left Arm, Patient Position: Sitting, Cuff Size: Large)   Pulse 78   Temp 98.4 F (36.9 C) (Oral)   Resp 18   Wt 246 lb (111.6 kg)   SpO2 98%   BMI 46.48 kg/m  Wt Readings from Last 3 Encounters:  10/08/18 246 lb (111.6 kg)  08/04/18 236 lb 12.8 oz (107.4 kg)  08/03/18 235 lb (106.6 kg)     Lab Results  Component Value Date   WBC 6.9 03/30/2018   HGB 12.3 03/30/2018   HCT 38.7 03/30/2018   PLT 228 03/30/2018   GLUCOSE 112 (H) 06/15/2018   CHOL 143  06/15/2018   TRIG 91 06/15/2018   HDL 62 06/15/2018   LDLCALC 63 06/15/2018   ALT 14 06/15/2018   AST 15 06/15/2018   NA 139 06/15/2018   K 4.4 06/15/2018   CL 101 06/15/2018   CREATININE 0.79 06/15/2018   BUN 9 06/15/2018   CO2 22 06/15/2018   TSH 1.420 06/15/2018   HGBA1C 6.9 (A) 08/03/2018       Assessment & Plan:   Problem List Items Addressed This Visit    Bloating    Persistent intermittent issue.  Has d/w her bariatric surgeon. Considering a revision.  On protonix.  Stable currently.  Follow.        BMI 45.0-49.9, adult (HCC)    Discussed diet, exercise and weight loss.        Cough    Increased cough and congestion as outlined.  flovent - use bid.  Continue singulair.  Refilled the steroid nasal spray.  Robitussin if needed.  Follow.  Hold abx.  Call with update.        Diabetes mellitus without complication (HCC)    Low carb diet and exercise.  Has gained some weight.  Follow met b and a1c.        Relevant Orders   Hemoglobin A1c   Hepatic function panel   Lipid panel   Generalized anxiety disorder    Doing better.  Taking trazodone.  Follow.        Relevant Medications   citalopram (CELEXA) 20 MG tablet   GERD (gastroesophageal reflux disease)    On protonix.       Hypertension    Blood pressure under good control.  Continue same medication regimen.  Follow pressures.  Follow metabolic panel.        Relevant Orders   CBC with Differential/Platelet   Basic metabolic panel   Stress    Overall handling stress well.  Follow.        Vitamin D  deficiency    Follow vitamin D level.         Other Visit Diagnoses    Microcytosis    -  Primary   Relevant Orders   Ferritin   Vitamin B12       Einar Pheasant, MD

## 2018-10-08 NOTE — Patient Instructions (Signed)
Have labs drawn at [facility] in the next 2 months.

## 2018-10-17 DIAGNOSIS — E119 Type 2 diabetes mellitus without complications: Secondary | ICD-10-CM | POA: Insufficient documentation

## 2018-10-17 NOTE — Assessment & Plan Note (Signed)
Overall handling stress well.  Follow.  

## 2018-10-17 NOTE — Assessment & Plan Note (Signed)
Blood pressure under good control.  Continue same medication regimen.  Follow pressures.  Follow metabolic panel.   

## 2018-10-17 NOTE — Assessment & Plan Note (Signed)
Low carb diet and exercise.  Has gained some weight.  Follow met b and a1c.

## 2018-10-17 NOTE — Assessment & Plan Note (Signed)
Doing better.  Taking trazodone.  Follow.

## 2018-10-17 NOTE — Assessment & Plan Note (Signed)
Increased cough and congestion as outlined.  flovent - use bid.  Continue singulair.  Refilled the steroid nasal spray.  Robitussin if needed.  Follow.  Hold abx.  Call with update.

## 2018-10-17 NOTE — Assessment & Plan Note (Signed)
Persistent intermittent issue.  Has d/w her bariatric surgeon. Considering a revision.  On protonix.  Stable currently.  Follow.

## 2018-10-17 NOTE — Assessment & Plan Note (Signed)
On protonix

## 2018-10-17 NOTE — Assessment & Plan Note (Signed)
Follow vitamin D level.  

## 2018-10-17 NOTE — Assessment & Plan Note (Signed)
Discussed diet, exercise and weight loss

## 2018-12-06 ENCOUNTER — Ambulatory Visit: Payer: Managed Care, Other (non HMO) | Admitting: Internal Medicine

## 2018-12-06 ENCOUNTER — Encounter: Payer: Self-pay | Admitting: Internal Medicine

## 2018-12-06 VITALS — BP 118/62 | HR 76 | Ht 61.0 in | Wt 233.0 lb

## 2018-12-06 DIAGNOSIS — E119 Type 2 diabetes mellitus without complications: Secondary | ICD-10-CM

## 2018-12-06 LAB — POCT GLYCOSYLATED HEMOGLOBIN (HGB A1C): Hemoglobin A1C: 6.5 % — AB (ref 4.0–5.6)

## 2018-12-06 NOTE — Patient Instructions (Signed)
Please continue: - Metformin ER 500 mg 2x a day  Please come back for a follow-up appointment in 4 months.

## 2018-12-06 NOTE — Addendum Note (Signed)
Addended by: Darliss Ridgel I on: 12/06/2018 04:10 PM   Modules accepted: Orders

## 2018-12-06 NOTE — Progress Notes (Signed)
Patient ID: Shirley [name], female   DOB: 1981-03-10, 38 y.o.   MRN: [id number]  HPI: Shirley DomRaytarsha M Betsill is a 38 y.o.-year-old female, returning for f/u for DM2, dx 2005, non-insulin-dependent, uncontrolled, with complications ([facility]). Last visit 4 months ago.  She has a history of gastric bypass surgery (sleeve gastrectomy) in 08/2015.  She did have complications after the surgery: Hypoglycemia and hypotension, both resolved.  Her diabetes dramatically improved after the surgery (she also lost ~50-60 pounds) and she could come off all of the medicines.  Since then, sugars worsened and we restarted metformin ER in 02/2018.  In the past, she was describing GERD and abdominal pain.  She saw her bariatric surgeon and the barium swallow showed abnormal stomach architecture.  She was told she may need a revision surgery.  However, she was started on acid reflux medicines and her symptoms improved significantly.  She started the [employer] at [place] at the beginning of the year >> lost 13 lbs.  Last hemoglobin A1c was: Lab Results  Component Value Date   HGBA1C 6.9 (A) 08/03/2018   HGBA1C 6.8 (H) 06/15/2018   HGBA1C 7.1 01/07/2018  07/15/2015: 7.1% 07/13/2014: 7.2%  - at work 01/2014: 7.8% 08/2013: 7.2%  She is now on: - Metformin ER 500 mg twice a day  Previous regimen: - Januvia 100 mg in am - Metformin XR (osm) 1000 mg tabs 2x a day with meals >> some loose stools - Invokana 100 - added 03/2014 She was on Janumet XR 1000-100 mg po with dinner (used to cut them in half to swallow them better!!!) She was on Metformin in the past >> diarrhea at max dose  She checks her sugars 0-1x day - am:  90-100 >> 90s >> 85-99 >> 122-130, 147  >> 119, 135, 143 - 2h after b'fast: 120s >> 110-120s, 145 >> 149 >> n/c - before lunch: 98-120 >> 104-149, 161 >> n/c >> 109 - 2h after lunch: 87, 100-121, 146 >> 130 >> 144, 191, 220 - before dinner: 131-162 >> 100-136 >> n/c - 2h after dinner: 147-180:ABx and  Pred. >>120s >> n/c - bedtime: n/c - nighttime: n/c Lowest sugar was 90 >> 85 >> 100 >> 109; it is unclear at which level she has hypoglycemia awareness. Highest sugar was 145 >> 247 >> 200 >> 220 (3 bananas).  -No CKD, last BUN/creatinine: Lab Results  Component Value Date   BUN 9 06/15/2018   CREATININE 0.79 06/15/2018  Off losartan.  Normal ACR Lab Results  Component Value Date   MICRALBCREAT 11.2 07/31/2016   MICRALBCREAT 12.1 08/10/2015   -No HL Lab Results  Component Value Date   CHOL 143 06/15/2018   HDL 62 06/15/2018   LDLCALC 63 06/15/2018   TRIG 91 06/15/2018   CHOLHDL 2.3 06/15/2018  Not on a statin - last eye exam was in 11/2017: No DR -no numbness and tingling in her feet.  She also has a history of vitamin D deficiency (on vitamin 5000 units), HTN, GERD.   ROS: Constitutional: no weight gain/no weight loss, no fatigue, no subjective hyperthermia, no subjective hypothermia Eyes: no blurry vision, no xerophthalmia ENT: no sore throat, no nodules palpated in neck, no dysphagia, no odynophagia, no hoarseness Cardiovascular: no CP/no SOB/no palpitations/no leg swelling Respiratory: no cough/no SOB/no wheezing Gastrointestinal: no N/no V/no D/no C/no acid reflux Musculoskeletal: no muscle aches/no joint aches Skin: no rashes, no hair loss Neurological: no tremors/no numbness/no tingling/no dizziness  I reviewed pt's medications, allergies,  PMH, social hx, family hx, and changes were documented in the history of present illness. Otherwise, unchanged from my initial visit note.  Past Medical History:  Diagnosis Date  . Anemia   . Asthma    WELL CONTROLLED  . Complication of anesthesia    -TACYCARDIA AFTER GASTRIC SURGERY IN 2016 AND HAD TO STAY 5 DAYS-itching after surgery 2005, 2006  . Depression   . Diabetes mellitus ([facility])   . Fatty liver   . GERD (gastroesophageal reflux disease)   . Heart murmur   . History of dermoid cyst excision   . History of  hiatal hernia   . Hypertension    H/O  . IBS (irritable bowel syndrome)   . Migraine   . PONV (postoperative nausea and vomiting)    nausea   Past Surgical History:  Procedure Laterality Date  . CHOLECYSTECTOMY  2005  . DILATATION & CURETTAGE/HYSTEROSCOPY WITH MYOSURE N/A 03/30/2018   Procedure: DILATATION & CURETTAGE/HYSTEROSCOPY;  Surgeon: Natale Milch, MD;  Location: [facility] ORS;  Service: Gynecology;  Laterality: N/A;  . LAPAROSCOPIC GASTRIC RESTRICTIVE DUODENAL PROCEDURE (DUODENAL SWITCH) N/A 08/28/2015   Procedure: LAPAROSCOPIC GASTRIC RESTRICTIVE DUODENAL PROCEDURE (DUODENAL SWITCH);  Surgeon: [name], MD;  Location: [facility] ORS;  Service: General;  Laterality: N/A;  . 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 Marital status: Single    Spouse name: Not on file  . Number of children: 0  . Years of education: Not on file  . Highest education level: Not on file  Occupational History    Employer: [employer]  Social Needs  . Financial resource strain: Not on file  . Food insecurity:    Worry: Not on file    Inability: Not on file  . Transportation needs:    Medical: Not on file    Non-medical: Not on file  Tobacco Use  . Smoking status: Never Smoker  . Smokeless tobacco: Never Used  Substance and Sexual Activity  . Alcohol use: Yes    Alcohol/week: 0.0 standard drinks    Comment: social  . Drug use: No  . Sexual activity: Not Currently    Birth control/protection: None  Lifestyle  . Physical activity:    Days per week: 1 day    Minutes per session: 30 min  . Stress: Very much  Relationships  . Social connections:    Talks on phone: Not on file    Gets together: Not on file    Attends religious service: Not on file    Active member of club or organization: Not on file    Attends meetings of clubs or organizations: Not on file    Relationship status: Not on file  . Intimate partner violence:    Fear of current or  ex partner: Not on file    Emotionally abused: Not on file    Physically abused: Not on file    Forced sexual activity: Not on file  Other Topics Concern  . Not on file  Social History Narrative  . Not on file   Current Outpatient Medications on File Prior to Visit  Medication Sig Dispense Refill  . albuterol (PROVENTIL HFA;VENTOLIN HFA) 108 (90 Base) MCG/ACT inhaler Inhale 2 puffs into the lungs every 6 (six) hours as needed for wheezing or shortness of breath. Reported on 05/01/2016 1 Inhaler 1  . azelastine (OPTIVAR) 0.05 % ophthalmic solution Place 1 drop into both eyes 2 (two) times  daily as needed (for allergic eye). Reported on 05/01/2016    . CALCIUM CITRATE-VITAMIN D3 PO Take 1 tablet by mouth 3 (three) times a week.    . citalopram (CELEXA) 20 MG tablet     . clindamycin (CLEOCIN T) 1 % external solution Apply 1 application topically 2 (two) times daily as needed (applied to breast, groin, and underarms as needed.).    Marland Kitchen clindamycin (CLEOCIN) 300 MG capsule Take 300 mg by mouth See admin instructions. Take 1 capsule (300 mg) daily for 2 weeks as needed HS    . Fluocinolone Acetonide Body 0.01 % OIL Apply 1 application topically as needed. Applied daily to scalp    . fluticasone (FLOVENT HFA) 110 MCG/ACT inhaler Inhale 2 puffs into the lungs every morning.     Marland Kitchen glucose blood (PRECISION QID TEST) test strip Use 1 strip  twice a day  [dx 250.02]    . ketoconazole (NIZORAL) 2 % shampoo Apply 1 application topically 3 (three) times a week. Use 2-3 times a week. Leave in for 5 minutes before rinsing out    . levocetirizine (XYZAL) 5 MG tablet Take 1 tablet (5 mg total) by mouth every evening. Reported on 05/01/2016 30 tablet 1  . metFORMIN (GLUCOPHAGE-XR) 500 MG 24 hr tablet Take 1 tablet (500 mg total) by mouth 2 (two) times daily with a meal. 180 tablet 3  . montelukast (SINGULAIR) 10 MG tablet Take 10 mg by mouth at bedtime.    . Olopatadine HCl (PATANASE) 0.6 % SOLN Place 1 spray into the  nose daily as needed (for allergies.). 1 Bottle 1  . pantoprazole (PROTONIX) 40 MG tablet Take 1 tablet (40 mg total) by mouth every morning. 30 tablet 5  . rifampin (RIFADIN) 300 MG capsule Take 300 mg by mouth See admin instructions. Take 1 capsule (300 mg) by mouth daily for 2 weeks for HS    . traZODone (DESYREL) 150 MG tablet Take 1 tablet (150 mg total) by mouth at bedtime. 90 tablet 1  . triamcinolone ointment (KENALOG) 0.1 % Apply 1 application topically 2 (two) times daily as needed (applied to affected areas of scalp).     Current Facility-Administered Medications on File Prior to Visit  Medication Dose Route Frequency Provider Last Rate Last Dose  . polyethylene glycol (MIRALAX / GLYCOLAX) packet 17 g  17 g Oral Daily Schuman, Christanna R, MD       Allergies  Allergen Reactions  . Ibuprofen Other (See Comments)    Upset stomach   . Caffeine Other (See Comments)    Stomach issues   Family History  Problem Relation Age of Onset  . Hypertension Mother   . Diabetes Mother   . Asthma Brother   . Breast cancer Neg Hx   . Colon cancer Neg Hx     PE: BP 118/62   Pulse 76   Ht 5\' 1"  (1.549 m) Comment: measured  Wt 233 lb (105.7 kg)   SpO2 97%   BMI 44.02 kg/m  Body mass index is 44.02 kg/m. Wt Readings from Last 3 Encounters:  12/06/18 233 lb (105.7 kg)  10/08/18 246 lb (111.6 kg)  08/04/18 236 lb 12.8 oz (107.4 kg)   Constitutional: overweight, in NAD Eyes: PERRLA, EOMI, no exophthalmos ENT: moist mucous membranes, no thyromegaly, no cervical lymphadenopathy Cardiovascular: RRR, No MRG Respiratory: CTA B Gastrointestinal: abdomen soft, NT, ND, BS+ Musculoskeletal: no deformities, strength intact in all 4 Skin: moist, warm,  +  acanthosis nigricans on  neck; hirsutism face, chest Neurological: no tremor with outstretched hands, DTR normal in all 4  ASSESSMENT: 1. DM2, non-insulin-dependent, uncontrolled, without complications - [facility]  2. Obesity class III  3.  Abdominal pain  PLAN:  1. Patient with longstanding, uncontrolled, type 2 diabetes, greatly improved after her gastric bypass surgery in 2016.  She was able to come off all of her medicines.  Initially her HbA1c increased, but then it started to increase, after which we started back on metformin. -At last visit, HbA1c was 6.9% at that time, sugars were at or slightly higher than target but improved - at this visit >> sugars are at goal except for hyperglycemic spikes with fruit - we discussed about her diet >> encouraged her to continue (plant-based diet) - I advised her to: Patient Instructions  Please continue: - Metformin ER 500 mg 2x a day  Please come back for a follow-up appointment in 4 months.  - today, HbA1c is 6.5% (better) - continue checking sugars at different times of the day - check 1x a day, rotating checks - advised for yearly eye exams >> she is UTD - Return to clinic in 4 mo with sugar log     2.  Obesity class III -Lost net 3 pounds since last visit, but 13 pounds since November -She had gastric sleeve surgery in 2016 -Continue metformin which should help limit long-term weight gain and decreased appetite  3. Abdominal pain - still having occasional pains - continues PPI - also, will stay on the Metformin ER formulation, which is better tolerated  Carlus Pavlovristina Victorino Fatzinger, MD PhD [facility]

## 2019-01-03 LAB — HM DIABETES EYE EXAM

## 2019-01-13 ENCOUNTER — Encounter: Payer: Self-pay | Admitting: Internal Medicine

## 2019-01-13 ENCOUNTER — Ambulatory Visit: Payer: Managed Care, Other (non HMO) | Admitting: Internal Medicine

## 2019-01-13 DIAGNOSIS — E119 Type 2 diabetes mellitus without complications: Secondary | ICD-10-CM

## 2019-01-13 DIAGNOSIS — Z9109 Other allergy status, other than to drugs and biological substances: Secondary | ICD-10-CM

## 2019-01-13 DIAGNOSIS — R519 Headache, unspecified: Secondary | ICD-10-CM

## 2019-01-13 DIAGNOSIS — F331 Major depressive disorder, recurrent, moderate: Secondary | ICD-10-CM

## 2019-01-13 DIAGNOSIS — K219 Gastro-esophageal reflux disease without esophagitis: Secondary | ICD-10-CM | POA: Diagnosis not present

## 2019-01-13 DIAGNOSIS — B029 Zoster without complications: Secondary | ICD-10-CM

## 2019-01-13 DIAGNOSIS — I10 Essential (primary) hypertension: Secondary | ICD-10-CM

## 2019-01-13 DIAGNOSIS — R51 Headache: Secondary | ICD-10-CM

## 2019-01-13 MED ORDER — PANTOPRAZOLE SODIUM 40 MG PO TBEC: 40.0000 mg | DELAYED_RELEASE_TABLET | ORAL | 1 refills | Status: DC

## 2019-01-13 MED ORDER — VALACYCLOVIR HCL 1 G PO TABS: 1000.0000 mg | ORAL_TABLET | Freq: Three times a day (TID) | ORAL | 0 refills | Status: DC

## 2019-01-13 MED ORDER — GABAPENTIN 100 MG PO CAPS: 100.0000 mg | ORAL_CAPSULE | Freq: Three times a day (TID) | ORAL | 1 refills | Status: DC

## 2019-01-13 MED ORDER — GABAPENTIN 100 MG PO CAPS: 100.0000 mg | ORAL_CAPSULE | Freq: Three times a day (TID) | ORAL | 0 refills | Status: DC

## 2019-01-13 NOTE — Progress Notes (Signed)
Patient ID: Shirley [name], female   DOB: 03/23/1981, 38 y.o.   MRN: [id number]   Subjective:    Patient ID: Shirley [name], female    DOB: 1981/10/22, 38 y.o.   MRN: [id number]  HPI  Patient here for a scheduled follow up.  Sees Dr Cruzita Lederer for f/u of her diabetes. Last seen 12/06/18.  On metformin.  Last a1c 6.5.  Discussed diet and exercise. Trying to stay active.  Reports problems recently with rash.  States noticed itching last week.  Was seen at [facility].  Prescribed TCC.  Noticed increased headache, which is now better.  Some decreased appetite.  Neck pain.  Took tylenol.  Helped some.  Noticed increased pain - right side.  Previously noticed feeling sick.  Took zofran.  Helped.  No nausea or vomiting now.  Head pain is better.  Still with increased neck pain and right side pain.  No fever.  No body aches now.  Eating better now.  No abdominal pain.  Bowels stable.     Past Medical History:  Diagnosis Date  . Anemia   . Asthma    WELL CONTROLLED  . Complication of anesthesia    -TACYCARDIA AFTER GASTRIC SURGERY IN 2016 AND HAD TO STAY 5 DAYS-itching after surgery 2005, 2006  . Depression   . Diabetes mellitus ([place])   . Fatty liver   . GERD (gastroesophageal reflux disease)   . Heart murmur   . History of dermoid cyst excision   . History of hiatal hernia   . Hypertension    H/O  . IBS (irritable bowel syndrome)   . Migraine   . PONV (postoperative nausea and vomiting)    nausea   Past Surgical History:  Procedure Laterality Date  . CHOLECYSTECTOMY  2005  . DILATATION & CURETTAGE/HYSTEROSCOPY WITH MYOSURE N/A 03/30/2018   Procedure: DILATATION & CURETTAGE/HYSTEROSCOPY;  Surgeon: Homero Fellers, MD;  Location: [facility] ORS;  Service: Gynecology;  Laterality: N/A;  . LAPAROSCOPIC GASTRIC RESTRICTIVE DUODENAL PROCEDURE (DUODENAL SWITCH) N/A 08/28/2015   Procedure: LAPAROSCOPIC GASTRIC RESTRICTIVE DUODENAL PROCEDURE (DUODENAL SWITCH);  Surgeon: Ladora Daniel, MD;  Location: [facility]  ORS;  Service: General;  Laterality: N/A;  . 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 Hypertension Mother   . Diabetes Mother   . Asthma Brother   . Breast cancer Neg Hx   . Colon cancer Neg Hx    Social History   Socioeconomic History  . Marital status: Single    Spouse name: Not on file  . Number of children: 0  . Years of education: Not on file  . Highest education level: Not on file  Occupational History    Employer: [employer]  . Financial resource strain: Not on file  . Food insecurity:    Worry: Not on file    Inability: Not on file  . Transportation needs:    Medical: Not on file    Non-medical: Not on file  Tobacco Use  . Smoking status: Never Smoker  . Smokeless tobacco: Never Used  Substance and Sexual Activity  . Alcohol use: Yes    Alcohol/week: 0.0 standard drinks    Comment: social  . Drug use: No  . Sexual activity: Not Currently    Birth control/protection: None  Lifestyle  . Physical activity:    Days per week: 1 day    Minutes per session: 30 min  .  Stress: Very much  Relationships  . Social connections:    Talks on phone: Not on file    Gets together: Not on file    Attends religious service: Not on file    Active member of club or organization: Not on file    Attends meetings of clubs or organizations: Not on file    Relationship status: Not on file  Other Topics Concern  . Not on file  Social History Narrative  . Not on file    Outpatient Encounter Medications as of 01/13/2019  Medication Sig  . albuterol (PROVENTIL HFA;VENTOLIN HFA) 108 (90 Base) MCG/ACT inhaler Inhale 2 puffs into the lungs every 6 (six) hours as needed for wheezing or shortness of breath. Reported on 05/01/2016  . azelastine (OPTIVAR) 0.05 % ophthalmic solution Place 1 drop into both eyes 2 (two) times daily as needed (for allergic eye). Reported on 05/01/2016  . CALCIUM CITRATE-VITAMIN D3 PO Take 1 tablet by  mouth 3 (three) times a week.  . citalopram (CELEXA) 20 MG tablet   . clindamycin (CLEOCIN T) 1 % external solution Apply 1 application topically 2 (two) times daily as needed (applied to breast, groin, and underarms as needed.).  Marland Kitchen clindamycin (CLEOCIN) 300 MG capsule Take 300 mg by mouth See admin instructions. Take 1 capsule (300 mg) daily for 2 weeks as needed HS  . Fluocinolone Acetonide Body 0.01 % OIL Apply 1 application topically as needed. Applied daily to scalp  . fluticasone (FLOVENT HFA) 110 MCG/ACT inhaler Inhale 2 puffs into the lungs every morning.   . gabapentin (NEURONTIN) 100 MG capsule Take 1 capsule (100 mg total) by mouth 3 (three) times daily.  Marland Kitchen gabapentin (NEURONTIN) 100 MG capsule Take 1 capsule (100 mg total) by mouth 3 (three) times daily.  Marland Kitchen glucose blood (PRECISION QID TEST) test strip Use 1 strip  twice a day  [dx 250.02]  . ketoconazole (NIZORAL) 2 % shampoo Apply 1 application topically 3 (three) times a week. Use 2-3 times a week. Leave in for 5 minutes before rinsing out  . lamoTRIgine (LAMICTAL) 200 MG tablet Take 200 mg by mouth daily.  Marland Kitchen levocetirizine (XYZAL) 5 MG tablet Take 1 tablet (5 mg total) by mouth every evening. Reported on 05/01/2016  . metFORMIN (GLUCOPHAGE-XR) 500 MG 24 hr tablet Take 1 tablet (500 mg total) by mouth 2 (two) times daily with a meal.  . montelukast (SINGULAIR) 10 MG tablet Take 10 mg by mouth at bedtime.  . Olopatadine HCl (PATANASE) 0.6 % SOLN Place 1 spray into the nose daily as needed (for allergies.).  Marland Kitchen pantoprazole (PROTONIX) 40 MG tablet Take 1 tablet (40 mg total) by mouth every morning.  . rifampin (RIFADIN) 300 MG capsule Take 300 mg by mouth See admin instructions. Take 1 capsule (300 mg) by mouth daily for 2 weeks for HS  . traZODone (DESYREL) 150 MG tablet Take 1 tablet (150 mg total) by mouth at bedtime.  . triamcinolone ointment (KENALOG) 0.1 % Apply 1 application topically 2 (two) times daily as needed (applied to  affected areas of scalp).  . valACYclovir (VALTREX) 1000 MG tablet Take 1 tablet (1,000 mg total) by mouth 3 (three) times daily.  . [DISCONTINUED] pantoprazole (PROTONIX) 40 MG tablet Take 1 tablet (40 mg total) by mouth every morning.   Facility-Administered Encounter Medications as of 01/13/2019  Medication  . polyethylene glycol (MIRALAX / GLYCOLAX) packet 17 g    Review of Systems  Constitutional: Negative for chills and  fever.  HENT: Negative for congestion and sinus pressure.   Respiratory: Negative for cough, chest tightness and shortness of breath.   Cardiovascular: Negative for chest pain, palpitations and leg swelling.  Gastrointestinal: Negative for abdominal pain, diarrhea, nausea and vomiting.  Genitourinary: Negative for difficulty urinating and dysuria.  Musculoskeletal:       Neck pain and right side pain as outlined.    Skin:       Rash that extends around her right upper back, right side, axilla and right anterior chest.    Neurological: Negative for dizziness and light-headedness.       Head pain is some better now.    Psychiatric/Behavioral: Negative for agitation and dysphoric mood.       Objective:    Physical Exam Constitutional:      General: She is not in acute distress.    Appearance: Normal appearance.  HENT:     Nose: Nose normal. No congestion.     Mouth/Throat:     Pharynx: No oropharyngeal exudate or posterior oropharyngeal erythema.  Neck:     Musculoskeletal: Neck supple. No muscular tenderness.     Thyroid: No thyromegaly.  Cardiovascular:     Rate and Rhythm: Normal rate and regular rhythm.  Pulmonary:     Effort: No respiratory distress.     Breath sounds: Normal breath sounds. No wheezing.  Abdominal:     General: Bowel sounds are normal.     Palpations: Abdomen is soft.     Tenderness: There is no abdominal tenderness.  Musculoskeletal:        General: No swelling or tenderness.  Lymphadenopathy:     Cervical: No cervical  adenopathy.  Skin:    Comments: Erythematous based rash - right upper back, right side, axilla and extends to right anterior chest.  Appears to be c/w shingles.    Neurological:     Mental Status: She is alert.  Psychiatric:        Mood and Affect: Mood normal.        Behavior: Behavior normal.     BP 138/78   Pulse 79   Temp 98 F (36.7 C) (Oral)   Resp 16   Wt 238 lb (108 kg)   SpO2 99%   BMI 44.97 kg/m  Wt Readings from Last 3 Encounters:  01/13/19 238 lb (108 kg)  12/06/18 233 lb (105.7 kg)  10/08/18 246 lb (111.6 kg)     Lab Results  Component Value Date   WBC 6.9 03/30/2018   HGB 12.3 03/30/2018   HCT 38.7 03/30/2018   PLT 228 03/30/2018   GLUCOSE 112 (H) 06/15/2018   CHOL 143 06/15/2018   TRIG 91 06/15/2018   HDL 62 06/15/2018   LDLCALC 63 06/15/2018   ALT 14 06/15/2018   AST 15 06/15/2018   NA 139 06/15/2018   K 4.4 06/15/2018   CL 101 06/15/2018   CREATININE 0.79 06/15/2018   BUN 9 06/15/2018   CO2 22 06/15/2018   TSH 1.420 06/15/2018   HGBA1C 6.5 (A) 12/06/2018       Assessment & Plan:   Problem List Items Addressed This Visit    Diabetes mellitus without complication ([place])    Low carb diet and exercise.  Sees endocrinology.  Last a1c 6.5.  Follow met b and a1c.       Environmental allergies    Followed by Dr Donneta Romberg.  Stable on current regimen.        GERD (gastroesophageal reflux  disease)    Controlled on current regimen.  Follow.        Relevant Medications   pantoprazole (PROTONIX) 40 MG tablet   Headache    Head pain better.  With rash that appears to be c/w herpes zoster diagnosis.  Treat with gabapentin as outlined.        Relevant Medications   gabapentin (NEURONTIN) 100 MG capsule   lamoTRIgine (LAMICTAL) 200 MG tablet   gabapentin (NEURONTIN) 100 MG capsule   Herpes zoster    Rash appears to be c/w herpes zoster.  Increased pain.  Treat with valtrex as directed.  Gabapentin as directed.  Titrate up as needed.  Tylenol.   Follow.        Relevant Medications   valACYclovir (VALTREX) 1000 MG tablet   Hypertension    Blood pressure slightly elevated today.  Feel related to increased pain from shingles.  Hold on making changes in medication.  Follow pressures.  Follow metabolic panel.        Major depressive disorder, recurrent episode, moderate ([place])    Has been followed by psychiatry.  Off medication except for trazodone.  Overall doing better.  Follow.            Einar Pheasant, MD

## 2019-01-16 DIAGNOSIS — B029 Zoster without complications: Secondary | ICD-10-CM | POA: Insufficient documentation

## 2019-01-16 NOTE — Assessment & Plan Note (Signed)
Controlled on current regimen.  Follow.  

## 2019-01-16 NOTE — Assessment & Plan Note (Signed)
Low carb diet and exercise.  Sees endocrinology.  Last a1c 6.5.  Follow met b and a1c.

## 2019-01-16 NOTE — Assessment & Plan Note (Signed)
Blood pressure slightly elevated today.  Feel related to increased pain from shingles.  Hold on making changes in medication.  Follow pressures.  Follow metabolic panel.

## 2019-01-16 NOTE — Assessment & Plan Note (Signed)
Followed by Dr Sharma.  Stable on current regimen.   

## 2019-01-16 NOTE — Assessment & Plan Note (Signed)
Rash appears to be c/w herpes zoster.  Increased pain.  Treat with valtrex as directed.  Gabapentin as directed.  Titrate up as needed.  Tylenol.  Follow.

## 2019-01-16 NOTE — Assessment & Plan Note (Signed)
Head pain better.  With rash that appears to be c/w herpes zoster diagnosis.  Treat with gabapentin as outlined.

## 2019-01-16 NOTE — Assessment & Plan Note (Signed)
Has been followed by psychiatry.  Off medication except for trazodone.  Overall doing better.  Follow.

## 2019-02-04 LAB — LIPID PANEL
Chol/HDL Ratio: 2.3 ratio (ref 0.0–4.4)
Cholesterol, Total: 153 mg/dL (ref 100–199)
HDL: 67 mg/dL (ref >39)
LDL Calculated: 76 mg/dL (ref 0–99)
Triglycerides: 49 mg/dL (ref 0–149)
VLDL Cholesterol Cal: 10 mg/dL (ref 5–40)

## 2019-02-04 LAB — BASIC METABOLIC PANEL
BUN/Creatinine Ratio: 16 (ref 9–23)
BUN: 13 mg/dL (ref 6–20)
CO2: 23 mmol/L (ref 20–29)
Calcium: 8.8 mg/dL (ref 8.7–10.2)
Chloride: 102 mmol/L (ref 96–106)
Creatinine, Ser: 0.83 mg/dL (ref 0.57–1.00)
GFR calc Af Amer: 103 mL/min/{1.73_m2} (ref >59)
GFR calc non Af Amer: 89 mL/min/{1.73_m2} (ref >59)
Glucose: 102 mg/dL — ABNORMAL HIGH (ref 65–99)
Potassium: 4.3 mmol/L (ref 3.5–5.2)
Sodium: 140 mmol/L (ref 134–144)

## 2019-02-04 LAB — VITAMIN B12: Vitamin B-12: 885 pg/mL (ref 232–1245)

## 2019-02-04 LAB — HEPATIC FUNCTION PANEL
ALT: 14 IU/L (ref 0–32)
AST: 16 IU/L (ref 0–40)
Albumin: 4 g/dL (ref 3.8–4.8)
Alkaline Phosphatase: 114 IU/L (ref 39–117)
Bilirubin Total: 0.3 mg/dL (ref 0.0–1.2)
Bilirubin, Direct: 0.11 mg/dL (ref 0.00–0.40)
Total Protein: 6.4 g/dL (ref 6.0–8.5)

## 2019-02-04 LAB — FERRITIN: Ferritin: 14 ng/mL — ABNORMAL LOW (ref 15–150)

## 2019-02-04 LAB — CBC WITH DIFFERENTIAL/PLATELET
Basophils Absolute: 0 10*3/uL (ref 0.0–0.2)
Basos: 0 %
EOS (ABSOLUTE): 0.1 10*3/uL (ref 0.0–0.4)
Eos: 1 %
Hematocrit: 37.1 % (ref 34.0–46.6)
Hemoglobin: 11.1 g/dL (ref 11.1–15.9)
Immature Grans (Abs): 0 10*3/uL (ref 0.0–0.1)
Immature Granulocytes: 0 %
Lymphocytes Absolute: 2.8 10*3/uL (ref 0.7–3.1)
Lymphs: 43 %
MCH: 22.6 pg — ABNORMAL LOW (ref 26.6–33.0)
MCHC: 29.9 g/dL — ABNORMAL LOW (ref 31.5–35.7)
MCV: 75 fL — ABNORMAL LOW (ref 79–97)
Monocytes Absolute: 0.6 10*3/uL (ref 0.1–0.9)
Monocytes: 9 %
Neutrophils Absolute: 3 10*3/uL (ref 1.4–7.0)
Neutrophils: 47 %
Platelets: 250 10*3/uL (ref 150–450)
RBC: 4.92 x10E6/uL (ref 3.77–5.28)
RDW: 14.7 % (ref 11.7–15.4)
WBC: 6.4 10*3/uL (ref 3.4–10.8)

## 2019-02-04 LAB — HEMOGLOBIN A1C
Est. average glucose Bld gHb Est-mCnc: 140 mg/dL
Hgb A1c MFr Bld: 6.5 % — ABNORMAL HIGH (ref 4.8–5.6)

## 2019-02-05 ENCOUNTER — Encounter: Payer: Self-pay | Admitting: Internal Medicine

## 2019-02-11 ENCOUNTER — Other Ambulatory Visit: Payer: Self-pay

## 2019-02-11 ENCOUNTER — Ambulatory Visit: Payer: Managed Care, Other (non HMO) | Admitting: Internal Medicine

## 2019-02-11 ENCOUNTER — Encounter: Payer: Self-pay | Admitting: Internal Medicine

## 2019-02-11 DIAGNOSIS — K219 Gastro-esophageal reflux disease without esophagitis: Secondary | ICD-10-CM

## 2019-02-11 DIAGNOSIS — F331 Major depressive disorder, recurrent, moderate: Secondary | ICD-10-CM

## 2019-02-11 DIAGNOSIS — F411 Generalized anxiety disorder: Secondary | ICD-10-CM

## 2019-02-11 DIAGNOSIS — D509 Iron deficiency anemia, unspecified: Secondary | ICD-10-CM

## 2019-02-11 DIAGNOSIS — E119 Type 2 diabetes mellitus without complications: Secondary | ICD-10-CM

## 2019-02-11 DIAGNOSIS — Z9109 Other allergy status, other than to drugs and biological substances: Secondary | ICD-10-CM | POA: Diagnosis not present

## 2019-02-11 DIAGNOSIS — E559 Vitamin D deficiency, unspecified: Secondary | ICD-10-CM

## 2019-02-11 DIAGNOSIS — B029 Zoster without complications: Secondary | ICD-10-CM

## 2019-02-11 DIAGNOSIS — I10 Essential (primary) hypertension: Secondary | ICD-10-CM

## 2019-02-11 MED ORDER — INTEGRA 62.5-62.5-40-3 MG PO CAPS: ORAL_CAPSULE | ORAL | 3 refills | Status: DC

## 2019-02-11 NOTE — Progress Notes (Signed)
Patient ID: Shirley [name], female   DOB: 1980/12/30, 38 y.o.   MRN: [id number]   Subjective:    Patient ID: Shirley [name], female    DOB: [birth year], 38 y.o.   MRN: [id number]  HPI  Patient here for a scheduled follow up.  She was recently seen and diagnosed with shingles.  Treated.  Pain has resolved.  Rash has resolved.  Seeing Dr Cleda Mccreedy for her foot pain.  In a boot.  Has f/u planned 02/16/19.  Working.  Some increased stress at work, but overall she feels she is handling things relatively well.  No chest pain.  No sob.  No acid reflux.  No abdominal pain.  Bowels moving.  Discussed labs.  Start integra.     Past Medical History:  Diagnosis Date   Anemia    Asthma    WELL CONTROLLED   Complication of anesthesia    -TACYCARDIA AFTER GASTRIC SURGERY IN 2016 AND HAD TO STAY 5 DAYS-itching after surgery 2005, 2006   Depression    Diabetes mellitus ([place])    Fatty liver    GERD (gastroesophageal reflux disease)    Heart murmur    History of dermoid cyst excision    History of hiatal hernia    Hypertension    H/O   IBS (irritable bowel syndrome)    Migraine    PONV (postoperative nausea and vomiting)    nausea   Past Surgical History:  Procedure Laterality Date   CHOLECYSTECTOMY  2005   DILATATION & CURETTAGE/HYSTEROSCOPY WITH MYOSURE N/A 03/30/2018   Procedure: DILATATION & CURETTAGE/HYSTEROSCOPY;  Surgeon: Homero Fellers, MD;  Location: [facility] OR[facility];  Service: Gynecology;  Laterality: N/A;   LAPAROSCOPIC GASTRIC RESTRICTIVE DUODENAL PROCEDURE (DUODENAL SWITCH) N/A 08/28/2015   Procedure: LAPAROSCOPIC GASTRIC RESTRICTIVE DUODENAL PROCEDURE (DUODENAL SWITCH);  Surgeon: Ladora Daniel, MD;  Location: [facility] OR[facility];  Service: General;  Laterality: N/A;   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   Family History  Problem Relation Age of Onset   Hypertension Mother    Diabetes Mother    Asthma Brother    Breast cancer Neg Hx    Colon cancer Neg Hx    Social  History   Socioeconomic History   Marital status: Single    Spouse name: Not on file   Number of children: 0   Years of education: Not on file   Highest education level: Not on file  Occupational History    Employer: [employer]  Social Needs   Financial resource strain: Not on file   Food insecurity:    Worry: Not on file    Inability: Not on file   Transportation needs:    Medical: Not on file    Non-medical: Not on file  Tobacco Use   Smoking status: Never Smoker   Smokeless tobacco: Never Used  Substance and Sexual Activity   Alcohol use: Yes    Alcohol/week: 0.0 standard drinks    Comment: social   Drug use: No   Sexual activity: Not Currently    Birth control/protection: None  Lifestyle   Physical activity:    Days per week: 1 day    Minutes per session: 30 min   Stress: Very much  Relationships   Social connections:    Talks on phone: Not on file    Gets together: Not on file    Attends religious service: Not on file    Active member of club or organization: Not on file  Attends meetings of clubs or organizations: Not on file    Relationship status: Not on file  Other Topics Concern   Not on file  Social History Narrative   Not on file    Outpatient Encounter Medications as of 02/11/2019  Medication Sig   albuterol (PROVENTIL HFA;VENTOLIN HFA) 108 (90 Base) MCG/ACT inhaler Inhale 2 puffs into the lungs every 6 (six) hours as needed for wheezing or shortness of breath. Reported on 05/01/2016   azelastine (OPTIVAR) 0.05 % ophthalmic solution Place 1 drop into both eyes 2 (two) times daily as needed (for allergic eye). Reported on 05/01/2016   CALCIUM CITRATE-VITAMIN D3 PO Take 1 tablet by mouth 3 (three) times a week.   citalopram (CELEXA) 20 MG tablet    clindamycin (CLEOCIN T) 1 % external solution Apply 1 application topically 2 (two) times daily as needed (applied to breast, groin, and underarms as needed.).   clindamycin (CLEOCIN) 300 MG  capsule Take 300 mg by mouth See admin instructions. Take 1 capsule (300 mg) daily for 2 weeks as needed HS   Fe Fum-FePoly-Vit C-Vit B3 (INTEGRA) 62.5-62.5-40-3 MG CAPS One capsule per day   [EXPIRED] Fluocinolone Acetonide Body 0.01 % OIL Apply 1 application topically as needed. Applied daily to scalp   fluticasone (FLOVENT HFA) 110 MCG/ACT inhaler Inhale 2 puffs into the lungs every morning.    glucose blood (PRECISION QID TEST) test strip Use 1 strip  twice a day  [dx 250.02]   ketoconazole (NIZORAL) 2 % shampoo Apply 1 application topically 3 (three) times a week. Use 2-3 times a week. Leave in for 5 minutes before rinsing out   lamoTRIgine (LAMICTAL) 200 MG tablet Take 200 mg by mouth daily.   levocetirizine (XYZAL) 5 MG tablet Take 1 tablet (5 mg total) by mouth every evening. Reported on 05/01/2016   metFORMIN (GLUCOPHAGE-XR) 500 MG 24 hr tablet Take 1 tablet (500 mg total) by mouth 2 (two) times daily with a meal.   montelukast (SINGULAIR) 10 MG tablet Take 10 mg by mouth at bedtime.   Olopatadine HCl (PATANASE) 0.6 % SOLN Place 1 spray into the nose daily as needed (for allergies.).   pantoprazole (PROTONIX) 40 MG tablet Take 1 tablet (40 mg total) by mouth every morning.   rifampin (RIFADIN) 300 MG capsule Take 300 mg by mouth See admin instructions. Take 1 capsule (300 mg) by mouth daily for 2 weeks for HS   traZODone (DESYREL) 150 MG tablet Take 1 tablet (150 mg total) by mouth at bedtime.   triamcinolone ointment (KENALOG) 0.1 % Apply 1 application topically 2 (two) times daily as needed (applied to affected areas of scalp).   [DISCONTINUED] gabapentin (NEURONTIN) 100 MG capsule Take 1 capsule (100 mg total) by mouth 3 (three) times daily.   [DISCONTINUED] gabapentin (NEURONTIN) 100 MG capsule Take 1 capsule (100 mg total) by mouth 3 (three) times daily.   [DISCONTINUED] valACYclovir (VALTREX) 1000 MG tablet Take 1 tablet (1,000 mg total) by mouth 3 (three) times daily.     Facility-Administered Encounter Medications as of 02/11/2019  Medication   polyethylene glycol (MIRALAX / GLYCOLAX) packet 17 g    Review of Systems  Constitutional: Negative for appetite change and unexpected weight change.  HENT: Negative for congestion and sinus pressure.   Respiratory: Negative for cough, chest tightness and shortness of breath.   Cardiovascular: Negative for chest pain, palpitations and leg swelling.  Gastrointestinal: Negative for abdominal pain, diarrhea, nausea and vomiting.  Genitourinary: Negative for  difficulty urinating and dysuria.  Musculoskeletal: Negative for joint swelling and myalgias.       Foot pain as outlined.    Skin: Negative for color change and rash.  Neurological: Negative for dizziness, light-headedness and headaches.  Psychiatric/Behavioral: Negative for agitation and dysphoric mood.       Objective:    Physical Exam Constitutional:      General: She is not in acute distress.    Appearance: Normal appearance.  HENT:     Nose: Nose normal. No congestion.     Mouth/Throat:     Pharynx: No oropharyngeal exudate or posterior oropharyngeal erythema.  Neck:     Musculoskeletal: Neck supple. No muscular tenderness.     Thyroid: No thyromegaly.  Cardiovascular:     Rate and Rhythm: Normal rate and regular rhythm.  Pulmonary:     Effort: No respiratory distress.     Breath sounds: Normal breath sounds. No wheezing.  Abdominal:     General: Bowel sounds are normal.     Palpations: Abdomen is soft.     Tenderness: There is no abdominal tenderness.  Musculoskeletal:        General: No swelling or tenderness.  Lymphadenopathy:     Cervical: No cervical adenopathy.  Skin:    Findings: No erythema or rash.  Neurological:     Mental Status: She is alert.  Psychiatric:        Mood and Affect: Mood normal.        Behavior: Behavior normal.     BP 130/72    Pulse 98    Temp 98.2 F (36.8 C) (Oral)    Resp 16    Wt 240 lb 3.2 oz  (109 kg)    SpO2 97%    BMI 45.39 kg/m  Wt Readings from Last 3 Encounters:  02/11/19 240 lb 3.2 oz (109 kg)  01/13/19 238 lb (108 kg)  12/06/18 233 lb (105.7 kg)     Lab Results  Component Value Date   WBC 6.4 02/03/2019   HGB 11.1 02/03/2019   HCT 37.1 02/03/2019   PLT 250 02/03/2019   GLUCOSE 102 (H) 02/03/2019   CHOL 153 02/03/2019   TRIG 49 02/03/2019   HDL 67 02/03/2019   LDLCALC 76 02/03/2019   ALT 14 02/03/2019   AST 16 02/03/2019   NA 140 02/03/2019   K 4.3 02/03/2019   CL 102 02/03/2019   CREATININE 0.83 02/03/2019   BUN 13 02/03/2019   CO2 23 02/03/2019   TSH 1.420 06/15/2018   HGBA1C 6.5 (H) 02/03/2019       Assessment & Plan:   Problem List Items Addressed This Visit    Anemia    Start integra.  Follow cbc and ferritin.        Relevant Medications   Fe Fum-FePoly-Vit C-Vit B3 (INTEGRA) 62.5-62.5-40-3 MG CAPS   Other Relevant Orders   CBC with Differential/Platelet   Ferritin   Diabetes mellitus without complication (HCC)    Low carb diet and exercise.  Follow met b and a1c.  a1c 6.5 on recent checks.        Environmental allergies    Followed by Dr Donneta Romberg.  Currently stable on current regimen.        Generalized anxiety disorder    Doing well on current regimen.  Follow.       GERD (gastroesophageal reflux disease)    Controlled on current regimen.        Herpes zoster  Pain resolved.  Rash resolved.        Hypertension    Blood pressure under good control.  Continue same medication regimen.  Follow pressures.  Follow metabolic panel.        Major depressive disorder, recurrent episode, moderate ([place])    Has been followed by psychiatry.        Severe obesity (BMI >= 40) (HCC)    Discussed diet and exercise.        Vitamin D deficiency    Vitamin D3 1000 units per day.            Einar Pheasant, MD

## 2019-02-11 NOTE — Patient Instructions (Signed)
Recheck non fasting lab in 8 weeks.

## 2019-02-13 ENCOUNTER — Encounter: Payer: Self-pay | Admitting: Internal Medicine

## 2019-02-13 DIAGNOSIS — D649 Anemia, unspecified: Secondary | ICD-10-CM | POA: Insufficient documentation

## 2019-02-13 NOTE — Assessment & Plan Note (Signed)
Pain resolved.  Rash resolved.

## 2019-02-13 NOTE — Assessment & Plan Note (Signed)
Start integra.  Follow cbc and ferritin.

## 2019-02-13 NOTE — Assessment & Plan Note (Signed)
Has been followed by psychiatry.   

## 2019-02-13 NOTE — Assessment & Plan Note (Signed)
Controlled on current regimen.   

## 2019-02-13 NOTE — Assessment & Plan Note (Signed)
Vitamin D3 1000 units per day 

## 2019-02-13 NOTE — Assessment & Plan Note (Signed)
Low carb diet and exercise.  Follow met b and a1c.  a1c 6.5 on recent checks.

## 2019-02-13 NOTE — Assessment & Plan Note (Signed)
Doing well on current regimen.  Follow.   

## 2019-02-13 NOTE — Assessment & Plan Note (Signed)
Followed by Dr [place] Callas.  Currently stable on current regimen.

## 2019-02-13 NOTE — Assessment & Plan Note (Signed)
Blood pressure under good control.  Continue same medication regimen.  Follow pressures.  Follow metabolic panel.   

## 2019-02-13 NOTE — Assessment & Plan Note (Signed)
Discussed diet and exercise 

## 2019-03-11 ENCOUNTER — Encounter: Payer: Self-pay | Admitting: Internal Medicine

## 2019-03-30 ENCOUNTER — Encounter: Payer: Self-pay | Admitting: Internal Medicine

## 2019-04-01 ENCOUNTER — Encounter: Payer: Self-pay | Admitting: Internal Medicine

## 2019-04-01 ENCOUNTER — Ambulatory Visit (INDEPENDENT_AMBULATORY_CARE_PROVIDER_SITE_OTHER): Payer: Managed Care, Other (non HMO) | Admitting: Internal Medicine

## 2019-04-01 DIAGNOSIS — R809 Proteinuria, unspecified: Secondary | ICD-10-CM

## 2019-04-01 DIAGNOSIS — E119 Type 2 diabetes mellitus without complications: Secondary | ICD-10-CM | POA: Diagnosis not present

## 2019-04-01 MED ORDER — DAPAGLIFLOZIN PROPANEDIOL 5 MG PO TABS: 5.0000 mg | ORAL_TABLET | Freq: Every day | ORAL | 5 refills | Status: DC

## 2019-04-01 NOTE — Progress Notes (Signed)
Patient ID: Shirley [name], female   DOB: 09-07-1981, 38 y.o.   MRN: [id number]  Patient location: Home My location: Office  Referring Provider: Dale DurhamScott, Charlene, MD  I connected with the patient on 04/01/19 at  3:32 PM EDT by a video enabled telemedicine application and verified that I am speaking with the correct person.   I discussed the limitations of evaluation and management by telemedicine and the availability of in person appointments. The patient expressed understanding and agreed to proceed.   Details of the encounter are shown below.  HPI: Shirley DomRaytarsha M Gillison is a 38 y.o.-year-old female, presenting for f/u for DM2, dx 2005, non-insulin-dependent, uncontrolled, with complications ([facility]). Last visit 4 months ago.  She has a history of gastric bypass (sleeve gastrectomy) in 08/2015.She did have complications after the surgery: Hypoglycemia and hypotension, both resolved.  Her diabetes dramatically improved after the surgery (she also lost ~50-60 pounds) and she could come off all of the medicines.  Since then, sugars worsened and we restarted metformin ER in 02/2018.  In the past, she had GERD and abdominal pain.  She saw her bariatric surgeon and the barium swallow showed abnormal stomach architecture.  She was told she may need a revision surgery.  However, she was started on acid reflux medicines and her symptoms improved significantly  Before last visit, she started the [employer] at [place] and lost 13 pounds.  However, she gained 7 pounds since then.  Last hemoglobin A1c was: Lab Results  Component Value Date   HGBA1C 6.5 (H) 02/03/2019   HGBA1C 6.5 (A) 12/06/2018   HGBA1C 6.9 (A) 08/03/2018  07/15/2015: 7.1% 07/13/2014: 7.2%  - at work 01/2014: 7.8% 08/2013: 7.2%  She is now on: - Metformin ER 500 mg twice a day  Previous regimen: - Januvia 100 mg in am - Metformin XR (osm) 1000 mg tabs 2x a day with meals >> some loose stools - Invokana 100 - added 03/2014 She was  on Janumet XR 1000-100 mg po with dinner (used to cut them in half to swallow them better!!!) She was on Metformin in the past >> diarrhea at max dose  She checks her sugars 0 to once a day: - am: 122-130, 147  >> 119, 135, 143 >> 96-122 - 2h after b'fast: 110-120s, 145 >> 149 >> n/c - before lunch: 104-149, 161 >> n/c >> 109 >> 114-154 - 2h after lunch:130 >> 144, 191, 220 >> 113-135, 176, 206 - before dinner: 131-162 >> 100-136 >> n/c - 2h after dinner: 147-180 >>120s >> n/c >> 137-176, 228-257 (steroids) - bedtime: n/c - nighttime: n/c >> 168 Lowest sugar was 109 >> 98; it is unclear at which level she has hypoglycemia avoidance Highest sugar was 220 (3 bananas) >> 257.  -No CKD, last BUN/creatinine: Lab Results  Component Value Date   BUN 13 02/03/2019   CREATININE 0.83 02/03/2019  Off losartan.  Normal ACR: Lab Results  Component Value Date   MICRALBCREAT 11.2 07/31/2016   MICRALBCREAT 12.1 08/10/2015   -no HL, Lab Results  Component Value Date   CHOL 153 02/03/2019   HDL 67 02/03/2019   LDLCALC 76 02/03/2019   TRIG 49 02/03/2019   CHOLHDL 2.3 02/03/2019  Not on a statin. - last eye exam was in 12/2018: no DR - No numbness and tingling in her feet.  She also has a history of vitamin D deficiency (on vitamin D 5000 units daily), HTN, GERD.   ROS: Constitutional: no weight gain/no weight  loss, no fatigue, no subjective hyperthermia, no subjective hypothermia Eyes: no blurry vision, no xerophthalmia ENT: no sore throat, no nodules palpated in neck, no dysphagia, no odynophagia, no hoarseness Cardiovascular: no CP/no SOB/no palpitations/no leg swelling Respiratory: no cough/no SOB/no wheezing Gastrointestinal: no N/no V/no D/no C/no acid reflux Musculoskeletal: no muscle aches/no joint aches Skin: no rashes, no hair loss Neurological: no tremors/no numbness/no tingling/no dizziness  I reviewed pt's medications, allergies, PMH, social hx, family hx, and changes  were documented in the history of present illness. Otherwise, unchanged from my initial visit note.  Past Medical History:  Diagnosis Date  . Anemia   . Asthma    WELL CONTROLLED  . Complication of anesthesia    -TACYCARDIA AFTER GASTRIC SURGERY IN 2016 AND HAD TO STAY 5 DAYS-itching after surgery 2005, 2006  . Depression   . Diabetes mellitus (HCC)   . Fatty liver   . GERD (gastroesophageal reflux disease)   . Heart murmur   . History of dermoid cyst excision   . History of hiatal hernia   . Hypertension    H/O  . IBS (irritable bowel syndrome)   . Migraine   . PONV (postoperative nausea and vomiting)    nausea   Past Surgical History:  Procedure Laterality Date  . CHOLECYSTECTOMY  2005  . DILATATION & CURETTAGE/HYSTEROSCOPY WITH MYOSURE N/A 03/30/2018   Procedure: DILATATION & CURETTAGE/HYSTEROSCOPY;  Surgeon: Natale Milch, MD;  Location: [facility] ORS;  Service: Gynecology;  Laterality: N/A;  . LAPAROSCOPIC GASTRIC RESTRICTIVE DUODENAL PROCEDURE (DUODENAL SWITCH) N/A 08/28/2015   Procedure: LAPAROSCOPIC GASTRIC RESTRICTIVE DUODENAL PROCEDURE (DUODENAL SWITCH);  Surgeon: [name], MD;  Location: [facility];  Service: General;  Laterality: N/A;  . 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 Marital status: Single    Spouse name: Not on file  . Number of children: 0  . Years of education: Not on file  . Highest education level: Not on file  Occupational History    Employer: [employer]  Social Needs  . Financial resource strain: Not on file  . Food insecurity:    Worry: Not on file    Inability: Not on file  . Transportation needs:    Medical: Not on file    Non-medical: Not on file  Tobacco Use  . Smoking status: Never Smoker  . Smokeless tobacco: Never Used  Substance and Sexual Activity  . Alcohol use: Yes    Alcohol/week: 0.0 standard drinks    Comment: social  . Drug use: No  . Sexual activity: Not Currently     Birth control/protection: None  Lifestyle  . Physical activity:    Days per week: 1 day    Minutes per session: 30 min  . Stress: Very much  Relationships  . Social connections:    Talks on phone: Not on file    Gets together: Not on file    Attends religious service: Not on file    Active member of club or organization: Not on file    Attends meetings of clubs or organizations: Not on file    Relationship status: Not on file  . Intimate partner violence:    Fear of current or ex partner: Not on file    Emotionally abused: Not on file    Physically abused: Not on file    Forced sexual activity: Not on file  Other Topics Concern  . Not on file  Social  History Narrative  . Not on file   Current Outpatient Medications on File Prior to Visit  Medication Sig Dispense Refill  . albuterol (PROVENTIL HFA;VENTOLIN HFA) 108 (90 Base) MCG/ACT inhaler Inhale 2 puffs into the lungs every 6 (six) hours as needed for wheezing or shortness of breath. Reported on 05/01/2016 1 Inhaler 1  . azelastine (OPTIVAR) 0.05 % ophthalmic solution Place 1 drop into both eyes 2 (two) times daily as needed (for allergic eye). Reported on 05/01/2016    . CALCIUM CITRATE-VITAMIN D3 PO Take 1 tablet by mouth 3 (three) times a week.    . citalopram (CELEXA) 20 MG tablet     . clindamycin (CLEOCIN T) 1 % external solution Apply 1 application topically 2 (two) times daily as needed (applied to breast, groin, and underarms as needed.).    Marland Kitchen clindamycin (CLEOCIN) 300 MG capsule Take 300 mg by mouth See admin instructions. Take 1 capsule (300 mg) daily for 2 weeks as needed HS    . Fe Fum-FePoly-Vit C-Vit B3 (INTEGRA) 62.5-62.5-40-3 MG CAPS One capsule per day 30 capsule 3  . fluticasone (FLOVENT HFA) 110 MCG/ACT inhaler Inhale 2 puffs into the lungs every morning.     Marland Kitchen glucose blood (PRECISION QID TEST) test strip Use 1 strip  twice a day  [dx 250.02]    . ketoconazole (NIZORAL) 2 % shampoo Apply 1 application topically 3  (three) times a week. Use 2-3 times a week. Leave in for 5 minutes before rinsing out    . lamoTRIgine (LAMICTAL) 200 MG tablet Take 200 mg by mouth daily.    Marland Kitchen levocetirizine (XYZAL) 5 MG tablet Take 1 tablet (5 mg total) by mouth every evening. Reported on 05/01/2016 30 tablet 1  . metFORMIN (GLUCOPHAGE-XR) 500 MG 24 hr tablet Take 1 tablet (500 mg total) by mouth 2 (two) times daily with a meal. 180 tablet 3  . montelukast (SINGULAIR) 10 MG tablet Take 10 mg by mouth at bedtime.    . Olopatadine HCl (PATANASE) 0.6 % SOLN Place 1 spray into the nose daily as needed (for allergies.). 1 Bottle 1  . pantoprazole (PROTONIX) 40 MG tablet Take 1 tablet (40 mg total) by mouth every morning. 90 tablet 1  . rifampin (RIFADIN) 300 MG capsule Take 300 mg by mouth See admin instructions. Take 1 capsule (300 mg) by mouth daily for 2 weeks for HS    . traZODone (DESYREL) 150 MG tablet Take 1 tablet (150 mg total) by mouth at bedtime. 90 tablet 1  . triamcinolone ointment (KENALOG) 0.1 % Apply 1 application topically 2 (two) times daily as needed (applied to affected areas of scalp).     Current Facility-Administered Medications on File Prior to Visit  Medication Dose Route Frequency Provider Last Rate Last Dose  . polyethylene glycol (MIRALAX / GLYCOLAX) packet 17 g  17 g Oral Daily Schuman, Christanna R, MD       Allergies  Allergen Reactions  . Ibuprofen Other (See Comments)    Upset stomach   . Caffeine Other (See Comments)    Stomach issues   Family History  Problem Relation Age of Onset  . Hypertension Mother   . Diabetes Mother   . Asthma Brother   . Breast cancer Neg Hx   . Colon cancer Neg Hx     PE: There were no vitals taken for this visit. There is no height or weight on file to calculate BMI. Wt Readings from Last 3 Encounters:  02/11/19  240 lb 3.2 oz (109 kg)  01/13/19 238 lb (108 kg)  12/06/18 233 lb (105.7 kg)   Constitutional:  in NAD  The physical exam was not performed  (virtual visit).  ASSESSMENT: 1. DM2, non-insulin-dependent, uncontrolled, without complications - MAU  2. Obesity class III  3.  Microalbuminuria  PLAN:  1. Patient with longstanding, uncontrolled, type 2 diabetes, greatly improved after gastric bypass surgery in 2016.  She was able to come off all of her medicines.  However, afterwards, we started her back on metformin as sugars increased. At last visit I encouraged her to continue the mostly plant-based diet (she was on the Daniel fast at that time). -HbA1c 2 months ago was stable at 6.5%. -At this visit, sugars are slightly higher, with occasional spikes.  Her sugars were much higher when she was on steroids.  We discussed that in the future, if she needs steroids again, we may need to look use glipizide or even rapid acting insulin.  She will let us know if she needs this in the future. -For now, we discussed about the benefits of adding an SGLT2 inhibitor, not only for diabetes, but also weight, cardiovascular and renal complications.  She does have microalbuminuria.  She agrees to try a low-dose [place]. - I advised her to: Patient Instructions  Please continue: - Metformin ER 500 mg 2x a day  Start: - Farxiga 5 mg before b'fast  Please come back for a follow-up appointment in 4 months.  -We will check another HbA1c when she returns to the clinic - continue checking sugars at different times of the day - check 1x a day, rotating checks - advised for yearly eye exams >> she is UTD - Return to clinic in 4 mo with sugar log      2.  Obesity class III -Had gastric sleeve surgery in 2016 -Continue metformin which should help with long-term weight maintenance and decreased appetite -She gained 7 pounds since last visit  3. MAU -She has a history of microalbuminuria -We will start an SGLT 2 inhibitor which should help with this and repeat her ACR at next visit  Carlus Pavlov, MD PhD [facility] Endocrinology

## 2019-04-01 NOTE — Patient Instructions (Addendum)
Please continue: - Metformin ER 500 mg 2x a day  Start: - Farxiga 5 mg before b'fast  Please come back for a follow-up appointment in 4 months.

## 2019-04-05 ENCOUNTER — Other Ambulatory Visit: Payer: Self-pay

## 2019-04-05 MED ORDER — DAPAGLIFLOZIN PROPANEDIOL 5 MG PO TABS: 5.0000 mg | ORAL_TABLET | Freq: Every day | ORAL | 5 refills | Status: DC

## 2019-04-12 ENCOUNTER — Telehealth: Payer: Self-pay

## 2019-04-12 MED ORDER — DAPAGLIFLOZIN PROPANEDIOL 5 MG PO TABS: 5.0000 mg | ORAL_TABLET | Freq: Every day | ORAL | 5 refills | Status: DC

## 2019-04-12 NOTE — Telephone Encounter (Signed)
Prior authorization for Shirley [name] has been approved by patient's insurance.  Coverage is effective  Until 04/10/2020 Case number [id number]  Approval letter has been sent to scanning.

## 2019-04-15 ENCOUNTER — Encounter: Payer: Self-pay | Admitting: Internal Medicine

## 2019-04-15 NOTE — Telephone Encounter (Signed)
Called and spoke to pt.  Pt said that she is having back pain that is radiating from both sides of her buttocks up both sides of her back into her shoulder blades.  Pt said that the pain is a 9 out of 10 on the pain scale and that it hurts so bad that she is unable to sit down.  Pt said that she was in so much pain that she had to leave work early.  Pt denies having no known injuries.  Pt has tried taking Tylenol but said that it has not helped.  Discussed with pt about using heating pads and ice packs but since pain is so intense and she can not sit down recommended pt to go to UC to be evaluated since PCP has no appt available today on her schedule and the FNP only has a virtual appt available today @ 4:00 pm.  Pt said that she would try to get to UC.  Will forward note to PCP for advisement.

## 2019-04-15 NOTE — Telephone Encounter (Signed)
Agree with need for evaluation especially given increased pain.  Needs to be seen today.

## 2019-04-15 NOTE — Telephone Encounter (Signed)
Spoke to pt.  Advised pt to be seen today at [facility] per PCP's note.  Recommended [facility] walk-in clinic.  Pt agreed to try to get to UC today.

## 2019-04-22 ENCOUNTER — Encounter: Payer: Self-pay | Admitting: Internal Medicine

## 2019-06-08 ENCOUNTER — Other Ambulatory Visit: Payer: Self-pay | Admitting: Internal Medicine

## 2019-06-24 ENCOUNTER — Encounter: Payer: Self-pay | Admitting: Internal Medicine

## 2019-06-24 LAB — CBC WITH DIFFERENTIAL/PLATELET
Basophils Absolute: 0 10*3/uL (ref 0.0–0.2)
Basos: 1 %
EOS (ABSOLUTE): 0.1 10*3/uL (ref 0.0–0.4)
Eos: 1 %
Hematocrit: 39.8 % (ref 34.0–46.6)
Hemoglobin: 12.1 g/dL (ref 11.1–15.9)
Immature Grans (Abs): 0 10*3/uL (ref 0.0–0.1)
Immature Granulocytes: 0 %
Lymphocytes Absolute: 2.7 10*3/uL (ref 0.7–3.1)
Lymphs: 46 %
MCH: 23.8 pg — ABNORMAL LOW (ref 26.6–33.0)
MCHC: 30.4 g/dL — ABNORMAL LOW (ref 31.5–35.7)
MCV: 78 fL — ABNORMAL LOW (ref 79–97)
Monocytes Absolute: 0.5 10*3/uL (ref 0.1–0.9)
Monocytes: 9 %
Neutrophils Absolute: 2.5 10*3/uL (ref 1.4–7.0)
Neutrophils: 43 %
Platelets: 301 10*3/uL (ref 150–450)
RBC: 5.09 x10E6/uL (ref 3.77–5.28)
RDW: 14 % (ref 11.7–15.4)
WBC: 5.8 10*3/uL (ref 3.4–10.8)

## 2019-06-24 LAB — FERRITIN: Ferritin: 52 ng/mL (ref 15–150)

## 2019-06-27 ENCOUNTER — Other Ambulatory Visit: Payer: Self-pay

## 2019-06-28 ENCOUNTER — Other Ambulatory Visit: Payer: Self-pay

## 2019-06-28 ENCOUNTER — Encounter: Payer: Self-pay | Admitting: Internal Medicine

## 2019-06-28 ENCOUNTER — Ambulatory Visit (INDEPENDENT_AMBULATORY_CARE_PROVIDER_SITE_OTHER): Payer: Managed Care, Other (non HMO) | Admitting: Internal Medicine

## 2019-06-28 VITALS — BP 136/90 | HR 71 | Temp 99.1°F | Ht 61.25 in | Wt 235.4 lb

## 2019-06-28 DIAGNOSIS — E119 Type 2 diabetes mellitus without complications: Secondary | ICD-10-CM | POA: Diagnosis not present

## 2019-06-28 DIAGNOSIS — K219 Gastro-esophageal reflux disease without esophagitis: Secondary | ICD-10-CM

## 2019-06-28 DIAGNOSIS — F439 Reaction to severe stress, unspecified: Secondary | ICD-10-CM

## 2019-06-28 DIAGNOSIS — I10 Essential (primary) hypertension: Secondary | ICD-10-CM

## 2019-06-28 DIAGNOSIS — Z1239 Encounter for other screening for malignant neoplasm of breast: Secondary | ICD-10-CM

## 2019-06-28 DIAGNOSIS — F411 Generalized anxiety disorder: Secondary | ICD-10-CM

## 2019-06-28 DIAGNOSIS — E559 Vitamin D deficiency, unspecified: Secondary | ICD-10-CM

## 2019-06-28 DIAGNOSIS — D509 Iron deficiency anemia, unspecified: Secondary | ICD-10-CM

## 2019-06-28 DIAGNOSIS — Z0000 Encounter for general adult medical examination without abnormal findings: Secondary | ICD-10-CM | POA: Diagnosis not present

## 2019-06-28 DIAGNOSIS — F331 Major depressive disorder, recurrent, moderate: Secondary | ICD-10-CM

## 2019-06-28 DIAGNOSIS — Z9109 Other allergy status, other than to drugs and biological substances: Secondary | ICD-10-CM

## 2019-06-28 NOTE — Progress Notes (Signed)
Patient ID: Shirley [name], female   DOB: [birth year], 38 y.o.   MRN: [id number]   Subjective:    Patient ID: Shirley [name], female    DOB: [birth year], 38 y.o.   MRN: [id number]  HPI  Patient here for her physical exam.  She reports she is doing better.  Feels better.  Handling stress.  Work stressful, but she reports handling things well.  No chest pain.  No sob.  No acid reflux.  No abdominal pain.  Bowels moving.  Is followed by Dr Letta Median for diabetes.  Brought in no sugar readings.  Discussed low carb diet and exercise.  Sees Dr Donneta Romberg for her allergies.  Stable.  Followed by Dr Nicolasa Ducking for stress/anxiety.  Overall doing better.     Past Medical History:  Diagnosis Date  . Anemia   . Asthma    WELL CONTROLLED  . Complication of anesthesia    -TACYCARDIA AFTER GASTRIC SURGERY IN 2016 AND HAD TO STAY 5 DAYS-itching after surgery 2005, 2006  . Depression   . Diabetes mellitus ([place])   . Fatty liver   . GERD (gastroesophageal reflux disease)   . Heart murmur   . History of dermoid cyst excision   . History of hiatal hernia   . Hypertension    H/O  . IBS (irritable bowel syndrome)   . Migraine   . PONV (postoperative nausea and vomiting)    nausea   Past Surgical History:  Procedure Laterality Date  . CHOLECYSTECTOMY  2005  . DILATATION & CURETTAGE/HYSTEROSCOPY WITH MYOSURE N/A 03/30/2018   Procedure: DILATATION & CURETTAGE/HYSTEROSCOPY;  Surgeon: Homero Fellers, MD;  Location: [facility] ORS;  Service: Gynecology;  Laterality: N/A;  . LAPAROSCOPIC GASTRIC RESTRICTIVE DUODENAL PROCEDURE (DUODENAL SWITCH) N/A 08/28/2015   Procedure: LAPAROSCOPIC GASTRIC RESTRICTIVE DUODENAL PROCEDURE (DUODENAL SWITCH);  Surgeon: Ladora Daniel, MD;  Location: [facility] ORS;  Service: General;  Laterality: N/A;  . 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 Hypertension Mother   . Diabetes Mother   . Asthma Brother   . Breast cancer Neg Hx   . Colon  cancer Neg Hx    Social History   Socioeconomic History  . Marital status: Single    Spouse name: Not on file  . Number of children: 0  . Years of education: Not on file  . Highest education level: Not on file  Occupational History    Employer: [employer]  . Financial resource strain: Not on file  . Food insecurity    Worry: Not on file    Inability: Not on file  . Transportation needs    Medical: Not on file    Non-medical: Not on file  Tobacco Use  . Smoking status: Never Smoker  . Smokeless tobacco: Never Used  Substance and Sexual Activity  . Alcohol use: Yes    Alcohol/week: 0.0 standard drinks    Comment: social  . Drug use: No  . Sexual activity: Not Currently    Birth control/protection: None  Lifestyle  . Physical activity    Days per week: 1 day    Minutes per session: 30 min  . Stress: Very much  Relationships  . Social Herbalist on phone: Not on file    Gets together: Not on file    Attends religious service: Not on file    Active member of club or organization: Not on  file    Attends meetings of clubs or organizations: Not on file    Relationship status: Not on file  Other Topics Concern  . Not on file  Social History Narrative  . Not on file    Outpatient Encounter Medications as of 06/28/2019  Medication Sig  . albuterol (PROVENTIL HFA;VENTOLIN HFA) 108 (90 Base) MCG/ACT inhaler Inhale 2 puffs into the lungs every 6 (six) hours as needed for wheezing or shortness of breath. Reported on 05/01/2016  . azelastine (OPTIVAR) 0.05 % ophthalmic solution Place 1 drop into both eyes 2 (two) times daily as needed (for allergic eye). Reported on 05/01/2016  . CALCIUM CITRATE-VITAMIN D3 PO Take 1 tablet by mouth 3 (three) times a week.  . citalopram (CELEXA) 20 MG tablet   . clindamycin (CLEOCIN T) 1 % external solution Apply 1 application topically 2 (two) times daily as needed (applied to breast, groin, and underarms as needed.).  Marland Kitchen  clindamycin (CLEOCIN) 300 MG capsule Take 300 mg by mouth See admin instructions. Take 1 capsule (300 mg) daily for 2 weeks as needed HS  . Fe Fum-FePoly-Vit C-Vit B3 (INTEGRA) 62.5-62.5-40-3 MG CAPS One capsule per day  . glucose blood (PRECISION QID TEST) test strip Use 1 strip  twice a day  [dx 250.02]  . ketoconazole (NIZORAL) 2 % shampoo Apply 1 application topically 3 (three) times a week. Use 2-3 times a week. Leave in for 5 minutes before rinsing out  . lamoTRIgine (LAMICTAL) 200 MG tablet Take 200 mg by mouth daily.  Marland Kitchen levocetirizine (XYZAL) 5 MG tablet Take 1 tablet (5 mg total) by mouth every evening. Reported on 05/01/2016  . metFORMIN (GLUCOPHAGE-XR) 500 MG 24 hr tablet TAKE 1 TABLET(500 MG) BY MOUTH TWICE DAILY WITH A MEAL  . montelukast (SINGULAIR) 10 MG tablet Take 10 mg by mouth at bedtime.  . Olopatadine HCl (PATANASE) 0.6 % SOLN Place 1 spray into the nose daily as needed (for allergies.).  Marland Kitchen pantoprazole (PROTONIX) 40 MG tablet Take 1 tablet (40 mg total) by mouth every morning.  . rifampin (RIFADIN) 300 MG capsule Take 300 mg by mouth See admin instructions. Take 1 capsule (300 mg) by mouth daily for 2 weeks for HS  . traZODone (DESYREL) 150 MG tablet Take 1 tablet (150 mg total) by mouth at bedtime.  . triamcinolone ointment (KENALOG) 0.1 % Apply 1 application topically 2 (two) times daily as needed (applied to affected areas of scalp).  Grant Ruts INHUB 250-50 MCG/DOSE AEPB INL 1 PUFF PO BID  . [DISCONTINUED] dapagliflozin propanediol (FARXIGA) 5 MG TABS tablet Take 5 mg by mouth daily. (Patient not taking: Reported on 06/28/2019)  . [DISCONTINUED] fluticasone (FLOVENT HFA) 110 MCG/ACT inhaler Inhale 2 puffs into the lungs every morning.    Facility-Administered Encounter Medications as of 06/28/2019  Medication  . polyethylene glycol (MIRALAX / GLYCOLAX) packet 17 g    Review of Systems  Constitutional: Negative for appetite change and unexpected weight change.  HENT: Negative  for congestion and sinus pressure.   Eyes: Negative for pain and visual disturbance.  Respiratory: Negative for cough, chest tightness and shortness of breath.   Cardiovascular: Negative for chest pain, palpitations and leg swelling.  Gastrointestinal: Negative for abdominal pain, diarrhea, nausea and vomiting.  Genitourinary: Negative for difficulty urinating and dysuria.  Musculoskeletal: Negative for joint swelling and myalgias.  Skin: Negative for color change and rash.  Neurological: Negative for dizziness, light-headedness and headaches.  Hematological: Negative for adenopathy. Does not bruise/bleed easily.  Psychiatric/Behavioral: Negative for agitation and dysphoric mood.       Objective:    Physical Exam Constitutional:      General: She is not in acute distress.    Appearance: Normal appearance. She is well-developed.  HENT:     Right Ear: External ear normal.     Left Ear: External ear normal.  Eyes:     General: No scleral icterus.       Right eye: No discharge.        Left eye: No discharge.     Conjunctiva/sclera: Conjunctivae normal.  Neck:     Musculoskeletal: Neck supple. No muscular tenderness.     Thyroid: No thyromegaly.  Cardiovascular:     Rate and Rhythm: Normal rate and regular rhythm.  Pulmonary:     Effort: No tachypnea, accessory muscle usage or respiratory distress.     Breath sounds: Normal breath sounds. No decreased breath sounds or wheezing.  Chest:     Breasts:        Right: No inverted nipple, mass, nipple discharge or tenderness (no axillary adenopathy).        Left: No inverted nipple, mass, nipple discharge or tenderness (no axilarry adenopathy).  Abdominal:     General: Bowel sounds are normal.     Palpations: Abdomen is soft.     Tenderness: There is no abdominal tenderness.  Musculoskeletal:        General: No swelling or tenderness.  Lymphadenopathy:     Cervical: No cervical adenopathy.  Skin:    General: Skin is warm.      Findings: No erythema or rash.  Neurological:     Mental Status: She is alert and oriented to person, place, and time.  Psychiatric:        Mood and Affect: Mood normal.        Behavior: Behavior normal.     BP 136/90   Pulse 71   Temp 99.1 F (37.3 C)   Ht 5' 1.25" (1.556 m)   Wt 235 lb 6.4 oz (106.8 kg)   SpO2 98%   BMI 44.12 kg/m  Wt Readings from Last 3 Encounters:  06/28/19 235 lb 6.4 oz (106.8 kg)  02/11/19 240 lb 3.2 oz (109 kg)  01/13/19 238 lb (108 kg)     Lab Results  Component Value Date   WBC 5.8 06/23/2019   HGB 12.1 06/23/2019   HCT 39.8 06/23/2019   PLT 301 06/23/2019   GLUCOSE 102 (H) 02/03/2019   CHOL 153 02/03/2019   TRIG 49 02/03/2019   HDL 67 02/03/2019   LDLCALC 76 02/03/2019   ALT 14 02/03/2019   AST 16 02/03/2019   NA 140 02/03/2019   K 4.3 02/03/2019   CL 102 02/03/2019   CREATININE 0.83 02/03/2019   BUN 13 02/03/2019   CO2 23 02/03/2019   TSH 1.420 06/15/2018   HGBA1C 6.5 (H) 02/03/2019       Assessment & Plan:   Problem List Items Addressed This Visit    Anemia    Follow cbc and ferritin.        Diabetes mellitus without complication (HCC)    Low carb diet and exercise.  Check met b and a1c with next labs.  Followed by Dr Letta Median.        Relevant Orders   Hemoglobin A1c   Lipid panel   Basic metabolic panel   Environmental allergies    Followed by Dr Donneta Romberg.  Stable on current regimen.  Generalized anxiety disorder    Doing well on current regimen.  Followed by psychiatry.        GERD (gastroesophageal reflux disease)    Controlled.        Hypertension    Blood pressure has been under good control.  Slight increase today.  Have her spot check her pressure.  Send in readings.  Check metabolic panel.        Relevant Orders   Hepatic function panel   TSH   Major depressive disorder, recurrent episode, moderate ([place])    Followed by psychiatry.  Doing well on current regimen.        Severe obesity (BMI >=  40) (HCC)    Discussed diet and exercise.  Follow.        Stress    Increased stress as outlined.  Reports handling stress relatively well.  Follow.  Continue f/u with psychiatry.        Vitamin D deficiency    Follow vitamin D level.        Other Visit Diagnoses    Breast cancer screening    -  Primary   Relevant Orders   MM 3D SCREEN BREAST BILATERAL       Einar Pheasant, MD

## 2019-06-30 ENCOUNTER — Encounter: Payer: Self-pay | Admitting: Internal Medicine

## 2019-07-03 ENCOUNTER — Encounter: Payer: Self-pay | Admitting: Internal Medicine

## 2019-07-03 NOTE — Assessment & Plan Note (Signed)
Doing well on current regimen. Followed by psychiatry. 

## 2019-07-03 NOTE — Assessment & Plan Note (Signed)
Controlled.  

## 2019-07-03 NOTE — Assessment & Plan Note (Signed)
Blood pressure has been under good control.  Slight increase today.  Have her spot check her pressure.  Send in readings.  Check metabolic panel.

## 2019-07-03 NOTE — Assessment & Plan Note (Signed)
Discussed diet and exercise.  Follow.  

## 2019-07-03 NOTE — Assessment & Plan Note (Signed)
Increased stress as outlined.  Reports handling stress relatively well.  Follow.  Continue f/u with psychiatry.

## 2019-07-03 NOTE — Assessment & Plan Note (Signed)
Followed by Dr Donneta Romberg.  Stable on current regimen.

## 2019-07-03 NOTE — Assessment & Plan Note (Signed)
Follow vitamin D level.  

## 2019-07-03 NOTE — Assessment & Plan Note (Signed)
Low carb diet and exercise.  Check met b and a1c with next labs.  Followed by Dr Letta Median.

## 2019-07-03 NOTE — Assessment & Plan Note (Signed)
Followed by psychiatry.  Doing well on current regimen.   

## 2019-07-03 NOTE — Assessment & Plan Note (Signed)
Follow cbc and ferritin.  

## 2019-07-05 ENCOUNTER — Telehealth: Payer: Self-pay

## 2019-07-05 ENCOUNTER — Encounter: Payer: Self-pay | Admitting: Internal Medicine

## 2019-07-05 LAB — LIPID PANEL
Chol/HDL Ratio: 2.3 ratio (ref 0.0–4.4)
Cholesterol, Total: 152 mg/dL (ref 100–199)
HDL: 66 mg/dL (ref >39)
LDL Calculated: 70 mg/dL (ref 0–99)
Triglycerides: 79 mg/dL (ref 0–149)
VLDL Cholesterol Cal: 16 mg/dL (ref 5–40)

## 2019-07-05 LAB — BASIC METABOLIC PANEL
BUN/Creatinine Ratio: 13 (ref 9–23)
BUN: 11 mg/dL (ref 6–24)
CO2: 20 mmol/L (ref 20–29)
Calcium: 9 mg/dL (ref 8.7–10.2)
Chloride: 98 mmol/L (ref 96–106)
Creatinine, Ser: 0.85 mg/dL (ref 0.57–1.00)
GFR calc Af Amer: 99 mL/min/{1.73_m2} (ref >59)
GFR calc non Af Amer: 86 mL/min/{1.73_m2} (ref >59)
Glucose: 103 mg/dL — ABNORMAL HIGH (ref 65–99)
Potassium: 4.3 mmol/L (ref 3.5–5.2)
Sodium: 136 mmol/L (ref 134–144)

## 2019-07-05 LAB — HEPATIC FUNCTION PANEL
ALT: 11 IU/L (ref 0–32)
AST: 15 IU/L (ref 0–40)
Albumin: 4.2 g/dL (ref 3.8–4.8)
Alkaline Phosphatase: 94 IU/L (ref 39–117)
Bilirubin Total: 0.5 mg/dL (ref 0.0–1.2)
Bilirubin, Direct: 0.16 mg/dL (ref 0.00–0.40)
Total Protein: 6.9 g/dL (ref 6.0–8.5)

## 2019-07-05 LAB — TSH: TSH: 2.04 u[IU]/mL (ref 0.450–4.500)

## 2019-07-05 LAB — HEMOGLOBIN A1C
Est. average glucose Bld gHb Est-mCnc: 146 mg/dL
Hgb A1c MFr Bld: 6.7 % — ABNORMAL HIGH (ref 4.8–5.6)

## 2019-07-05 NOTE — Telephone Encounter (Signed)
Pt scheduled. Letter mailed.

## 2019-07-05 NOTE — Telephone Encounter (Signed)
-----   Message from Einar Pheasant, MD sent at 07/03/2019  7:19 AM EDT ----- Regarding: schedule mammogram Needs mammogram scheduled.  Order is in system.  Thanks    Dr Nicki Reaper

## 2019-07-18 ENCOUNTER — Encounter: Payer: Self-pay | Admitting: Internal Medicine

## 2019-07-18 ENCOUNTER — Other Ambulatory Visit: Payer: Self-pay

## 2019-07-18 MED ORDER — PANTOPRAZOLE SODIUM 40 MG PO TBEC: 40.0000 mg | DELAYED_RELEASE_TABLET | ORAL | 1 refills | Status: DC

## 2019-07-28 ENCOUNTER — Other Ambulatory Visit: Payer: Self-pay

## 2019-08-02 ENCOUNTER — Ambulatory Visit: Payer: Managed Care, Other (non HMO) | Admitting: Internal Medicine

## 2019-08-02 ENCOUNTER — Other Ambulatory Visit: Payer: Self-pay

## 2019-08-02 ENCOUNTER — Encounter: Payer: Self-pay | Admitting: Internal Medicine

## 2019-08-02 VITALS — BP 120/70 | HR 72 | Ht 61.25 in | Wt 239.0 lb

## 2019-08-02 DIAGNOSIS — R809 Proteinuria, unspecified: Secondary | ICD-10-CM | POA: Diagnosis not present

## 2019-08-02 DIAGNOSIS — E119 Type 2 diabetes mellitus without complications: Secondary | ICD-10-CM | POA: Diagnosis not present

## 2019-08-02 MED ORDER — RYBELSUS 7 MG PO TABS: 1.0000 | ORAL_TABLET | Freq: Every day | ORAL | 5 refills | Status: DC

## 2019-08-02 NOTE — Patient Instructions (Addendum)
Please  continue: - Metformin ER 500 mg 2x a day  Please start: - Rybelsus 3 mg daily before b'fast for 2 weeks and then increase to 2 tablets a day for another 2 weeks.  Let me know when you finish the tablet so I can call in a prescription for the 7 mg daily.  Please come back for a follow-up appointment in 4 months.

## 2019-08-02 NOTE — Progress Notes (Signed)
Patient ID: Shirley [name], female   DOB: 24-Jul-1981, 38 y.o.   MRN: [id number]  HPI: Shirley [name] is a 38 y.o.-year-old female, presenting for f/u for DM2, dx 2005, non-insulin-dependent, uncontrolled, with complications ([facility]). Last visit 4 months ago (virtual).  Last hemoglobin A1c was: Lab Results  Component Value Date   HGBA1C 6.7 (H) 07/04/2019   HGBA1C 6.5 (H) 02/03/2019   HGBA1C 6.5 (A) 12/06/2018  07/15/2015: 7.1% 07/13/2014: 7.2%  - at work 01/2014: 7.8% 08/2013: 7.2%  She is now on: - Metformin ER 500 mg twice a day - missed evening doses  Previous regimen: - Farxiga 5 mg daily-tried 03/2019 >> stopped because of urinary frequency, dizziness, dehydration - Januvia 100 mg in am - Metformin XR (osm) 1000 mg tabs 2x a day with meals >> some loose stools - Invokana 100 -tried 03/2014 She was on Janumet XR 1000-100 mg po with dinner (used to cut them in half to swallow them better!!!) She was on Metformin in the past >> diarrhea at max dose  She checks her sugars 0 to once a day: - am: 122-130, 147  >> 119, 135, 143 >> 96-122 >> 122-146 - 2h after b'fast: 110-120s, 145 >> 149 >> n/c >> 176, 213 - before lunch: 104-149, 161 >> n/c >> 109 >> 114-154 >> n/c - 2h after lunch:130 >> 144, 191, 220 >> 113-135, 176, 206 >> 176 - before dinner: 131-162 >> 100-136 >> n/c - 2h after dinner: 120s >> n/c >> 137-176, 228-257 (steroids) >> 154 - bedtime: n/c >> 132 - nighttime: n/c >> 168 >> n/c Lowest sugar was 109 >> 98 >> 122; it is unclear at which level she has hypoglycemia awareness Highest sugar was 220 (3 bananas) >> 257 >> 213.  -No CKD, last BUN/creatinine: Lab Results  Component Value Date   BUN 11 07/04/2019   CREATININE 0.85 07/04/2019  Off losartan.  Normal ACR: Lab Results  Component Value Date   MICRALBCREAT 11.2 07/31/2016   MICRALBCREAT 12.1 08/10/2015   -No HL: Lab Results  Component Value Date   CHOL 152 07/04/2019   HDL 66 07/04/2019   LDLCALC  70 07/04/2019   TRIG 79 07/04/2019   CHOLHDL 2.3 07/04/2019  Not on a statin. - last eye exam was in 12/2018: No DR - no numbness and tingling in her feet.  She also has a history of vitamin D deficiency (on vitamin D 5000 units daily), HTN, GERD.   She has a history of gastric bypass (sleeve gastrectomy) in 08/2015. She did have complications after the surgery: Hypoglycemia and hypotension, both resolved.  Her diabetes dramatically improved after the surgery (she also lost ~50-60 pounds) and she could come off all of the medicines.  Since then, sugars worsened and we restarted metformin ER in 02/2018. In the past, she had GERD and abdominal pain.  She saw her bariatric surgeon and the barium swallow showed abnormal stomach architecture.  She was told she may need a revision surgery.  However, she was started on acid reflux medicines and her symptoms improved significantly.  ROS: Constitutional: no weight gain/no weight loss, no fatigue, no subjective hyperthermia, no subjective hypothermia Eyes: no blurry vision, no xerophthalmia ENT: no sore throat, no nodules palpated in neck, no dysphagia, no odynophagia, no hoarseness Cardiovascular: no CP/no SOB/no palpitations/no leg swelling Respiratory: no cough/no SOB/no wheezing Gastrointestinal: no N/no V/no D/no C/no acid reflux Musculoskeletal: no muscle aches/no joint aches Skin: no rashes, no hair loss Neurological: no tremors/no  numbness/no tingling/no dizziness  I reviewed pt's medications, allergies, PMH, social hx, family hx, and changes were documented in the history of present illness. Otherwise, unchanged from my initial visit note.  Past Medical History:  Diagnosis Date  . Anemia   . Asthma    WELL CONTROLLED  . Complication of anesthesia    -TACYCARDIA AFTER GASTRIC SURGERY IN 2016 AND HAD TO STAY 5 DAYS-itching after surgery 2005, 2006  . Depression   . Diabetes mellitus (HCC)   . Fatty liver   . GERD (gastroesophageal  reflux disease)   . Heart murmur   . History of dermoid cyst excision   . History of hiatal hernia   . Hypertension    H/O  . IBS (irritable bowel syndrome)   . Migraine   . PONV (postoperative nausea and vomiting)    nausea   Past Surgical History:  Procedure Laterality Date  . CHOLECYSTECTOMY  2005  . DILATATION & CURETTAGE/HYSTEROSCOPY WITH MYOSURE N/A 03/30/2018   Procedure: DILATATION & CURETTAGE/HYSTEROSCOPY;  Surgeon: Natale MilchSchuman, Christanna R, MD;  Location: [facility];  Service: Gynecology;  Laterality: N/A;  . LAPAROSCOPIC GASTRIC RESTRICTIVE DUODENAL PROCEDURE (DUODENAL SWITCH) N/A 08/28/2015   Procedure: LAPAROSCOPIC GASTRIC RESTRICTIVE DUODENAL PROCEDURE (DUODENAL SWITCH);  Surgeon: Everette RankMichael A Tyner, MD;  Location: [facility];  Service: General;  Laterality: N/A;  . 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 Marital status: Single    Spouse name: Not on file  . Number of children: 0  . Years of education: Not on file  . Highest education level: Not on file  Occupational History    Employer: [employer]  Social Needs  . Financial resource strain: Not on file  . Food insecurity    Worry: Not on file    Inability: Not on file  . Transportation needs    Medical: Not on file    Non-medical: Not on file  Tobacco Use  . Smoking status: Never Smoker  . Smokeless tobacco: Never Used  Substance and Sexual Activity  . Alcohol use: Yes    Alcohol/week: 0.0 standard drinks    Comment: social  . Drug use: No  . Sexual activity: Not Currently    Birth control/protection: None  Lifestyle  . Physical activity    Days per week: 1 day    Minutes per session: 30 min  . Stress: Very much  Relationships  . Social Musicianconnections    Talks on phone: Not on file    Gets together: Not on file    Attends religious service: Not on file    Active member of club or organization: Not on file    Attends meetings of clubs or organizations: Not on file     Relationship status: Not on file  . Intimate partner violence    Fear of current or ex partner: Not on file    Emotionally abused: Not on file    Physically abused: Not on file    Forced sexual activity: Not on file  Other Topics Concern  . Not on file  Social History Narrative  . Not on file   Current Outpatient Medications on File Prior to Visit  Medication Sig Dispense Refill  . albuterol (PROVENTIL HFA;VENTOLIN HFA) 108 (90 Base) MCG/ACT inhaler Inhale 2 puffs into the lungs every 6 (six) hours as needed for wheezing or shortness of breath. Reported on 05/01/2016 1 Inhaler 1  . azelastine (OPTIVAR) 0.05 % ophthalmic solution  Place 1 drop into both eyes 2 (two) times daily as needed (for allergic eye). Reported on 05/01/2016    . CALCIUM CITRATE-VITAMIN D3 PO Take 1 tablet by mouth 3 (three) times a week.    . citalopram (CELEXA) 20 MG tablet     . clindamycin (CLEOCIN T) 1 % external solution Apply 1 application topically 2 (two) times daily as needed (applied to breast, groin, and underarms as needed.).    Marland Kitchen. clindamycin (CLEOCIN) 300 MG capsule Take 300 mg by mouth See admin instructions. Take 1 capsule (300 mg) daily for 2 weeks as needed HS    . Fe Fum-FePoly-Vit C-Vit B3 (INTEGRA) 62.5-62.5-40-3 MG CAPS One capsule per day 30 capsule 3  . glucose blood (PRECISION QID TEST) test strip Use 1 strip  twice a day  [dx 250.02]    . ketoconazole (NIZORAL) 2 % shampoo Apply 1 application topically 3 (three) times a week. Use 2-3 times a week. Leave in for 5 minutes before rinsing out    . lamoTRIgine (LAMICTAL) 200 MG tablet Take 200 mg by mouth daily.    Marland Kitchen. levocetirizine (XYZAL) 5 MG tablet Take 1 tablet (5 mg total) by mouth every evening. Reported on 05/01/2016 30 tablet 1  . metFORMIN (GLUCOPHAGE-XR) 500 MG 24 hr tablet TAKE 1 TABLET(500 MG) BY MOUTH TWICE DAILY WITH A MEAL 180 tablet 3  . montelukast (SINGULAIR) 10 MG tablet Take 10 mg by mouth at bedtime.    . Olopatadine HCl (PATANASE) 0.6  % SOLN Place 1 spray into the nose daily as needed (for allergies.). 1 Bottle 1  . pantoprazole (PROTONIX) 40 MG tablet Take 1 tablet (40 mg total) by mouth every morning. 90 tablet 1  . rifampin (RIFADIN) 300 MG capsule Take 300 mg by mouth See admin instructions. Take 1 capsule (300 mg) by mouth daily for 2 weeks for HS    . traZODone (DESYREL) 150 MG tablet Take 1 tablet (150 mg total) by mouth at bedtime. 90 tablet 1  . triamcinolone ointment (KENALOG) 0.1 % Apply 1 application topically 2 (two) times daily as needed (applied to affected areas of scalp).    Monte Fantasia. WIXELA INHUB 250-50 MCG/DOSE AEPB INL 1 PUFF PO BID     Current Facility-Administered Medications on File Prior to Visit  Medication Dose Route Frequency Provider Last Rate Last Dose  . polyethylene glycol (MIRALAX / GLYCOLAX) packet 17 g  17 g Oral Daily Schuman, Christanna R, MD       Allergies  Allergen Reactions  . Ibuprofen Other (See Comments)    Upset stomach   . Caffeine Other (See Comments)    Stomach issues   Family History  Problem Relation Age of Onset  . Hypertension Mother   . Diabetes Mother   . Asthma Brother   . Breast cancer Neg Hx   . Colon cancer Neg Hx     PE: BP 120/70   Pulse 72   Ht 5' 1.25" (1.556 m)   Wt 239 lb (108.4 kg)   SpO2 99%   BMI 44.79 kg/m  Body mass index is 44.12 kg/m. Wt Readings from Last 3 Encounters:  08/02/19 239 lb (108.4 kg)  06/28/19 235 lb 6.4 oz (106.8 kg)  02/11/19 240 lb 3.2 oz (109 kg)  : Constitutional: overweight, in NAD Eyes: PERRLA, EOMI, no exophthalmos ENT: moist mucous membranes, no thyromegaly, no cervical lymphadenopathy Cardiovascular: RRR, No MRG Respiratory: CTA B Gastrointestinal: abdomen soft, NT, ND, BS+ Musculoskeletal: no deformities, strength  intact in all 4 Skin: moist, warm, no rashes Neurological: no tremor with outstretched hands, DTR normal in all 4  ASSESSMENT: 1. DM2, non-insulin-dependent, uncontrolled, without complications -  [facility]U  2. Obesity class III  3.  Microalbuminuria  PLAN:  1. Patient with longstanding, uncontrolled, type 2 diabetes, greatly improved after gastric bypass surgery in 2016.  She was able to come off all of her medicines.  However, afterwards, we started her back on metformin as sugars increased.  She tried a Reuel Boom fast [employer] and she did very well on this.  However, she is now off the diet -Before last visit, HbA1c was 6.5%, at goal.  Last month she had another HbA1c which returned higher, at 6.7%. -At last visit, sugars are higher, with occasional spikes.  She had steroids before last visit with significant hypoglycemia and I advised her to let me know if she has to have them again, in which case we may need to use glipizide or even rapid acting insulin.  I also suggested a low dose Farxiga at last visit.  She started this but could not tolerate it due to dehydration.  We stopped this at the end of 03/2019. -At this visit, sugars are still slightly higher than goal with occasional spikes.  We discussed about adding a GLP-1 receptor agonist.  We will try to start her on the low-dose Rybelsus and increase as tolerated.  Discussed about mechanism of action and possible side effects.  I explained that we use this not only for her diabetes control but also for weight control and the fact that he can also help with cardiovascular and renal outcomes.  I also advised her to try to remember to take the metformin as she is not forgetting the evening dose most days of the week. - I advised her to: Patient Instructions  Please  continue: - Metformin ER 500 mg 2x a day  Please start: - Rybelsus 3 mg daily before b'fast for 2 weeks and then increase to 2 tablets a day for another 2 weeks.  Let me know when you finish the tablet so I can call in a prescription for the 7 mg daily.  Please come back for a follow-up appointment in 4 months.  - advised to check sugars at different times of the day - 1x a  day, rotating check times - advised for yearly eye exams >> she is UTD - return to clinic in 4 months      2.  Obesity class III -She is status post gastric sleeve surgery in 2016 -she gained 7 pounds before last visit -Unfortunately, we could not continue SGLT 2 inhibitor due to dehydration -We will add a GLP-1 receptor agonist, which should also help with weight loss  3. MAU -she has a h/o  microalbuminuria - at last OV, we started an SGLT 2 inhibitor, which should also help delay progression towards CKD, however, in 03/2018 due to dehydration. -We will need to repeat this at next visit  Carlus Pavlov, MD PhD [facility]

## 2019-08-04 ENCOUNTER — Ambulatory Visit
Admission: RE | Admit: 2019-08-04 | Discharge: 2019-08-04 | Disposition: A | Discharge status: Home / Self Care | Payer: Managed Care, Other (non HMO) | Source: Ambulatory Visit | Attending: Internal Medicine | Admitting: Internal Medicine

## 2019-08-04 DIAGNOSIS — Z1239 Encounter for other screening for malignant neoplasm of breast: Secondary | ICD-10-CM

## 2019-08-04 DIAGNOSIS — Z1231 Encounter for screening mammogram for malignant neoplasm of breast: Secondary | ICD-10-CM | POA: Diagnosis not present

## 2019-08-05 ENCOUNTER — Other Ambulatory Visit: Payer: Self-pay | Admitting: Internal Medicine

## 2019-08-05 DIAGNOSIS — N6310 Unspecified lump in the right breast, unspecified quadrant: Secondary | ICD-10-CM

## 2019-08-05 DIAGNOSIS — R928 Other abnormal and inconclusive findings on diagnostic imaging of breast: Secondary | ICD-10-CM

## 2019-08-15 ENCOUNTER — Ambulatory Visit
Admission: RE | Admit: 2019-08-15 | Discharge: 2019-08-15 | Disposition: A | Discharge status: Home / Self Care | Payer: Managed Care, Other (non HMO) | Source: Ambulatory Visit | Attending: Internal Medicine | Admitting: Internal Medicine

## 2019-08-15 DIAGNOSIS — N6310 Unspecified lump in the right breast, unspecified quadrant: Secondary | ICD-10-CM

## 2019-08-15 DIAGNOSIS — R928 Other abnormal and inconclusive findings on diagnostic imaging of breast: Secondary | ICD-10-CM | POA: Insufficient documentation

## 2019-08-25 ENCOUNTER — Encounter: Payer: Self-pay | Admitting: Internal Medicine

## 2019-08-25 MED ORDER — RYBELSUS 3 MG PO TABS: 3.0000 mg | ORAL_TABLET | Freq: Every day | ORAL | 4 refills | Status: DC

## 2019-09-07 ENCOUNTER — Other Ambulatory Visit: Payer: Self-pay | Admitting: Internal Medicine

## 2019-09-07 DIAGNOSIS — R928 Other abnormal and inconclusive findings on diagnostic imaging of breast: Secondary | ICD-10-CM

## 2019-09-07 NOTE — Progress Notes (Signed)
Order placed for f/u right breast mammogram and ultrasound  

## 2019-09-30 ENCOUNTER — Other Ambulatory Visit: Payer: Self-pay

## 2019-09-30 ENCOUNTER — Ambulatory Visit: Payer: Managed Care, Other (non HMO) | Admitting: Internal Medicine

## 2019-09-30 DIAGNOSIS — F331 Major depressive disorder, recurrent, moderate: Secondary | ICD-10-CM

## 2019-09-30 DIAGNOSIS — E559 Vitamin D deficiency, unspecified: Secondary | ICD-10-CM

## 2019-09-30 DIAGNOSIS — Z23 Encounter for immunization: Secondary | ICD-10-CM | POA: Diagnosis not present

## 2019-09-30 DIAGNOSIS — F411 Generalized anxiety disorder: Secondary | ICD-10-CM

## 2019-09-30 DIAGNOSIS — E119 Type 2 diabetes mellitus without complications: Secondary | ICD-10-CM | POA: Diagnosis not present

## 2019-09-30 DIAGNOSIS — D509 Iron deficiency anemia, unspecified: Secondary | ICD-10-CM

## 2019-09-30 DIAGNOSIS — Z9109 Other allergy status, other than to drugs and biological substances: Secondary | ICD-10-CM | POA: Diagnosis not present

## 2019-09-30 DIAGNOSIS — I10 Essential (primary) hypertension: Secondary | ICD-10-CM

## 2019-09-30 DIAGNOSIS — K219 Gastro-esophageal reflux disease without esophagitis: Secondary | ICD-10-CM

## 2019-09-30 NOTE — Progress Notes (Signed)
Subjective:    Patient ID: Shirley [name], female    DOB: December 07, 1980, 38 y.o.   MRN: [id number]  HPI  Patient here for a scheduled follow up.  She reports she is doing relatively well.  Seeing endocrinology for f/u of her diabetes.  Started on Rybelsus.  Tolerating.  Discussed low carb diet and exercise.  Trying to watch her diet.  No chest pain.  Breathing stable.  No acid reflux. No abdominal pain.  Bowels moving.  Increased stress with work.  Discussed with her today.  Seeing psychiatry.  She feels she is doing well on current medication regimen.     Past Medical History:  Diagnosis Date  . Anemia   . Asthma    WELL CONTROLLED  . Complication of anesthesia    -TACYCARDIA AFTER GASTRIC SURGERY IN 2016 AND HAD TO STAY 5 DAYS-itching after surgery 2005, 2006  . Depression   . Diabetes mellitus (HCC)   . Fatty liver   . GERD (gastroesophageal reflux disease)   . Heart murmur   . History of dermoid cyst excision   . History of hiatal hernia   . Hypertension    H/O  . IBS (irritable bowel syndrome)   . Migraine   . PONV (postoperative nausea and vomiting)    nausea   Past Surgical History:  Procedure Laterality Date  . CHOLECYSTECTOMY  2005  . DILATATION & CURETTAGE/HYSTEROSCOPY WITH MYOSURE N/A 03/30/2018   Procedure: DILATATION & CURETTAGE/HYSTEROSCOPY;  Surgeon: Natale Milch, MD;  Location: [facility] ORS;  Service: Gynecology;  Laterality: N/A;  . LAPAROSCOPIC GASTRIC RESTRICTIVE DUODENAL PROCEDURE (DUODENAL SWITCH) N/A 08/28/2015   Procedure: LAPAROSCOPIC GASTRIC RESTRICTIVE DUODENAL PROCEDURE (DUODENAL SWITCH);  Surgeon: [name], MD;  Location: [facility] ORS;  Service: General;  Laterality: N/A;  . 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 Hypertension Mother   . Diabetes Mother   . Asthma Brother   . Breast cancer Neg Hx   . Colon cancer Neg Hx    Social History   Socioeconomic History  . Marital status: Single     Spouse name: Not on file  . Number of children: 0  . Years of education: Not on file  . Highest education level: Not on file  Occupational History    Employer: [employer]  Social Needs  . Financial resource strain: Not on file  . Food insecurity    Worry: Not on file    Inability: Not on file  . Transportation needs    Medical: Not on file    Non-medical: Not on file  Tobacco Use  . Smoking status: Never Smoker  . Smokeless tobacco: Never Used  Substance and Sexual Activity  . Alcohol use: Yes    Alcohol/week: 0.0 standard drinks    Comment: social  . Drug use: No  . Sexual activity: Not Currently    Birth control/protection: None  Lifestyle  . Physical activity    Days per week: 1 day    Minutes per session: 30 min  . Stress: Very much  Relationships  . Social Musician on phone: Not on file    Gets together: Not on file    Attends religious service: Not on file    Active member of club or organization: Not on file    Attends meetings of clubs or organizations: Not on file    Relationship status: Not on file  Other Topics Concern  . Not on file  Social History Narrative  . Not on file    Outpatient Encounter Medications as of 09/30/2019  Medication Sig  . albuterol (PROVENTIL HFA;VENTOLIN HFA) 108 (90 Base) MCG/ACT inhaler Inhale 2 puffs into the lungs every 6 (six) hours as needed for wheezing or shortness of breath. Reported on 05/01/2016  . azelastine (OPTIVAR) 0.05 % ophthalmic solution Place 1 drop into both eyes 2 (two) times daily as needed (for allergic eye). Reported on 05/01/2016  . CALCIUM CITRATE-VITAMIN D3 PO Take 1 tablet by mouth 3 (three) times a week.  . citalopram (CELEXA) 20 MG tablet   . clindamycin (CLEOCIN T) 1 % external solution Apply 1 application topically 2 (two) times daily as needed (applied to breast, groin, and underarms as needed.).  Marland Kitchen clindamycin (CLEOCIN) 300 MG capsule Take 300 mg by mouth See admin instructions. Take 1  capsule (300 mg) daily for 2 weeks as needed HS  . glucose blood (PRECISION QID TEST) test strip Use 1 strip  twice a day  [dx 250.02]  . ketoconazole (NIZORAL) 2 % shampoo Apply 1 application topically 3 (three) times a week. Use 2-3 times a week. Leave in for 5 minutes before rinsing out  . lamoTRIgine (LAMICTAL) 100 MG tablet   . levocetirizine (XYZAL) 5 MG tablet Take 1 tablet (5 mg total) by mouth every evening. Reported on 05/01/2016  . metFORMIN (GLUCOPHAGE-XR) 500 MG 24 hr tablet TAKE 1 TABLET(500 MG) BY MOUTH TWICE DAILY WITH A MEAL  . montelukast (SINGULAIR) 10 MG tablet Take 10 mg by mouth at bedtime.  . Olopatadine HCl (PATANASE) 0.6 % SOLN Place 1 spray into the nose daily as needed (for allergies.).  Marland Kitchen pantoprazole (PROTONIX) 40 MG tablet Take 1 tablet (40 mg total) by mouth every morning.  . rifampin (RIFADIN) 300 MG capsule Take 300 mg by mouth See admin instructions. Take 1 capsule (300 mg) by mouth daily for 2 weeks for HS  . Semaglutide (RYBELSUS) 3 MG TABS Take 3 mg by mouth daily.  . traZODone (DESYREL) 50 MG tablet Take 1 tablet by mouth at bedtime.  . triamcinolone ointment (KENALOG) 0.1 % Apply 1 application topically 2 (two) times daily as needed (applied to affected areas of scalp).  Monte Fantasia INHUB 250-50 MCG/DOSE AEPB INL 1 PUFF PO BID  . [DISCONTINUED] Fe Fum-FePoly-Vit C-Vit B3 (INTEGRA) 62.5-62.5-40-3 MG CAPS One capsule per day  . [DISCONTINUED] lamoTRIgine (LAMICTAL) 200 MG tablet Take 200 mg by mouth daily.  . [DISCONTINUED] traZODone (DESYREL) 150 MG tablet Take 1 tablet (150 mg total) by mouth at bedtime.   Facility-Administered Encounter Medications as of 09/30/2019  Medication  . polyethylene glycol (MIRALAX / GLYCOLAX) packet 17 g   Review of Systems  Constitutional: Negative for appetite change and unexpected weight change.  HENT: Negative for congestion and sinus pressure.   Respiratory: Negative for cough, chest tightness and shortness of breath.    Cardiovascular: Negative for chest pain, palpitations and leg swelling.  Gastrointestinal: Negative for abdominal pain, diarrhea, nausea and vomiting.  Genitourinary: Negative for difficulty urinating and dysuria.  Musculoskeletal: Negative for joint swelling and myalgias.  Skin: Negative for color change and rash.  Neurological: Negative for dizziness, light-headedness and headaches.  Psychiatric/Behavioral: Negative for agitation and dysphoric mood.       Objective:    Physical Exam Constitutional:      General: She is not in acute distress.    Appearance: Normal appearance.  HENT:  Head: Normocephalic and atraumatic.     Right Ear: External ear normal.     Left Ear: External ear normal.  Eyes:     General: No scleral icterus.       Right eye: No discharge.        Left eye: No discharge.     Conjunctiva/sclera: Conjunctivae normal.  Neck:     Musculoskeletal: Neck supple. No muscular tenderness.     Thyroid: No thyromegaly.  Cardiovascular:     Rate and Rhythm: Normal rate and regular rhythm.  Pulmonary:     Effort: No respiratory distress.     Breath sounds: Normal breath sounds. No wheezing.  Abdominal:     General: Bowel sounds are normal.     Palpations: Abdomen is soft.     Tenderness: There is no abdominal tenderness.  Musculoskeletal:        General: No swelling or tenderness.  Lymphadenopathy:     Cervical: No cervical adenopathy.  Skin:    Findings: No erythema or rash.  Neurological:     Mental Status: She is alert.  Psychiatric:        Mood and Affect: Mood normal.        Behavior: Behavior normal.     BP 130/80   Pulse 82   Temp 97.9 F (36.6 C)   Resp 16   Wt 233 lb (105.7 kg)   SpO2 99%   BMI 43.67 kg/m  Wt Readings from Last 3 Encounters:  09/30/19 233 lb (105.7 kg)  08/02/19 239 lb (108.4 kg)  06/28/19 235 lb 6.4 oz (106.8 kg)     Lab Results  Component Value Date   WBC 5.8 06/23/2019   HGB 12.1 06/23/2019   HCT 39.8  06/23/2019   PLT 301 06/23/2019   GLUCOSE 103 (H) 07/04/2019   CHOL 152 07/04/2019   TRIG 79 07/04/2019   HDL 66 07/04/2019   LDLCALC 70 07/04/2019   ALT 11 07/04/2019   AST 15 07/04/2019   NA 136 07/04/2019   K 4.3 07/04/2019   CL 98 07/04/2019   CREATININE 0.85 07/04/2019   BUN 11 07/04/2019   CO2 20 07/04/2019   TSH 2.040 07/04/2019   HGBA1C 6.7 (H) 07/04/2019    [place] Breast Ltd Uni Right Inc Axilla  Result Date: 08/15/2019 CLINICAL DATA:  Recall from screening to evaluate a possible right breast mass. EXAM: DIGITAL DIAGNOSTIC right MAMMOGRAM WITH TOMO ULTRASOUND right BREAST COMPARISON:  Previous exam(s). ACR Breast Density Category b: There are scattered areas of fibroglandular density. FINDINGS: Additional images demonstrate an oval circumscribed 5 mm mass over the slightly inner upper right breast. Targeted ultrasound is performed, showing an oval circumscribed hypoechoic mass at the 12:30 position of the right breast 7 cm from the nipple measuring 4 x 4 x 5 mm. This has an appearance suggesting clustered microcysts or focus of apocrine metaplasia. IMPRESSION: Probable benign 5 mm mass over the 12:30 position of the right breast. RECOMMENDATION: Recommend a six-month follow-up diagnostic right breast ultrasound to document stability. I have discussed the findings and recommendations with the patient. If applicable, a reminder letter will be sent to the patient regarding the next appointment. BI-RADS CATEGORY  3: Probably benign. Electronically Signed   By: Elberta Fortisaniel  Boyle M.D.   On: 08/15/2019 15:24   Mm Diag Breast Tomo Uni Right  Result Date: 08/15/2019 CLINICAL DATA:  Recall from screening to evaluate a possible right breast mass. EXAM: DIGITAL DIAGNOSTIC right MAMMOGRAM WITH TOMO ULTRASOUND  right BREAST COMPARISON:  Previous exam(s). ACR Breast Density Category b: There are scattered areas of fibroglandular density. FINDINGS: Additional images demonstrate an oval circumscribed 5 mm  mass over the slightly inner upper right breast. Targeted ultrasound is performed, showing an oval circumscribed hypoechoic mass at the 12:30 position of the right breast 7 cm from the nipple measuring 4 x 4 x 5 mm. This has an appearance suggesting clustered microcysts or focus of apocrine metaplasia. IMPRESSION: Probable benign 5 mm mass over the 12:30 position of the right breast. RECOMMENDATION: Recommend a six-month follow-up diagnostic right breast ultrasound to document stability. I have discussed the findings and recommendations with the patient. If applicable, a reminder letter will be sent to the patient regarding the next appointment. BI-RADS CATEGORY  3: Probably benign. Electronically Signed   By: Marin Olp M.D.   On: 08/15/2019 15:24       Assessment & Plan:   Problem List Items Addressed This Visit    Anemia    Follow cbc and ferritin.       Relevant Orders   CBC with Differential/Platelet   Ferritin   Diabetes mellitus without complication ([place])    Followed by endocrinology.  Recently started rybelsus.  Tolerating.  Discussed low carb diet and exercise.  Follow.       Relevant Orders   Hemoglobin A1c   Hepatic function panel   Lipid panel   DM Microalbumin / creatinine urine ratio   Environmental allergies    Followed by Dr Donneta Romberg.  Stable.       Generalized anxiety disorder    Followed by psychiatry. Appears to be doing well on current medication regimen.        Relevant Medications   traZODone (DESYREL) 50 MG tablet   GERD (gastroesophageal reflux disease)    Controlled.       Hypertension    Blood pressure under good control.  Continue same medication regimen.  Follow pressures.  Follow metabolic panel.        Relevant Orders   Basic metabolic panel (today)   Major depressive disorder, recurrent episode, moderate ([place])    Followed by psychiatry. Doing well on current medication regimen.  Follow.       Relevant Medications   traZODone (DESYREL) 50  MG tablet   Severe obesity (BMI >= 40) (HCC)    Discussed diet and exercise.  Follow.       Vitamin D deficiency    Follow vitamin D level.        Other Visit Diagnoses    Need for immunization against influenza       Relevant Orders   Flu Vaccine QUAD 36+ mos IM (Completed)       Einar Pheasant, MD

## 2019-09-30 NOTE — Patient Instructions (Signed)
Get fasting labs at [facility] - in a couple of months.    Dr Avelino Leeds

## 2019-10-03 ENCOUNTER — Encounter: Payer: Self-pay | Admitting: Internal Medicine

## 2019-10-03 NOTE — Assessment & Plan Note (Signed)
Follow cbc and ferritin.  

## 2019-10-03 NOTE — Assessment & Plan Note (Signed)
Controlled.  

## 2019-10-03 NOTE — Assessment & Plan Note (Signed)
Followed by endocrinology.  Recently started rybelsus.  Tolerating.  Discussed low carb diet and exercise.  Follow.

## 2019-10-03 NOTE — Assessment & Plan Note (Signed)
Followed by psychiatry. Appears to be doing well on current medication regimen.

## 2019-10-03 NOTE — Assessment & Plan Note (Signed)
Blood pressure under good control.  Continue same medication regimen.  Follow pressures.  Follow metabolic panel.   

## 2019-10-03 NOTE — Assessment & Plan Note (Signed)
Followed by psychiatry.  Doing well on current medication regimen.  Follow.  

## 2019-10-03 NOTE — Assessment & Plan Note (Signed)
Followed by Dr Sharma.  Stable.  

## 2019-10-03 NOTE — Assessment & Plan Note (Signed)
Discussed diet and exercise.  Follow.  

## 2019-10-03 NOTE — Assessment & Plan Note (Signed)
Follow vitamin D level.  

## 2019-10-15 ENCOUNTER — Other Ambulatory Visit: Payer: Self-pay

## 2019-10-15 DIAGNOSIS — Z20822 Contact with and (suspected) exposure to covid-19: Secondary | ICD-10-CM

## 2019-10-17 ENCOUNTER — Ambulatory Visit: Payer: Managed Care, Other (non HMO) | Admitting: Obstetrics and Gynecology

## 2019-10-17 LAB — NOVEL CORONAVIRUS, NAA: SARS-CoV-2, NAA: NOT DETECTED

## 2019-10-24 ENCOUNTER — Telehealth: Payer: Self-pay

## 2019-10-24 NOTE — Telephone Encounter (Signed)
Rybelus 3 MG not covered by insurance.  Please advise.

## 2019-10-25 ENCOUNTER — Encounter: Payer: Self-pay | Admitting: Internal Medicine

## 2019-10-25 NOTE — Telephone Encounter (Signed)
If the PA is not approved, we may need to give her a sample box hopefully the coverage will change in the new year.

## 2019-10-26 NOTE — Telephone Encounter (Signed)
PA started

## 2019-10-28 ENCOUNTER — Telehealth: Payer: Self-pay

## 2019-10-28 NOTE — Telephone Encounter (Signed)
PA for Rybelsus 3 MG denied by insurance.  This request was denied because you did not meet the following clinical requirements:  Based on the information provided, you do not meet the established medication-specific criteria or guidelines for Rybelsus at this time.  Per your health plan's criteria, more than 60 tablets per 365 days is covered if you meet the following: One of the following: (a) The higher dose or quantity is supported in the drug labeling (dosage and administration section of the manufacturer's prescribing information). (b) The higher dose or quantity is supported by one of the following medical resources: (i) The [place]. (ii) DRUGDEX System by [employer]. The information provided does not show that you meet the criteria listed above. Please speak with your doctor about your choices. Reviewed by: CMRB, Pharm.D. This denial is based on our Rybelsus drug coverage policy, in addition to any supplementary information you or your prescriber may have submitted.

## 2019-10-28 NOTE — Telephone Encounter (Signed)
Let's fax them the 1st page of the package insert for Rybelsus, as discussed.

## 2019-10-31 NOTE — Telephone Encounter (Signed)
PA denied again.

## 2019-10-31 NOTE — Telephone Encounter (Signed)
I think it's a glitch   Let's call in the 7 mg tablet and see what happens. I do not see this on her med list

## 2019-10-31 NOTE — Telephone Encounter (Signed)
PA has been resent with the additional information.

## 2019-11-01 MED ORDER — RYBELSUS 7 MG PO TABS: 7.0000 mg | ORAL_TABLET | Freq: Every day | ORAL | 2 refills | Status: DC

## 2019-11-01 NOTE — Telephone Encounter (Signed)
7 MG RX sent.

## 2019-11-02 ENCOUNTER — Other Ambulatory Visit: Payer: Self-pay

## 2019-11-02 ENCOUNTER — Ambulatory Visit (INDEPENDENT_AMBULATORY_CARE_PROVIDER_SITE_OTHER): Payer: Managed Care, Other (non HMO)

## 2019-11-02 ENCOUNTER — Encounter: Payer: Self-pay | Admitting: Obstetrics and Gynecology

## 2019-11-02 ENCOUNTER — Ambulatory Visit (INDEPENDENT_AMBULATORY_CARE_PROVIDER_SITE_OTHER): Payer: Managed Care, Other (non HMO) | Admitting: Obstetrics and Gynecology

## 2019-11-02 DIAGNOSIS — N8311 Corpus luteum cyst of right ovary: Secondary | ICD-10-CM

## 2019-11-02 DIAGNOSIS — R102 Pelvic and perineal pain: Secondary | ICD-10-CM

## 2019-11-02 MED ORDER — NAPROXEN 500 MG PO TABS: 500.0000 mg | ORAL_TABLET | Freq: Two times a day (BID) | ORAL | 2 refills | Status: DC

## 2019-11-02 NOTE — Progress Notes (Signed)
Patient ID: Shirley [name], female   DOB: 07/18/1981, 38 y.o.   MRN: [id number]  Reason for Consult: Pelvic Pain (Pain in left ovary during ovulation and cramping, some discharge that is clear and thick no odor)   Referred by Einar Pheasant, MD  Subjective:     HPI:  Shirley [name] is a 38 y.o. female. She reports left sided pelvic pain She is seeing Dr. Nicolasa Ducking for depression/anxiety management Hx of left ruptured Dermoid. Pain always seems to occur during ovulatio.n  Gynecological History  Patient's last menstrual period was 10/13/2019 (exact date).   History of fibroids, polyps, or ovarian cysts? : no  History of PCOS? no Hstory of Endometriosis? no History of abnormal pap smears? no Have you had any sexually transmitted infections in the past? no    Past Medical History:  Diagnosis Date  . Anemia   . Asthma    WELL CONTROLLED  . Complication of anesthesia    -TACYCARDIA AFTER GASTRIC SURGERY IN 2016 AND HAD TO STAY 5 DAYS-itching after surgery 2005, 2006  . Depression   . Diabetes mellitus ([place])   . Fatty liver   . GERD (gastroesophageal reflux disease)   . Heart murmur   . History of dermoid cyst excision   . History of hiatal hernia   . Hypertension    H/O  . IBS (irritable bowel syndrome)   . Migraine   . PONV (postoperative nausea and vomiting)    nausea   Family History  Problem Relation Age of Onset  . Hypertension Mother   . Diabetes Mother   . Asthma Brother   . Breast cancer Neg Hx   . Colon cancer Neg Hx    Past Surgical History:  Procedure Laterality Date  . CHOLECYSTECTOMY  2005  . COLONOSCOPY WITH PROPOFOL N/A 01/23/2020   Procedure: COLONOSCOPY WITH PROPOFOL;  Surgeon: Lucilla Lame, MD;  Location: [facility] ENDOSCOPY;  Service: Endoscopy;  Laterality: N/A;  . DILATATION & CURETTAGE/HYSTEROSCOPY WITH MYOSURE N/A 03/30/2018   Procedure: DILATATION & CURETTAGE/HYSTEROSCOPY;  Surgeon: Homero Fellers, MD;  Location: [facility] ORS;  Service:  Gynecology;  Laterality: N/A;  . ESOPHAGOGASTRODUODENOSCOPY (EGD) WITH PROPOFOL N/A 01/23/2020   Procedure: ESOPHAGOGASTRODUODENOSCOPY (EGD) WITH PROPOFOL;  Surgeon: Lucilla Lame, MD;  Location: [facility] ENDOSCOPY;  Service: Endoscopy;  Laterality: N/A;  . GIVENS CAPSULE STUDY N/A 02/09/2020   Procedure: GIVENS CAPSULE STUDY;  Surgeon: Lucilla Lame, MD;  Location: [facility] ENDOSCOPY;  Service: Endoscopy;  Laterality: N/A;  . LAPAROSCOPIC GASTRIC RESTRICTIVE DUODENAL PROCEDURE (DUODENAL SWITCH) N/A 08/28/2015   Procedure: LAPAROSCOPIC GASTRIC RESTRICTIVE DUODENAL PROCEDURE (DUODENAL SWITCH);  Surgeon: Ladora Daniel, MD;  Location: [facility] ORS;  Service: General;  Laterality: N/A;  . 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 Smoking status: Never Smoker  . Smokeless tobacco: Never Used  Substance Use Topics  . Alcohol use: Not Currently    Alcohol/week: 0.0 standard drinks    Allergies  Allergen Reactions  . Ibuprofen Other (See Comments)    Upset stomach   . Caffeine Other (See Comments)    Stomach issues    Current Outpatient Medications  Medication Sig Dispense Refill  . albuterol (PROVENTIL HFA;VENTOLIN HFA) 108 (90 Base) MCG/ACT inhaler Inhale 2 puffs into the lungs every 6 (six) hours as needed for wheezing or shortness of breath. Reported on 05/01/2016 1 Inhaler 1  . azelastine (OPTIVAR) 0.05 % ophthalmic solution Place  1 drop into both eyes 2 (two) times daily as needed (for allergic eye). Reported on 05/01/2016    . clindamycin (CLEOCIN T) 1 % external solution Apply 1 application topically 2 (two) times daily as needed (applied to breast, groin, and underarms as needed.).    Marland Kitchen clindamycin (CLEOCIN) 300 MG capsule Take 300 mg by mouth See admin instructions. Take 1 capsule (300 mg) daily for 2 weeks as needed HS    . diclofenac Sodium (VOLTAREN) 1 % GEL Apply 2 g topically at bedtime.     Marland Kitchen ketoconazole (NIZORAL) 2 % shampoo Apply 1  application topically 3 (three) times a week. Use 2-3 times a week. Leave in for 5 minutes before rinsing out    . lamoTRIgine (LAMICTAL) 100 MG tablet 100 mg 2 (two) times daily.     Marland Kitchen levocetirizine (XYZAL) 5 MG tablet Take 1 tablet (5 mg total) by mouth every evening. Reported on 05/01/2016 30 tablet 1  . montelukast (SINGULAIR) 10 MG tablet Take 10 mg by mouth at bedtime.    . rifampin (RIFADIN) 300 MG capsule Take 300 mg by mouth See admin instructions. Take 1 capsule (300 mg) by mouth daily for 2 weeks for HS    . traZODone (DESYREL) 50 MG tablet Take 1 tablet by mouth at bedtime.    . triamcinolone ointment (KENALOG) 0.1 % Apply 1 application topically 2 (two) times daily as needed (applied to affected areas of scalp).    Grant Ruts INHUB 250-50 MCG/DOSE AEPB INL 1 PUFF PO BID    . ARIPiprazole (ABILIFY) 5 MG tablet Take 5 mg by mouth every morning.    . blood glucose meter kit and supplies Dispense based on insurance preference. Check glucose once daily 1 each 0  . Blood Glucose Monitoring Suppl (CONTOUR NEXT ONE) KIT 1 kit by Does not apply route See admin instructions. To check blood sugar once daily 1 kit 0  . glucose blood (CONTOUR NEXT TEST) test strip Use to check blood sugar once a day. 100 each 12  . medroxyPROGESTERone (DEPO-PROVERA) 150 MG/ML injection Inject 1 mL (150 mg total) into the muscle every 3 (three) months. 1 mL 3  . metFORMIN (GLUCOPHAGE-XR) 500 MG 24 hr tablet TAKE 1 TABLET(500 MG) BY MOUTH TWICE DAILY WITH A MEAL 180 tablet 3  . mupirocin ointment (BACTROBAN) 2 % Apply to affected area bid 22 g 0  . naproxen (NAPROSYN) 500 MG tablet TAKE 1 TABLET(500 MG) BY MOUTH TWICE DAILY WITH A MEAL AS NEEDED FOR PAIN 60 tablet 2  . Olopatadine HCl 0.6 % SOLN USE 1 SPRAY INTO THE NOSE DAILY AS NEEDED FOR ALLERGIES 30.5 g 0  . ondansetron (ZOFRAN) 4 MG tablet Take 1 tablet (4 mg total) by mouth every 6 (six) hours. 12 tablet 0  . pantoprazole (PROTONIX) 40 MG tablet TAKE 1 TABLET(40  MG) BY MOUTH EVERY MORNING 90 tablet 1  . Semaglutide (RYBELSUS) 7 MG TABS Take 1 tablet by mouth daily. 90 tablet 0  . sertraline (ZOLOFT) 100 MG tablet Take 100 mg by mouth daily.    . SUMAtriptan (IMITREX) 100 MG tablet Take 100 mg by mouth as directed.    . verapamil (CALAN-SR) 120 MG CR tablet TAKE 1 TABLET(120 MG) BY MOUTH DAILY 30 tablet 5  . Vitamin D, Ergocalciferol, (DRISDOL) 1.25 MG (50000 UNIT) CAPS capsule Take 1 capsule (50,000 Units total) by mouth every 7 (seven) days. 5 capsule 2   Current Facility-Administered Medications  Medication Dose Route  Frequency Provider Last Rate Last Admin  . polyethylene glycol (MIRALAX / GLYCOLAX) packet 17 g  17 g Oral Daily Danely Bayliss R, MD        REVIEW OF SYSTEMS      Objective:  Objective   Vitals:   11/02/19 0959  BP: 124/82  Temp: (!) 97.1 F (36.2 C)  Weight: 235 lb (106.6 kg)  Height: '5\' 1"'$  (1.549 m)   Body mass index is 44.4 kg/m.  Physical Exam  Assessment/Plan:     38 yo with pelvic pain Naproxen BID as needed for pain Follow up for a pelvic US.   More than 10 minutes were spent face to face with the patient in the room, reviewing the medical record, labs and images, and coordinating care for the patient. The plan of management was discussed in detail and counseling was provided.   Adrian Prows MD [facility], [facility] 11/02/2019 10:45 AM

## 2019-11-02 NOTE — Patient Instructions (Signed)
Mittelschmerz Mittelschmerz is a condition that affects women. It is pain in the abdomen that occurs between periods. The pain in this condition:  Affects one side of the abdomen. The side that it affects may change from month to month.  May be mild or severe.  May last from minutes to hours. It does not last longer than 1-2 days.  Can happen with nausea and light vaginal bleeding. Mittelschmerz is caused by the growth and release of an egg from an ovary, and it is a natural part of the ovulation cycle. It often happens about 2 weeks after a woman's period ends. Follow these instructions at home: Pay attention to any changes in your condition. Let your health care provider know about them. Take these actions to help with your pain:  Try soaking in a hot bath.  Try a heating pad to relax the muscles in the abdomen.  Take over-the-counter and prescription medicines only as told by your health care provider.  Keep all follow-up visits as told by your health care provider. This is important. Contact a health care provider if:  You have very bad pain most months.  You have abdominal pain that lasts longer than 24 hours.  Your pain medicine is not helping.  You have a fever.  You have nausea or vomiting that will not go away.  You miss your period.  You have vaginal bleeding between your periods that is heavier than spotting. Summary  Mittelschmerz is a condition that affects women. It is pain in the abdomen that occurs between periods.  Mittelschmerz is caused by the growth and release of an egg from an ovary, and it is a natural part of the ovulation cycle.  The pain in this condition may affect one side of the abdomen, may be mild or severe, or may cause nausea.  To relieve your pain, try soaking in a hot bath, using a heating pad, or taking prescription or over-the-counter medicines.  Contact a health care provider if you have pain most months of the year, your pain lasts  more than 24 hours, your pain medicine is not helping, or you have vaginal bleeding that is heavier than spotting. This information is not intended to replace advice given to you by your health care provider. Make sure you discuss any questions you have with your health care provider. Document Released: 10/31/2002 Document Revised: 05/25/2018 Document Reviewed: 05/25/2018 Elsevier Patient Education  2020 [employer].

## 2019-11-06 ENCOUNTER — Other Ambulatory Visit: Payer: Self-pay | Admitting: Internal Medicine

## 2019-11-09 ENCOUNTER — Other Ambulatory Visit: Payer: Self-pay | Admitting: Obstetrics and Gynecology

## 2019-12-02 ENCOUNTER — Ambulatory Visit: Payer: Managed Care, Other (non HMO) | Admitting: Internal Medicine

## 2019-12-08 LAB — HM DIABETES EYE EXAM

## 2019-12-20 ENCOUNTER — Encounter: Payer: Self-pay | Admitting: Internal Medicine

## 2019-12-23 ENCOUNTER — Encounter: Payer: Self-pay | Admitting: Internal Medicine

## 2019-12-23 ENCOUNTER — Other Ambulatory Visit: Payer: Self-pay

## 2019-12-23 ENCOUNTER — Ambulatory Visit
Admission: EM | Admit: 2019-12-23 | Discharge: 2019-12-23 | Disposition: A | Discharge status: Home / Self Care | Payer: Managed Care, Other (non HMO) | Attending: Emergency Medicine | Admitting: Emergency Medicine

## 2019-12-23 DIAGNOSIS — I10 Essential (primary) hypertension: Secondary | ICD-10-CM | POA: Diagnosis not present

## 2019-12-23 DIAGNOSIS — R519 Headache, unspecified: Secondary | ICD-10-CM | POA: Diagnosis not present

## 2019-12-23 MED ORDER — KETOROLAC TROMETHAMINE 30 MG/ML IJ SOLN
30.0000 mg | Freq: Once | INTRAMUSCULAR | Status: AC
  Administered 2019-12-23: 30 mg via INTRAMUSCULAR

## 2019-12-23 MED ORDER — ONDANSETRON 4 MG PO TBDP
4.0000 mg | ORAL_TABLET | Freq: Once | ORAL | Status: AC
  Administered 2019-12-23: 4 mg via ORAL

## 2019-12-23 MED ORDER — ONDANSETRON HCL 4 MG PO TABS: 4.0000 mg | ORAL_TABLET | Freq: Four times a day (QID) | ORAL | 0 refills | Status: AC

## 2019-12-23 NOTE — Telephone Encounter (Signed)
Patient voiced understanding and going to UC.

## 2019-12-23 NOTE — ED Provider Notes (Signed)
Roderic Palau    CSN: [id number] Arrival date & time: 12/23/19  1258      History   Chief Complaint Chief Complaint  Patient presents with  . Headache    HPI Shirley [name] is a 39 y.o. female.   Patient presents with "migraine headache" and nausea since yesterday.  She states her blood pressure was elevated yesterday when she took it at home; she has a history of hypertension.  She denies dizziness, weakness, numbness, chest pain, palpitations, shortness of breath, cough, sore throat, ear pain, rash, vomiting, diarrhea, or other symptoms.  Treatment attempted at home with Tylenol.    The history is provided by the patient.    Past Medical History:  Diagnosis Date  . Anemia   . Asthma    WELL CONTROLLED  . Complication of anesthesia    -TACYCARDIA AFTER GASTRIC SURGERY IN 2016 AND HAD TO STAY 5 DAYS-itching after surgery 2005, 2006  . Depression   . Diabetes mellitus ([place])   . Fatty liver   . GERD (gastroesophageal reflux disease)   . Heart murmur   . History of dermoid cyst excision   . History of hiatal hernia   . Hypertension    H/O  . IBS (irritable bowel syndrome)   . Migraine   . PONV (postoperative nausea and vomiting)    nausea    Patient Active Problem List   Diagnosis Date Noted  . Anemia 02/13/2019  . Herpes zoster 01/16/2019  . Diabetes mellitus without complication ([place]) 32/95/1884  . Bloating 08/07/2018  . BMI 45.0-49.9, adult ([place]) 06/09/2018  . Epigastric pain 06/09/2018  . Status post hysteroscopy 03/30/2018  . Asthma exacerbation 09/16/2017  . Tooth abscess 05/29/2017  . Cough 01/31/2017  . Right shoulder pain 11/11/2016  . Headache 11/11/2016  . Sinusitis 08/31/2016  . Generalized anxiety disorder 07/04/2016  . Major depressive disorder, recurrent episode, moderate ([place]) 07/04/2016  . Dizziness 06/01/2016  . Fatigue 05/01/2016  . Sleep difficulties 02/12/2016  . Bariatric surgery status 08/28/2015  . Severe obesity (BMI >=  40) ([place]) 09/04/2014  . Stress 02/14/2014  . SOB (shortness of breath) 09/15/2013  . Environmental allergies 09/15/2013  . Microalbuminuria 07/27/2013  . Vitamin D deficiency 10/09/2012  . Hypertension 09/06/2012  . GERD (gastroesophageal reflux disease) 09/06/2012    Past Surgical History:  Procedure Laterality Date  . CHOLECYSTECTOMY  2005  . DILATATION & CURETTAGE/HYSTEROSCOPY WITH MYOSURE N/A 03/30/2018   Procedure: DILATATION & CURETTAGE/HYSTEROSCOPY;  Surgeon: Homero Fellers, MD;  Location: [facility] ORS;  Service: Gynecology;  Laterality: N/A;  . LAPAROSCOPIC GASTRIC RESTRICTIVE DUODENAL PROCEDURE (DUODENAL SWITCH) N/A 08/28/2015   Procedure: LAPAROSCOPIC GASTRIC RESTRICTIVE DUODENAL PROCEDURE (DUODENAL SWITCH);  Surgeon: Ladora Daniel, MD;  Location: [facility] ORS;  Service: General;  Laterality: N/A;  . 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 Authorizing Provider  albuterol (PROVENTIL HFA;VENTOLIN HFA) 108 (90 Base) MCG/ACT inhaler Inhale 2 puffs into the lungs every  6 (six) hours as needed for wheezing or shortness of breath. Reported on 05/01/2016 08/20/16  Yes Dale Huson, MD  azelastine (OPTIVAR) 0.05 % ophthalmic solution Place 1 drop into both eyes 2 (two) times daily as needed (for allergic eye). Reported on 05/01/2016   Yes [provider]  clindamycin (CLEOCIN T) 1 % external solution Apply 1 application topically 2 (two) times daily as needed (applied to breast, groin, and underarms as needed.).   Yes [provider]  clindamycin (CLEOCIN) 300 MG capsule Take 300 mg by mouth See admin instructions. Take 1 capsule (300 mg) daily for 2 weeks as needed HS   Yes [provider]    diclofenac Sodium (VOLTAREN) 1 % GEL Apply topically. 02/16/19  Yes [provider]  glucose blood (PRECISION QID TEST) test strip Use 1 strip  twice a day  [dx 250.02]   Yes [provider]  ketoconazole (NIZORAL) 2 % shampoo Apply 1 application topically 3 (three) times a week. Use 2-3 times a week. Leave in for 5 minutes before rinsing out   Yes [provider]  lamoTRIgine (LAMICTAL) 100 MG tablet  09/29/19  Yes [provider]  levocetirizine (XYZAL) 5 MG tablet Take 1 tablet (5 mg total) by mouth every evening. Reported on 05/01/2016 08/20/16  Yes Dale Cooper, MD  metFORMIN (GLUCOPHAGE-XR) 500 MG 24 hr tablet TAKE 1 TABLET(500 MG) BY MOUTH TWICE DAILY WITH A MEAL 06/08/19  Yes Carlus Pavlov, MD  montelukast (SINGULAIR) 10 MG tablet Take 10 mg by mouth at bedtime.   Yes [provider]  naproxen (NAPROSYN) 500 MG tablet Take 1 tablet (500 mg total) by mouth 2 (two) times daily with a meal. As needed for pain 11/02/19  Yes Schuman, Christanna R, MD  Olopatadine HCl 0.6 % SOLN USE 1 SPRAY INTO THE NOSE DAILY AS NEEDED FOR ALLERGIES 11/11/19  Yes Dale [place] Diamond, MD  pantoprazole (PROTONIX) 40 MG tablet Take 1 tablet (40 mg total) by mouth every morning. 07/18/19  Yes Dale Ephraim, MD  rifampin (RIFADIN) 300 MG capsule Take 300 mg by mouth See admin instructions. Take 1 capsule (300 mg) by mouth daily for 2 weeks for HS   Yes [provider]  Semaglutide (RYBELSUS) 7 MG TABS Take 7 mg by mouth daily. 11/01/19  Yes Carlus Pavlov, MD  sertraline (ZOLOFT) 50 MG tablet Take 50 mg by mouth every morning. 10/26/19  Yes [provider]  traZODone (DESYREL) 50 MG tablet Take 1 tablet by mouth at bedtime. 09/29/19  Yes [provider]  triamcinolone ointment (KENALOG) 0.1 % Apply 1 application topically 2 (two) times daily as needed (applied to affected areas of scalp).   Yes [provider]  Monte Fantasia INHUB 250-50 MCG/DOSE  AEPB INL 1 PUFF PO BID 03/11/19  Yes [provider]  ondansetron (ZOFRAN) 4 MG tablet Take 1 tablet (4 mg total) by mouth every 6 (six) hours. 12/23/19   Mickie Bail, NP    Family History Family History  Problem Relation Age of Onset  . Hypertension Mother   . Diabetes Mother   . Asthma Brother   . Breast cancer Neg Hx   . Colon cancer Neg Hx     Social History Social History   Tobacco Use  . Smoking status: Never Smoker  . Smokeless tobacco: Never Used  Substance Use Topics  . Alcohol use: Not Currently    Alcohol/week: 0.0 standard drinks  . Drug use: No  Allergies   Ibuprofen and Caffeine   Review of Systems Review of Systems  Constitutional: Negative for chills and fever.  HENT: Negative for congestion, ear pain, rhinorrhea and sore throat.   Eyes: Negative for pain and visual disturbance.  Respiratory: Negative for cough and shortness of breath.   Cardiovascular: Negative for chest pain and palpitations.  Gastrointestinal: Positive for nausea. Negative for abdominal pain, diarrhea and vomiting.  Genitourinary: Negative for dysuria and hematuria.  Musculoskeletal: Negative for arthralgias and back pain.  Skin: Negative for color change and rash.  Neurological: Positive for headaches. Negative for dizziness, seizures, syncope, facial asymmetry, speech difficulty, weakness and numbness.  All other systems reviewed and are negative.    Physical Exam Triage Vital Signs ED Triage Vitals  Enc Vitals Group     BP      Pulse      Resp      Temp      Temp src      SpO2      Weight      Height      Head Circumference      Peak Flow      Pain Score      Pain Loc      Pain Edu?      Excl. in GC?    No data found.  Updated Vital Signs BP (!) 145/90 (BP Location: Left Arm)   Pulse 71   Temp 98.8 F (37.1 C) (Oral)   Resp 18   Ht 5\' 1"  (1.549 m)   Wt 230 lb (104.3 kg)   LMP 12/16/2019   SpO2 98%   BMI 43.46 kg/m   Visual Acuity Right  Eye Distance:   Left Eye Distance:   Bilateral Distance:    Right Eye Near:   Left Eye Near:    Bilateral Near:     Physical Exam Vitals and nursing note reviewed.  Constitutional:      General: She is not in acute distress.    Appearance: She is well-developed. She is obese.  HENT:     Head: Normocephalic and atraumatic.     Right Ear: Tympanic membrane normal.     Left Ear: Tympanic membrane normal.     Nose: Nose normal.     Mouth/Throat:     Mouth: Mucous membranes are moist.     Pharynx: Oropharynx is clear.  Eyes:     Conjunctiva/sclera: Conjunctivae normal.  Cardiovascular:     Rate and Rhythm: Normal rate and regular rhythm.     Heart sounds: No murmur.  Pulmonary:     Effort: Pulmonary effort is normal. No respiratory distress.     Breath sounds: Normal breath sounds.  Abdominal:     General: Bowel sounds are normal.     Palpations: Abdomen is soft.     Tenderness: There is no abdominal tenderness. There is no guarding or rebound.  Musculoskeletal:     Cervical back: Neck supple.  Skin:    General: Skin is warm and dry.     Findings: No rash.  Neurological:     General: No focal deficit present.     Mental Status: She is alert and oriented to person, place, and time.     Cranial Nerves: No cranial nerve deficit.     Sensory: No sensory deficit.     Motor: No weakness.     Coordination: Romberg sign negative. Coordination normal.     Gait: Gait normal.  Deep Tendon Reflexes: Reflexes normal.  Psychiatric:        Mood and Affect: Mood normal.        Behavior: Behavior normal.      UC Treatments / Results  Labs (all labs ordered are listed, but only abnormal results are displayed) Labs Reviewed - No data to display  EKG   Radiology No results found.  Procedures Procedures (including critical care time)  Medications Ordered in UC Medications  ketorolac (TORADOL) 30 MG/ML injection 30 mg (30 mg Intramuscular Given 12/23/19 1329)    ondansetron (ZOFRAN-ODT) disintegrating tablet 4 mg (4 mg Oral Given 12/23/19 1329)    Initial Impression / Assessment and Plan / UC Course  I have reviewed the triage vital signs and the nursing notes.  Pertinent labs & imaging results that were available during my care of the patient were reviewed by me and considered in my medical decision making (see chart for details).    Acute non-intractable headache.  Elevated blood pressure with known hypertension.  Patient is well-appearing and her exam is unremarkable.  Treated here with Zofran and Toradol.  Rx for Zofran.  Instructed patient to have her BP rechecked by her PCP in 2 to 4 weeks.  Instructed her to go to the emergency department if she has acute worsening headache or other concerning symptoms.  Patient agrees to plan of care.     Final Clinical Impressions(s) / UC Diagnoses   Final diagnoses:  Acute nonintractable headache, unspecified headache type  Elevated blood pressure reading in office with diagnosis of hypertension     Discharge Instructions     Today you were given Zofran to treat your nausea and Toradol to treat your headache.    Your blood pressure is elevated today at 145/90.  Please have this rechecked by your primary care provider in 2-4 weeks.      Go to the emergency department if you have acute worsening headache or other concerning symptoms.           ED Prescriptions    Medication Sig Dispense Auth. Provider   ondansetron (ZOFRAN) 4 MG tablet Take 1 tablet (4 mg total) by mouth every 6 (six) hours. 12 tablet Mickie Bail, NP     I have reviewed the PDMP during this encounter.   Mickie Bail, NP 12/23/19 [phone]

## 2019-12-23 NOTE — ED Triage Notes (Signed)
Patient states that she has been having a headache since yesterday and has been constant. States that she checked her BP yesterday and noticed it was running high.

## 2019-12-23 NOTE — Discharge Instructions (Addendum)
Today you were given Zofran to treat your nausea and Toradol to treat your headache.    Your blood pressure is elevated today at 145/90.  Please have this rechecked by your primary care provider in 2-4 weeks.      Go to the emergency department if you have acute worsening headache or other concerning symptoms.

## 2019-12-23 NOTE — ED Notes (Signed)
Patient received Toradol injection tolerated well.

## 2019-12-23 NOTE — Telephone Encounter (Signed)
Please call and notify pt that the pain can contribute to elevated blood pressure, but blood pressure being elevated and contribute to headache.  This is now for her. Does not have history of these headaches.  Given headache, elevated blood pressure, etc - I feels she needs to be evaluated to confirm nothing more acute going on - UC or ER .

## 2019-12-26 ENCOUNTER — Other Ambulatory Visit: Payer: Self-pay

## 2019-12-26 MED ORDER — RYBELSUS 7 MG PO TABS: 7.0000 mg | ORAL_TABLET | Freq: Every day | ORAL | 2 refills | Status: DC

## 2019-12-29 ENCOUNTER — Encounter: Payer: Self-pay | Admitting: Emergency Medicine

## 2019-12-29 ENCOUNTER — Encounter: Payer: Self-pay | Admitting: Internal Medicine

## 2019-12-29 DIAGNOSIS — Z5321 Procedure and treatment not carried out due to patient leaving prior to being seen by health care provider: Secondary | ICD-10-CM | POA: Diagnosis not present

## 2019-12-29 DIAGNOSIS — H538 Other visual disturbances: Secondary | ICD-10-CM | POA: Insufficient documentation

## 2019-12-29 DIAGNOSIS — I10 Essential (primary) hypertension: Secondary | ICD-10-CM | POA: Diagnosis not present

## 2019-12-29 DIAGNOSIS — E119 Type 2 diabetes mellitus without complications: Secondary | ICD-10-CM | POA: Diagnosis not present

## 2019-12-29 DIAGNOSIS — Z7984 Long term (current) use of oral hypoglycemic drugs: Secondary | ICD-10-CM | POA: Diagnosis not present

## 2019-12-29 DIAGNOSIS — R519 Headache, unspecified: Secondary | ICD-10-CM | POA: Insufficient documentation

## 2019-12-29 LAB — BASIC METABOLIC PANEL
Anion gap: 9 (ref 5–15)
BUN: 11 mg/dL (ref 6–20)
CO2: 23 mmol/L (ref 22–32)
Calcium: 8.6 mg/dL — ABNORMAL LOW (ref 8.9–10.3)
Chloride: 105 mmol/L (ref 98–111)
Creatinine, Ser: 0.69 mg/dL (ref 0.44–1.00)
GFR calc Af Amer: >60 mL/min (ref >60)
GFR calc non Af Amer: >60 mL/min (ref >60)
Glucose, Bld: 167 mg/dL — ABNORMAL HIGH (ref 70–99)
Potassium: 3.9 mmol/L (ref 3.5–5.1)
Sodium: 137 mmol/L (ref 135–145)

## 2019-12-29 LAB — CBC
HCT: 35.8 % — ABNORMAL LOW (ref 36.0–46.0)
Hemoglobin: 10.8 g/dL — ABNORMAL LOW (ref 12.0–15.0)
MCH: 22.5 pg — ABNORMAL LOW (ref 26.0–34.0)
MCHC: 30.2 g/dL (ref 30.0–36.0)
MCV: 74.6 fL — ABNORMAL LOW (ref 80.0–100.0)
Platelets: 244 10*3/uL (ref 150–400)
RBC: 4.8 MIL/uL (ref 3.87–5.11)
RDW: 14.6 % (ref 11.5–15.5)
WBC: 7.8 10*3/uL (ref 4.0–10.5)
nRBC: 0 % (ref 0.0–0.2)

## 2019-12-29 LAB — TROPONIN I (HIGH SENSITIVITY): Troponin I (High Sensitivity): <2 ng/L (ref <18)

## 2019-12-29 NOTE — ED Triage Notes (Signed)
Pt reports she has had intermittent HA and blurred vision x2 weeks. Pt reports she has had high BP readings during this time. Pt seen at [facility] on 1/29 for same. Pt reports she has not taken BP medication since bariatric surgery in 2016. Pt has hx/o DM as well.

## 2019-12-30 ENCOUNTER — Emergency Department
Admission: EM | Admit: 2019-12-30 | Discharge: 2019-12-30 | Disposition: A | Discharge status: Home / Self Care | Payer: Managed Care, Other (non HMO) | Attending: Emergency Medicine | Admitting: Emergency Medicine

## 2019-12-30 ENCOUNTER — Telehealth: Payer: Self-pay | Admitting: Emergency Medicine

## 2019-12-30 NOTE — Telephone Encounter (Signed)
Called patient to get more information. Confirmed doing ok. Apologized for the delayed response and also advised that any time she is having acute symptoms she should call or go to be evaluated given my chart messages are not always seen in an urgent time frame. Pt understood. She did go back to urgent care last night. Blood work was done and EKG- stated both were normal. BP was 145/90 at UC. Headache is not constant and when she does have a headache she stated that it was in the front all the way across her forehead. Confirmed no blurred vision, no neck pain, no head ache right now. Does not have history of migraines. Does have some relief when using tylenol. Noted that she is under a lot of increased stress at work and she is more tense. BP last night after she got home was 130/80. This morning it was 120/74. She did leave the urgent care last night without seeing a doctor after they told her labs and ekg was normal. When reviewing her labs it looks like her hemoglobin was 10.8. Her last one was 12.1. I was going to schedule her for a virtual with you but wanted to get your advice given this is something new for her and she has been to acute care twice within the last couple of weeks.

## 2019-12-30 NOTE — ED Notes (Signed)
No answer when called several times from lobby 

## 2019-12-30 NOTE — Telephone Encounter (Signed)
Pt aware of below and confirmed no GI symptoms. Scheduled for monday

## 2019-12-30 NOTE — Telephone Encounter (Signed)
Called patient due to lwot to inquire about condition and follow up plans. He will call or message pcp to alert them that he was not seen and has results ready.

## 2019-12-30 NOTE — Telephone Encounter (Signed)
Sounds like she is feeling better.  I agree with reaching out to Dr Maryruth Bun - given increased stress.  Also confirm with pt that she has not noticed an bleeding or increased GI symptoms.  Blood pressure doing better. No headache now. I would like for her to schedule a f/u appt (doxy) with me if she is agreeable.  Continue to monitor blood pressure.  If any acute change, she will need to be evaluated.

## 2020-01-02 ENCOUNTER — Ambulatory Visit (INDEPENDENT_AMBULATORY_CARE_PROVIDER_SITE_OTHER): Payer: Managed Care, Other (non HMO) | Admitting: Internal Medicine

## 2020-01-02 ENCOUNTER — Encounter: Payer: Self-pay | Admitting: Internal Medicine

## 2020-01-02 ENCOUNTER — Other Ambulatory Visit: Payer: Self-pay

## 2020-01-02 VITALS — BP 141/99 | Ht 61.0 in | Wt 230.0 lb

## 2020-01-02 DIAGNOSIS — F439 Reaction to severe stress, unspecified: Secondary | ICD-10-CM

## 2020-01-02 DIAGNOSIS — R519 Headache, unspecified: Secondary | ICD-10-CM | POA: Diagnosis not present

## 2020-01-02 DIAGNOSIS — Z9109 Other allergy status, other than to drugs and biological substances: Secondary | ICD-10-CM

## 2020-01-02 DIAGNOSIS — G479 Sleep disorder, unspecified: Secondary | ICD-10-CM

## 2020-01-02 DIAGNOSIS — D509 Iron deficiency anemia, unspecified: Secondary | ICD-10-CM

## 2020-01-02 DIAGNOSIS — E119 Type 2 diabetes mellitus without complications: Secondary | ICD-10-CM

## 2020-01-02 DIAGNOSIS — K219 Gastro-esophageal reflux disease without esophagitis: Secondary | ICD-10-CM

## 2020-01-02 DIAGNOSIS — G4452 New daily persistent headache (NDPH): Secondary | ICD-10-CM

## 2020-01-02 DIAGNOSIS — F331 Major depressive disorder, recurrent, moderate: Secondary | ICD-10-CM

## 2020-01-02 DIAGNOSIS — I10 Essential (primary) hypertension: Secondary | ICD-10-CM

## 2020-01-02 DIAGNOSIS — F411 Generalized anxiety disorder: Secondary | ICD-10-CM

## 2020-01-02 MED ORDER — VERAPAMIL HCL ER 120 MG PO TBCR: 120.0000 mg | EXTENDED_RELEASE_TABLET | Freq: Every day | ORAL | 1 refills | Status: DC

## 2020-01-02 NOTE — Assessment & Plan Note (Signed)
Followed by Dr Sharma.  Stable.  

## 2020-01-02 NOTE — Assessment & Plan Note (Signed)
Persistent headache as outlined.  Unclear etiology.  Elevated blood pressure.  Treat blood pressure with calcium channel blocker, which may help headache.  Given persistent increased daily headache, check MRI brain.  Discussed with neurology.  Schedule appt with neurology.

## 2020-01-02 NOTE — Assessment & Plan Note (Signed)
Being followed by Dr Maryruth Bun.  Just had lamictal increased.  On zoloft.  Keep f/u with Dr Maryruth Bun.

## 2020-01-02 NOTE — Assessment & Plan Note (Signed)
Had previous split night sleep study.  Negative for obstructive sleep apnea.  Has trazodone.  Still not sleeping well.  Discuss with psychiatry.

## 2020-01-02 NOTE — Assessment & Plan Note (Signed)
Elevated blood pressure.  Persistent.  Start verapamil 120mg  q day.  May help headache. Follow pressures.  Check metabolic panel.

## 2020-01-02 NOTE — Assessment & Plan Note (Signed)
Increased work stress.  Was questioning if the stress could be contributing to her headache.  She discussed with Dr Maryruth Bun.  lamictal just increased.  Keep f/u with Dr Maryruth Bun.

## 2020-01-02 NOTE — Assessment & Plan Note (Signed)
Low carb diet and exercise.  Follow met b and a1c.   

## 2020-01-02 NOTE — Assessment & Plan Note (Signed)
Controlled on protonix.   

## 2020-01-02 NOTE — Assessment & Plan Note (Signed)
Followed by psychiatry.  Just had lamictal dose adjusted.  On zoloft.  Continue f/u with psychiatry.

## 2020-01-02 NOTE — Assessment & Plan Note (Signed)
Decreased hemoglobin on recent labs.  Recheck cbc, iron studies and B12.

## 2020-01-02 NOTE — Progress Notes (Signed)
Patient ID: Shirley [name], female   DOB: 11-Jan-1981, 39 y.o.   MRN: [id number]   Virtual Visit via video Note  This visit type was conducted due to national recommendations for restrictions regarding the COVID-19 pandemic (e.g. social distancing).  This format is felt to be most appropriate for this patient at this time.  All issues noted in this document were discussed and addressed.  No physical exam was performed (except for noted visual exam findings with Video Visits).   I connected with Roselyn Bering by a video enabled telemedicine application and verified that I am speaking with the correct person using two identifiers. Location patient: home Location provider: work Persons participating in the virtual visit: patient, provider  The limitations, risks, security and privacy concerns of performing an evaluation and management service by video and the availability of in person appointments have been discussed.  The patient expressed understanding and agreed to proceed.    Reason for visit: work in appt.   HPI: Work in appt.  She reports that starting 12/22/19, she started having headache.  Blood pressure elevated.  Took two tylenol and drank water and went to bed.  Next morning, head felt like a bowling ball.  Could barely get up.  Described left "eye tension" with frontal headache.  Light sensitivity, stating could not handle bright light.  No double vision.  Some nausea.  Went to urgent care.  Was given zofran and toradol.  Helped to ease the pressure.  Decreased appetite.  Headache continued all through last week.  On 12/30/19, headache and blood pressure increased.  To ER.  Again the left eye felt strained.  Blood pressure increased some in ER, but not as high as was prior to her coming in.  Due to increased anxiety, she left without being seen.  Blood pressure at home, 123/74.  Since ER visit, she felt better this weekend.  Was not working.  Does a lot of computer work.  Increased stress  with her job.  Called her psychiatrist.  Her lamictal dose was increased.  Continues on zoloft.  Not sleeping well.  Does not feel the trazodone is helping.  Headache was better this weekend.  Blood pressures over weekend averaging 127-141/78-99.  No sinus pressure.  No congestion.  Denies loss of taste or smell.  No fever.  No history of migraine headaches.  No sleep apnea.  Still with headache today, but appetite is better.  Eating and trying to stay hydrated.  Had eyes checked early 11/2019.  Has new glasses.  Does report some increased pain with leaning forward and laughing.  The day she went to the ER, she felt a little off balance, but has not noticed since.  Not taking anything for her blood pressures now.    ROS: See pertinent positives and negatives per HPI.  Past Medical History:  Diagnosis Date  . Anemia   . Asthma    WELL CONTROLLED  . Complication of anesthesia    -TACYCARDIA AFTER GASTRIC SURGERY IN 2016 AND HAD TO STAY 5 DAYS-itching after surgery 2005, 2006  . Depression   . Diabetes mellitus ([place])   . Fatty liver   . GERD (gastroesophageal reflux disease)   . Heart murmur   . History of dermoid cyst excision   . History of hiatal hernia   . Hypertension    H/O  . IBS (irritable bowel syndrome)   . Migraine   . PONV (postoperative nausea and vomiting)    nausea  Past Surgical History:  Procedure Laterality Date  . CHOLECYSTECTOMY  2005  . DILATATION & CURETTAGE/HYSTEROSCOPY WITH MYOSURE N/A 03/30/2018   Procedure: DILATATION & CURETTAGE/HYSTEROSCOPY;  Surgeon: Homero Fellers, MD;  Location: [facility] ORS;  Service: Gynecology;  Laterality: N/A;  . LAPAROSCOPIC GASTRIC RESTRICTIVE DUODENAL PROCEDURE (DUODENAL SWITCH) N/A 08/28/2015   Procedure: LAPAROSCOPIC GASTRIC RESTRICTIVE DUODENAL PROCEDURE (DUODENAL SWITCH);  Surgeon: Ladora Daniel, MD;  Location: [facility] ORS;  Service: General;  Laterality: N/A;  . 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 Hypertension Mother   . Diabetes Mother   . Asthma Brother   . Breast cancer Neg Hx   . Colon cancer Neg Hx     SOCIAL HX: reviewed.    Current Outpatient Medications:  .  albuterol (PROVENTIL HFA;VENTOLIN HFA) 108 (90 Base) MCG/ACT inhaler, Inhale 2 puffs into the lungs every 6 (six) hours as needed for wheezing or shortness of breath. Reported on 05/01/2016, Disp: 1 Inhaler, Rfl: 1 .  azelastine (OPTIVAR) 0.05 % ophthalmic solution, Place 1 drop into both eyes 2 (two) times daily as needed (for allergic eye). Reported on 05/01/2016, Disp: , Rfl:  .  clindamycin (CLEOCIN T) 1 % external solution, Apply 1 application topically 2 (two) times daily as needed (applied to breast, groin, and underarms as needed.)., Disp: , Rfl:  .  clindamycin (CLEOCIN) 300 MG capsule, Take 300 mg by mouth See admin instructions. Take 1 capsule (300 mg) daily for 2 weeks as needed HS, Disp: , Rfl:  .  diclofenac Sodium (VOLTAREN) 1 % GEL, Apply topically., Disp: , Rfl:  .  glucose blood (PRECISION QID TEST) test strip, Use 1 strip  twice a day  [dx 250.02], Disp: , Rfl:  .  ketoconazole (NIZORAL) 2 % shampoo, Apply 1 application topically 3 (three) times a week. Use 2-3 times a week. Leave in for 5 minutes before rinsing out, Disp: , Rfl:  .  lamoTRIgine (LAMICTAL) 100 MG tablet, , Disp: , Rfl:  .  levocetirizine (XYZAL) 5 MG tablet, Take 1 tablet (5 mg total) by mouth every evening. Reported on 05/01/2016, Disp: 30 tablet, Rfl: 1 .  metFORMIN (GLUCOPHAGE-XR) 500 MG 24 hr tablet, TAKE 1 TABLET(500 MG) BY MOUTH TWICE DAILY WITH A MEAL, Disp: 180 tablet, Rfl: 3 .  montelukast (SINGULAIR) 10 MG tablet, Take 10 mg by mouth at bedtime., Disp: , Rfl:  .  naproxen (NAPROSYN) 500 MG tablet, Take 1 tablet (500 mg total) by mouth 2 (two) times daily with a meal. As needed for pain, Disp: 60 tablet, Rfl: 2 .  Olopatadine HCl 0.6 % SOLN, USE 1 SPRAY INTO THE NOSE DAILY AS NEEDED FOR ALLERGIES,  Disp: 30.5 g, Rfl: 0 .  ondansetron (ZOFRAN) 4 MG tablet, Take 1 tablet (4 mg total) by mouth every 6 (six) hours., Disp: 12 tablet, Rfl: 0 .  pantoprazole (PROTONIX) 40 MG tablet, Take 1 tablet (40 mg total) by mouth every morning., Disp: 90 tablet, Rfl: 1 .  rifampin (RIFADIN) 300 MG capsule, Take 300 mg by mouth See admin instructions. Take 1 capsule (300 mg) by mouth daily for 2 weeks for HS, Disp: , Rfl:  .  Semaglutide (RYBELSUS) 7 MG TABS, Take 7 mg by mouth daily., Disp: 90 tablet, Rfl: 2 .  sertraline (ZOLOFT) 100 MG tablet, Take 100 mg by mouth daily., Disp: , Rfl:  .  traZODone (DESYREL) 50 MG tablet, Take  1 tablet by mouth at bedtime., Disp: , Rfl:  .  triamcinolone ointment (KENALOG) 0.1 %, Apply 1 application topically 2 (two) times daily as needed (applied to affected areas of scalp)., Disp: , Rfl:  .  verapamil (CALAN-SR) 120 MG CR tablet, Take 1 tablet (120 mg total) by mouth daily., Disp: 30 tablet, Rfl: 1 .  WIXELA INHUB 250-50 MCG/DOSE AEPB, INL 1 PUFF PO BID, Disp: , Rfl:   Current Facility-Administered Medications:  .  polyethylene glycol (MIRALAX / GLYCOLAX) packet 17 g, 17 g, Oral, Daily, Schuman, Christanna R, MD  EXAM:  VITALS per patient if applicable:  259/56   GENERAL: alert, oriented, appears well and in no acute distress  HEENT: atraumatic, conjunttiva clear, no obvious abnormalities on inspection of external nose and ears  NECK: normal movements of the head and neck  LUNGS: on inspection no signs of respiratory distress, breathing rate appears normal, no obvious gross SOB, gasping or wheezing  CV: no obvious cyanosis  PSYCH/NEURO: pleasant and cooperative, no obvious depression or anxiety, speech and thought processing grossly intact  ASSESSMENT AND PLAN:  Discussed the following assessment and plan:  Anemia Decreased hemoglobin on recent labs.  Recheck cbc, iron studies and B12.   Diabetes mellitus without complication (HCC) Low carb diet and  exercise.  Follow met b and a1c.    Environmental allergies Followed by Dr Donneta Romberg.  Stable.    Generalized anxiety disorder Followed by psychiatry.  Just had lamictal dose adjusted.  On zoloft.  Continue f/u with psychiatry.    GERD (gastroesophageal reflux disease) Controlled on protonix.    Headache Persistent headache as outlined.  Unclear etiology.  Elevated blood pressure.  Treat blood pressure with calcium channel blocker, which may help headache.  Given persistent increased daily headache, check MRI brain.  Discussed with neurology.  Schedule appt with neurology.    Hypertension Elevated blood pressure.  Persistent.  Start verapamil '120mg'$  q day.  May help headache. Follow pressures.  Check metabolic panel.   Major depressive disorder, recurrent episode, moderate ([place]) Being followed by Dr Nicolasa Ducking.  Just had lamictal increased.  On zoloft.  Keep f/u with Dr Nicolasa Ducking.    Sleep difficulties Had previous split night sleep study.  Negative for obstructive sleep apnea.  Has trazodone.  Still not sleeping well.  Discuss with psychiatry.    Stress Increased work stress.  Was questioning if the stress could be contributing to her headache.  She discussed with Dr Nicolasa Ducking.  lamictal just increased.  Keep f/u with Dr Nicolasa Ducking.    Orders Placed This Encounter  Procedures  . MR Brain W Wo Contrast    Standing Status:   Future    Standing Expiration Date:   03/01/2021    Order Specific Question:   If indicated for the ordered procedure, I authorize the administration of contrast media per Radiology protocol    Answer:   Yes    Order Specific Question:   What is the patient's sedation requirement?    Answer:   No Sedation    Order Specific Question:   Does the patient have a pacemaker or implanted devices?    Answer:   No    Order Specific Question:   Radiology Contrast Protocol - do NOT remove file path    Answer:   [email or web address]    Order Specific Question:    Preferred imaging location?    Answer:   [facility] (table limit-400lbs)  . CBC with Differential/Platelet  . Hemoglobin A1c  .  Hepatic function panel  . Lipid panel  . Basic metabolic panel  . C-reactive protein  . Sedimentation rate  . Microalbumin / creatinine urine ratio  . Vitamin B12  . Iron and TIBC  . Ferritin    Meds ordered this encounter  Medications  . verapamil (CALAN-SR) 120 MG CR tablet    Sig: Take 1 tablet (120 mg total) by mouth daily.    Dispense:  30 tablet    Refill:  1     I discussed the assessment and treatment plan with the patient. The patient was provided an opportunity to ask questions and all were answered. The patient agreed with the plan and demonstrated an understanding of the instructions.   The patient was advised to call back or seek an in-person evaluation if the symptoms worsen or if the condition fails to improve as anticipated.   Einar Pheasant, MD

## 2020-01-03 NOTE — Telephone Encounter (Signed)
Patient talked with Dr Lorin Picket.

## 2020-01-04 ENCOUNTER — Ambulatory Visit
Admission: RE | Admit: 2020-01-04 | Discharge: 2020-01-04 | Disposition: A | Discharge status: Home / Self Care | Payer: Managed Care, Other (non HMO) | Source: Ambulatory Visit | Attending: Internal Medicine | Admitting: Internal Medicine

## 2020-01-04 ENCOUNTER — Other Ambulatory Visit: Payer: Self-pay

## 2020-01-04 DIAGNOSIS — G4452 New daily persistent headache (NDPH): Secondary | ICD-10-CM | POA: Diagnosis not present

## 2020-01-04 MED ORDER — GADOBUTROL 1 MMOL/ML IV SOLN
10.0000 mL | Freq: Once | INTRAVENOUS | Status: AC | PRN
  Administered 2020-01-04: 10 mL via INTRAVENOUS

## 2020-01-05 LAB — FERRITIN: Ferritin: 20 ng/mL (ref 15–150)

## 2020-01-05 LAB — CBC WITH DIFFERENTIAL/PLATELET
Basophils Absolute: 0 10*3/uL (ref 0.0–0.2)
Basos: 0 %
EOS (ABSOLUTE): 0.1 10*3/uL (ref 0.0–0.4)
Eos: 1 %
Hematocrit: 37.4 % (ref 34.0–46.6)
Hemoglobin: 11.3 g/dL (ref 11.1–15.9)
Immature Grans (Abs): 0 10*3/uL (ref 0.0–0.1)
Immature Granulocytes: 0 %
Lymphocytes Absolute: 2.5 10*3/uL (ref 0.7–3.1)
Lymphs: 36 %
MCH: 22.5 pg — ABNORMAL LOW (ref 26.6–33.0)
MCHC: 30.2 g/dL — ABNORMAL LOW (ref 31.5–35.7)
MCV: 75 fL — ABNORMAL LOW (ref 79–97)
Monocytes Absolute: 0.5 10*3/uL (ref 0.1–0.9)
Monocytes: 7 %
Neutrophils Absolute: 3.9 10*3/uL (ref 1.4–7.0)
Neutrophils: 56 %
Platelets: 262 10*3/uL (ref 150–450)
RBC: 5.02 x10E6/uL (ref 3.77–5.28)
RDW: 14.7 % (ref 11.7–15.4)
WBC: 7 10*3/uL (ref 3.4–10.8)

## 2020-01-05 LAB — BASIC METABOLIC PANEL
BUN/Creatinine Ratio: 12 (ref 9–23)
BUN: 9 mg/dL (ref 6–24)
CO2: 22 mmol/L (ref 20–29)
Calcium: 9 mg/dL (ref 8.7–10.2)
Chloride: 102 mmol/L (ref 96–106)
Creatinine, Ser: 0.78 mg/dL (ref 0.57–1.00)
GFR calc Af Amer: 110 mL/min/{1.73_m2} (ref >59)
GFR calc non Af Amer: 95 mL/min/{1.73_m2} (ref >59)
Glucose: 91 mg/dL (ref 65–99)
Potassium: 4.3 mmol/L (ref 3.5–5.2)
Sodium: 137 mmol/L (ref 134–144)

## 2020-01-05 LAB — MICROALBUMIN / CREATININE URINE RATIO
Creatinine, Urine: 286 mg/dL
Microalb/Creat Ratio: 7 mg/g creat (ref 0–29)
Microalbumin, Urine: 21.2 ug/mL

## 2020-01-05 LAB — VITAMIN B12: Vitamin B-12: 666 pg/mL (ref 232–1245)

## 2020-01-05 LAB — LIPID PANEL
Chol/HDL Ratio: 2.3 ratio (ref 0.0–4.4)
Cholesterol, Total: 147 mg/dL (ref 100–199)
HDL: 64 mg/dL (ref >39)
LDL Chol Calc (NIH): 65 mg/dL (ref 0–99)
Triglycerides: 95 mg/dL (ref 0–149)
VLDL Cholesterol Cal: 18 mg/dL (ref 5–40)

## 2020-01-05 LAB — IRON AND TIBC
Iron Saturation: 36 % (ref 15–55)
Iron: 139 ug/dL (ref 27–159)
Total Iron Binding Capacity: 389 ug/dL (ref 250–450)
UIBC: 250 ug/dL (ref 131–425)

## 2020-01-05 LAB — C-REACTIVE PROTEIN: CRP: 2 mg/L (ref 0–10)

## 2020-01-05 LAB — HEPATIC FUNCTION PANEL
ALT: 10 IU/L (ref 0–32)
AST: 17 IU/L (ref 0–40)
Albumin: 4 g/dL (ref 3.8–4.8)
Alkaline Phosphatase: 108 IU/L (ref 39–117)
Bilirubin Total: 0.5 mg/dL (ref 0.0–1.2)
Bilirubin, Direct: 0.13 mg/dL (ref 0.00–0.40)
Total Protein: 6.9 g/dL (ref 6.0–8.5)

## 2020-01-05 LAB — SEDIMENTATION RATE: Sed Rate: 30 mm/hr (ref 0–32)

## 2020-01-05 LAB — HEMOGLOBIN A1C
Est. average glucose Bld gHb Est-mCnc: 123 mg/dL
Hgb A1c MFr Bld: 5.9 % — ABNORMAL HIGH (ref 4.8–5.6)

## 2020-01-06 ENCOUNTER — Ambulatory Visit: Payer: Managed Care, Other (non HMO) | Admitting: Internal Medicine

## 2020-01-06 ENCOUNTER — Other Ambulatory Visit: Payer: Self-pay | Admitting: Obstetrics and Gynecology

## 2020-01-06 DIAGNOSIS — Z30013 Encounter for initial prescription of injectable contraceptive: Secondary | ICD-10-CM

## 2020-01-06 MED ORDER — MEDROXYPROGESTERONE ACETATE 150 MG/ML IM SUSP: 150.0000 mg | INTRAMUSCULAR | 3 refills | Status: AC

## 2020-01-06 NOTE — Telephone Encounter (Signed)
Please call and schedule patient for a Depo Injection visit.

## 2020-01-10 ENCOUNTER — Other Ambulatory Visit: Payer: Self-pay | Admitting: Internal Medicine

## 2020-01-10 ENCOUNTER — Telehealth: Payer: Self-pay

## 2020-01-10 DIAGNOSIS — D509 Iron deficiency anemia, unspecified: Secondary | ICD-10-CM

## 2020-01-10 NOTE — Telephone Encounter (Signed)
Called and LVM for pt to call us back to schedule a nurse visit for her Depo injection per CRS

## 2020-01-10 NOTE — Progress Notes (Signed)
Order placed for GI referral.   

## 2020-01-13 ENCOUNTER — Ambulatory Visit (INDEPENDENT_AMBULATORY_CARE_PROVIDER_SITE_OTHER): Payer: Managed Care, Other (non HMO)

## 2020-01-13 ENCOUNTER — Other Ambulatory Visit: Payer: Self-pay

## 2020-01-13 DIAGNOSIS — Z3042 Encounter for surveillance of injectable contraceptive: Secondary | ICD-10-CM | POA: Diagnosis not present

## 2020-01-13 MED ORDER — MEDROXYPROGESTERONE ACETATE 150 MG/ML IM SUSP
150.0000 mg | Freq: Once | INTRAMUSCULAR | Status: AC
  Administered 2020-01-13: 150 mg via INTRAMUSCULAR

## 2020-01-16 ENCOUNTER — Other Ambulatory Visit: Payer: Self-pay

## 2020-01-16 ENCOUNTER — Ambulatory Visit (INDEPENDENT_AMBULATORY_CARE_PROVIDER_SITE_OTHER): Payer: Managed Care, Other (non HMO) | Admitting: Gastroenterology

## 2020-01-16 ENCOUNTER — Encounter: Payer: Self-pay | Admitting: Gastroenterology

## 2020-01-16 DIAGNOSIS — D509 Iron deficiency anemia, unspecified: Secondary | ICD-10-CM

## 2020-01-16 DIAGNOSIS — K760 Fatty (change of) liver, not elsewhere classified: Secondary | ICD-10-CM | POA: Diagnosis not present

## 2020-01-16 MED ORDER — NA SULFATE-K SULFATE-MG SULF 17.5-3.13-1.6 GM/177ML PO SOLN: 354.0000 mL | Freq: Once | ORAL | 0 refills | Status: AC

## 2020-01-16 NOTE — Addendum Note (Signed)
Addended by: [name] on: 01/16/2020 12:04 PM   Modules accepted: Level of Service

## 2020-01-16 NOTE — Progress Notes (Signed)
Shirley [name] [address]  [address]  [place], [place] [address]  Main: [phone]  Fax: [phone]   Gastroenterology Consultation  Referring Provider:     Dale Ellston, MD Primary Care Physician:  Dale St. Clair, MD Reason for Consultation:    Iron deficiency anemia        HPI:   Virtual Visit via Video Note  I connected with patient on 01/16/20 at 11:00 AM EST by video ([email or web address]) and verified that I am speaking with the correct person using two identifiers.   I discussed the limitations, risks, security and privacy concerns of performing an evaluation and management service by video and the availability of in person appointments. I also discussed with the patient that there may be a patient responsible charge related to this service. The patient expressed understanding and agreed to proceed.  Location of the patient: Home Location of provider: Home Participating persons: Patient and provider only (Nursing staff checked in patient via phone but were not physically involved in the video interaction - see their notes)   History of Present Illness: Chief Complaint  Patient presents with  . New Patient (Initial Visit)  . Anemia    Has been fatigue. no blood in stools   . Constipation    Patient state some constipation     Shirley [name] is a 39 y.o. y/o female referred for consultation & management  by Dr. Dale Notasulga, MD.  Patient referred for iron deficiency anemia.  Reports previously being on oral iron replacement intermittently but has not been on it for 2 to 3 months.  Denies menorrhagia.  Denies any sources of bleeding.  Most recent ferritin is low normal at 20.  Low MCV.  Most recent iron saturation was less than 45%.  Had a sleeve gastrectomy in 2016.  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  Had an EGD in March 2019 by bariatric surgeon, [name], at [facility] for upper  abdominal pain.  This reported changes consistent with sleeve gastrectomy, sliding hiatal hernia, 2 cm.  Preserved antrum following prior sleeve gastrectomy.  Sleeve itself was noted to be consistently tubular in diameter and slightly dilated relative to original shape, "especially above the nonrestrictive incisura."  Absence of esophagitis was also reported.  Barium swallow in February 2019 reported mild to moderate dilation of esophagus with minimal intraesophageal reflux, markedly dilated sleep specially above incisura with large air bubble and air-fluid level.  Moderate dilation of antrum.  Had an EGD and colonoscopy in 2010 with [name] for iron deficiency anemia and heme positive stools.  EGD showed few gastric polyps, and was otherwise normal.  Colonoscopy reported diverticulosis and was otherwise normal.  CT abdomen pelvis in 2014 reported hepatic steatosis.  Patient is aware of the fatty liver diagnosis and was told to lose weight prior to her sleeve gastrectomy.  Most recent liver enzymes are normal.  Reports she also had hepatitis a and B vaccine series at [facility] clinic already.  Past Medical History:  Diagnosis Date  . Anemia   . Asthma    WELL CONTROLLED  . Complication of anesthesia    -TACYCARDIA AFTER GASTRIC SURGERY IN 2016 AND HAD TO STAY 5 DAYS-itching after surgery 2005, 2006  . Depression   . Diabetes mellitus (HCC)   . Fatty liver   . GERD (gastroesophageal reflux disease)   . Heart murmur   . History of dermoid cyst excision   .  History of hiatal hernia   . Hypertension    H/O  . IBS (irritable bowel syndrome)   . Migraine   . PONV (postoperative nausea and vomiting)    nausea    Past Surgical History:  Procedure Laterality Date  . CHOLECYSTECTOMY  2005  . DILATATION & CURETTAGE/HYSTEROSCOPY WITH MYOSURE N/A 03/30/2018   Procedure: DILATATION & CURETTAGE/HYSTEROSCOPY;  Surgeon: Schuman, Christanna R, MD;  Location: [facility] ORS;  Service: Gynecology;  Laterality:  N/A;  . LAPAROSCOPIC GASTRIC RESTRICTIVE DUODENAL PROCEDURE (DUODENAL SWITCH) N/A 08/28/2015   Procedure: LAPAROSCOPIC GASTRIC RESTRICTIVE DUODENAL PROCEDURE (DUODENAL SWITCH);  Surgeon: Michael A Tyner, MD;  Location: [facility] ORS;  Service: General;  Laterality: N/A;  . 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 Authorizing Provider  albuterol (PROVENTIL HFA;VENTOLIN HFA) 108 (90 Base) MCG/ACT inhaler Inhale 2 puffs into the lungs every 6 (six) hours as needed for wheezing or shortness of breath. Reported on 05/01/2016 08/20/16  Yes Scott, Charlene, MD  azelastine (OPTIVAR) 0.05 % ophthalmic solution Place 1 drop into both eyes 2 (two) times daily as needed (for allergic eye). Reported on 05/01/2016   Yes [provider]  clindamycin (CLEOCIN T) 1 % external solution Apply 1 application topically 2 (two) times daily as needed (applied to breast, groin, and underarms as needed.).   Yes [provider]  clindamycin (CLEOCIN) 300 MG capsule Take 300 mg by mouth See admin instructions. Take 1 capsule (300 mg) daily for 2 weeks as needed HS   Yes [provider]  diclofenac Sodium (VOLTAREN) 1 % GEL Apply topically. 02/16/19  Yes [provider]  glucose blood (PRECISION QID TEST) test strip Use 1 strip  twice a day  [dx 250.02]   Yes [provider]  ketoconazole (NIZORAL) 2 % shampoo Apply 1 application topically 3 (three) times a week. Use 2-3 times a week. Leave in for 5 minutes before rinsing out   Yes [provider]  lamoTRIgine (LAMICTAL) 100 MG tablet  09/29/19  Yes [provider]  levocetirizine (XYZAL) 5 MG tablet Take 1 tablet (5 mg total) by mouth every evening. Reported on 05/01/2016 08/20/16  Yes Scott, Charlene, MD  medroxyPROGESTERone (DEPO-PROVERA) 150 MG/ML injection Inject 1 mL (150 mg total) into the muscle every 3 (three) months. 01/06/20 04/05/20 Yes Schuman,  Christanna R, MD  metFORMIN (GLUCOPHAGE-XR) 500 MG 24 hr tablet TAKE 1 TABLET(500 MG) BY MOUTH TWICE DAILY WITH A MEAL 06/08/19  Yes Gherghe, Cristina, MD  montelukast (SINGULAIR) 10 MG tablet Take 10 mg by mouth at bedtime.   Yes [provider]  Olopatadine HCl 0.6 % SOLN USE 1 SPRAY INTO THE NOSE DAILY AS NEEDED FOR ALLERGIES 11/11/19  Yes Scott, Charlene, MD  ondansetron (ZOFRAN) 4 MG tablet Take 1 tablet (4 mg total) by mouth every 6 (six) hours. 12/23/19  Yes Tate, Kelly H, NP  pantoprazole (PROTONIX) 40 MG tablet Take 1 tablet (40 mg total) by mouth every morning. 07/18/19  Yes Scott, Charlene, MD  rifampin (RIFADIN) 300 MG capsule Take 300 mg by mouth See admin instructions. Take 1 capsule (300 mg) by mouth daily for 2 weeks for HS   Yes [provider]  sertraline (ZOLOFT) 100 MG tablet Take 100 mg by mouth daily. 12/30/19  Yes [provider]  traZODone (DESYREL) 50 MG tablet Take 1 tablet by mouth at bedtime. 09/29/19  Yes [provider]    triamcinolone ointment (KENALOG) 0.1 % Apply 1 application topically 2 (two) times daily as needed (applied to affected areas of scalp).   Yes [provider]  verapamil (CALAN-SR) 120 MG CR tablet Take 1 tablet (120 mg total) by mouth daily. 01/02/20  Yes Dale Vienna, MD  Monte Fantasia INHUB 250-50 MCG/DOSE AEPB INL 1 PUFF PO BID 03/11/19  Yes [provider]  Na Sulfate-K Sulfate-Mg Sulf 17.5-3.13-1.6 GM/177ML SOLN Take 354 mLs by mouth once for 1 dose. 01/16/20 01/16/20  Pasty Spillers, MD  Semaglutide (RYBELSUS) 7 MG TABS Take 7 mg by mouth daily. Patient not taking: Reported on 01/16/2020 12/26/19   Carlus Pavlov, MD    Family History  Problem Relation Age of Onset  . Hypertension Mother   . Diabetes Mother   . Asthma Brother   . Breast cancer Neg Hx   . Colon cancer Neg Hx      Social History   Tobacco Use  . Smoking status: Never Smoker  . Smokeless tobacco: Never Used  Substance Use  Topics  . Alcohol use: Not Currently    Alcohol/week: 0.0 standard drinks  . Drug use: No    Allergies as of 01/16/2020 - Review Complete 01/16/2020  Allergen Reaction Noted  . Ibuprofen Other (See Comments) 09/06/2012  . Caffeine Other (See Comments) 10/07/2012    Review of Systems:    All systems reviewed and negative except where noted in HPI.   Observations/Objective:  Labs: CBC    Component Value Date/Time   WBC 7.0 01/04/2020 1026   WBC 7.8 12/29/2019 2208   RBC 5.02 01/04/2020 1026   RBC 4.80 12/29/2019 2208   HGB 11.3 01/04/2020 1026   HCT 37.4 01/04/2020 1026   PLT 262 01/04/2020 1026   MCV 75 (L) 01/04/2020 1026   MCV 71 (L) 08/26/2012 1549   MCH 22.5 (L) 01/04/2020 1026   MCH 22.5 (L) 12/29/2019 2208   MCHC 30.2 (L) 01/04/2020 1026   MCHC 30.2 12/29/2019 2208   RDW 14.7 01/04/2020 1026   RDW 14.4 08/26/2012 1549   LYMPHSABS 2.5 01/04/2020 1026   MONOABS 0.7 09/02/2015 0621   EOSABS 0.1 01/04/2020 1026   BASOSABS 0.0 01/04/2020 1026   CMP     Component Value Date/Time   NA 137 01/04/2020 1026   NA 139 08/26/2012 1549   K 4.3 01/04/2020 1026   K 3.8 08/26/2012 1549   CL 102 01/04/2020 1026   CL 104 08/26/2012 1549   CO2 22 01/04/2020 1026   CO2 26 08/26/2012 1549   GLUCOSE 91 01/04/2020 1026   GLUCOSE 167 (H) 12/29/2019 2208   GLUCOSE 149 (H) 08/26/2012 1549   BUN 9 01/04/2020 1026   BUN 14 08/26/2012 1549   CREATININE 0.78 01/04/2020 1026   CREATININE 1.10 08/26/2012 1549   CALCIUM 9.0 01/04/2020 1026   CALCIUM 9.4 08/26/2012 1549   PROT 6.9 01/04/2020 1026   PROT 8.1 08/26/2012 1549   ALBUMIN 4.0 01/04/2020 1026   ALBUMIN 3.9 08/26/2012 1549   AST 17 01/04/2020 1026   AST 24 08/26/2012 1549   ALT 10 01/04/2020 1026   ALT 33 08/26/2012 1549   ALKPHOS 108 01/04/2020 1026   ALKPHOS 124 08/26/2012 1549   BILITOT 0.5 01/04/2020 1026   BILITOT 0.4 08/26/2012 1549   GFRNONAA 95 01/04/2020 1026   GFRNONAA >60 08/26/2012 1549   GFRAA 110  01/04/2020 1026   GFRAA >60 08/26/2012 1549    Imaging Studies: MR Brain W Wo Contrast  Result Date: 01/04/2020 CLINICAL DATA:  Frequent headaches, right neck pain, and hypertension. EXAM: MRI HEAD WITHOUT AND WITH CONTRAST TECHNIQUE: Multiplanar, multiecho pulse sequences of the brain and surrounding structures were obtained without and with intravenous contrast. CONTRAST:  61mL GADAVIST GADOBUTROL 1 MMOL/ML IV SOLN COMPARISON:  Head CT 11/10/2069 and MRI 12/09/2011 FINDINGS: Brain: There is no evidence of acute infarct, intracranial hemorrhage, mass, midline shift, or extra-axial fluid collection. The ventricles and sulci are normal. The brain is normal in signal. Vascular: Major intracranial vascular flow voids are preserved. Skull and upper cervical spine: Unremarkable bone marrow signal. Sinuses/Orbits: Unremarkable orbits. Paranasal sinuses and mastoid air cells are clear. Other: None. IMPRESSION: Negative brain MRI. Electronically Signed   By: Logan Bores M.D.   On: 01/04/2020 14:46    Assessment and Plan:   KYNZLEE HUCKER is a 39 y.o. y/o female has been referred for iron deficiency anemia, with history of duodenal switch with hiatal hernia repair without fundoplication in 1062 by Dr. Duke Salvia  Assessment and Plan: Last colonoscopy was 11 years ago, last EGD was 2 years ago. EGD and colonoscopy indicated for iron deficiency anemia  Patient also agreeable to referral to hematology for the same.  Given her chronically low MCV even when her hemoglobin is improved, may indicate that patient has another concomitant reason for low MCV such as thalasemmia.  Hematology to evaluate.  Iron deficiency possibly due to her anatomy.  However, she had iron deficiency present even in 2010 when her EGD and colonoscopy were done by Dr. Candace Cruise, and her gastrectomy was only in 2016.  Has never had a pill camera study.  This can be considered based on EGD and colonoscopy results and can be discussed on next visit  after procedures.  I have discussed alternative options, risks & benefits,  which include, but are not limited to, bleeding, infection, perforation,respiratory complication & drug reaction.  The patient agrees with this plan & written consent will be obtained.   Finding of fatty liver on imaging discussed with patient Diet, weight loss, and exercise encouraged along with avoiding hepatotoxic drugs including alcohol Risk of progression to cirrhosis if above measures are not instituted were discussed as well, and patient verbalized understanding  Patient states she received hepatitis a and B vaccine series with [place] [facility]. Liver enzymes are normal   Follow Up Instructions: 1-2 weeks post procedure  I discussed the assessment and treatment plan with the patient. The patient was provided an opportunity to ask questions and all were answered. The patient agreed with the plan and demonstrated an understanding of the instructions.   The patient was advised to call back or seek an in-person evaluation if the symptoms worsen or if the condition fails to improve as anticipated.  I provided 15 minutes of face-to-face time via video software during this encounter.  Additional time was spent in reviewing patient's chart, placing orders etc.   Virgel Manifold, MD  Speech recognition software was used to dictate the above note.

## 2020-01-16 NOTE — H&P (View-Only) (Signed)
Shirley [name] [address]  [address]  [place], [place] [address]  Main: [phone]  Fax: [phone]   Gastroenterology Consultation  Referring Provider:     Dale Ellston, MD Primary Care Physician:  Dale [place]. [place]air, MD Reason for Consultation:    Iron deficiency anemia        HPI:   Virtual Visit via Video Note  I connected with patient on 01/16/20 at 11:00 AM EST by video ([email or web address]) and verified that I am speaking with the correct person using two identifiers.   I discussed the limitations, risks, security and privacy concerns of performing an evaluation and management service by video and the availability of in person appointments. I also discussed with the patient that there may be a patient responsible charge related to this service. The patient expressed understanding and agreed to proceed.  Location of the patient: Home Location of provider: Home Participating persons: Patient and provider only (Nursing staff checked in patient via phone but were not physically involved in the video interaction - see their notes)   History of Present Illness: Chief Complaint  Patient presents with  . New Patient (Initial Visit)  . Anemia    Has been fatigue. no blood in stools   . Constipation    Patient state some constipation     Shirley [name] is a 39 y.o. y/o female referred for consultation & management  by Dr. Dale Notasulga, MD.  Patient referred for iron deficiency anemia.  Reports previously being on oral iron replacement intermittently but has not been on it for 2 to 3 months.  Denies menorrhagia.  Denies any sources of bleeding.  Most recent ferritin is low normal at 20.  Low MCV.  Most recent iron saturation was less than 45%.  Had a sleeve gastrectomy in 2016.  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  Had an EGD in March 2019 by bariatric surgeon, [name], at [facility] for upper  abdominal pain.  This reported changes consistent with sleeve gastrectomy, sliding hiatal hernia, 2 cm.  Preserved antrum following prior sleeve gastrectomy.  Sleeve itself was noted to be consistently tubular in diameter and slightly dilated relative to original shape, "especially above the nonrestrictive incisura."  Absence of esophagitis was also reported.  Barium swallow in February 2019 reported mild to moderate dilation of esophagus with minimal intraesophageal reflux, markedly dilated sleep specially above incisura with large air bubble and air-fluid level.  Moderate dilation of antrum.  Had an EGD and colonoscopy in 2010 with [name] for iron deficiency anemia and heme positive stools.  EGD showed few gastric polyps, and was otherwise normal.  Colonoscopy reported diverticulosis and was otherwise normal.  CT abdomen pelvis in 2014 reported hepatic steatosis.  Patient is aware of the fatty liver diagnosis and was told to lose weight prior to her sleeve gastrectomy.  Most recent liver enzymes are normal.  Reports she also had hepatitis a and B vaccine series at [facility] clinic already.  Past Medical History:  Diagnosis Date  . Anemia   . Asthma    WELL CONTROLLED  . Complication of anesthesia    -TACYCARDIA AFTER GASTRIC SURGERY IN 2016 AND HAD TO STAY 5 DAYS-itching after surgery 2005, 2006  . Depression   . Diabetes mellitus (HCC)   . Fatty liver   . GERD (gastroesophageal reflux disease)   . Heart murmur   . History of dermoid cyst excision   .  History of hiatal hernia   . Hypertension    H/O  . IBS (irritable bowel syndrome)   . Migraine   . PONV (postoperative nausea and vomiting)    nausea    Past Surgical History:  Procedure Laterality Date  . CHOLECYSTECTOMY  2005  . DILATATION & CURETTAGE/HYSTEROSCOPY WITH MYOSURE N/A 03/30/2018   Procedure: DILATATION & CURETTAGE/HYSTEROSCOPY;  Surgeon: Natale Milch, MD;  Location: [facility] ORS;  Service: Gynecology;  Laterality:  N/A;  . LAPAROSCOPIC GASTRIC RESTRICTIVE DUODENAL PROCEDURE (DUODENAL SWITCH) N/A 08/28/2015   Procedure: LAPAROSCOPIC GASTRIC RESTRICTIVE DUODENAL PROCEDURE (DUODENAL SWITCH);  Surgeon: [name], MD;  Location: [facility] ORS;  Service: General;  Laterality: N/A;  . 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 Authorizing Provider  albuterol (PROVENTIL HFA;VENTOLIN HFA) 108 (90 Base) MCG/ACT inhaler Inhale 2 puffs into the lungs every 6 (six) hours as needed for wheezing or shortness of breath. Reported on 05/01/2016 08/20/16  Yes Dale Williston, MD  azelastine (OPTIVAR) 0.05 % ophthalmic solution Place 1 drop into both eyes 2 (two) times daily as needed (for allergic eye). Reported on 05/01/2016   Yes [provider]  clindamycin (CLEOCIN T) 1 % external solution Apply 1 application topically 2 (two) times daily as needed (applied to breast, groin, and underarms as needed.).   Yes [provider]  clindamycin (CLEOCIN) 300 MG capsule Take 300 mg by mouth See admin instructions. Take 1 capsule (300 mg) daily for 2 weeks as needed HS   Yes [provider]  diclofenac Sodium (VOLTAREN) 1 % GEL Apply topically. 02/16/19  Yes [provider]  glucose blood (PRECISION QID TEST) test strip Use 1 strip  twice a day  [dx 250.02]   Yes [provider]  ketoconazole (NIZORAL) 2 % shampoo Apply 1 application topically 3 (three) times a week. Use 2-3 times a week. Leave in for 5 minutes before rinsing out   Yes [provider]  lamoTRIgine (LAMICTAL) 100 MG tablet  09/29/19  Yes [provider]  levocetirizine (XYZAL) 5 MG tablet Take 1 tablet (5 mg total) by mouth every evening. Reported on 05/01/2016 08/20/16  Yes Dale Mora, MD  medroxyPROGESTERone (DEPO-PROVERA) 150 MG/ML injection Inject 1 mL (150 mg total) into the muscle every 3 (three) months. 01/06/20 04/05/20 Yes Schuman,  Christanna R, MD  metFORMIN (GLUCOPHAGE-XR) 500 MG 24 hr tablet TAKE 1 TABLET(500 MG) BY MOUTH TWICE DAILY WITH A MEAL 06/08/19  Yes Carlus Pavlov, MD  montelukast (SINGULAIR) 10 MG tablet Take 10 mg by mouth at bedtime.   Yes [provider]  Olopatadine HCl 0.6 % SOLN USE 1 SPRAY INTO THE NOSE DAILY AS NEEDED FOR ALLERGIES 11/11/19  Yes Dale [place], MD  ondansetron (ZOFRAN) 4 MG tablet Take 1 tablet (4 mg total) by mouth every 6 (six) hours. 12/23/19  Yes Mickie Bail, NP  pantoprazole (PROTONIX) 40 MG tablet Take 1 tablet (40 mg total) by mouth every morning. 07/18/19  Yes Dale Myrtle, MD  rifampin (RIFADIN) 300 MG capsule Take 300 mg by mouth See admin instructions. Take 1 capsule (300 mg) by mouth daily for 2 weeks for HS   Yes [provider]  sertraline (ZOLOFT) 100 MG tablet Take 100 mg by mouth daily. 12/30/19  Yes [provider]  traZODone (DESYREL) 50 MG tablet Take 1 tablet by mouth at bedtime. 09/29/19  Yes [provider]  triamcinolone ointment (KENALOG) 0.1 % Apply 1 application topically 2 (two) times daily as needed (applied to affected areas of scalp).   Yes [provider]  verapamil (CALAN-SR) 120 MG CR tablet Take 1 tablet (120 mg total) by mouth daily. 01/02/20  Yes Dale [place], MD  Monte Fantasia INHUB 250-50 MCG/DOSE AEPB INL 1 PUFF PO BID 03/11/19  Yes [provider]  Na Sulfate-K Sulfate-Mg Sulf 17.5-3.13-1.6 GM/177ML SOLN Take 354 mLs by mouth once for 1 dose. 01/16/20 01/16/20  Pasty Spillers, MD  Semaglutide (RYBELSUS) 7 MG TABS Take 7 mg by mouth daily. Patient not taking: Reported on 01/16/2020 12/26/19   Carlus Pavlov, MD    Family History  Problem Relation Age of Onset  . Hypertension Mother   . Diabetes Mother   . Asthma Brother   . Breast cancer Neg Hx   . Colon cancer Neg Hx      Social History   Tobacco Use  . Smoking status: Never Smoker  . Smokeless tobacco: Never Used  Substance Use  Topics  . Alcohol use: Not Currently    Alcohol/week: 0.0 standard drinks  . Drug use: No    Allergies as of 01/16/2020 - Review Complete 01/16/2020  Allergen Reaction Noted  . Ibuprofen Other (See Comments) 09/06/2012  . Caffeine Other (See Comments) 10/07/2012    Review of Systems:    All systems reviewed and negative except where noted in HPI.   Observations/Objective:  Labs: CBC    Component Value Date/Time   WBC 7.0 01/04/2020 1026   WBC 7.8 12/29/2019 2208   RBC 5.02 01/04/2020 1026   RBC 4.80 12/29/2019 2208   HGB 11.3 01/04/2020 1026   HCT 37.4 01/04/2020 1026   PLT 262 01/04/2020 1026   MCV 75 (L) 01/04/2020 1026   MCV 71 (L) 08/26/2012 1549   MCH 22.5 (L) 01/04/2020 1026   MCH 22.5 (L) 12/29/2019 2208   MCHC 30.2 (L) 01/04/2020 1026   MCHC 30.2 12/29/2019 2208   RDW 14.7 01/04/2020 1026   RDW 14.4 08/26/2012 1549   LYMPHSABS 2.5 01/04/2020 1026   MONOABS 0.7 09/02/2015 0621   EOSABS 0.1 01/04/2020 1026   BASOSABS 0.0 01/04/2020 1026   CMP     Component Value Date/Time   NA 137 01/04/2020 1026   NA 139 08/26/2012 1549   K 4.3 01/04/2020 1026   K 3.8 08/26/2012 1549   CL 102 01/04/2020 1026   CL 104 08/26/2012 1549   CO2 22 01/04/2020 1026   CO2 26 08/26/2012 1549   GLUCOSE 91 01/04/2020 1026   GLUCOSE 167 (H) 12/29/2019 2208   GLUCOSE 149 (H) 08/26/2012 1549   BUN 9 01/04/2020 1026   BUN 14 08/26/2012 1549   CREATININE 0.78 01/04/2020 1026   CREATININE 1.10 08/26/2012 1549   CALCIUM 9.0 01/04/2020 1026   CALCIUM 9.4 08/26/2012 1549   PROT 6.9 01/04/2020 1026   PROT 8.1 08/26/2012 1549   ALBUMIN 4.0 01/04/2020 1026   ALBUMIN 3.9 08/26/2012 1549   AST 17 01/04/2020 1026   AST 24 08/26/2012 1549   ALT 10 01/04/2020 1026   ALT 33 08/26/2012 1549   ALKPHOS 108 01/04/2020 1026   ALKPHOS 124 08/26/2012 1549   BILITOT 0.5 01/04/2020 1026   BILITOT 0.4 08/26/2012 1549   GFRNONAA 95 01/04/2020 1026   GFRNONAA >60 08/26/2012 1549   GFRAA 110  01/04/2020 1026   GFRAA >60 08/26/2012 1549    Imaging Studies: MR Brain W Wo Contrast  Result Date: 01/04/2020 CLINICAL DATA:  Frequent headaches, right neck pain, and hypertension. EXAM: MRI HEAD WITHOUT AND WITH CONTRAST TECHNIQUE: Multiplanar, multiecho pulse sequences of the brain and surrounding structures were obtained without and with intravenous contrast. CONTRAST:  61mL GADAVIST GADOBUTROL 1 MMOL/ML IV SOLN COMPARISON:  Head CT 11/10/2069 and MRI 12/09/2011 FINDINGS: Brain: There is no evidence of acute infarct, intracranial hemorrhage, mass, midline shift, or extra-axial fluid collection. The ventricles and sulci are normal. The brain is normal in signal. Vascular: Major intracranial vascular flow voids are preserved. Skull and upper cervical spine: Unremarkable bone marrow signal. Sinuses/Orbits: Unremarkable orbits. Paranasal sinuses and mastoid air cells are clear. Other: None. IMPRESSION: Negative brain MRI. Electronically Signed   By: Logan Bores M.D.   On: 01/04/2020 14:46    Assessment and Plan:   Shirley [name] is a 39 y.o. y/o female has been referred for iron deficiency anemia, with history of duodenal switch with hiatal hernia repair without fundoplication in 1062 by Dr. Duke Salvia  Assessment and Plan: Last colonoscopy was 11 years ago, last EGD was 2 years ago. EGD and colonoscopy indicated for iron deficiency anemia  Patient also agreeable to referral to hematology for the same.  Given her chronically low MCV even when her hemoglobin is improved, may indicate that patient has another concomitant reason for low MCV such as thalasemmia.  Hematology to evaluate.  Iron deficiency possibly due to her anatomy.  However, she had iron deficiency present even in 2010 when her EGD and colonoscopy were done by Dr. Candace Cruise, and her gastrectomy was only in 2016.  Has never had a pill camera study.  This can be considered based on EGD and colonoscopy results and can be discussed on next visit  after procedures.  I have discussed alternative options, risks & benefits,  which include, but are not limited to, bleeding, infection, perforation,respiratory complication & drug reaction.  The patient agrees with this plan & written consent will be obtained.   Finding of fatty liver on imaging discussed with patient Diet, weight loss, and exercise encouraged along with avoiding hepatotoxic drugs including alcohol Risk of progression to cirrhosis if above measures are not instituted were discussed as well, and patient verbalized understanding  Patient states she received hepatitis a and B vaccine series with [facility]. Liver enzymes are normal   Follow Up Instructions: 1-2 weeks post procedure  I discussed the assessment and treatment plan with the patient. The patient was provided an opportunity to ask questions and all were answered. The patient agreed with the plan and demonstrated an understanding of the instructions.   The patient was advised to call back or seek an in-person evaluation if the symptoms worsen or if the condition fails to improve as anticipated.  I provided 15 minutes of face-to-face time via video software during this encounter.  Additional time was spent in reviewing patient's chart, placing orders etc.   Virgel Manifold, MD  Speech recognition software was used to dictate the above note.

## 2020-01-19 ENCOUNTER — Ambulatory Visit: Payer: Managed Care, Other (non HMO) | Admitting: Internal Medicine

## 2020-01-19 ENCOUNTER — Other Ambulatory Visit
Admission: RE | Admit: 2020-01-19 | Discharge: 2020-01-19 | Disposition: A | Discharge status: Home / Self Care | Payer: Managed Care, Other (non HMO) | Source: Ambulatory Visit | Attending: Gastroenterology | Admitting: Gastroenterology

## 2020-01-19 ENCOUNTER — Other Ambulatory Visit: Payer: Self-pay

## 2020-01-19 DIAGNOSIS — Z20822 Contact with and (suspected) exposure to covid-19: Secondary | ICD-10-CM | POA: Diagnosis not present

## 2020-01-19 DIAGNOSIS — Z01812 Encounter for preprocedural laboratory examination: Secondary | ICD-10-CM | POA: Insufficient documentation

## 2020-01-19 LAB — SARS CORONAVIRUS 2 (TAT 6-24 HRS): SARS Coronavirus 2: NEGATIVE

## 2020-01-23 ENCOUNTER — Ambulatory Visit
Admission: RE | Admit: 2020-01-23 | Discharge: 2020-01-23 | Disposition: A | Discharge status: Home / Self Care | Payer: Managed Care, Other (non HMO) | Attending: Gastroenterology | Admitting: Gastroenterology

## 2020-01-23 ENCOUNTER — Encounter
Admission: RE | Disposition: A | Discharge status: Home / Self Care | Payer: Self-pay | Source: Home / Self Care | Attending: Gastroenterology

## 2020-01-23 ENCOUNTER — Ambulatory Visit: Payer: Managed Care, Other (non HMO) | Admitting: Anesthesiology

## 2020-01-23 ENCOUNTER — Encounter: Payer: Self-pay | Admitting: Gastroenterology

## 2020-01-23 ENCOUNTER — Other Ambulatory Visit: Payer: Self-pay

## 2020-01-23 DIAGNOSIS — Z7984 Long term (current) use of oral hypoglycemic drugs: Secondary | ICD-10-CM | POA: Insufficient documentation

## 2020-01-23 DIAGNOSIS — Z9884 Bariatric surgery status: Secondary | ICD-10-CM | POA: Diagnosis not present

## 2020-01-23 DIAGNOSIS — K635 Polyp of colon: Secondary | ICD-10-CM

## 2020-01-23 DIAGNOSIS — K640 First degree hemorrhoids: Secondary | ICD-10-CM | POA: Insufficient documentation

## 2020-01-23 DIAGNOSIS — Z79899 Other long term (current) drug therapy: Secondary | ICD-10-CM | POA: Insufficient documentation

## 2020-01-23 DIAGNOSIS — J45909 Unspecified asthma, uncomplicated: Secondary | ICD-10-CM | POA: Insufficient documentation

## 2020-01-23 DIAGNOSIS — F329 Major depressive disorder, single episode, unspecified: Secondary | ICD-10-CM | POA: Insufficient documentation

## 2020-01-23 DIAGNOSIS — K5790 Diverticulosis of intestine, part unspecified, without perforation or abscess without bleeding: Secondary | ICD-10-CM | POA: Diagnosis not present

## 2020-01-23 DIAGNOSIS — K449 Diaphragmatic hernia without obstruction or gangrene: Secondary | ICD-10-CM | POA: Diagnosis not present

## 2020-01-23 DIAGNOSIS — I10 Essential (primary) hypertension: Secondary | ICD-10-CM | POA: Diagnosis not present

## 2020-01-23 DIAGNOSIS — D509 Iron deficiency anemia, unspecified: Secondary | ICD-10-CM

## 2020-01-23 DIAGNOSIS — E119 Type 2 diabetes mellitus without complications: Secondary | ICD-10-CM | POA: Insufficient documentation

## 2020-01-23 DIAGNOSIS — K219 Gastro-esophageal reflux disease without esophagitis: Secondary | ICD-10-CM | POA: Diagnosis not present

## 2020-01-23 DIAGNOSIS — Z7951 Long term (current) use of inhaled steroids: Secondary | ICD-10-CM | POA: Diagnosis not present

## 2020-01-23 HISTORY — PX: ESOPHAGOGASTRODUODENOSCOPY (EGD) WITH PROPOFOL: SHX5813

## 2020-01-23 HISTORY — PX: COLONOSCOPY WITH PROPOFOL: SHX5780

## 2020-01-23 LAB — POCT PREGNANCY, URINE: Preg Test, Ur: NEGATIVE

## 2020-01-23 SURGERY — COLONOSCOPY WITH PROPOFOL
Anesthesia: General

## 2020-01-23 MED ORDER — ONDANSETRON HCL 4 MG/2ML IJ SOLN
INTRAMUSCULAR | Status: DC | PRN
  Administered 2020-01-23: 4 mg via INTRAVENOUS

## 2020-01-23 MED ORDER — MIDAZOLAM HCL 2 MG/2ML IJ SOLN
INTRAMUSCULAR | Status: DC | PRN
  Administered 2020-01-23: 2 mg via INTRAVENOUS

## 2020-01-23 MED ORDER — PROPOFOL 10 MG/ML IV BOLUS
INTRAVENOUS | Status: DC | PRN
  Administered 2020-01-23: 80 mg via INTRAVENOUS

## 2020-01-23 MED ORDER — PROPOFOL 500 MG/50ML IV EMUL
INTRAVENOUS | Status: AC
  Filled 2020-01-23: qty 50

## 2020-01-23 MED ORDER — ONDANSETRON HCL 4 MG/2ML IJ SOLN
INTRAMUSCULAR | Status: AC
  Filled 2020-01-23: qty 2

## 2020-01-23 MED ORDER — PROPOFOL 500 MG/50ML IV EMUL
INTRAVENOUS | Status: DC | PRN
  Administered 2020-01-23: 150 ug/kg/min via INTRAVENOUS

## 2020-01-23 MED ORDER — MIDAZOLAM HCL 2 MG/2ML IJ SOLN
INTRAMUSCULAR | Status: AC
  Filled 2020-01-23: qty 2

## 2020-01-23 MED ORDER — SODIUM CHLORIDE 0.9 % IV SOLN
INTRAVENOUS | Status: DC
  Administered 2020-01-23: 1000 mL via INTRAVENOUS

## 2020-01-23 NOTE — Anesthesia Preprocedure Evaluation (Signed)
Anesthesia Evaluation  Patient identified by MRN, date of birth, ID band Patient awake    Reviewed: Allergy & Precautions, H&P , NPO status , Patient's Chart, lab work & pertinent test results, reviewed documented beta blocker date and time   History of Anesthesia Complications (+) PONV and history of anesthetic complications  Airway Mallampati: II   Neck ROM: full    Dental  (+) Poor Dentition   Pulmonary asthma ,    Pulmonary exam normal        Cardiovascular Exercise Tolerance: Poor hypertension, On Medications Normal cardiovascular exam+ Valvular Problems/Murmurs  Rhythm:regular Rate:Normal     Neuro/Psych  Headaches, PSYCHIATRIC DISORDERS Anxiety Depression    GI/Hepatic Neg liver ROS, hiatal hernia, GERD  Medicated,  Endo/Other  diabetes, Well Controlled, Type 2, Oral Hypoglycemic AgentsMorbid obesity  Renal/GU negative Renal ROS  negative genitourinary   Musculoskeletal   Abdominal   Peds  Hematology  (+) Blood dyscrasia, anemia ,   Anesthesia Other Findings Past Medical History: No date: Anemia No date: Asthma     Comment:  WELL CONTROLLED No date: Complication of anesthesia     Comment:  -TACYCARDIA AFTER GASTRIC SURGERY IN 2016 AND HAD TO               STAY 5 DAYS-itching after surgery 2005, 2006 No date: Depression No date: Diabetes mellitus (HCC) No date: Fatty liver No date: GERD (gastroesophageal reflux disease) No date: Heart murmur No date: History of dermoid cyst excision No date: History of hiatal hernia No date: Hypertension     Comment:  H/O No date: IBS (irritable bowel syndrome) No date: Migraine No date: PONV (postoperative nausea and vomiting)     Comment:  nausea Past Surgical History: 2005: CHOLECYSTECTOMY 03/30/2018: DILATATION & CURETTAGE/HYSTEROSCOPY WITH MYOSURE; N/A     Comment:  Procedure: DILATATION & CURETTAGE/HYSTEROSCOPY;                Surgeon: Natale Milch, MD;  Location: [facility] ORS;               Service: Gynecology;  Laterality: N/A; 08/28/2015: LAPAROSCOPIC GASTRIC RESTRICTIVE DUODENAL PROCEDURE  (DUODENAL SWITCH); N/A     Comment:  Procedure: LAPAROSCOPIC GASTRIC RESTRICTIVE DUODENAL               PROCEDURE (DUODENAL SWITCH);  Surgeon: [name],               MD;  Location: [facility] ORS;  Service: General;  Laterality:               N/A; 2006: TUMOR REMOVAL     Comment:  dermoid tumor BMI    Body Mass Index: 43.46 kg/m     Reproductive/Obstetrics negative OB ROS                             Anesthesia Physical Anesthesia Plan  ASA: III  Anesthesia Plan: General   Post-op Pain Management:    Induction:   PONV Risk Score and Plan:   Airway Management Planned:   Additional Equipment:   Intra-op Plan:   Post-operative Plan:   Informed Consent: I have reviewed the patients History and Physical, chart, labs and discussed the procedure including the risks, benefits and alternatives for the proposed anesthesia with the patient or authorized representative who has indicated his/her understanding and acceptance.     Dental Advisory Given  Plan Discussed with: CRNA  Anesthesia Plan Comments:  Anesthesia Quick Evaluation  

## 2020-01-23 NOTE — Discharge Instructions (Signed)
   Midge Minium, MD, [facility] [address] [place], [place] [address] Phone: [phone] Fax : [phone]   YOU HAD AN ENDOSCOPIC PROCEDURE TODAY: Refer to the procedure report that was given to you for any specific questions about what was found during the examination.  If the procedure report does not answer your questions, please call your gastroenterologist to clarify.  YOU SHOULD EXPECT: Some feelings of bloating in the abdomen. Passage of more gas than usual.  Walking can help get rid of the air that was put into your GI tract during the procedure and reduce the bloating. If you had a lower endoscopy (such as a colonoscopy or flexible sigmoidoscopy) you may notice spotting of blood in your stool or on the toilet paper.   DIET: Your first meal following the procedure should be a light meal and then it is ok to progress to your normal diet.  A half-sandwich or bowl of soup is an example of a good first meal.  Heavy or fried foods are harder to digest and may make you feel nasueas or bloated.  Drink plenty of fluids but you should avoid alcoholic beverages for 24 hours.  ACTIVITY: Your care partner should take you home directly after the procedure.  You should plan to take it easy, moving slowly for the rest of the day.  You can resume normal activity the day after the procedure however you should NOT DRIVE, make legal decisions or use heavy machinery for 24 hours (because of the sedation medicines used during the test).    SYMPTOMS TO REPORT IMMEDIATELY  A gastroenterologist can be reached at any hour.  Please call your doctor's office for any of the following symptoms:   Following lower endoscopy (colonoscopy, flexible sigmoidoscopy)  Excessive amounts of blood in the stool  Significant tenderness, worsening of abdominal pains  Swelling of the abdomen that is new, acute  Fever of 100 or higher  Following upper endoscopy (EGD, EUS, ERCP)  Vomiting of blood or coffee ground  material  New, significant abdominal pain  New, significant chest pain or pain under the shoulder blades  Painful or persistently difficult swallowing  New shortness of breath  Black, tarry-looking stools  FOLLOW UP: If any biopsies were taken you will be contacted by phone or by letter within the next 1-3 weeks.  Call your gastroenterologist if you have not heard about the biopsies in 3 weeks.   Please also call your gastroenterologist's office with any specific questions about appointments or follow up tests.

## 2020-01-23 NOTE — Op Note (Signed)
[facility] Patient Name: Shirley [name] Procedure Date: 01/23/2020 8:38 AM MRN: [id number] Account #: [id number] Date of Birth: Jul 18, 1981 Admit Type: Outpatient Age: 38 Room: [facility] ENDO ROOM 4 Gender: Female Note Status: Finalized Procedure:             Upper GI endoscopy Indications:           Iron deficiency anemia Providers:             Midge Minium MD, MD Referring MD:          Dale [place], MD (Referring MD) Medicines:             Propofol per Anesthesia Complications:         No immediate complications. Procedure:             Pre-Anesthesia Assessment:                        - Prior to the procedure, a History and Physical was                         performed, and patient medications and allergies were                         reviewed. The patient's tolerance of previous                         anesthesia was also reviewed. The risks and benefits                         of the procedure and the sedation options and risks                         were discussed with the patient. All questions were                         answered, and informed consent was obtained. Prior                         Anticoagulants: The patient has taken no previous                         anticoagulant or antiplatelet agents. ASA Grade                         Assessment: II - A patient with mild systemic disease.                         After reviewing the risks and benefits, the patient                         was deemed in satisfactory condition to undergo the                         procedure.                        After obtaining informed consent, the endoscope was  passed under direct vision. Throughout the procedure,                         the patient's blood pressure, pulse, and oxygen                         saturations were monitored continuously. The Endoscope                         was introduced through the mouth, and  advanced to the                         jejunum. The upper GI endoscopy was accomplished                         without difficulty. The patient tolerated the                         procedure well. Findings:      A small hiatal hernia was present.      Evidence of a duodenal switch was found in the entire examined stomach.      The in the duodenum was normal. Impression:            - Small hiatal hernia.                        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                        - Normal.                        - No specimens collected. Recommendation:        - Discharge patient to home.                        - Resume previous diet.                        - Continue present medications.                        - Perform a colonoscopy today. Procedure Code(s):     --- Professional ---                        [phone], Esophagogastroduodenoscopy, flexible,                         transoral; diagnostic, including collection of                         specimen(s) by brushing or washing, when performed                         (separate procedure) Diagnosis Code(s):     --- Professional ---                        D50.9, Iron deficiency anemia, unspecified CPT copyright 2019 American Medical Association. All rights reserved. The codes documented in this report are preliminary and upon coder review  may  be revised to meet current compliance requirements. Midge Minium MD, MD 01/23/2020 8:49:44 AM This report has been signed electronically. Number of Addenda: 0 Note Initiated On: 01/23/2020 8:38 AM Estimated Blood Loss:  Estimated blood loss: none.      [facility]

## 2020-01-23 NOTE — Transfer of Care (Signed)
Immediate Anesthesia Transfer of Care Note  Patient: Shirley [name]  Procedure(s) Performed: COLONOSCOPY WITH PROPOFOL (N/A ) ESOPHAGOGASTRODUODENOSCOPY (EGD) WITH PROPOFOL (N/A )  Patient Location: Endoscopy Unit  Anesthesia Type:General  Level of Consciousness: drowsy and patient cooperative  Airway & Oxygen Therapy: Patient Spontanous Breathing  Post-op Assessment: Report given to RN and Post -op Vital signs reviewed and stable  Post vital signs: Reviewed and stable  Last Vitals:  Vitals Value Taken Time  BP 105/59 01/23/20 0911  Temp    Pulse 90 01/23/20 0911  Resp 31 01/23/20 0911  SpO2 100 % 01/23/20 0911  Vitals shown include unvalidated device data.  Last Pain:  Vitals:   01/23/20 0822  TempSrc: Temporal  PainSc: 1          Complications: No apparent anesthesia complications

## 2020-01-23 NOTE — Op Note (Signed)
[facility] Patient Name: Shirley [name] Procedure Date: 01/23/2020 8:38 AM MRN: [id number] Account #: [id number] Date of Birth: [birth year] Admit Type: Outpatient Age: 39 Room: [facility] ENDO ROOM 4 Gender: Female Note Status: Finalized Procedure:             Colonoscopy Indications:           Iron deficiency anemia Providers:             Midge Minium MD, MD Referring MD:          Dale Ransom, MD (Referring MD) Medicines:             Propofol per Anesthesia Complications:         No immediate complications. Procedure:             Pre-Anesthesia Assessment:                        - Prior to the procedure, a History and Physical was                         performed, and patient medications and allergies were                         reviewed. The patient's tolerance of previous                         anesthesia was also reviewed. The risks and benefits                         of the procedure and the sedation options and risks                         were discussed with the patient. All questions were                         answered, and informed consent was obtained. Prior                         Anticoagulants: The patient has taken no previous                         anticoagulant or antiplatelet agents. ASA Grade                         Assessment: II - A patient with mild systemic disease.                         After reviewing the risks and benefits, the patient                         was deemed in satisfactory condition to undergo the                         procedure.                        After obtaining informed consent, the colonoscope was  passed under direct vision. Throughout the procedure,                         the patient's blood pressure, pulse, and oxygen                         saturations were monitored continuously. The                         Colonoscope was introduced through the anus and               advanced to the the cecum, identified by appendiceal                         orifice and ileocecal valve. The colonoscopy was                         performed without difficulty. The patient tolerated                         the procedure well. The quality of the bowel                         preparation was excellent. Findings:      The perianal and digital rectal examinations were normal.      A 2 mm polyp was found in the descending colon. The polyp was sessile.       The polyp was removed with a cold biopsy forceps. Resection and       retrieval were complete.      A few small-mouthed diverticula were found in the entire colon.      Non-bleeding internal hemorrhoids were found during retroflexion. The       hemorrhoids were Grade I (internal hemorrhoids that do not prolapse). Impression:            - One 2 mm polyp in the descending colon, removed with                         a cold biopsy forceps. Resected and retrieved.                        - Diverticulosis in the entire examined colon.                        - Non-bleeding internal hemorrhoids. Recommendation:        - Discharge patient to home.                        - Resume previous diet.                        - Continue present medications.                        - Await pathology results.                        - Repeat colonoscopy in 5 years if polyp adenoma and  10 years if hyperplastic Procedure Code(s):     --- Professional ---                        [phone], Colonoscopy, flexible; with biopsy, single or                         multiple Diagnosis Code(s):     --- Professional ---                        D50.9, Iron deficiency anemia, unspecified                        K63.5, Polyp of colon CPT copyright 2019 American [employer] Association. All rights reserved. The codes documented in this report are preliminary and upon coder review may  be revised to meet current compliance  requirements. Midge Minium MD, MD 01/23/2020 9:06:14 AM This report has been signed electronically. Number of Addenda: 0 Note Initiated On: 01/23/2020 8:38 AM Scope Withdrawal Time: 0 hours 9 minutes 0 seconds  Total Procedure Duration: 0 hours 12 minutes 12 seconds  Estimated Blood Loss:  Estimated blood loss: none.      [facility]

## 2020-01-23 NOTE — Interval H&P Note (Signed)
History and Physical Interval Note:  01/23/2020 8:36 AM  Shirley [name]  has presented today for surgery, with the diagnosis of Colonoscopy and EGD Iron def anemia D50.9.  The various methods of treatment have been discussed with the patient and family. After consideration of risks, benefits and other options for treatment, the patient has consented to  Procedure(s): COLONOSCOPY WITH PROPOFOL (N/A) ESOPHAGOGASTRODUODENOSCOPY (EGD) WITH PROPOFOL (N/A) as a surgical intervention.  The patient's history has been reviewed, patient examined, no change in status, stable for surgery.  I have reviewed the patient's chart and labs.  Questions were answered to the patient's satisfaction.     Shanoah Asbill [employer]

## 2020-01-23 NOTE — Anesthesia Postprocedure Evaluation (Signed)
Anesthesia Post Note  Patient: Shirley [name]  Procedure(s) Performed: COLONOSCOPY WITH PROPOFOL (N/A ) ESOPHAGOGASTRODUODENOSCOPY (EGD) WITH PROPOFOL (N/A )  Patient location during evaluation: PACU Anesthesia Type: General Level of consciousness: awake and alert Pain management: pain level controlled Vital Signs Assessment: post-procedure vital signs reviewed and stable Respiratory status: spontaneous breathing, nonlabored ventilation and respiratory function stable Cardiovascular status: blood pressure returned to baseline and stable Postop Assessment: no apparent nausea or vomiting Anesthetic complications: no     Last Vitals:  Vitals:   01/23/20 0920 01/23/20 0930  BP: 113/72 105/72  Pulse: 81 78  Resp: (!) 21 (!) 28  Temp:    SpO2: 100% 99%    Last Pain:  Vitals:   01/23/20 0822  TempSrc: Temporal  PainSc: 1                  Karleen Hampshire

## 2020-01-24 ENCOUNTER — Encounter: Payer: Self-pay | Admitting: *Deleted

## 2020-01-24 LAB — SURGICAL PATHOLOGY

## 2020-01-25 ENCOUNTER — Other Ambulatory Visit: Payer: Self-pay

## 2020-01-25 ENCOUNTER — Telehealth: Payer: Self-pay

## 2020-01-25 DIAGNOSIS — D500 Iron deficiency anemia secondary to blood loss (chronic): Secondary | ICD-10-CM

## 2020-01-25 NOTE — Telephone Encounter (Signed)
Pt notified of procedure results and scheduled for a capsule study at [facility] on 02/09/20.

## 2020-01-25 NOTE — Telephone Encounter (Signed)
-----   Message from Midge Minium, MD sent at 01/25/2020  6:11 AM EST ----- Let the patient know that his colon polyp was benign and she does not need another colonoscopy for 10 years.  Due to the iron deficiency she should be set up for a capsule endoscopy

## 2020-01-30 ENCOUNTER — Telehealth (INDEPENDENT_AMBULATORY_CARE_PROVIDER_SITE_OTHER): Payer: Managed Care, Other (non HMO) | Admitting: Internal Medicine

## 2020-01-30 DIAGNOSIS — K219 Gastro-esophageal reflux disease without esophagitis: Secondary | ICD-10-CM

## 2020-01-30 DIAGNOSIS — F411 Generalized anxiety disorder: Secondary | ICD-10-CM | POA: Diagnosis not present

## 2020-01-30 DIAGNOSIS — E119 Type 2 diabetes mellitus without complications: Secondary | ICD-10-CM | POA: Diagnosis not present

## 2020-01-30 DIAGNOSIS — I10 Essential (primary) hypertension: Secondary | ICD-10-CM

## 2020-01-30 DIAGNOSIS — E559 Vitamin D deficiency, unspecified: Secondary | ICD-10-CM

## 2020-01-30 DIAGNOSIS — D509 Iron deficiency anemia, unspecified: Secondary | ICD-10-CM | POA: Diagnosis not present

## 2020-01-30 DIAGNOSIS — R519 Headache, unspecified: Secondary | ICD-10-CM

## 2020-01-30 DIAGNOSIS — F331 Major depressive disorder, recurrent, moderate: Secondary | ICD-10-CM

## 2020-01-30 MED ORDER — PANTOPRAZOLE SODIUM 40 MG PO TBEC: 40.0000 mg | DELAYED_RELEASE_TABLET | ORAL | 1 refills | Status: DC

## 2020-01-30 NOTE — Progress Notes (Signed)
Patient ID: Shirley [name], female   DOB: [birth year], 39 y.o.   MRN: [id number]   Virtual Visit via video Note  This visit type was conducted due to national recommendations for restrictions regarding the COVID-19 pandemic (e.g. social distancing).  This format is felt to be most appropriate for this patient at this time.  All issues noted in this document were discussed and addressed.  No physical exam was performed (except for noted visual exam findings with Video Visits).   I connected with Shirley [name] by a video enabled telemedicine application and verified that I am speaking with the correct person using two identifiers. Location patient: home Location provider: work Persons participating in the virtual visit: patient, provider  The limitations, risks, security and privacy concerns of performing an evaluation and management service by telephone and the availability of in person appointments have been discussed.   The patient expressed understanding and agreed to proceed.   Reason for visit: scheduled follow up.    HPI: Here for scheduled follow up.  Has been having issues with headache and elevated blood pressure.  Last visit with me, I started her on verapamil.   Seeing Dr Manuella Ghazi - migraine. Recommended continuing verapamil.  MRI - unremarkable.  Discussed starting sumatriptan to abort headache.  Reports headaches are better.  Not a significant issue for her now.  No dizziness or light headedness.  No nausea or vomiting.  Taking protonix.  No upper symptoms reported.  No acid reflux.  No abdominal pain.  Bowels moving.  Stats am sugars 99 and pm sugars 120-130s.  Not taking iron.  Seeing psychiatry - on zoloft and lamictal.  Feels stable.        ROS: See pertinent positives and negatives per HPI.  Past Medical History:  Diagnosis Date  . Anemia   . Asthma    WELL CONTROLLED  . Complication of anesthesia    -TACYCARDIA AFTER GASTRIC SURGERY IN 2016 AND HAD TO STAY 5 DAYS-itching  after surgery 2005, 2006  . Depression   . Diabetes mellitus ([place])   . Fatty liver   . GERD (gastroesophageal reflux disease)   . Heart murmur   . History of dermoid cyst excision   . History of hiatal hernia   . Hypertension    H/O  . IBS (irritable bowel syndrome)   . Migraine   . PONV (postoperative nausea and vomiting)    nausea    Past Surgical History:  Procedure Laterality Date  . CHOLECYSTECTOMY  2005  . COLONOSCOPY WITH PROPOFOL N/A 01/23/2020   Procedure: COLONOSCOPY WITH PROPOFOL;  Surgeon: Lucilla Lame, MD;  Location: [facility] END[facility]COPY;  Service: Endoscopy;  Laterality: N/A;  . DILATATION & CURETTAGE/HYSTEROSCOPY WITH MYOSURE N/A 03/30/2018   Procedure: DILATATION & CURETTAGE/HYSTEROSCOPY;  Surgeon: Homero Fellers, MD;  Location: [facility] ORS;  Service: Gynecology;  Laterality: N/A;  . ESOPHAGOGASTRODUODENOSCOPY (EGD) WITH PROPOFOL N/A 01/23/2020   Procedure: ESOPHAGOGASTRODUODENOSCOPY (EGD) WITH PROPOFOL;  Surgeon: Lucilla Lame, MD;  Location: [facility] ENDOSCOPY;  Service: Endoscopy;  Laterality: N/A;  . LAPAROSCOPIC GASTRIC RESTRICTIVE DUODENAL PROCEDURE (DUODENAL SWITCH) N/A 08/28/2015   Procedure: LAPAROSCOPIC GASTRIC RESTRICTIVE DUODENAL PROCEDURE (DUODENAL SWITCH);  Surgeon: Ladora Daniel, MD;  Location: [facility] ORS;  Service: General;  Laterality: N/A;  . 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 Hypertension Mother   . Diabetes Mother   . Asthma Brother   . Breast cancer Neg Hx   .  Colon cancer Neg Hx     SOCIAL HX: reviewed.    Current Outpatient Medications:  .  albuterol (PROVENTIL HFA;VENTOLIN HFA) 108 (90 Base) MCG/ACT inhaler, Inhale 2 puffs into the lungs every 6 (six) hours as needed for wheezing or shortness of breath. Reported on 05/01/2016, Disp: 1 Inhaler, Rfl: 1 .  azelastine (OPTIVAR) 0.05 % ophthalmic solution, Place 1 drop into both eyes 2 (two) times daily as needed (for allergic eye). Reported on  05/01/2016, Disp: , Rfl:  .  clindamycin (CLEOCIN T) 1 % external solution, Apply 1 application topically 2 (two) times daily as needed (applied to breast, groin, and underarms as needed.)., Disp: , Rfl:  .  clindamycin (CLEOCIN) 300 MG capsule, Take 300 mg by mouth See admin instructions. Take 1 capsule (300 mg) daily for 2 weeks as needed HS, Disp: , Rfl:  .  diclofenac Sodium (VOLTAREN) 1 % GEL, Apply topically., Disp: , Rfl:  .  glucose blood (PRECISION QID TEST) test strip, Use 1 strip  twice a day  [dx 250.02], Disp: , Rfl:  .  ketoconazole (NIZORAL) 2 % shampoo, Apply 1 application topically 3 (three) times a week. Use 2-3 times a week. Leave in for 5 minutes before rinsing out, Disp: , Rfl:  .  lamoTRIgine (LAMICTAL) 100 MG tablet, , Disp: , Rfl:  .  levocetirizine (XYZAL) 5 MG tablet, Take 1 tablet (5 mg total) by mouth every evening. Reported on 05/01/2016, Disp: 30 tablet, Rfl: 1 .  medroxyPROGESTERone (DEPO-PROVERA) 150 MG/ML injection, Inject 1 mL (150 mg total) into the muscle every 3 (three) months., Disp: 1 mL, Rfl: 3 .  metFORMIN (GLUCOPHAGE-XR) 500 MG 24 hr tablet, TAKE 1 TABLET(500 MG) BY MOUTH TWICE DAILY WITH A MEAL, Disp: 180 tablet, Rfl: 3 .  montelukast (SINGULAIR) 10 MG tablet, Take 10 mg by mouth at bedtime., Disp: , Rfl:  .  ondansetron (ZOFRAN) 4 MG tablet, Take 1 tablet (4 mg total) by mouth every 6 (six) hours., Disp: 12 tablet, Rfl: 0 .  pantoprazole (PROTONIX) 40 MG tablet, Take 1 tablet (40 mg total) by mouth every morning., Disp: 90 tablet, Rfl: 1 .  rifampin (RIFADIN) 300 MG capsule, Take 300 mg by mouth See admin instructions. Take 1 capsule (300 mg) by mouth daily for 2 weeks for HS, Disp: , Rfl:  .  Semaglutide (RYBELSUS) 7 MG TABS, Take 7 mg by mouth daily., Disp: 90 tablet, Rfl: 2 .  sertraline (ZOLOFT) 100 MG tablet, Take 100 mg by mouth daily., Disp: , Rfl:  .  traZODone (DESYREL) 50 MG tablet, Take 1 tablet by mouth at bedtime., Disp: , Rfl:  .  triamcinolone  ointment (KENALOG) 0.1 %, Apply 1 application topically 2 (two) times daily as needed (applied to affected areas of scalp)., Disp: , Rfl:  .  verapamil (CALAN-SR) 120 MG CR tablet, Take 1 tablet (120 mg total) by mouth daily., Disp: 30 tablet, Rfl: 1 .  WIXELA INHUB 250-50 MCG/DOSE AEPB, INL 1 PUFF PO BID, Disp: , Rfl:  .  Olopatadine HCl 0.6 % SOLN, USE 1 SPRAY INTO THE NOSE DAILY AS NEEDED FOR ALLERGIES, Disp: 30.5 g, Rfl: 0  Current Facility-Administered Medications:  .  polyethylene glycol (MIRALAX / GLYCOLAX) packet 17 g, 17 g, Oral, Daily, Schuman, Christanna R, MD  EXAM:  GENERAL: alert, oriented, appears well and in no acute distress  HEENT: atraumatic, conjunttiva clear, no obvious abnormalities on inspection of external nose and ears  NECK: normal movements of the  head and neck  LUNGS: on inspection no signs of respiratory distress, breathing rate appears normal, no obvious gross SOB, gasping or wheezing  CV: no obvious cyanosis  PSYCH/NEURO: pleasant and cooperative, no obvious depression or anxiety, speech and thought processing grossly intact  ASSESSMENT AND PLAN:  Discussed the following assessment and plan:  Anemia Recent B12 wnl.  Recheck cbc and iron studies.  Not taking iron now.    Diabetes mellitus without complication (HCC) Low carb diet and exercise.  Sugars as outlined.  Check met b and a1c.   Generalized anxiety disorder Followed by psychiatry.  Has been doing relatively well on zoloft and imitrex.  Continue f/u with psychiatry.   GERD (gastroesophageal reflux disease) On protonix.  No upper symptoms.    Headache Headaches better.  Continue verapamil.  Seeing Dr Manuella Ghazi.    Hypertension On verapamil now.  Follow pressures.  Follow metabolic panel.   Major depressive disorder, recurrent episode, moderate (HCC) Continues on zoloft and lamictal.  Stable.  Continues f/u with psychiatry.   Vitamin D deficiency Follow vitamin D level.     Orders  Placed This Encounter  Procedures  . Hemoglobin A1c  . Hepatic function panel  . Lipid panel  . CBC with Differential/Platelet  . Basic metabolic panel  . Iron and TIBC  . Ferritin    Meds ordered this encounter  Medications  . pantoprazole (PROTONIX) 40 MG tablet    Sig: Take 1 tablet (40 mg total) by mouth every morning.    Dispense:  90 tablet    Refill:  1     I discussed the assessment and treatment plan with the patient. The patient was provided an opportunity to ask questions and all were answered. The patient agreed with the plan and demonstrated an understanding of the instructions.   The patient was advised to call back or seek an in-person evaluation if the symptoms worsen or if the condition fails to improve as anticipated.    Einar Pheasant, MD

## 2020-02-01 ENCOUNTER — Encounter: Payer: Self-pay | Admitting: Internal Medicine

## 2020-02-02 ENCOUNTER — Other Ambulatory Visit: Payer: Self-pay | Admitting: Internal Medicine

## 2020-02-05 ENCOUNTER — Encounter: Payer: Self-pay | Admitting: Internal Medicine

## 2020-02-05 NOTE — Assessment & Plan Note (Signed)
Followed by psychiatry.  Has been doing relatively well on zoloft and imitrex.  Continue f/u with psychiatry.

## 2020-02-05 NOTE — Assessment & Plan Note (Signed)
Recent B12 wnl.  Recheck cbc and iron studies.  Not taking iron now.

## 2020-02-05 NOTE — Assessment & Plan Note (Signed)
Low carb diet and exercise.  Sugars as outlined.  Check met b and a1c.   

## 2020-02-05 NOTE — Assessment & Plan Note (Signed)
Headaches better.  Continue verapamil.  Seeing Dr Sherryll Burger.

## 2020-02-05 NOTE — Assessment & Plan Note (Signed)
On verapamil now.  Follow pressures.  Follow metabolic panel.

## 2020-02-05 NOTE — Assessment & Plan Note (Signed)
Follow vitamin D level.  

## 2020-02-05 NOTE — Assessment & Plan Note (Signed)
On protonix.  No upper symptoms.   

## 2020-02-05 NOTE — Assessment & Plan Note (Signed)
Continues on zoloft and lamictal.  Stable.  Continues f/u with psychiatry.

## 2020-02-06 ENCOUNTER — Encounter: Payer: Self-pay | Admitting: Oncology

## 2020-02-06 ENCOUNTER — Inpatient Hospital Stay: Payer: Managed Care, Other (non HMO) | Attending: Oncology | Admitting: Oncology

## 2020-02-06 ENCOUNTER — Encounter: Payer: Self-pay | Admitting: Gastroenterology

## 2020-02-06 DIAGNOSIS — D509 Iron deficiency anemia, unspecified: Secondary | ICD-10-CM

## 2020-02-06 DIAGNOSIS — K449 Diaphragmatic hernia without obstruction or gangrene: Secondary | ICD-10-CM

## 2020-02-06 DIAGNOSIS — K648 Other hemorrhoids: Secondary | ICD-10-CM

## 2020-02-06 NOTE — Progress Notes (Signed)
Fatigue, she takes trazadone but wakes up at night. Gets 6 hours sleep. No blood in urine or stool

## 2020-02-07 ENCOUNTER — Ambulatory Visit (INDEPENDENT_AMBULATORY_CARE_PROVIDER_SITE_OTHER): Payer: Managed Care, Other (non HMO) | Admitting: Gastroenterology

## 2020-02-07 ENCOUNTER — Encounter: Payer: Self-pay | Admitting: Gastroenterology

## 2020-02-07 DIAGNOSIS — D509 Iron deficiency anemia, unspecified: Secondary | ICD-10-CM

## 2020-02-07 NOTE — Progress Notes (Signed)
Shirley Bouillon, MD [address]  [address]  [place], [place] [address]  Main: [phone]  Fax: [phone]   Primary Care Physician: Shirley Fawn [place], MD  Virtual Visit via Video Note  I connected with patient on 02/07/20 at 10:30 AM EDT by video (using [email or web address]) and verified that I am speaking with the correct person using two identifiers.   I discussed the limitations, risks, security and privacy concerns of performing an evaluation and management service by video and the availability of in person appointments. I also discussed with the patient that there may be a patient responsible charge related to this service. The patient expressed understanding and agreed to proceed.  Location of Patient: Home Location of Provider: Home Persons involved: Patient and provider only (Nursing staff checked in patient via phone but were not physically involved in the video interaction - see their notes)   History of Present Illness: Chief Complaint  Patient presents with  . Anemia    Denies any fatigue     HPI: Shirley [name] is a 39 y.o. female here for follow-up of iron deficiency anemia.  Patient underwent EGD and colonoscopy with 1 benign polyp on the colonoscopy, small hiatal hernia on the EGD and evidence of duodenal switch.  Otherwise normal.  Small bowel capsule study is scheduled for later this week.  The patient denies abdominal or flank pain, anorexia, nausea or vomiting, dysphagia, change in bowel habits or black or bloody stools or weight loss.  Saw [name] hematology and is scheduled for iron infusion as well.  Dr. Assunta Gambles note not available from her visit yesterday yet.  Previous history: Most recent ferritin is low normal at 20.  Low MCV.  Most recent iron saturation was less than 45%.  Had a sleeve gastrectomy in 2016.  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  Had an EGD in March 2019 by  bariatric surgeon, [name], at [facility] for upper abdominal pain.  This reported changes consistent with sleeve gastrectomy, sliding hiatal hernia, 2 cm.  Preserved antrum following prior sleeve gastrectomy.  Sleeve itself was noted to be consistently tubular in diameter and slightly dilated relative to original shape, "especially above the nonrestrictive incisura."  Absence of esophagitis was also reported.  Barium swallow in February 2019 reported mild to moderate dilation of esophagus with minimal intraesophageal reflux, markedly dilated sleep specially above incisura with large air bubble and air-fluid level.  Moderate dilation of antrum.  Had an EGD and colonoscopy in 2010 with [name] for iron deficiency anemia and heme positive stools.  EGD showed few gastric polyps, and was otherwise normal.  Colonoscopy reported diverticulosis and was otherwise normal.  CT abdomen pelvis in 2014 reported hepatic steatosis.  Patient is aware of the fatty liver diagnosis and was told to lose weight prior to her sleeve gastrectomy.  Most recent liver enzymes are normal.  Reports she also had hepatitis a and B vaccine series at [facility] already.  Current Outpatient Medications  Medication Sig Dispense Refill  . albuterol (PROVENTIL HFA;VENTOLIN HFA) 108 (90 Base) MCG/ACT inhaler Inhale 2 puffs into the lungs every 6 (six) hours as needed for wheezing or shortness of breath. Reported on 05/01/2016 1 Inhaler 1  . azelastine (OPTIVAR) 0.05 % ophthalmic solution Place 1 drop into both eyes 2 (two) times daily as needed (for allergic eye). Reported on 05/01/2016    . clindamycin (CLEOCIN T) 1 % external solution  Apply 1 application topically 2 (two) times daily as needed (applied to breast, groin, and underarms as needed.).    Marland Kitchen clindamycin (CLEOCIN) 300 MG capsule Take 300 mg by mouth See admin instructions. Take 1 capsule (300 mg) daily for 2 weeks as needed HS    . diclofenac Sodium (VOLTAREN) 1 % GEL Apply 2  g topically at bedtime.     Marland Kitchen glucose blood (PRECISION QID TEST) test strip Use 1 strip  twice a day  [dx 250.02]    . ketoconazole (NIZORAL) 2 % shampoo Apply 1 application topically 3 (three) times a week. Use 2-3 times a week. Leave in for 5 minutes before rinsing out    . lamoTRIgine (LAMICTAL) 100 MG tablet 100 mg 2 (two) times daily.     Marland Kitchen levocetirizine (XYZAL) 5 MG tablet Take 1 tablet (5 mg total) by mouth every evening. Reported on 05/01/2016 30 tablet 1  . medroxyPROGESTERone (DEPO-PROVERA) 150 MG/ML injection Inject 1 mL (150 mg total) into the muscle every 3 (three) months. 1 mL 3  . metFORMIN (GLUCOPHAGE-XR) 500 MG 24 hr tablet TAKE 1 TABLET(500 MG) BY MOUTH TWICE DAILY WITH A MEAL 180 tablet 3  . montelukast (SINGULAIR) 10 MG tablet Take 10 mg by mouth at bedtime.    . Olopatadine HCl 0.6 % SOLN USE 1 SPRAY INTO THE NOSE DAILY AS NEEDED FOR ALLERGIES 30.5 g 0  . ondansetron (ZOFRAN) 4 MG tablet Take 1 tablet (4 mg total) by mouth every 6 (six) hours. 12 tablet 0  . pantoprazole (PROTONIX) 40 MG tablet Take 1 tablet (40 mg total) by mouth every morning. 90 tablet 1  . rifampin (RIFADIN) 300 MG capsule Take 300 mg by mouth See admin instructions. Take 1 capsule (300 mg) by mouth daily for 2 weeks for HS    . Semaglutide (RYBELSUS) 7 MG TABS Take 7 mg by mouth daily. 90 tablet 2  . sertraline (ZOLOFT) 100 MG tablet Take 100 mg by mouth daily.    . traZODone (DESYREL) 50 MG tablet Take 1 tablet by mouth at bedtime.    . triamcinolone ointment (KENALOG) 0.1 % Apply 1 application topically 2 (two) times daily as needed (applied to affected areas of scalp).    . verapamil (CALAN-SR) 120 MG CR tablet Take 1 tablet (120 mg total) by mouth daily. 30 tablet 1  . WIXELA INHUB 250-50 MCG/DOSE AEPB INL 1 PUFF PO BID     Current Facility-Administered Medications  Medication Dose Route Frequency Provider Last Rate Last Admin  . polyethylene glycol (MIRALAX / GLYCOLAX) packet 17 g  17 g Oral Daily  Schuman, Christanna R, MD        Allergies as of 02/07/2020 - Review Complete 02/07/2020  Allergen Reaction Noted  . Ibuprofen Other (See Comments) 09/06/2012  . Caffeine Other (See Comments) 10/07/2012    Review of Systems:    All systems reviewed and negative except where noted in HPI.   Observations/Objective:  Labs: CMP     Component Value Date/Time   NA 137 01/04/2020 1026   NA 139 08/26/2012 1549   K 4.3 01/04/2020 1026   K 3.8 08/26/2012 1549   CL 102 01/04/2020 1026   CL 104 08/26/2012 1549   CO2 22 01/04/2020 1026   CO2 26 08/26/2012 1549   GLUCOSE 91 01/04/2020 1026   GLUCOSE 167 (H) 12/29/2019 2208   GLUCOSE 149 (H) 08/26/2012 1549   BUN 9 01/04/2020 1026   BUN 14 08/26/2012 1549  CREATININE 0.78 01/04/2020 1026   CREATININE 1.10 08/26/2012 1549   CALCIUM 9.0 01/04/2020 1026   CALCIUM 9.4 08/26/2012 1549   PROT 6.9 01/04/2020 1026   PROT 8.1 08/26/2012 1549   ALBUMIN 4.0 01/04/2020 1026   ALBUMIN 3.9 08/26/2012 1549   AST 17 01/04/2020 1026   AST 24 08/26/2012 1549   ALT 10 01/04/2020 1026   ALT 33 08/26/2012 1549   ALKPHOS 108 01/04/2020 1026   ALKPHOS 124 08/26/2012 1549   BILITOT 0.5 01/04/2020 1026   BILITOT 0.4 08/26/2012 1549   GFRNONAA 95 01/04/2020 1026   GFRNONAA >60 08/26/2012 1549   GFRAA 110 01/04/2020 1026   GFRAA >60 08/26/2012 1549   Lab Results  Component Value Date   WBC 7.0 01/04/2020   HGB 11.3 01/04/2020   HCT 37.4 01/04/2020   MCV 75 (L) 01/04/2020   PLT 262 01/04/2020    Imaging Studies: No results found.  Assessment and Plan:   BERKLIE DETHLEFS is a 39 y.o. y/o female following up for iron deficiency anemia  Assessment and Plan: Iron deficiency anemia could possibly be due to her duodenal switch anatomy.    However, small bowel capsule study is indicated and has been scheduled to rule out any other underlying etiology to complete iron deficiency anemia work-up  Continue follow-up with hematology for iron  replacement  In addition the above, the risks of small bowel capsule getting stuck in the GI tract were discussed. Need for surgery for removal of the capsule, if this occurs were discussed as well. This usually occurs if a stricture, mass or abnormality is present in the small bowel. Pt verbalized understanding and is agreeable to proceed with small bowel capsule study.    Follow Up Instructions:    I discussed the assessment and treatment plan with the patient. The patient was provided an opportunity to ask questions and all were answered. The patient agreed with the plan and demonstrated an understanding of the instructions.   The patient was advised to call back or seek an in-person evaluation if the symptoms worsen or if the condition fails to improve as anticipated.  I provided 15 minutes of face-to-face time via video software during this encounter. Additional time was spent in reviewing patient's chart, placing orders etc.   Virgel Manifold, MD  Speech recognition software was used to dictate this note.

## 2020-02-08 ENCOUNTER — Encounter: Payer: Self-pay | Admitting: Oncology

## 2020-02-08 ENCOUNTER — Inpatient Hospital Stay: Payer: Managed Care, Other (non HMO)

## 2020-02-08 NOTE — Progress Notes (Signed)
Hematology/Oncology Consult note [facility] [phone] [phone]  Patient Care Team: Dale [place], MD as PCP - General (Internal Medicine)   Name of the patient: Shirley [name]  [id number]  1981/10/29    Reason for referral- iron deficiency anemia   Referring physician- Dr. Maximino Greenland  Date of visit: 02/08/20   History of presenting illness- Patient is a 39 year old African-American female who was seen by Dr. Maximino Greenland for iron deficiency anemia.  She has undergone sleeve gastrectomy in 2016 and also had a duodenal switch with repair of hiatal hernia without fundoplication in October 2016.  Patient recently underwent upper endoscopy and colonoscopy on 01/23/2020 which showed a small hiatal hernia and a 2 mm polyp in the descending colon as well as diverticulosis and nonbleeding internal hemorrhoids.  Patient denies any heavy menstrual bleeding.  She denies any consistent use of NSAIDs.  No family history of colon cancer  Patient's most recent blood work from 01/04/2020 showed a low ferritin of 20.  Iron studies normal iron saturation 36%.  B12 levels were normal at 666.  CBC showed white cell count of 7, H&H 11.3/37.4 with an MCV of 75 and a platelet count of 262.  ECOG PS- 0  Pain scale- 0   Review of systems- Review of Systems  Constitutional: Positive for malaise/fatigue. Negative for chills, fever and weight loss.  HENT: Negative for congestion, ear discharge and nosebleeds.   Eyes: Negative for blurred vision.  Respiratory: Negative for cough, hemoptysis, sputum production, shortness of breath and wheezing.   Cardiovascular: Negative for chest pain, palpitations, orthopnea and claudication.  Gastrointestinal: Negative for abdominal pain, blood in stool, constipation, diarrhea, heartburn, melena, nausea and vomiting.  Genitourinary: Negative for dysuria, flank pain, frequency, hematuria and urgency.  Musculoskeletal: Negative for  back pain, joint pain and myalgias.  Skin: Negative for rash.  Neurological: Negative for dizziness, tingling, focal weakness, seizures, weakness and headaches.  Endo/Heme/Allergies: Does not bruise/bleed easily.  Psychiatric/Behavioral: Negative for depression and suicidal ideas. The patient does not have insomnia.     Allergies  Allergen Reactions  . Ibuprofen Other (See Comments)    Upset stomach   . Caffeine Other (See Comments)    Stomach issues    Patient Active Problem List   Diagnosis Date Noted  . Iron deficiency anemia 02/06/2020  . Polyp of descending colon   . Anemia 02/13/2019  . Herpes zoster 01/16/2019  . Diabetes mellitus without complication (HCC) 10/17/2018  . Bloating 08/07/2018  . BMI 45.0-49.9, adult (HCC) 06/09/2018  . Epigastric pain 06/09/2018  . Status post hysteroscopy 03/30/2018  . Asthma exacerbation 09/16/2017  . Tooth abscess 05/29/2017  . Cough 01/31/2017  . Right shoulder pain 11/11/2016  . Headache 11/11/2016  . Sinusitis 08/31/2016  . Generalized anxiety disorder 07/04/2016  . Major depressive disorder, recurrent episode, moderate (HCC) 07/04/2016  . Dizziness 06/01/2016  . Fatigue 05/01/2016  . Sleep difficulties 02/12/2016  . Bariatric surgery status 08/28/2015  . Severe obesity (BMI >= 40) (HCC) 09/04/2014  . Stress 02/14/2014  . SOB (shortness of breath) 09/15/2013  . Environmental allergies 09/15/2013  . Microalbuminuria 07/27/2013  . Vitamin D deficiency 10/09/2012  . Hypertension 09/06/2012  . GERD (gastroesophageal reflux disease) 09/06/2012     Past Medical History:  Diagnosis Date  . Anemia   . Asthma    WELL CONTROLLED  . Complication of anesthesia    -TACYCARDIA AFTER GASTRIC SURGERY IN 2016 AND HAD TO STAY 5 DAYS-itching after surgery 2005, 2006  .  Depression   . Diabetes mellitus (HCC)   . Fatty liver   . GERD (gastroesophageal reflux disease)   . Heart murmur   . History of dermoid cyst excision   .  History of hiatal hernia   . Hypertension    H/O  . IBS (irritable bowel syndrome)   . Migraine   . PONV (postoperative nausea and vomiting)    nausea     Past Surgical History:  Procedure Laterality Date  . CHOLECYSTECTOMY  2005  . COLONOSCOPY WITH PROPOFOL N/A 01/23/2020   Procedure: COLONOSCOPY WITH PROPOFOL;  Surgeon: Midge Minium, MD;  Location: [facility] ENDOSCOPY;  Service: Endoscopy;  Laterality: N/A;  . DILATATION & CURETTAGE/HYSTEROSCOPY WITH MYOSURE N/A 03/30/2018   Procedure: DILATATION & CURETTAGE/HYSTEROSCOPY;  Surgeon: Natale Milch, MD;  Location: [facility] ORS;  Service: Gynecology;  Laterality: N/A;  . ESOPHAGOGASTRODUODENOSCOPY (EGD) WITH PROPOFOL N/A 01/23/2020   Procedure: ESOPHAGOGASTRODUODENOSCOPY (EGD) WITH PROPOFOL;  Surgeon: Midge Minium, MD;  Location: [facility] ENDOSCOPY;  Service: Endoscopy;  Laterality: N/A;  . LAPAROSCOPIC GASTRIC RESTRICTIVE DUODENAL PROCEDURE (DUODENAL SWITCH) N/A 08/28/2015   Procedure: LAPAROSCOPIC GASTRIC RESTRICTIVE DUODENAL PROCEDURE (DUODENAL SWITCH);  Surgeon: [name], MD;  Location: [facility] ORS;  Service: General;  Laterality: N/A;  . 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 Marital status: Single    Spouse name: Not on file  . Number of children: 0  . Years of education: Not on file  . Highest education level: Not on file  Occupational History    Employer: [employer]  Tobacco Use  . Smoking status: Never Smoker  . Smokeless tobacco: Never Used  Substance and Sexual Activity  . Alcohol use: Not Currently    Alcohol/week: 0.0 standard drinks  . Drug use: No  . Sexual activity: Not Currently    Birth control/protection: Other-see comments    Comment: depoprovera every 3 months  Other Topics Concern  . Not on file  Social History Narrative  . Not on file   Social Determinants of Health   Financial Resource Strain:   . Difficulty of Paying Living Expenses:   Food Insecurity:   .  Worried About Programme researcher, broadcasting/film/video in the Last Year:   . Barista in the Last Year:   Transportation Needs:   . Freight forwarder (Medical):   Marland Kitchen Lack of Transportation (Non-Medical):   Physical Activity:   . Days of Exercise per Week:   . Minutes of Exercise per Session:   Stress:   . Feeling of Stress :   Social Connections:   . Frequency of Communication with Friends and Family:   . Frequency of Social Gatherings with Friends and Family:   . Attends Religious Services:   . Active Member of Clubs or Organizations:   . Attends Banker Meetings:   Marland Kitchen Marital Status:   Intimate Partner Violence:   . Fear of Current or Ex-Partner:   . Emotionally Abused:   Marland Kitchen Physically Abused:   . Sexually Abused:      Family History  Problem Relation Age of Onset  . Hypertension Mother   . Diabetes Mother   . Asthma Brother   . Breast cancer Neg Hx   . Colon cancer Neg Hx      Current Outpatient Medications:  .  albuterol (PROVENTIL HFA;VENTOLIN HFA) 108 (90 Base) MCG/ACT inhaler, Inhale 2 puffs into the lungs every 6 (six) hours as  needed for wheezing or shortness of breath. Reported on 05/01/2016, Disp: 1 Inhaler, Rfl: 1 .  azelastine (OPTIVAR) 0.05 % ophthalmic solution, Place 1 drop into both eyes 2 (two) times daily as needed (for allergic eye). Reported on 05/01/2016, Disp: , Rfl:  .  clindamycin (CLEOCIN T) 1 % external solution, Apply 1 application topically 2 (two) times daily as needed (applied to breast, groin, and underarms as needed.)., Disp: , Rfl:  .  clindamycin (CLEOCIN) 300 MG capsule, Take 300 mg by mouth See admin instructions. Take 1 capsule (300 mg) daily for 2 weeks as needed HS, Disp: , Rfl:  .  diclofenac Sodium (VOLTAREN) 1 % GEL, Apply 2 g topically at bedtime. , Disp: , Rfl:  .  glucose blood (PRECISION QID TEST) test strip, Use 1 strip  twice a day  [dx 250.02], Disp: , Rfl:  .  ketoconazole (NIZORAL) 2 % shampoo, Apply 1 application topically 3  (three) times a week. Use 2-3 times a week. Leave in for 5 minutes before rinsing out, Disp: , Rfl:  .  lamoTRIgine (LAMICTAL) 100 MG tablet, 100 mg 2 (two) times daily. , Disp: , Rfl:  .  levocetirizine (XYZAL) 5 MG tablet, Take 1 tablet (5 mg total) by mouth every evening. Reported on 05/01/2016, Disp: 30 tablet, Rfl: 1 .  medroxyPROGESTERone (DEPO-PROVERA) 150 MG/ML injection, Inject 1 mL (150 mg total) into the muscle every 3 (three) months., Disp: 1 mL, Rfl: 3 .  metFORMIN (GLUCOPHAGE-XR) 500 MG 24 hr tablet, TAKE 1 TABLET(500 MG) BY MOUTH TWICE DAILY WITH A MEAL, Disp: 180 tablet, Rfl: 3 .  montelukast (SINGULAIR) 10 MG tablet, Take 10 mg by mouth at bedtime., Disp: , Rfl:  .  Olopatadine HCl 0.6 % SOLN, USE 1 SPRAY INTO THE NOSE DAILY AS NEEDED FOR ALLERGIES, Disp: 30.5 g, Rfl: 0 .  ondansetron (ZOFRAN) 4 MG tablet, Take 1 tablet (4 mg total) by mouth every 6 (six) hours., Disp: 12 tablet, Rfl: 0 .  pantoprazole (PROTONIX) 40 MG tablet, Take 1 tablet (40 mg total) by mouth every morning., Disp: 90 tablet, Rfl: 1 .  Semaglutide (RYBELSUS) 7 MG TABS, Take 7 mg by mouth daily., Disp: 90 tablet, Rfl: 2 .  sertraline (ZOLOFT) 100 MG tablet, Take 100 mg by mouth daily., Disp: , Rfl:  .  traZODone (DESYREL) 50 MG tablet, Take 1 tablet by mouth at bedtime., Disp: , Rfl:  .  triamcinolone ointment (KENALOG) 0.1 %, Apply 1 application topically 2 (two) times daily as needed (applied to affected areas of scalp)., Disp: , Rfl:  .  verapamil (CALAN-SR) 120 MG CR tablet, Take 1 tablet (120 mg total) by mouth daily., Disp: 30 tablet, Rfl: 1 .  WIXELA INHUB 250-50 MCG/DOSE AEPB, INL 1 PUFF PO BID, Disp: , Rfl:  .  rifampin (RIFADIN) 300 MG capsule, Take 300 mg by mouth See admin instructions. Take 1 capsule (300 mg) by mouth daily for 2 weeks for HS, Disp: , Rfl:   Current Facility-Administered Medications:  .  polyethylene glycol (MIRALAX / GLYCOLAX) packet 17 g, 17 g, Oral, Daily, Schuman, Christanna R,  MD   Physical exam:  Vitals:   02/06/20 1349  BP: (!) 142/96  Pulse: 81  Resp: 16  Temp: 98 F (36.7 C)  TempSrc: Tympanic  Weight: 221 lb (100.2 kg)   Physical Exam HENT:     Head: Normocephalic and atraumatic.  Eyes:     Pupils: Pupils are equal, round, and reactive to light.  Cardiovascular:     Rate and Rhythm: Normal rate and regular rhythm.     Heart sounds: Normal heart sounds.  Pulmonary:     Effort: Pulmonary effort is normal.     Breath sounds: Normal breath sounds.  Abdominal:     General: Bowel sounds are normal.     Palpations: Abdomen is soft.  Musculoskeletal:     Cervical back: Normal range of motion.  Skin:    General: Skin is warm and dry.  Neurological:     Mental Status: She is alert and oriented to person, place, and time.        CMP Latest Ref Rng & Units 01/04/2020  Glucose 65 - 99 mg/dL 91  BUN 6 - 24 mg/dL 9  Creatinine 2.03 - 5.59 mg/dL 7.41  Sodium 638 - 453 mmol/L 137  Potassium 3.5 - 5.2 mmol/L 4.3  Chloride 96 - 106 mmol/L 102  CO2 20 - 29 mmol/L 22  Calcium 8.7 - 10.2 mg/dL 9.0  Total Protein 6.0 - 8.5 g/dL 6.9  Total Bilirubin 0.0 - 1.2 mg/dL 0.5  Alkaline Phos 39 - 117 IU/L 108  AST 0 - 40 IU/L 17  ALT 0 - 32 IU/L 10   CBC Latest Ref Rng & Units 01/04/2020  WBC 3.4 - 10.8 x10E3/uL 7.0  Hemoglobin 11.1 - 15.9 g/dL 64.6  Hematocrit 80.3 - 46.6 % 37.4  Platelets 150 - 450 x10E3/uL 262     Assessment and plan- Patient is a 39 y.o. female Referred for iron deficiency anemia  Based on patient's blood work from February 2021 patient was noted to have a low ferritin of 20.  She is mildly anemic with and microcytosis with an MCV of 75.  Given her history of gastric sleeve surgery and a low ferritin it would be reasonable to proceed with IV iron at this time.  I will plan to give her Feraheme 510 mg IV x2 weekly.  Discussed risks and benefits of Feraheme including all but not limited to nausea, headache, leg swelling and possible  risk of infusion reaction.  Patient understands and agrees to proceed as planned.  I will repeat CBC ferritin and iron studies as well as hemoglobin electrophoresis and copper levels in 2 months   Thank you for this kind referral and the opportunity to participate in the care of this patient   Visit Diagnosis 1. Iron deficiency anemia, unspecified iron deficiency anemia type     Dr. Owens Shark, MD, MPH [facility] at [facility] [phone] 02/08/2020 1:19 PM

## 2020-02-09 ENCOUNTER — Ambulatory Visit: Payer: Managed Care, Other (non HMO) | Attending: Internal Medicine

## 2020-02-09 ENCOUNTER — Ambulatory Visit
Admission: RE | Admit: 2020-02-09 | Discharge: 2020-02-09 | Disposition: A | Discharge status: Home / Self Care | Payer: Managed Care, Other (non HMO) | Attending: Gastroenterology | Admitting: Gastroenterology

## 2020-02-09 ENCOUNTER — Encounter
Admission: RE | Disposition: A | Discharge status: Home / Self Care | Payer: Self-pay | Source: Home / Self Care | Attending: Gastroenterology

## 2020-02-09 ENCOUNTER — Other Ambulatory Visit: Payer: Self-pay

## 2020-02-09 DIAGNOSIS — D509 Iron deficiency anemia, unspecified: Secondary | ICD-10-CM | POA: Diagnosis not present

## 2020-02-09 DIAGNOSIS — Z23 Encounter for immunization: Secondary | ICD-10-CM

## 2020-02-09 HISTORY — PX: GIVENS CAPSULE STUDY: SHX5432

## 2020-02-09 SURGERY — IMAGING PROCEDURE, GI TRACT, INTRALUMINAL, VIA CAPSULE

## 2020-02-09 NOTE — Progress Notes (Signed)
   Covid-19 Vaccination Clinic  Name:  Shirley [name]    MRN: [id number] DOB: 1980-12-14  02/09/2020  [name] was observed post Covid-19 immunization for 15 minutes without incident. She was provided with Vaccine Information Sheet and instruction to access the V-Safe system.   [name] was instructed to call 911 with any severe reactions post vaccine: Marland Kitchen Difficulty breathing  . Swelling of face and throat  . A fast heartbeat  . A bad rash all over body  . Dizziness and weakness   Immunizations Administered    Name Date Dose VIS Date Route   Pfizer COVID-19 Vaccine 02/09/2020  9:23 AM 0.3 mL 11/04/2019 Intramuscular   Manufacturer: [employer], [employer]   Lot: [id number]   NDC: [address]-3

## 2020-02-10 ENCOUNTER — Encounter: Payer: Self-pay | Admitting: *Deleted

## 2020-02-14 ENCOUNTER — Inpatient Hospital Stay: Payer: Managed Care, Other (non HMO)

## 2020-02-14 VITALS — BP 128/77 | HR 88 | Temp 98.3°F | Resp 18

## 2020-02-14 DIAGNOSIS — D509 Iron deficiency anemia, unspecified: Secondary | ICD-10-CM | POA: Diagnosis not present

## 2020-02-14 MED ORDER — SODIUM CHLORIDE 0.9 % IV SOLN
Freq: Once | INTRAVENOUS | Status: AC
  Filled 2020-02-14: qty 250

## 2020-02-14 MED ORDER — SODIUM CHLORIDE 0.9 % IV SOLN
510.0000 mg | Freq: Once | INTRAVENOUS | Status: AC
  Administered 2020-02-14: 510 mg via INTRAVENOUS
  Filled 2020-02-14: qty 510

## 2020-02-14 NOTE — Progress Notes (Signed)
Pt tolerated infusion well. Pt and VS stable at discharge. No s/s of reaction or distress noted.

## 2020-02-19 ENCOUNTER — Encounter: Payer: Self-pay | Admitting: Internal Medicine

## 2020-02-22 ENCOUNTER — Inpatient Hospital Stay: Payer: Managed Care, Other (non HMO)

## 2020-02-22 ENCOUNTER — Other Ambulatory Visit: Payer: Self-pay

## 2020-02-22 ENCOUNTER — Encounter: Payer: Self-pay | Admitting: Internal Medicine

## 2020-02-22 ENCOUNTER — Ambulatory Visit (INDEPENDENT_AMBULATORY_CARE_PROVIDER_SITE_OTHER): Payer: Managed Care, Other (non HMO) | Admitting: Internal Medicine

## 2020-02-22 VITALS — BP 110/75 | HR 97 | Temp 97.8°F | Resp 18

## 2020-02-22 DIAGNOSIS — E119 Type 2 diabetes mellitus without complications: Secondary | ICD-10-CM

## 2020-02-22 DIAGNOSIS — D509 Iron deficiency anemia, unspecified: Secondary | ICD-10-CM

## 2020-02-22 DIAGNOSIS — R809 Proteinuria, unspecified: Secondary | ICD-10-CM

## 2020-02-22 MED ORDER — METFORMIN HCL ER 500 MG PO TB24: ORAL_TABLET | ORAL | 3 refills | Status: DC

## 2020-02-22 MED ORDER — SODIUM CHLORIDE 0.9 % IV SOLN
Freq: Once | INTRAVENOUS | Status: AC
  Filled 2020-02-22: qty 250

## 2020-02-22 MED ORDER — SODIUM CHLORIDE 0.9 % IV SOLN
510.0000 mg | Freq: Once | INTRAVENOUS | Status: AC
  Administered 2020-02-22: 510 mg via INTRAVENOUS
  Filled 2020-02-22: qty 510

## 2020-02-22 NOTE — Progress Notes (Signed)
Patient ID: Shirley [name], female   DOB: 1981-04-06, 39 y.o.   MRN: [id number]  Patient location: Work My location: Office Persons participating in the virtual visit: patient, provider  Referring Provider: Einar Pheasant, MD  I connected with the patient on 02/22/20 at 10:02 AM EDT by a video enabled telemedicine application and verified that I am speaking with the correct person.   I discussed the limitations of evaluation and management by telemedicine and the availability of in person appointments. The patient expressed understanding and agreed to proceed.   Details of the encounter are shown below.  HPI: Shirley [name] is a 39 y.o.-year-old female, presenting for f/u for DM2, dx 2005, non-insulin-dependent, uncontrolled, with complications (MAU). Last visit 7 months ago.  Reviewed HbA1c levels: Lab Results  Component Value Date   HGBA1C 5.9 (H) 01/04/2020   HGBA1C 6.7 (H) 07/04/2019   HGBA1C 6.5 (H) 02/03/2019  07/15/2015: 7.1% 07/13/2014: 7.2%  - at work 01/2014: 7.8% 08/2013: 7.2%  She is now on: - Metformin ER 500 mg 2x a day - still forgets evening dose, but less frequently - Rybelsus 7 mg before breakfast-started 10/2019  Previously tried: - Farxiga 5 mg daily-tried 03/2019 >> stopped because of urinary frequency, dizziness, dehydration - Januvia 100 mg in am - Metformin XR (osm) 1000 mg tabs 2x a day with meals >> some loose stools - Invokana 100 -tried 03/2014 She was on Janumet XR 1000-100 mg po with dinner (used to cut them in half to swallow them better!!!) She was on Metformin in the past >> diarrhea at max dose  She checks her sugars 0 to 1x a day - am:  119, 135, 143 >> 96-122 >> 122-146 >> 98-115 - 2h after b'fast:  149 >> n/c >> 176, 213 >> n/c - before lunch: 1 109 >> 114-154 >> n/c >> 76, 94-111 - 2h after lunch: 113-135, 176, 206 >> 176 >> n/c - before dinner: 131-162 >> 100-136 >> n/c >> 91, 122-127, 170 - 2h after dinner: 137-176, 228-257  (steroids) >> 154 >> 111, 114 - bedtime: n/c >> 132 >> n/c - nighttime: n/c >> 168 >> n/c Lowest sugar was  98 >> 122 >> 76; it is unclear at which level she has hypoglycemia awareness. Highest sugar was 257 >> 213 >> 170.  -No CKD, last BUN/creatinine: Lab Results  Component Value Date   BUN 9 01/04/2020   CREATININE 0.78 01/04/2020  She is off losartan.  Normal ACR: Lab Results  Component Value Date   MICRALBCREAT 7 01/04/2020   MICRALBCREAT 6.4 06/15/2018   MICRALBCREAT 11.2 07/31/2016   MICRALBCREAT 12.1 08/10/2015   -No HL: Lab Results  Component Value Date   CHOL 147 01/04/2020   HDL 64 01/04/2020   LDLCALC 65 01/04/2020   TRIG 95 01/04/2020   CHOLHDL 2.3 01/04/2020  She is not on a statin. - last eye exam was in 11/2019: No DR -No numbness and tingling in her feet.  She also has a history of vitamin D deficiency (on vitamin D 5000 units daily), HTN, GERD.   She has a history of gastric bypass (sleeve gastrectomy) in 08/2015. She did have complications after the surgery: Hypoglycemia and hypotension, both resolved.  Her diabetes dramatically improved after the surgery (she also lost ~50-60 pounds) and she could come off all of the medicines.  Since then, sugars worsened and we restarted metformin ER in 02/2018. In the past, she had GERD and abdominal pain.  She saw her  bariatric surgeon and the barium swallow showed abnormal stomach architecture.  She was told she may need a revision surgery.  However, she was started on acid reflux medicines and her symptoms improved significantly.  ROS: Constitutional: no weight gain/+ weight loss, no fatigue, no subjective hyperthermia, no subjective hypothermia Eyes: no blurry vision, no xerophthalmia ENT: no sore throat, no nodules palpated in neck, no dysphagia, no odynophagia, no hoarseness Cardiovascular: no CP/no SOB/no palpitations/no leg swelling Respiratory: no cough/no SOB/no wheezing Gastrointestinal: no N/no V/no D/no  C/no acid reflux Musculoskeletal: no muscle aches/no joint aches Skin: no rashes, no hair loss Neurological: no tremors/no numbness/no tingling/no dizziness  I reviewed pt's medications, allergies, PMH, social hx, family hx, and changes were documented in the history of present illness. Otherwise, unchanged from my initial visit note.  Past Medical History:  Diagnosis Date  . Anemia   . Asthma    WELL CONTROLLED  . Complication of anesthesia    -TACYCARDIA AFTER GASTRIC SURGERY IN 2016 AND HAD TO STAY 5 DAYS-itching after surgery 2005, 2006  . Depression   . Diabetes mellitus (HCC)   . Fatty liver   . GERD (gastroesophageal reflux disease)   . Heart murmur   . History of dermoid cyst excision   . History of hiatal hernia   . Hypertension    H/O  . IBS (irritable bowel syndrome)   . Migraine   . PONV (postoperative nausea and vomiting)    nausea   Past Surgical History:  Procedure Laterality Date  . CHOLECYSTECTOMY  2005  . COLONOSCOPY WITH PROPOFOL N/A 01/23/2020   Procedure: COLONOSCOPY WITH PROPOFOL;  Surgeon: Midge Minium, MD;  Location: [facility] ENDOSCOPY;  Service: Endoscopy;  Laterality: N/A;  . DILATATION & CURETTAGE/HYSTEROSCOPY WITH MYOSURE N/A 03/30/2018   Procedure: DILATATION & CURETTAGE/HYSTEROSCOPY;  Surgeon: Natale Milch, MD;  Location: [facility] ORS;  Service: Gynecology;  Laterality: N/A;  . ESOPHAGOGASTRODUODENOSCOPY (EGD) WITH PROPOFOL N/A 01/23/2020   Procedure: ESOPHAGOGASTRODUODENOSCOPY (EGD) WITH PROPOFOL;  Surgeon: Midge Minium, MD;  Location: [facility] ENDOSCOPY;  Service: Endoscopy;  Laterality: N/A;  . GIVENS CAPSULE STUDY N/A 02/09/2020   Procedure: GIVENS CAPSULE STUDY;  Surgeon: Midge Minium, MD;  Location: [facility] ENDOSCOPY;  Service: Endoscopy;  Laterality: N/A;  . LAPAROSCOPIC GASTRIC RESTRICTIVE DUODENAL PROCEDURE (DUODENAL SWITCH) N/A 08/28/2015   Procedure: LAPAROSCOPIC GASTRIC RESTRICTIVE DUODENAL PROCEDURE (DUODENAL SWITCH);  Surgeon: [name], MD;   Location: [facility] ORS;  Service: General;  Laterality: N/A;  . 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 Marital status: Single    Spouse name: Not on file  . Number of children: 0  . Years of education: Not on file  . Highest education level: Not on file  Occupational History    Employer: [employer]  Tobacco Use  . Smoking status: Never Smoker  . Smokeless tobacco: Never Used  Substance and Sexual Activity  . Alcohol use: Not Currently    Alcohol/week: 0.0 standard drinks  . Drug use: No  . Sexual activity: Not Currently    Birth control/protection: Other-see comments    Comment: depoprovera every 3 months  Other Topics Concern  . Not on file  Social History Narrative  . Not on file   Social Determinants of Health   Financial Resource Strain:   . Difficulty of Paying Living Expenses:   Food Insecurity:   . Worried About Programme researcher, broadcasting/film/video in the Last Year:   . [employer]  of Food in the Last Year:   Transportation Needs:   . Freight forwarderLack of Transportation (Medical):   Marland Kitchen. Lack of Transportation (Non-Medical):   Physical Activity:   . Days of Exercise per Week:   . Minutes of Exercise per Session:   Stress:   . Feeling of Stress :   Social Connections:   . Frequency of Communication with Friends and Family:   . Frequency of Social Gatherings with Friends and Family:   . Attends Religious Services:   . Active Member of Clubs or Organizations:   . Attends BankerClub or Organization Meetings:   Marland Kitchen. Marital Status:   Intimate Partner Violence:   . Fear of Current or Ex-Partner:   . Emotionally Abused:   Marland Kitchen. Physically Abused:   . Sexually Abused:    Current Outpatient Medications on File Prior to Visit  Medication Sig Dispense Refill  . albuterol (PROVENTIL HFA;VENTOLIN HFA) 108 (90 Base) MCG/ACT inhaler Inhale 2 puffs into the lungs every 6 (six) hours as needed for wheezing or shortness of breath. Reported on 05/01/2016 1 Inhaler 1  . azelastine  (OPTIVAR) 0.05 % ophthalmic solution Place 1 drop into both eyes 2 (two) times daily as needed (for allergic eye). Reported on 05/01/2016    . clindamycin (CLEOCIN T) 1 % external solution Apply 1 application topically 2 (two) times daily as needed (applied to breast, groin, and underarms as needed.).    Marland Kitchen. clindamycin (CLEOCIN) 300 MG capsule Take 300 mg by mouth See admin instructions. Take 1 capsule (300 mg) daily for 2 weeks as needed HS    . diclofenac Sodium (VOLTAREN) 1 % GEL Apply 2 g topically at bedtime.     Marland Kitchen. glucose blood (PRECISION QID TEST) test strip Use 1 strip  twice a day  [dx 250.02]    . ketoconazole (NIZORAL) 2 % shampoo Apply 1 application topically 3 (three) times a week. Use 2-3 times a week. Leave in for 5 minutes before rinsing out    . lamoTRIgine (LAMICTAL) 100 MG tablet 100 mg 2 (two) times daily.     Marland Kitchen. levocetirizine (XYZAL) 5 MG tablet Take 1 tablet (5 mg total) by mouth every evening. Reported on 05/01/2016 30 tablet 1  . medroxyPROGESTERone (DEPO-PROVERA) 150 MG/ML injection Inject 1 mL (150 mg total) into the muscle every 3 (three) months. 1 mL 3  . metFORMIN (GLUCOPHAGE-XR) 500 MG 24 hr tablet TAKE 1 TABLET(500 MG) BY MOUTH TWICE DAILY WITH A MEAL 180 tablet 3  . montelukast (SINGULAIR) 10 MG tablet Take 10 mg by mouth at bedtime.    . Olopatadine HCl 0.6 % SOLN USE 1 SPRAY INTO THE NOSE DAILY AS NEEDED FOR ALLERGIES 30.5 g 0  . ondansetron (ZOFRAN) 4 MG tablet Take 1 tablet (4 mg total) by mouth every 6 (six) hours. 12 tablet 0  . pantoprazole (PROTONIX) 40 MG tablet Take 1 tablet (40 mg total) by mouth every morning. 90 tablet 1  . rifampin (RIFADIN) 300 MG capsule Take 300 mg by mouth See admin instructions. Take 1 capsule (300 mg) by mouth daily for 2 weeks for HS    . Semaglutide (RYBELSUS) 7 MG TABS Take 7 mg by mouth daily. 90 tablet 2  . sertraline (ZOLOFT) 100 MG tablet Take 100 mg by mouth daily.    . traZODone (DESYREL) 50 MG tablet Take 1 tablet by mouth at  bedtime.    . triamcinolone ointment (KENALOG) 0.1 % Apply 1 application topically 2 (two) times daily as  needed (applied to affected areas of scalp).    . verapamil (CALAN-SR) 120 MG CR tablet Take 1 tablet (120 mg total) by mouth daily. 30 tablet 1  . WIXELA INHUB 250-50 MCG/DOSE AEPB INL 1 PUFF PO BID     Current Facility-Administered Medications on File Prior to Visit  Medication Dose Route Frequency Provider Last Rate Last Admin  . polyethylene glycol (MIRALAX / GLYCOLAX) packet 17 g  17 g Oral Daily Schuman, Christanna R, MD       Allergies  Allergen Reactions  . Ibuprofen Other (See Comments)    Upset stomach   . Caffeine Other (See Comments)    Stomach issues   Family History  Problem Relation Age of Onset  . Hypertension Mother   . Diabetes Mother   . Asthma Brother   . Breast cancer Neg Hx   . Colon cancer Neg Hx     PE: There were no vitals taken for this visit. There is no height or weight on file to calculate BMI. Wt Readings from Last 3 Encounters:  02/06/20 221 lb (100.2 kg)  01/30/20 230 lb (104.3 kg)  01/23/20 230 lb (104.3 kg)  : Constitutional:  in NAD  The physical exam was not performed (virtual visit).  ASSESSMENT: 1. DM2, non-insulin-dependent, uncontrolled, without complications - MAU  2. Obesity class III  3.  Microalbuminuria  PLAN:  1. Patient with longstanding, uncontrolled, type 2 diabetes, greatly improved after her gastric bypass surgery in 2016.  She was able to come off all of her medicines.  However, afterwards, sugars increased and we had to start back on Metformin. She tried a Reuel Boom fast [employer] and she did very well on this.  However, she is now off the diet.  At last visit, HbA1c was higher, at 6.7% and she was having slightly higher blood sugars than target, with occasional hyperglycemic spikes.  We discussed about adding a p.o. GLP-1 receptor agonist.  We started Rybelsus 3 mg daily in 10/2019 and she tolerated it well.  We  increased to 7 mg daily.  She is tolerating this well.  I also discussed with her at last visit to try not to forget Metformin, as she was forgetting the evening dose. -We reviewed together latest HbA1c which is significantly improved, to 5.9% -checked last month -At this visit, we reviewed her sugars and they appear to be almost all at goal. She had one CBG higher than goal before dinner, which was checked after a snack. This is significant improvement since last visit! -We will continue the current regimen-no changes needed -I refilled her Metformin prescription - I advised her to: Patient Instructions  Please  continue: - Metformin ER 500 mg 2x a day - Rybelsus 7 mg before breakfast  Please come back for a follow-up appointment in 4 months.  - we will check her HbA1c at next visit - advised to check sugars at different times of the day - 1x a day, rotating check times - advised for yearly eye exams >> she is UTD - return to clinic in 4 months      2.  Obesity class III -She is status post gastric sleeve surgery in 2016 -Before last visit, she gained 7 pounds -Unfortunately, we could not continue the SGLT 2 inhibitor due to dehydration -At last visit we added an oral GLP-1 receptor agonist.  This should also help with weight loss -She lost 18 pounds since last visit!!! (239 lbs then)  3. MAU -She  has a history of microalbuminuria -We tried to add SGLT 2 inhibitors last year to both help her diabetes and also delay progression to her CKD.  However, she developed severe dehydration so he had to stop.  -Latest ACR reviewed and this was normal in 12/2019  Carlus Pavlov, MD PhD [facility] Endocrinology

## 2020-02-22 NOTE — Patient Instructions (Signed)
Please  continue: - Metformin ER 500 mg 2x a day - Rybelsus 7 mg before breakfast  Please come back for a follow-up appointment in 4 months.

## 2020-02-23 NOTE — Telephone Encounter (Signed)
Pt stated that tattoo is starting to look better. Stated that it looks like it pushed out some of the ink at the top of the tattoo. No pain, redness, drainage, swelling. Pt is going to monitor and send update over the weekend.

## 2020-02-28 ENCOUNTER — Encounter: Payer: Self-pay | Admitting: Internal Medicine

## 2020-02-28 MED ORDER — CONTOUR NEXT TEST VI STRP: ORAL_STRIP | 12 refills | Status: AC

## 2020-02-28 MED ORDER — BLOOD GLUCOSE METER KIT: PACK | 0 refills | Status: AC

## 2020-02-28 MED ORDER — CONTOUR NEXT ONE KIT: 1.0000 | PACK | 0 refills | Status: DC

## 2020-02-29 ENCOUNTER — Ambulatory Visit: Payer: Managed Care, Other (non HMO) | Admitting: Gastroenterology

## 2020-03-06 ENCOUNTER — Ambulatory Visit: Payer: Managed Care, Other (non HMO) | Attending: Internal Medicine

## 2020-03-06 DIAGNOSIS — Z23 Encounter for immunization: Secondary | ICD-10-CM

## 2020-03-06 NOTE — Progress Notes (Signed)
   Covid-19 Vaccination Clinic  Name:  Shirley [name]    MRN: [id number] DOB: 1981/09/15  03/06/2020  [name] was observed post Covid-19 immunization for 15 minutes without incident. She was provided with Vaccine Information Sheet and instruction to access the V-Safe system.   [name] was instructed to call 911 with any severe reactions post vaccine: Marland Kitchen Difficulty breathing  . Swelling of face and throat  . A fast heartbeat  . A bad rash all over body  . Dizziness and weakness   Immunizations Administered    Name Date Dose VIS Date Route   Pfizer COVID-19 Vaccine 03/06/2020  9:10 AM 0.3 mL 11/04/2019 Intramuscular   Manufacturer: [employer], [employer]   Lot: [id number]   NDC: [address]-9

## 2020-03-14 ENCOUNTER — Encounter: Payer: Self-pay | Admitting: Internal Medicine

## 2020-03-14 ENCOUNTER — Other Ambulatory Visit: Payer: Self-pay

## 2020-03-16 ENCOUNTER — Other Ambulatory Visit: Payer: Self-pay

## 2020-03-16 MED ORDER — VERAPAMIL HCL ER 120 MG PO TBCR: 120.0000 mg | EXTENDED_RELEASE_TABLET | Freq: Every day | ORAL | 5 refills | Status: DC

## 2020-03-27 LAB — BASIC METABOLIC PANEL
BUN/Creatinine Ratio: 10 (ref 9–23)
BUN: 8 mg/dL (ref 6–24)
CO2: 20 mmol/L (ref 20–29)
Calcium: 9.1 mg/dL (ref 8.7–10.2)
Chloride: 102 mmol/L (ref 96–106)
Creatinine, Ser: 0.77 mg/dL (ref 0.57–1.00)
GFR calc Af Amer: 112 mL/min/{1.73_m2} (ref >59)
GFR calc non Af Amer: 97 mL/min/{1.73_m2} (ref >59)
Glucose: 98 mg/dL (ref 65–99)
Potassium: 4.2 mmol/L (ref 3.5–5.2)
Sodium: 140 mmol/L (ref 134–144)

## 2020-03-27 LAB — CBC WITH DIFFERENTIAL/PLATELET
Basophils Absolute: 0 10*3/uL (ref 0.0–0.2)
Basos: 0 %
EOS (ABSOLUTE): 0 10*3/uL (ref 0.0–0.4)
Eos: 1 %
Hematocrit: 38.6 % (ref 34.0–46.6)
Hemoglobin: 11.6 g/dL (ref 11.1–15.9)
Immature Grans (Abs): 0 10*3/uL (ref 0.0–0.1)
Immature Granulocytes: 0 %
Lymphocytes Absolute: 2.2 10*3/uL (ref 0.7–3.1)
Lymphs: 44 %
MCH: 23 pg — ABNORMAL LOW (ref 26.6–33.0)
MCHC: 30.1 g/dL — ABNORMAL LOW (ref 31.5–35.7)
MCV: 76 fL — ABNORMAL LOW (ref 79–97)
Monocytes Absolute: 0.4 10*3/uL (ref 0.1–0.9)
Monocytes: 7 %
Neutrophils Absolute: 2.3 10*3/uL (ref 1.4–7.0)
Neutrophils: 48 %
Platelets: 256 10*3/uL (ref 150–450)
RBC: 5.05 x10E6/uL (ref 3.77–5.28)
RDW: 15.5 % — ABNORMAL HIGH (ref 11.7–15.4)
WBC: 5 10*3/uL (ref 3.4–10.8)

## 2020-03-27 LAB — HEPATIC FUNCTION PANEL
ALT: 12 IU/L (ref 0–32)
AST: 16 IU/L (ref 0–40)
Albumin: 4.1 g/dL (ref 3.8–4.8)
Alkaline Phosphatase: 109 IU/L (ref 39–117)
Bilirubin Total: 0.4 mg/dL (ref 0.0–1.2)
Bilirubin, Direct: 0.14 mg/dL (ref 0.00–0.40)
Total Protein: 6.7 g/dL (ref 6.0–8.5)

## 2020-03-27 LAB — FERRITIN: Ferritin: 348 ng/mL — ABNORMAL HIGH (ref 15–150)

## 2020-03-27 LAB — IRON AND TIBC
Iron Saturation: 45 % (ref 15–55)
Iron: 127 ug/dL (ref 27–159)
Total Iron Binding Capacity: 283 ug/dL (ref 250–450)
UIBC: 156 ug/dL (ref 131–425)

## 2020-03-27 LAB — LIPID PANEL
Chol/HDL Ratio: 2.3 ratio (ref 0.0–4.4)
Cholesterol, Total: 148 mg/dL (ref 100–199)
HDL: 65 mg/dL (ref >39)
LDL Chol Calc (NIH): 71 mg/dL (ref 0–99)
Triglycerides: 58 mg/dL (ref 0–149)
VLDL Cholesterol Cal: 12 mg/dL (ref 5–40)

## 2020-03-27 LAB — HEMOGLOBIN A1C
Est. average glucose Bld gHb Est-mCnc: 120 mg/dL
Hgb A1c MFr Bld: 5.8 % — ABNORMAL HIGH (ref 4.8–5.6)

## 2020-04-03 ENCOUNTER — Ambulatory Visit (INDEPENDENT_AMBULATORY_CARE_PROVIDER_SITE_OTHER): Payer: Managed Care, Other (non HMO)

## 2020-04-03 ENCOUNTER — Other Ambulatory Visit: Payer: Self-pay

## 2020-04-03 ENCOUNTER — Inpatient Hospital Stay: Payer: Managed Care, Other (non HMO) | Attending: Oncology

## 2020-04-03 DIAGNOSIS — Z3042 Encounter for surveillance of injectable contraceptive: Secondary | ICD-10-CM

## 2020-04-03 DIAGNOSIS — D509 Iron deficiency anemia, unspecified: Secondary | ICD-10-CM | POA: Diagnosis not present

## 2020-04-03 LAB — CBC WITH DIFFERENTIAL/PLATELET
Abs Immature Granulocytes: 0.02 10*3/uL (ref 0.00–0.07)
Basophils Absolute: 0 10*3/uL (ref 0.0–0.1)
Basophils Relative: 0 %
Eosinophils Absolute: 0 10*3/uL (ref 0.0–0.5)
Eosinophils Relative: 0 %
HCT: 36.3 % (ref 36.0–46.0)
Hemoglobin: 11.3 g/dL — ABNORMAL LOW (ref 12.0–15.0)
Immature Granulocytes: 0 %
Lymphocytes Relative: 48 %
Lymphs Abs: 3.9 10*3/uL (ref 0.7–4.0)
MCH: 23.4 pg — ABNORMAL LOW (ref 26.0–34.0)
MCHC: 31.1 g/dL (ref 30.0–36.0)
MCV: 75.2 fL — ABNORMAL LOW (ref 80.0–100.0)
Monocytes Absolute: 0.6 10*3/uL (ref 0.1–1.0)
Monocytes Relative: 7 %
Neutro Abs: 3.6 10*3/uL (ref 1.7–7.7)
Neutrophils Relative %: 45 %
Platelets: 248 10*3/uL (ref 150–400)
RBC: 4.83 MIL/uL (ref 3.87–5.11)
RDW: 15.5 % (ref 11.5–15.5)
WBC: 8.2 10*3/uL (ref 4.0–10.5)
nRBC: 0 % (ref 0.0–0.2)

## 2020-04-03 LAB — IRON AND TIBC
Iron: 96 ug/dL (ref 28–170)
Saturation Ratios: 30 % (ref 10.4–31.8)
TIBC: 325 ug/dL (ref 250–450)
UIBC: 229 ug/dL

## 2020-04-03 LAB — FERRITIN: Ferritin: 181 ng/mL (ref 11–307)

## 2020-04-03 MED ORDER — MEDROXYPROGESTERONE ACETATE 150 MG/ML IM SUSP
150.0000 mg | Freq: Once | INTRAMUSCULAR | Status: AC
  Administered 2020-04-03: 150 mg via INTRAMUSCULAR

## 2020-04-05 ENCOUNTER — Other Ambulatory Visit: Payer: Self-pay | Admitting: Obstetrics and Gynecology

## 2020-04-05 LAB — HGB FRACTIONATION CASCADE
Hgb A2: 2.6 % (ref 1.8–3.2)
Hgb A: 97.4 % (ref 96.4–98.8)
Hgb F: 0 % (ref 0.0–2.0)
Hgb S: 0 %

## 2020-04-05 LAB — COPPER, SERUM: Copper: 131 ug/dL (ref 80–158)

## 2020-04-06 ENCOUNTER — Inpatient Hospital Stay: Payer: Managed Care, Other (non HMO)

## 2020-04-06 ENCOUNTER — Ambulatory Visit: Payer: Managed Care, Other (non HMO)

## 2020-04-10 ENCOUNTER — Inpatient Hospital Stay (HOSPITAL_BASED_OUTPATIENT_CLINIC_OR_DEPARTMENT_OTHER): Payer: Managed Care, Other (non HMO) | Admitting: Oncology

## 2020-04-10 ENCOUNTER — Telehealth: Payer: Self-pay | Admitting: Internal Medicine

## 2020-04-10 DIAGNOSIS — D509 Iron deficiency anemia, unspecified: Secondary | ICD-10-CM

## 2020-04-10 NOTE — Telephone Encounter (Signed)
Pt given lab results from 03/26/20

## 2020-04-10 NOTE — Telephone Encounter (Signed)
Patient received a letter from office to call office. Patient's phone number has been verified.

## 2020-04-12 NOTE — Progress Notes (Signed)
I connected with Shirley [name] on 04/12/20 at  2:15 PM EDT by video enabled telemedicine visit and verified that I am speaking with the correct person using two identifiers.   I discussed the limitations, risks, security and privacy concerns of performing an evaluation and management service by telemedicine and the availability of in-person appointments. I also discussed with the patient that there may be a patient responsible charge related to this service. The patient expressed understanding and agreed to proceed.  Other persons participating in the visit and their role in the encounter:  none  Patient's location:  home Provider's location:  work  Risk analyst Complaint:  Routine f/u of iron deficiency anemia  History of present illness: Patient is a 39 year old African-American female who was seen by Dr. Bonna Gains for iron deficiency anemia.  She has undergone sleeve gastrectomy in 2016 and also had a duodenal switch with repair of hiatal hernia without fundoplication in October 0174.  Patient recently underwent upper endoscopy and colonoscopy on 01/23/2020 which showed a small hiatal hernia and a 2 mm polyp in the descending colon as well as diverticulosis and nonbleeding internal hemorrhoids.  Patient denies any heavy menstrual bleeding.  She denies any consistent use of NSAIDs.  No family history of colon cancer  Patient's most recent blood work from 01/04/2020 showed a low ferritin of 20.  Iron studies normal iron saturation 36%.  B12 levels were normal at 666.  CBC showed white cell count of 7, H&H 11.3/37.4 with an MCV of 75 and a platelet count of 262.  Interval history: patient currently reports doing well. Denies any complaints at this time.    Review of Systems  Constitutional: Negative for chills, fever, malaise/fatigue and weight loss.  HENT: Negative for congestion, ear discharge and nosebleeds.   Eyes: Negative for blurred vision.  Respiratory: Negative for cough, hemoptysis, sputum  production, shortness of breath and wheezing.   Cardiovascular: Negative for chest pain, palpitations, orthopnea and claudication.  Gastrointestinal: Negative for abdominal pain, blood in stool, constipation, diarrhea, heartburn, melena, nausea and vomiting.  Genitourinary: Negative for dysuria, flank pain, frequency, hematuria and urgency.  Musculoskeletal: Negative for back pain, joint pain and myalgias.  Skin: Negative for rash.  Neurological: Negative for dizziness, tingling, focal weakness, seizures, weakness and headaches.  Endo/Heme/Allergies: Does not bruise/bleed easily.  Psychiatric/Behavioral: Negative for depression and suicidal ideas. The patient does not have insomnia.     Allergies  Allergen Reactions  . Ibuprofen Other (See Comments)    Upset stomach   . Caffeine Other (See Comments)    Stomach issues    Past Medical History:  Diagnosis Date  . Anemia   . Asthma    WELL CONTROLLED  . Complication of anesthesia    -TACYCARDIA AFTER GASTRIC SURGERY IN 2016 AND HAD TO STAY 5 DAYS-itching after surgery 2005, 2006  . Depression   . Diabetes mellitus ([place])   . Fatty liver   . GERD (gastroesophageal reflux disease)   . Heart murmur   . History of dermoid cyst excision   . History of hiatal hernia   . Hypertension    H/O  . IBS (irritable bowel syndrome)   . Migraine   . PONV (postoperative nausea and vomiting)    nausea    Past Surgical History:  Procedure Laterality Date  . CHOLECYSTECTOMY  2005  . COLONOSCOPY WITH PROPOFOL N/A 01/23/2020   Procedure: COLONOSCOPY WITH PROPOFOL;  Surgeon: Lucilla Lame, MD;  Location: [facility] ENDOSCOPY;  Service: Endoscopy;  Laterality: N/A;  .  DILATATION & CURETTAGE/HYSTEROSCOPY WITH MYOSURE N/A 03/30/2018   Procedure: DILATATION & CURETTAGE/HYSTEROSCOPY;  Surgeon: Homero Fellers, MD;  Location: [facility] ORS;  Service: Gynecology;  Laterality: N/A;  . ESOPHAGOGASTRODUODENOSCOPY (EGD) WITH PROPOFOL N/A 01/23/2020   Procedure:  ESOPHAGOGASTRODUODENOSCOPY (EGD) WITH PROPOFOL;  Surgeon: Lucilla Lame, MD;  Location: [facility] ENDOSCOPY;  Service: Endoscopy;  Laterality: N/A;  . GIVENS CAPSULE STUDY N/A 02/09/2020   Procedure: GIVENS CAPSULE STUDY;  Surgeon: Lucilla Lame, MD;  Location: [facility] ENDOSCOPY;  Service: Endoscopy;  Laterality: N/A;  . LAPAROSCOPIC GASTRIC RESTRICTIVE DUODENAL PROCEDURE (DUODENAL SWITCH) N/A 08/28/2015   Procedure: LAPAROSCOPIC GASTRIC RESTRICTIVE DUODENAL PROCEDURE (DUODENAL SWITCH);  Surgeon: Ladora Daniel, MD;  Location: [facility] ORS;  Service: General;  Laterality: N/A;  . 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 Marital status: Single    Spouse name: Not on file  . Number of children: 0  . Years of education: Not on file  . Highest education level: Not on file  Occupational History    Employer: [employer]  Tobacco Use  . Smoking status: Never Smoker  . Smokeless tobacco: Never Used  Substance and Sexual Activity  . Alcohol use: Not Currently    Alcohol/week: 0.0 standard drinks  . Drug use: No  . Sexual activity: Not Currently    Birth control/protection: Other-see comments    Comment: depoprovera every 3 months  Other Topics Concern  . Not on file  Social History Narrative  . Not on file   Social Determinants of Health   Financial Resource Strain:   . Difficulty of Paying Living Expenses:   Food Insecurity:   . Worried About Charity fundraiser in the Last Year:   . Arboriculturist in the Last Year:   Transportation Needs:   . Film/video editor (Medical):   Marland Kitchen Lack of Transportation (Non-Medical):   Physical Activity:   . Days of Exercise per Week:   . Minutes of Exercise per Session:   Stress:   . Feeling of Stress :   Social Connections:   . Frequency of Communication with Friends and Family:   . Frequency of Social Gatherings with Friends and Family:   . Attends Religious Services:   . Active Member of Clubs or Organizations:    . Attends Archivist Meetings:   Marland Kitchen Marital Status:   Intimate Partner Violence:   . Fear of Current or Ex-Partner:   . Emotionally Abused:   Marland Kitchen Physically Abused:   . Sexually Abused:     Family History  Problem Relation Age of Onset  . Hypertension Mother   . Diabetes Mother   . Asthma Brother   . Breast cancer Neg Hx   . Colon cancer Neg Hx      Current Outpatient Medications:  .  albuterol (PROVENTIL HFA;VENTOLIN HFA) 108 (90 Base) MCG/ACT inhaler, Inhale 2 puffs into the lungs every 6 (six) hours as needed for wheezing or shortness of breath. Reported on 05/01/2016, Disp: 1 Inhaler, Rfl: 1 .  azelastine (OPTIVAR) 0.05 % ophthalmic solution, Place 1 drop into both eyes 2 (two) times daily as needed (for allergic eye). Reported on 05/01/2016, Disp: , Rfl:  .  blood glucose meter kit and supplies, Dispense based on insurance preference. Check glucose once daily, Disp: 1 each, Rfl: 0 .  Blood Glucose Monitoring Suppl (CONTOUR NEXT ONE) KIT, 1 kit by Does not apply route See admin instructions. To  check blood sugar once daily, Disp: 1 kit, Rfl: 0 .  clindamycin (CLEOCIN T) 1 % external solution, Apply 1 application topically 2 (two) times daily as needed (applied to breast, groin, and underarms as needed.)., Disp: , Rfl:  .  clindamycin (CLEOCIN) 300 MG capsule, Take 300 mg by mouth See admin instructions. Take 1 capsule (300 mg) daily for 2 weeks as needed HS, Disp: , Rfl:  .  diclofenac Sodium (VOLTAREN) 1 % GEL, Apply 2 g topically at bedtime. , Disp: , Rfl:  .  glucose blood (CONTOUR NEXT TEST) test strip, Use to check blood sugar once a day., Disp: 100 each, Rfl: 12 .  ketoconazole (NIZORAL) 2 % shampoo, Apply 1 application topically 3 (three) times a week. Use 2-3 times a week. Leave in for 5 minutes before rinsing out, Disp: , Rfl:  .  lamoTRIgine (LAMICTAL) 100 MG tablet, 100 mg 2 (two) times daily. , Disp: , Rfl:  .  levocetirizine (XYZAL) 5 MG tablet, Take 1 tablet (5 mg  total) by mouth every evening. Reported on 05/01/2016, Disp: 30 tablet, Rfl: 1 .  medroxyPROGESTERone (DEPO-PROVERA) 150 MG/ML injection, Inject 1 mL (150 mg total) into the muscle every 3 (three) months., Disp: 1 mL, Rfl: 3 .  metFORMIN (GLUCOPHAGE-XR) 500 MG 24 hr tablet, TAKE 1 TABLET(500 MG) BY MOUTH TWICE DAILY WITH A MEAL, Disp: 180 tablet, Rfl: 3 .  montelukast (SINGULAIR) 10 MG tablet, Take 10 mg by mouth at bedtime., Disp: , Rfl:  .  naproxen (NAPROSYN) 500 MG tablet, TAKE 1 TABLET(500 MG) BY MOUTH TWICE DAILY WITH A MEAL AS NEEDED FOR PAIN, Disp: 60 tablet, Rfl: 2 .  Olopatadine HCl 0.6 % SOLN, USE 1 SPRAY INTO THE NOSE DAILY AS NEEDED FOR ALLERGIES, Disp: 30.5 g, Rfl: 0 .  ondansetron (ZOFRAN) 4 MG tablet, Take 1 tablet (4 mg total) by mouth every 6 (six) hours., Disp: 12 tablet, Rfl: 0 .  pantoprazole (PROTONIX) 40 MG tablet, Take 1 tablet (40 mg total) by mouth every morning., Disp: 90 tablet, Rfl: 1 .  rifampin (RIFADIN) 300 MG capsule, Take 300 mg by mouth See admin instructions. Take 1 capsule (300 mg) by mouth daily for 2 weeks for HS, Disp: , Rfl:  .  Semaglutide (RYBELSUS) 7 MG TABS, Take 7 mg by mouth daily., Disp: 90 tablet, Rfl: 2 .  sertraline (ZOLOFT) 100 MG tablet, Take 100 mg by mouth daily., Disp: , Rfl:  .  traZODone (DESYREL) 50 MG tablet, Take 1 tablet by mouth at bedtime., Disp: , Rfl:  .  triamcinolone ointment (KENALOG) 0.1 %, Apply 1 application topically 2 (two) times daily as needed (applied to affected areas of scalp)., Disp: , Rfl:  .  verapamil (CALAN-SR) 120 MG CR tablet, Take 1 tablet (120 mg total) by mouth daily., Disp: 30 tablet, Rfl: 5 .  WIXELA INHUB 250-50 MCG/DOSE AEPB, INL 1 PUFF PO BID, Disp: , Rfl:   Current Facility-Administered Medications:  .  polyethylene glycol (MIRALAX / GLYCOLAX) packet 17 g, 17 g, Oral, Daily, Schuman, Christanna R, MD  No results found.  No images are attached to the encounter.   CMP Latest Ref Rng & Units 03/26/2020   Glucose 65 - 99 mg/dL 98  BUN 6 - 24 mg/dL 8  Creatinine 0.57 - 1.00 mg/dL 0.77  Sodium 134 - 144 mmol/L 140  Potassium 3.5 - 5.2 mmol/L 4.2  Chloride 96 - 106 mmol/L 102  CO2 20 - 29 mmol/L 20  Calcium  8.7 - 10.2 mg/dL 9.1  Total Protein 6.0 - 8.5 g/dL 6.7  Total Bilirubin 0.0 - 1.2 mg/dL 0.4  Alkaline Phos 39 - 117 IU/L 109  AST 0 - 40 IU/L 16  ALT 0 - 32 IU/L 12   CBC Latest Ref Rng & Units 04/03/2020  WBC 4.0 - 10.5 K/uL 8.2  Hemoglobin 12.0 - 15.0 g/dL 11.3(L)  Hematocrit 36.0 - 46.0 % 36.3  Platelets 150 - 400 K/uL 248     Observation/objective:appears in no acute distress over video visit today. Breathing is non labored  Assessment and plan:Patient is a 39 yr old female referred for iron deficiency anemia here for routine f/u  Patients ferritin came up from 20 to 181. Iron studies are normal. No significant improvement in anemia though. Hb remains around 11.3. persistent microcytosis. Hb electropheresis and copper levels are normal.   I am inclined to monitor this conservatively.   Follow-up instructions:cbc ferritin and ironstudies in 6 months followed by video visit  I discussed the assessment and treatment plan with the patient. The patient was provided an opportunity to ask questions and all were answered. The patient agreed with the plan and demonstrated an understanding of the instructions.   The patient was advised to call back or seek an in-person evaluation if the symptoms worsen or if the condition fails to improve as anticipated.    Visit Diagnosis: 1. Iron deficiency anemia, unspecified iron deficiency anemia type     Dr. Randa Evens, MD, MPH [place] [facility] Tel- [phone] 04/12/2020 1:16 PM

## 2020-05-30 ENCOUNTER — Ambulatory Visit (INDEPENDENT_AMBULATORY_CARE_PROVIDER_SITE_OTHER): Payer: Managed Care, Other (non HMO) | Admitting: Internal Medicine

## 2020-05-30 ENCOUNTER — Encounter: Payer: Self-pay | Admitting: Internal Medicine

## 2020-05-30 ENCOUNTER — Other Ambulatory Visit: Payer: Self-pay

## 2020-05-30 VITALS — BP 132/78 | HR 95 | Temp 98.0°F | Resp 16 | Ht 61.0 in | Wt 236.0 lb

## 2020-05-30 DIAGNOSIS — F331 Major depressive disorder, recurrent, moderate: Secondary | ICD-10-CM

## 2020-05-30 DIAGNOSIS — I10 Essential (primary) hypertension: Secondary | ICD-10-CM

## 2020-05-30 DIAGNOSIS — Z1231 Encounter for screening mammogram for malignant neoplasm of breast: Secondary | ICD-10-CM

## 2020-05-30 DIAGNOSIS — K219 Gastro-esophageal reflux disease without esophagitis: Secondary | ICD-10-CM

## 2020-05-30 DIAGNOSIS — F439 Reaction to severe stress, unspecified: Secondary | ICD-10-CM

## 2020-05-30 DIAGNOSIS — D509 Iron deficiency anemia, unspecified: Secondary | ICD-10-CM

## 2020-05-30 DIAGNOSIS — R928 Other abnormal and inconclusive findings on diagnostic imaging of breast: Secondary | ICD-10-CM

## 2020-05-30 DIAGNOSIS — E119 Type 2 diabetes mellitus without complications: Secondary | ICD-10-CM

## 2020-05-30 DIAGNOSIS — R519 Headache, unspecified: Secondary | ICD-10-CM

## 2020-05-30 NOTE — Progress Notes (Signed)
Patient ID: Shirley [name], female   DOB: 05/14/1981, 39 y.o.   MRN: [id number]   Subjective:    Patient ID: Shirley [name], female    DOB: 1981-07-09, 39 y.o.   MRN: [id number]  HPI This visit occurred during the SARS-CoV-2 public health emergency.  Safety protocols were in place, including screening questions prior to the visit, additional usage of staff PPE, and extensive cleaning of exam room while observing appropriate contact time as indicated for disinfecting solutions.  Patient here for a scheduled follow up.  She reports she is doing relatively well. Seeing endocrinology for her diabetes.  On metformin and rybelsus. a1c 5.9%. am sugars averaging low 100s. PM sugars 140-180.    Seeing Dr Janese Banks - for IDA.  No chest pain or sob reported.  No abdominal pain.  Bowels moving.  Handling stress.  Seeing psychiatry - Dr Nicolasa Ducking.  abilify recently added.  Having headaches.  Seeing neurology.   Was given sumatriptan to take as needed.  imitrex does abort the headache, but makes  Her queezy.  Due f/u breast exam.     Past Medical History:  Diagnosis Date  . Anemia   . Asthma    WELL CONTROLLED  . Complication of anesthesia    -TACYCARDIA AFTER GASTRIC SURGERY IN 2016 AND HAD TO STAY 5 DAYS-itching after surgery 2005, 2006  . Depression   . Diabetes mellitus ([place])   . Fatty liver   . GERD (gastroesophageal reflux disease)   . Heart murmur   . History of dermoid cyst excision   . History of hiatal hernia   . Hypertension    H/O  . IBS (irritable bowel syndrome)   . Migraine   . PONV (postoperative nausea and vomiting)    nausea   Past Surgical History:  Procedure Laterality Date  . CHOLECYSTECTOMY  2005  . COLONOSCOPY WITH PROPOFOL N/A 01/23/2020   Procedure: COLONOSCOPY WITH PROPOFOL;  Surgeon: Lucilla Lame, MD;  Location: [facility] ENDOSCOPY;  Service: Endoscopy;  Laterality: N/A;  . DILATATION & CURETTAGE/HYSTEROSCOPY WITH MYOSURE N/A 03/30/2018   Procedure: DILATATION &  CURETTAGE/HYSTEROSCOPY;  Surgeon: Homero Fellers, MD;  Location: [facility] ORS;  Service: Gynecology;  Laterality: N/A;  . ESOPHAGOGASTRODUODENOSCOPY (EGD) WITH PROPOFOL N/A 01/23/2020   Procedure: ESOPHAGOGASTRODUODENOSCOPY (EGD) WITH PROPOFOL;  Surgeon: Lucilla Lame, MD;  Location: [facility] ENDOSCOPY;  Service: Endoscopy;  Laterality: N/A;  . GIVENS CAPSULE STUDY N/A 02/09/2020   Procedure: GIVENS CAPSULE STUDY;  Surgeon: Lucilla Lame, MD;  Location: [facility] ENDOSCOPY;  Service: Endoscopy;  Laterality: N/A;  . LAPAROSCOPIC GASTRIC RESTRICTIVE DUODENAL PROCEDURE (DUODENAL SWITCH) N/A 08/28/2015   Procedure: LAPAROSCOPIC GASTRIC RESTRICTIVE DUODENAL PROCEDURE (DUODENAL SWITCH);  Surgeon: Ladora Daniel, MD;  Location: [facility] ORS;  Service: General;  Laterality: N/A;  . 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 Hypertension Mother   . Diabetes Mother   . Asthma Brother   . Breast cancer Neg Hx   . Colon cancer Neg Hx    Social History   Socioeconomic History  . Marital status: Single    Spouse name: Not on file  . Number of children: 0  . Years of education: Not on file  . Highest education level: Not on file  Occupational History    Employer: [employer]  Tobacco Use  . Smoking status: Never Smoker  . Smokeless tobacco: Never Used  Vaping Use  . Vaping Use: Never used  Substance and  Sexual Activity  . Alcohol use: Not Currently    Alcohol/week: 0.0 standard drinks  . Drug use: No  . Sexual activity: Not Currently    Birth control/protection: Other-see comments    Comment: depoprovera every 3 months  Other Topics Concern  . Not on file  Social History Narrative  . Not on file   Social Determinants of Health   Financial Resource Strain:   . Difficulty of Paying Living Expenses:   Food Insecurity:   . Worried About Charity fundraiser in the Last Year:   . Arboriculturist in the Last Year:   Transportation Needs:   . Lexicographer (Medical):   Marland Kitchen Lack of Transportation (Non-Medical):   Physical Activity:   . Days of Exercise per Week:   . Minutes of Exercise per Session:   Stress:   . Feeling of Stress :   Social Connections:   . Frequency of Communication with Friends and Family:   . Frequency of Social Gatherings with Friends and Family:   . Attends Religious Services:   . Active Member of Clubs or Organizations:   . Attends Archivist Meetings:   Marland Kitchen Marital Status:     Outpatient Encounter Medications as of 05/30/2020  Medication Sig  . albuterol (PROVENTIL HFA;VENTOLIN HFA) 108 (90 Base) MCG/ACT inhaler Inhale 2 puffs into the lungs every 6 (six) hours as needed for wheezing or shortness of breath. Reported on 05/01/2016  . ARIPiprazole (ABILIFY) 5 MG tablet Take 5 mg by mouth every morning.  Marland Kitchen azelastine (OPTIVAR) 0.05 % ophthalmic solution Place 1 drop into both eyes 2 (two) times daily as needed (for allergic eye). Reported on 05/01/2016  . blood glucose meter kit and supplies Dispense based on insurance preference. Check glucose once daily  . Blood Glucose Monitoring Suppl (CONTOUR NEXT ONE) KIT 1 kit by Does not apply route See admin instructions. To check blood sugar once daily  . clindamycin (CLEOCIN T) 1 % external solution Apply 1 application topically 2 (two) times daily as needed (applied to breast, groin, and underarms as needed.).  Marland Kitchen clindamycin (CLEOCIN) 300 MG capsule Take 300 mg by mouth See admin instructions. Take 1 capsule (300 mg) daily for 2 weeks as needed HS  . diclofenac Sodium (VOLTAREN) 1 % GEL Apply 2 g topically at bedtime.   Marland Kitchen glucose blood (CONTOUR NEXT TEST) test strip Use to check blood sugar once a day.  . ketoconazole (NIZORAL) 2 % shampoo Apply 1 application topically 3 (three) times a week. Use 2-3 times a week. Leave in for 5 minutes before rinsing out  . lamoTRIgine (LAMICTAL) 100 MG tablet 100 mg 2 (two) times daily.   Marland Kitchen levocetirizine (XYZAL) 5 MG tablet  Take 1 tablet (5 mg total) by mouth every evening. Reported on 05/01/2016  . medroxyPROGESTERone (DEPO-PROVERA) 150 MG/ML injection Inject 1 mL (150 mg total) into the muscle every 3 (three) months.  . metFORMIN (GLUCOPHAGE-XR) 500 MG 24 hr tablet TAKE 1 TABLET(500 MG) BY MOUTH TWICE DAILY WITH A MEAL  . montelukast (SINGULAIR) 10 MG tablet Take 10 mg by mouth at bedtime.  . naproxen (NAPROSYN) 500 MG tablet TAKE 1 TABLET(500 MG) BY MOUTH TWICE DAILY WITH A MEAL AS NEEDED FOR PAIN  . Olopatadine HCl 0.6 % SOLN USE 1 SPRAY INTO THE NOSE DAILY AS NEEDED FOR ALLERGIES  . ondansetron (ZOFRAN) 4 MG tablet Take 1 tablet (4 mg total) by mouth every 6 (six) hours.  Marland Kitchen  pantoprazole (PROTONIX) 40 MG tablet Take 1 tablet (40 mg total) by mouth every morning.  . rifampin (RIFADIN) 300 MG capsule Take 300 mg by mouth See admin instructions. Take 1 capsule (300 mg) by mouth daily for 2 weeks for HS  . Semaglutide (RYBELSUS) 7 MG TABS Take 7 mg by mouth daily.  . sertraline (ZOLOFT) 100 MG tablet Take 100 mg by mouth daily.  . SUMAtriptan (IMITREX) 100 MG tablet Take 100 mg by mouth as directed.  . traZODone (DESYREL) 50 MG tablet Take 1 tablet by mouth at bedtime.  . triamcinolone ointment (KENALOG) 0.1 % Apply 1 application topically 2 (two) times daily as needed (applied to affected areas of scalp).  . verapamil (CALAN-SR) 120 MG CR tablet Take 1 tablet (120 mg total) by mouth daily.  Grant Ruts INHUB 250-50 MCG/DOSE AEPB INL 1 PUFF PO BID   Facility-Administered Encounter Medications as of 05/30/2020  Medication  . polyethylene glycol (MIRALAX / GLYCOLAX) packet 17 g   Review of Systems  Constitutional: Negative for appetite change and unexpected weight change.  HENT: Negative for congestion and sinus pressure.   Respiratory: Negative for cough, chest tightness and shortness of breath.   Cardiovascular: Negative for chest pain, palpitations and leg swelling.  Gastrointestinal: Negative for abdominal pain,  diarrhea, nausea and vomiting.  Genitourinary: Negative for difficulty urinating and dysuria.  Musculoskeletal: Negative for joint swelling and myalgias.  Skin: Negative for color change and rash.  Neurological: Positive for headaches. Negative for dizziness and light-headedness.  Psychiatric/Behavioral: Negative for agitation and dysphoric mood.       Objective:    Physical Exam Vitals reviewed.  Constitutional:      General: She is not in acute distress.    Appearance: Normal appearance.  HENT:     Head: Normocephalic and atraumatic.     Right Ear: External ear normal.     Left Ear: External ear normal.  Eyes:     General: No scleral icterus.       Right eye: No discharge.        Left eye: No discharge.     Conjunctiva/sclera: Conjunctivae normal.  Neck:     Thyroid: No thyromegaly.  Cardiovascular:     Rate and Rhythm: Normal rate and regular rhythm.  Pulmonary:     Effort: No respiratory distress.     Breath sounds: Normal breath sounds. No wheezing.  Abdominal:     General: Bowel sounds are normal.     Palpations: Abdomen is soft.     Tenderness: There is no abdominal tenderness.  Musculoskeletal:        General: No swelling or tenderness.     Cervical back: Neck supple. No tenderness.  Lymphadenopathy:     Cervical: No cervical adenopathy.  Skin:    Findings: No erythema or rash.  Neurological:     Mental Status: She is alert.  Psychiatric:        Mood and Affect: Mood normal.        Behavior: Behavior normal.     BP 132/78   Pulse 95   Temp 98 F (36.7 C)   Resp 16   Ht _0  (1.549 m)   Wt 236 lb (107 kg)   SpO2 99%   BMI 44.59 kg/m  Wt Readings from Last 3 Encounters:  05/30/20 236 lb (107 kg)  02/06/20 221 lb (100.2 kg)  01/30/20 230 lb (104.3 kg)     Lab Results  Component Value Date  WBC 8.2 04/03/2020   HGB 11.3 (L) 04/03/2020   HCT 36.3 04/03/2020   PLT 248 04/03/2020   GLUCOSE 98 03/26/2020   CHOL 148 03/26/2020   TRIG 58  03/26/2020   HDL 65 03/26/2020   LDLCALC 71 03/26/2020   ALT 12 03/26/2020   AST 16 03/26/2020   NA 140 03/26/2020   K 4.2 03/26/2020   CL 102 03/26/2020   CREATININE 0.77 03/26/2020   BUN 8 03/26/2020   CO2 20 03/26/2020   TSH 2.040 07/04/2019   HGBA1C 5.8 (H) 03/26/2020       Assessment & Plan:   Problem List Items Addressed This Visit    Abnormal mammogram    Last mammogram recommended f/u right breast ultrasound in 6 months.  Scheduled.        GERD (gastroesophageal reflux disease)    Controlled. On protonix.        Headache    Seeing Dr Manuella Ghazi.  On verapamil.  Has imitrex to take prn.  Works, but makes her queezy.  D/w neurology regarding further treatment options.        Relevant Medications   SUMAtriptan (IMITREX) 100 MG tablet   Hypertension    On verapamil.  Blood pressures doing better.  Follow pressures.  Follow metabolic panel.       Iron deficiency anemia    Followed by Dr Janese Banks.       Major depressive disorder, recurrent episode, moderate ([place])    Followed by psychiatry.  abilify recently added.  Doing well.        Stress    Increased stress.  Seeing psychiatry.  abilify recently added.  Continue f/u with psychiatry.        Type 2 diabetes mellitus without complication, without long-term current use of insulin ([facility])    Low carb diet and exercise.  Continue on rybelsus and metformin.  Seeing endocrinology.  Last a1c improved as outlined.         Other Visit Diagnoses    Visit for screening mammogram    -  Primary   Relevant Orders   MM 3D SCREEN BREAST BILATERAL       Einar Pheasant, MD

## 2020-05-30 NOTE — Patient Instructions (Signed)
Examples of probiotics:  Culturelle, florastor or align.   

## 2020-06-01 ENCOUNTER — Telehealth: Payer: Self-pay

## 2020-06-01 NOTE — Telephone Encounter (Signed)
Screening form for work completed and placed up front for pick up. Pt is aware.

## 2020-06-03 ENCOUNTER — Encounter: Payer: Self-pay | Admitting: Internal Medicine

## 2020-06-03 DIAGNOSIS — R928 Other abnormal and inconclusive findings on diagnostic imaging of breast: Secondary | ICD-10-CM | POA: Insufficient documentation

## 2020-06-03 NOTE — Assessment & Plan Note (Signed)
Seeing Dr Sherryll Burger.  On verapamil.  Has imitrex to take prn.  Works, but makes her queezy.  D/w neurology regarding further treatment options.

## 2020-06-03 NOTE — Assessment & Plan Note (Signed)
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  Consult oncology.  Overall poor prognosis daughter in agreement with keeping her DNR and have hospice follow at discharge 

## 2020-06-03 NOTE — Assessment & Plan Note (Signed)
On verapamil.  Blood pressures doing better.  Follow pressures.  Follow metabolic panel.

## 2020-06-03 NOTE — Assessment & Plan Note (Signed)
Increased stress.  Seeing psychiatry.  abilify recently added.  Continue f/u with psychiatry.

## 2020-06-03 NOTE — Assessment & Plan Note (Signed)
Low carb diet and exercise.  Continue on rybelsus and metformin.  Seeing endocrinology.  Last a1c improved as outlined.

## 2020-06-03 NOTE — Assessment & Plan Note (Signed)
Controlled.  On protonix.   

## 2020-06-03 NOTE — Assessment & Plan Note (Signed)
Last mammogram recommended f/u right breast ultrasound in 6 months.  Scheduled.

## 2020-06-03 NOTE — Assessment & Plan Note (Signed)
Followed by psychiatry.  abilify recently added.  Doing well.

## 2020-06-21 ENCOUNTER — Encounter: Payer: Self-pay | Admitting: Internal Medicine

## 2020-06-21 ENCOUNTER — Ambulatory Visit
Admission: RE | Admit: 2020-06-21 | Discharge: 2020-06-21 | Disposition: A | Discharge status: Home / Self Care | Payer: Managed Care, Other (non HMO) | Source: Ambulatory Visit | Attending: Internal Medicine | Admitting: Internal Medicine

## 2020-06-21 DIAGNOSIS — R928 Other abnormal and inconclusive findings on diagnostic imaging of breast: Secondary | ICD-10-CM | POA: Diagnosis not present

## 2020-06-21 DIAGNOSIS — M533 Sacrococcygeal disorders, not elsewhere classified: Secondary | ICD-10-CM

## 2020-06-21 DIAGNOSIS — M7918 Myalgia, other site: Secondary | ICD-10-CM

## 2020-07-02 NOTE — Telephone Encounter (Signed)
I reviewed the paperwork.  They are asking for specifics regarding time able to perform various activities, etc.  With these types of forms and in asking for specific functions she is able to do, I refer to physical therapy for an evaluation for work.  If agreeable, let me know and I will place the order.

## 2020-07-06 NOTE — Telephone Encounter (Signed)
Order placed for PT referral.  

## 2020-07-09 ENCOUNTER — Encounter: Payer: Self-pay | Admitting: Internal Medicine

## 2020-07-10 MED ORDER — PANTOPRAZOLE SODIUM 40 MG PO TBEC: 40.0000 mg | DELAYED_RELEASE_TABLET | ORAL | 1 refills | Status: DC

## 2020-07-13 ENCOUNTER — Other Ambulatory Visit: Payer: Self-pay

## 2020-07-13 ENCOUNTER — Ambulatory Visit (INDEPENDENT_AMBULATORY_CARE_PROVIDER_SITE_OTHER): Payer: Managed Care, Other (non HMO)

## 2020-07-13 DIAGNOSIS — Z3042 Encounter for surveillance of injectable contraceptive: Secondary | ICD-10-CM

## 2020-07-13 LAB — POCT URINE PREGNANCY: Preg Test, Ur: NEGATIVE

## 2020-07-13 MED ORDER — MEDROXYPROGESTERONE ACETATE 150 MG/ML IM SUSP
150.0000 mg | Freq: Once | INTRAMUSCULAR | Status: AC
Start: 2020-07-13 — End: 2020-07-13
  Administered 2020-07-13: 150 mg via INTRAMUSCULAR

## 2020-07-13 NOTE — Progress Notes (Signed)
Patient presents today for Depo Provera injection outside of date range. Patient not currently on menses. Pregnancy test performed per protocol (negative).Given IM RUOQ. Patient tolerated well. 

## 2020-07-16 ENCOUNTER — Encounter: Payer: Self-pay | Admitting: Internal Medicine

## 2020-07-24 NOTE — Telephone Encounter (Signed)
Forms dropped off and placed in colored folders in front office for Dr. Lorin Picket.

## 2020-07-25 NOTE — Telephone Encounter (Signed)
Form has been received and placed in providers Form Folder.

## 2020-07-26 ENCOUNTER — Other Ambulatory Visit: Payer: Self-pay | Admitting: Internal Medicine

## 2020-07-26 NOTE — Telephone Encounter (Signed)
Form signed and placed in box.   

## 2020-08-08 ENCOUNTER — Ambulatory Visit: Payer: Managed Care, Other (non HMO) | Attending: Internal Medicine | Admitting: Physical Therapy

## 2020-08-08 ENCOUNTER — Other Ambulatory Visit: Payer: Self-pay

## 2020-08-08 ENCOUNTER — Encounter: Payer: Self-pay | Admitting: Physical Therapy

## 2020-08-08 DIAGNOSIS — R293 Abnormal posture: Secondary | ICD-10-CM

## 2020-08-08 DIAGNOSIS — M533 Sacrococcygeal disorders, not elsewhere classified: Secondary | ICD-10-CM | POA: Diagnosis not present

## 2020-08-08 DIAGNOSIS — G8929 Other chronic pain: Secondary | ICD-10-CM | POA: Insufficient documentation

## 2020-08-08 DIAGNOSIS — M545 Low back pain, unspecified: Secondary | ICD-10-CM

## 2020-08-08 NOTE — Therapy (Signed)
[facility] [address], [place], [address] Phone: [phone]   Fax:  [phone]  Physical Therapy Evaluation  Patient Details  Name: ANDRYA ROPPOLO MRN: [id number] Date of Birth: 1981/08/07 No data recorded  Encounter Date: 08/08/2020   PT End of Session - 08/08/20 1616    Visit Number 1    Number of Visits 17    Date for PT Re-Evaluation 10/05/20    PT Start Time 0400    PT Stop Time 0500    PT Time Calculation (min) 60 min    Activity Tolerance Patient tolerated treatment well    Behavior During Therapy [facility] for tasks assessed/performed           Past Medical History:  Diagnosis Date  . Anemia   . Asthma    WELL CONTROLLED  . Complication of anesthesia    -TACYCARDIA AFTER GASTRIC SURGERY IN 2016 AND HAD TO STAY 5 DAYS-itching after surgery 2005, 2006  . Depression   . Diabetes mellitus (HCC)   . Fatty liver   . GERD (gastroesophageal reflux disease)   . Heart murmur   . History of dermoid cyst excision   . History of hiatal hernia   . Hypertension    H/O  . IBS (irritable bowel syndrome)   . Migraine   . PONV (postoperative nausea and vomiting)    nausea    Past Surgical History:  Procedure Laterality Date  . CHOLECYSTECTOMY  2005  . COLONOSCOPY WITH PROPOFOL N/A 01/23/2020   Procedure: COLONOSCOPY WITH PROPOFOL;  Surgeon: Midge Minium, MD;  Location: [facility] ENDOSCOPY;  Service: Endoscopy;  Laterality: N/A;  . DILATATION & CURETTAGE/HYSTEROSCOPY WITH MYOSURE N/A 03/30/2018   Procedure: DILATATION & CURETTAGE/HYSTEROSCOPY;  Surgeon: Natale Milch, MD;  Location: [facility] ORS;  Service: Gynecology;  Laterality: N/A;  . ESOPHAGOGASTRODUODENOSCOPY (EGD) WITH PROPOFOL N/A 01/23/2020   Procedure: ESOPHAGOGASTRODUODENOSCOPY (EGD) WITH PROPOFOL;  Surgeon: Midge Minium, MD;  Location: [facility] ENDOSCOPY;  Service: Endoscopy;  Laterality: N/A;  . GIVENS CAPSULE STUDY N/A 02/09/2020   Procedure:  GIVENS CAPSULE STUDY;  Surgeon: Midge Minium, MD;  Location: [facility] ENDOSCOPY;  Service: Endoscopy;  Laterality: N/A;  . LAPAROSCOPIC GASTRIC RESTRICTIVE DUODENAL PROCEDURE (DUODENAL SWITCH) N/A 08/28/2015   Procedure: LAPAROSCOPIC GASTRIC RESTRICTIVE DUODENAL PROCEDURE (DUODENAL SWITCH);  Surgeon: [name], MD;  Location: [facility] ORS;  Service: General;  Laterality: N/A;  . 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    Subjective Assessment - 08/08/20 1608    Pertinent History Pt is a 39 year old female presenting with chronic LBP with sacral pain. Reports she had weight loss surgery in Oct 2016 and has had tailbone pain since. 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 Reports sacral/LBP is more R sided, does not radiate, and denies numbness/tingling. Pain is dull ache, "feels like a bruise that she can touch", that is worse with sitting >1 hour at work or driving. Reports she has tried many seat cushions, different chairs, etc. But that they do not ease the pain. She tries to stand and walk around to help with pain, and it decreases it temporarily. Worst pain over the past week: 10/10 best: 0/10. Pt denies N/V, B&B changes, unexplained weight  fluctuation, saddle paresthesia, fever, night sweats, or unrelenting night pain at this time.    Limitations Lifting;Sitting    How long can you sit comfortably? >1hour    How long can you stand comfortably? unlimited    How long can you walk comfortably? unlimited    Diagnostic tests None    Patient Stated Goals decrease pain    Currently in Pain? Yes    Pain Score 8     Pain Location Sacrum    Pain Orientation Right;Left    Pain Descriptors / Indicators Aching;Dull    Pain Type Chronic pain    Pain Radiating Towards none    Pain Onset More than a month ago    Pain  Frequency Constant    Aggravating Factors  >1hour, driving    Pain Relieving Factors standing/walking    Effect of Pain on Daily Activities Unable to complete desk work duties without pain               OBJECTIVE  Mental Status Patient is oriented to person, place and time.  Recent memory is intact.  Remote memory is intact.  Attention span and concentration are intact.  Expressive speech is intact.  Patient's fund of knowledge is within normal limits for educational level.  SENSATION: Grossly intact to light touch bilateral LEs as determined by testing dermatomes L2-S2 Proprioception and hot/cold testing deferred on this date   MUSCULOSKELETAL: Tremor: None Bulk: Normal Tone: Normal No visible step-off along spinal column  Posture Lumbar lordosis: WNL Iliac crest height: equal bilaterally Lumbar lateral shift: negative Lower crossed syndrome (tight hip flexors and erector spinae; weak gluts and abs): positive In standing increased RLE wt bearing  Gait **Wide based gait = spinal stenosis (+LR 12)   Palpation  TTP over L SIJ (concordant pain sign) and trigger points over superior L glute musculature and lumbar paraspinals. Trigger points with tension to mid glute at level of piriformis  Strength (out of 5) R/L 5/5 Hip flexion 4+/4+* Hip ER 5/5 Hip IR 4-/4- Hip abduction 5/5 Hip adduction 3+/3+ Hip extension 5/5 Knee extension 5/5 Knee flexion 5/5 Ankle dorsiflexion 5/5 Ankle plantarflexion 5 Trunk flexion 5 Trunk extension 5/5 Trunk rotation  *Indicates pain   AROM (degrees) All hip and lumbar motions WNL *Indicates pain   PROM (degrees) PROM = AROM  Repeated Movements No centralization or peripheralization of symptoms with repeated lumbar extension or flexion.    Muscle Length Hamstrings: WNL bilat Ely: WNL bilat Thomas: WNL bilat Ober: WNL bilat   Passive Accessory Intervertebral Motion (PAIVM) Pt denies reproduction of back pain with  CPA L1-L5 and UPA bilaterally L1-L5. Generally hypomobile throughout  Passive Physiological Intervertebral Motion (PPIVM) Normal flexion and extension with PPIVM testing   SPECIAL TESTS Lumbar Radiculopathy and Discogenic: Centralization and Peripheralization (SN 92, -LR 0.12): Negative  Slump (SN 83, -LR 0.32):Negative bilat SLR (SN 92, -LR 0.29): Positive on LLE   Facet Joint: Extension-Rotation (SN 100, -LR 0.0): Negative bilat  Lumbar Spinal Stenosis: Lumbar quadrant (SN 70): Negative bilat  Hip: FABER (SN 81): Negative bilat FADIR (SN 94): R: Negative  L: Positive Hip scour (SN 50): Negative bilat  SIJ:  Thigh Thrust (SN 88, -LR 0.18) : R: Negative  L: Positive Distraction: Positive  Thigh Thrust Positive on L Compression: Positive on L; some discomfort on R   Piriformis Syndrome: FAIR Test (SN 88, SP 83): R: Negative  L: Positive  Functional Tasks STS decreased speed, normalized mechanics 5xSTS 13sec  Ther-Ex PT reviewed the following HEP with patient with patient able to demonstrate a set of the following with min cuing for correction needed. PT educated patient on parameters of therex (how/when to inc/decrease intensity, frequency, rep/set range, stretch hold time, and purpose of therex) with verbalized understanding. Education on neutral sitting posture with core engagement with good demonstrated and verbalized understanding  Access Code: [email or web address] Supine Bridge - 1 x daily - 7 x weekly - 2 sets - 10 reps Supine Posterior Pelvic Tilt - 2 x daily - 7 x weekly - 15 reps - 3sec hold Seated Figure 4 Piriformis Stretch - 3 x daily - 7 x weekly - 30-60sec hold Seated Child's Pose with Table - 3 x daily - 7 x weekly - 30-60 hold                                   Objective measurements completed on examination: See above findings.               PT Education - 08/08/20 1615    Education Details Patient was educated on diagnosis,  anatomy and pathology involved, prognosis, role of PT, and was given an HEP, demonstrating exercise with proper form following verbal and tactile cues, and was given a paper hand out to continue exercise at home. Pt was educated on and agreed to plan of care.    Person(s) Educated Patient    Methods Explanation;Demonstration;Tactile cues;Verbal cues;Handout    Comprehension Verbalized understanding;Returned demonstration;Verbal cues required;Tactile cues required            PT Short Term Goals - 08/08/20 1653      PT SHORT TERM GOAL #1   Title Pt will be independent with HEP in order to improve strength and decrease back pain in order to improve pain-free function at home and work.    Baseline 08/08/20 HEP given    Time 4    Period Weeks    Status New             PT Long Term Goals - 08/08/20 1651      PT LONG TERM GOAL #1   Title Pt will decrease 5TSTS by at least 3 seconds in order to demonstrate clinically significant improvement in LE strength    Baseline 08/08/20 13sec    Time 8    Period Weeks    Status New      PT LONG TERM GOAL #2   Title Patient will increase FOTO score to 74 to demonstrate predicted increase in functional mobility to complete ADLs    Baseline 08/08/20    Time 8    Period Weeks    Status New      PT LONG TERM GOAL #3   Title Pt will decrease worst back pain as reported on NPRS by at least 2 points in order to demonstrate clinically significant reduction in back pain.    Baseline 08/08/20 10/10    Time 8    Period Weeks    Status New                  Plan - 08/08/20 1654    Clinical Impression Statement Pt is a 39 year old female presenting with sacral/LBP consistent with L SIJ dysfunction. Impairments in decreased hip and core strength, postural abnormalities, increased muscle tension (lumbar paraspinals, and glutes), and pain. 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, educational Factors and Comorbidities Comorbidity 1;Comorbidity 2;Comorbidity 3+;Sex;Profession;Past/Current Experience;Fitness;Time since onset of injury/illness/exacerbation    Comorbidities HTN, DM2, obesity    Examination-Activity Limitations Squat;Lift;Bend;Sit;Transfers    Examination-Participation Restrictions Driving;Occupation    Stability/Clinical Decision Making Evolving/Moderate complexity    Clinical Decision Making Moderate    Rehab Potential Good    PT Frequency 3x / week    PT Duration 8 weeks    PT Treatment/Interventions ADLs/Self Care Home Management;Electrical Stimulation;Therapeutic activities;Patient/family education;Spinal Manipulations;Joint Manipulations;Dry needling;Manual techniques;Cryotherapy;Ultrasound;Functional mobility training;Neuromuscular re-education;Passive range of motion;Therapeutic exercise;DME Instruction;Iontophoresis 4mg /ml Dexamethasone;Moist Heat;Traction;Stair training;Gait training    PT Next Visit Plan HEP review, postural restoration, TDN?    PT Home Exercise Plan childs pose, piriformis stretch, post pelvic tilt, bridge    Consulted and Agree with Plan of Care Patient           Patient will benefit from skilled therapeutic intervention in order to improve the following deficits and impairments:  Decreased balance, Difficulty walking, Impaired flexibility, Obesity, Decreased activity tolerance, Decreased endurance, Decreased range of motion, Decreased strength, Decreased mobility, Decreased coordination, Increased muscle spasms, Postural dysfunction, Abnormal gait, Improper body mechanics, Pain  Visit Diagnosis: Sacrococcygeal disorders, not elsewhere classified  Chronic bilateral low back pain without sciatica  Abnormal posture     Problem List Patient Active Problem List   Diagnosis Date Noted  . Abnormal mammogram 06/03/2020  . Iron deficiency anemia 02/06/2020  . Polyp of descending colon   . Anemia  02/13/2019  . Herpes zoster 01/16/2019  . Type 2 diabetes mellitus without complication, without long-term current use of insulin (HCC) 10/17/2018  . Bloating 08/07/2018  . BMI 45.0-49.9, adult (HCC) 06/09/2018  . Epigastric pain 06/09/2018  . Status post hysteroscopy 03/30/2018  . Asthma exacerbation 09/16/2017  . Tooth abscess 05/29/2017  . Cough 01/31/2017  . Right shoulder pain 11/11/2016  . Headache 11/11/2016  . Sinusitis 08/31/2016  . Generalized anxiety disorder 07/04/2016  . Major depressive disorder, recurrent episode, moderate (HCC) 07/04/2016  . Dizziness 06/01/2016  . Fatigue 05/01/2016  . Sleep difficulties 02/12/2016  . Bariatric surgery status 08/28/2015  . Severe obesity (BMI >= 40) (HCC) 09/04/2014  . Stress 02/14/2014  . SOB (shortness of breath) 09/15/2013  . Environmental allergies 09/15/2013  . Microalbuminuria 07/27/2013  . Vitamin D deficiency 10/09/2012  . Hypertension 09/06/2012  . GERD (gastroesophageal reflux disease) 09/06/2012   09/08/2012 DPT Hilda Lias 08/08/2020, 4:58 PM  [facility] PH[facility] AND SPORTS MEDICINE [address], [place] Phone: [phone]   Fax:  [phone]  Name: LAVERNA DOSSETT MRN: [name] Date of Birth: 10/03/1981

## 2020-08-14 ENCOUNTER — Ambulatory Visit: Payer: Managed Care, Other (non HMO) | Admitting: Physical Therapy

## 2020-08-14 ENCOUNTER — Encounter: Payer: Self-pay | Admitting: Internal Medicine

## 2020-08-14 ENCOUNTER — Other Ambulatory Visit: Payer: Self-pay

## 2020-08-14 ENCOUNTER — Encounter: Payer: Self-pay | Admitting: Physical Therapy

## 2020-08-14 DIAGNOSIS — M533 Sacrococcygeal disorders, not elsewhere classified: Secondary | ICD-10-CM

## 2020-08-14 DIAGNOSIS — M545 Low back pain, unspecified: Secondary | ICD-10-CM

## 2020-08-14 DIAGNOSIS — R293 Abnormal posture: Secondary | ICD-10-CM

## 2020-08-14 DIAGNOSIS — G8929 Other chronic pain: Secondary | ICD-10-CM

## 2020-08-14 NOTE — Therapy (Signed)
[facility] [address], [place], [address] Phone: [phone]   Fax:  [phone]  Physical Therapy Treatment  Patient Details  Name: Shirley [name] MRN: [id number] Date of Birth: 1981-06-27 No data recorded  Encounter Date: 08/14/2020   PT End of Session - 08/14/20 1203    Visit Number 2    Number of Visits 17    Date for PT Re-Evaluation 10/05/20    PT Start Time 0945    PT Stop Time 1030    PT Time Calculation (min) 45 min    Activity Tolerance Patient tolerated treatment well    Behavior During Therapy [facility] for tasks assessed/performed           Past Medical History:  Diagnosis Date  . Anemia   . Asthma    WELL CONTROLLED  . Complication of anesthesia    -TACYCARDIA AFTER GASTRIC SURGERY IN 2016 AND HAD TO STAY 5 DAYS-itching after surgery 2005, 2006  . Depression   . Diabetes mellitus (HCC)   . Fatty liver   . GERD (gastroesophageal reflux disease)   . Heart murmur   . History of dermoid cyst excision   . History of hiatal hernia   . Hypertension    H/O  . IBS (irritable bowel syndrome)   . Migraine   . PONV (postoperative nausea and vomiting)    nausea    Past Surgical History:  Procedure Laterality Date  . CHOLECYSTECTOMY  2005  . COLONOSCOPY WITH PROPOFOL N/A 01/23/2020   Procedure: COLONOSCOPY WITH PROPOFOL;  Surgeon: Midge Minium, MD;  Location: [facility] ENDOSCOPY;  Service: Endoscopy;  Laterality: N/A;  . DILATATION & CURETTAGE/HYSTEROSCOPY WITH MYOSURE N/A 03/30/2018   Procedure: DILATATION & CURETTAGE/HYSTEROSCOPY;  Surgeon: Natale Milch, MD;  Location: [facility] ORS;  Service: Gynecology;  Laterality: N/A;  . ESOPHAGOGASTRODUODENOSCOPY (EGD) WITH PROPOFOL N/A 01/23/2020   Procedure: ESOPHAGOGASTRODUODENOSCOPY (EGD) WITH PROPOFOL;  Surgeon: Midge Minium, MD;  Location: [facility] ENDOSCOPY;  Service: Endoscopy;  Laterality: N/A;  . GIVENS CAPSULE STUDY N/A 02/09/2020   Procedure:  GIVENS CAPSULE STUDY;  Surgeon: Midge Minium, MD;  Location: [facility] ENDOSCOPY;  Service: Endoscopy;  Laterality: N/A;  . LAPAROSCOPIC GASTRIC RESTRICTIVE DUODENAL PROCEDURE (DUODENAL SWITCH) N/A 08/28/2015   Procedure: LAPAROSCOPIC GASTRIC RESTRICTIVE DUODENAL PROCEDURE (DUODENAL SWITCH);  Surgeon: [name], MD;  Location: [facility] ORS;  Service: General;  Laterality: N/A;  . 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   Subjective Assessment - 08/14/20 0951    Subjective Patient reports she did well over the weekend, reporting minimal pain. Reports yesterday her pain was brought on by sitting yesterday, reporting 8/10 pain today that is lingering. Compliance with HEP.Marland Kitchen    Pertinent History Pt is a 39 year old female presenting with chronic LBP with sacral pain. Reports she had weight loss surgery in Oct 2016 and has had tailbone pain since. 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 Reports sacral/LBP is more R sided, does not radiate, and denies numbness/tingling. Pain is dull ache, "feels like a bruise that she can touch", that is worse with sitting >1 hour at work or driving. Reports she has tried many seat cushions, different chairs, etc. But that they do not ease  the pain. She tries to stand and walk around to help with pain, and it decreases it temporarily. Worst pain over the past week: 10/10 best: 0/10. Pt denies N/V, B&B changes, unexplained weight fluctuation, saddle paresthesia, fever, night sweats, or unrelenting night pain at this time.    Limitations Lifting;Sitting    How long can you sit comfortably? >1hour    How long can you stand comfortably? unlimited    How long can you walk comfortably? unlimited    Diagnostic tests None    Patient Stated Goals decrease pain    Pain Onset More than a month ago           Ther-Ex Supine figure 4 stretch 30sec hold bilat Posterior pelvic tilit x12 3sec hold with TC needed for set up with good carry over  Bridge x10 with min cuing for full hip ext with good carry over; with add ball squeeze 2x 10 with min cuing to prevent rotation with good carry over TA marches from 6in step with heavy cuing for set up with good carry over following Deadbug theraball squeezes 3x 10 with fatigue from patient at final reps, good carry over following TC Qped alt hip ext x8 with heavy cuing for set up posture with neutral spine; Bird dog x8 with severe difficulty with core contraction and balance, able to correct somewhat Post <> ant pelvic tilt on theraball x12 with good carry over following demo and TC; verbalized understanding of sitting posture for work Post pelvic tilt on ball with alt march x12 with good carry over following demo                            PT Education - 08/14/20 1202    Education Details core activation, posture, therex form/technique    Person(s) Educated Patient    Methods Explanation;Demonstration;Verbal cues    Comprehension Verbalized understanding;Returned demonstration;Verbal cues required            PT Short Term Goals - 08/08/20 1653      PT SHORT TERM GOAL #1   Title Pt will be independent with HEP in order to improve strength and decrease back pain in order to improve pain-free function at home and work.    Baseline 08/08/20 HEP given    Time 4    Period Weeks    Status New             PT Long Term Goals - 08/08/20 1651      PT LONG TERM GOAL #1   Title Pt will decrease 5TSTS by at least 3 seconds in order to demonstrate clinically significant improvement in LE strength    Baseline 08/08/20 13sec    Time 8    Period Weeks    Status New      PT LONG TERM GOAL #2   Title Patient will increase FOTO score to 74 to demonstrate predicted increase in functional mobility to complete ADLs    Baseline  08/08/20    Time 8    Period Weeks    Status New      PT LONG TERM GOAL #3   Title Pt will decrease worst back pain as reported on NPRS by at least 2 points in order to demonstrate clinically significant reduction in back pain.    Baseline 08/08/20 10/10    Time 8    Period Weeks    Status New  Plan - 08/14/20 1212    Clinical Impression Statement PT initated therex for core and hip strengthening with postural carry over with success. Patient is able to comply with demo and multi-modal cuing for technique of therex and sitting posture and good motivation and no increased pain throughout session. PT will continue progression as able.    Personal Factors and Comorbidities Comorbidity 1;Comorbidity 2;Comorbidity 3+;Sex;Profession;Past/Current Experience;Fitness;Time since onset of injury/illness/exacerbation    Comorbidities HTN, DM2, obesity    Examination-Activity Limitations Squat;Lift;Bend;Sit;Transfers    Examination-Participation Restrictions Driving;Occupation    Stability/Clinical Decision Making Evolving/Moderate complexity    Clinical Decision Making Moderate    Rehab Potential Good    PT Frequency 3x / week    PT Duration 8 weeks    PT Treatment/Interventions ADLs/Self Care Home Management;Electrical Stimulation;Therapeutic activities;Patient/family education;Spinal Manipulations;Joint Manipulations;Dry needling;Manual techniques;Cryotherapy;Ultrasound;Functional mobility training;Neuromuscular re-education;Passive range of motion;Therapeutic exercise;DME Instruction;Iontophoresis 4mg /ml Dexamethasone;Moist Heat;Traction;Stair training;Gait training    PT Next Visit Plan HEP review, postural restoration, TDN?    PT Home Exercise Plan childs pose, piriformis stretch, post pelvic tilt, bridge    Consulted and Agree with Plan of Care Patient           Patient will benefit from skilled therapeutic intervention in order to improve the following deficits and  impairments:  Decreased balance, Difficulty walking, Impaired flexibility, Obesity, Decreased activity tolerance, Decreased endurance, Decreased range of motion, Decreased strength, Decreased mobility, Decreased coordination, Increased muscle spasms, Postural dysfunction, Abnormal gait, Improper body mechanics, Pain  Visit Diagnosis: Sacrococcygeal disorders, not elsewhere classified  Chronic bilateral low back pain without sciatica  Abnormal posture     Problem List Patient Active Problem List   Diagnosis Date Noted  . Abnormal mammogram 06/03/2020  . Iron deficiency anemia 02/06/2020  . Polyp of descending colon   . Anemia 02/13/2019  . Herpes zoster 01/16/2019  . Type 2 diabetes mellitus without complication, without long-term current use of insulin (HCC) 10/17/2018  . Bloating 08/07/2018  . BMI 45.0-49.9, adult (HCC) 06/09/2018  . Epigastric pain 06/09/2018  . Status post hysteroscopy 03/30/2018  . Asthma exacerbation 09/16/2017  . Tooth abscess 05/29/2017  . Cough 01/31/2017  . Right shoulder pain 11/11/2016  . Headache 11/11/2016  . Sinusitis 08/31/2016  . Generalized anxiety disorder 07/04/2016  . Major depressive disorder, recurrent episode, moderate (HCC) 07/04/2016  . Dizziness 06/01/2016  . Fatigue 05/01/2016  . Sleep difficulties 02/12/2016  . Bariatric surgery status 08/28/2015  . Severe obesity (BMI >= 40) (HCC) 09/04/2014  . Stress 02/14/2014  . SOB (shortness of breath) 09/15/2013  . Environmental allergies 09/15/2013  . Microalbuminuria 07/27/2013  . Vitamin D deficiency 10/09/2012  . Hypertension 09/06/2012  . GERD (gastroesophageal reflux disease) 09/06/2012   09/08/2012 DPT Hilda Lias 08/14/2020, 2:21 PM   [facility] PHYSICAL AND SPORTS MEDICINE [address], [place] Phone: [phone]   Fax:  [phone]  Name: Shirley [name] MRN: [name] Date of Birth: August 11, 1981

## 2020-08-17 ENCOUNTER — Ambulatory Visit: Payer: Managed Care, Other (non HMO) | Admitting: Physical Therapy

## 2020-08-20 ENCOUNTER — Ambulatory Visit: Payer: Managed Care, Other (non HMO)

## 2020-08-20 ENCOUNTER — Other Ambulatory Visit: Payer: Self-pay

## 2020-08-20 ENCOUNTER — Encounter: Payer: Self-pay | Admitting: Physical Therapy

## 2020-08-20 DIAGNOSIS — M533 Sacrococcygeal disorders, not elsewhere classified: Secondary | ICD-10-CM | POA: Diagnosis not present

## 2020-08-20 DIAGNOSIS — G8929 Other chronic pain: Secondary | ICD-10-CM

## 2020-08-20 DIAGNOSIS — R293 Abnormal posture: Secondary | ICD-10-CM

## 2020-08-20 DIAGNOSIS — M545 Low back pain, unspecified: Secondary | ICD-10-CM

## 2020-08-20 NOTE — Therapy (Signed)
[facility]ICINE [address] [place], [address] Phone: [phone]   Fax:  [phone]  Physical Therapy Treatment  Patient Details  Name: Shirley [name] MRN: [id number] Date of Birth: [birth year] No data recorded  Encounter Date: 08/20/2020   PT End of Session - 08/20/20 1137    Visit Number 3    Number of Visits 17    Date for PT Re-Evaluation 10/05/20    PT Start Time 0900    PT Stop Time 0945    PT Time Calculation (min) 45 min    Activity Tolerance Patient tolerated treatment well    Behavior During Therapy [facility] for tasks assessed/performed           Past Medical History:  Diagnosis Date  . Anemia   . Asthma    WELL CONTROLLED  . Complication of anesthesia    -TACYCARDIA AFTER GASTRIC SURGERY IN 2016 AND HAD TO STAY 5 DAYS-itching after surgery 2005, 2006  . Depression   . Diabetes mellitus (HCC)   . Fatty liver   . GERD (gastroesophageal reflux disease)   . Heart murmur   . History of dermoid cyst excision   . History of hiatal hernia   . Hypertension    H/O  . IBS (irritable bowel syndrome)   . Migraine   . PONV (postoperative nausea and vomiting)    nausea    Past Surgical History:  Procedure Laterality Date  . CHOLECYSTECTOMY  2005  . COLONOSCOPY WITH PROPOFOL N/A 01/23/2020   Procedure: COLONOSCOPY WITH PROPOFOL;  Surgeon: Midge Minium, MD;  Location: [facility] ENDOSCOPY;  Service: Endoscopy;  Laterality: N/A;  . DILATATION & CURETTAGE/HYSTEROSCOPY WITH MYOSURE N/A 03/30/2018   Procedure: DILATATION & CURETTAGE/HYSTEROSCOPY;  Surgeon: Natale Milch, MD;  Location: [facility] ORS;  Service: Gynecology;  Laterality: N/A;  . ESOPHAGOGASTRODUODENOSCOPY (EGD) WITH PROPOFOL N/A 01/23/2020   Procedure: ESOPHAGOGASTRODUODENOSCOPY (EGD) WITH PROPOFOL;  Surgeon: Midge Minium, MD;  Location: [facility] ENDOSCOPY;  Service: Endoscopy;  Laterality: N/A;  . GIVENS CAPSULE STUDY N/A 02/09/2020   Procedure:  GIVENS CAPSULE STUDY;  Surgeon: Midge Minium, MD;  Location: [facility] ENDOSCOPY;  Service: Endoscopy;  Laterality: N/A;  . LAPAROSCOPIC GASTRIC RESTRICTIVE DUODENAL PROCEDURE (DUODENAL SWITCH) N/A 08/28/2015   Procedure: LAPAROSCOPIC GASTRIC RESTRICTIVE DUODENAL PROCEDURE (DUODENAL SWITCH);  Surgeon: [name], MD;  Location: [facility] ORS;  Service: General;  Laterality: N/A;  . 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   Subjective Assessment - 08/20/20 1135    Subjective Pt states her employer is helping her get a standing workstation desk.  She arrives today with c/o soreness across bilateral lower back.  She reports she is working on her HEP.    Pertinent History Pt is a 39 year old female presenting with chronic LBP with sacral pain. Reports she had weight loss surgery in Oct 2016 and has had tailbone pain since. 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 Reports sacral/LBP is more R sided, does not radiate, and denies numbness/tingling. Pain is dull ache, "feels like a bruise that she can touch", that is worse with sitting >1 hour at work or driving. Reports she has tried many seat cushions, different chairs, etc. But that they do not  ease the pain. She tries to stand and walk around to help with pain, and it decreases it temporarily. Worst pain over the past week: 10/10 best: 0/10. Pt denies N/V, B&B changes, unexplained weight fluctuation, saddle paresthesia, fever, night sweats, or unrelenting night pain at this time.    Limitations Lifting;Sitting    How long can you sit comfortably? >1hour    How long can you stand comfortably? unlimited    How long can you walk comfortably? unlimited    Diagnostic tests None    Patient Stated Goals decrease pain    Currently in Pain? Yes    Pain Location Sacrum    Pain Onset  More than a month ago               Ther-Ex Supine figure 4 stretch 30sec hold bilat Posterior pelvic tilit x12 3sec hold with TC  Bridge x10 with min cuing for full hip ext with good carry over; with add ball squeeze 2x 10 with min cuing to prevent rotation with good carry over TA marches from 6in step with heavy cuing for set up with good carry over following Deadbug theraball squeezes 3x 10 with fatigue from patient at final reps, good carry over following TC Qped alt hip ext x8 with heavy cuing for set up posture with neutral spine; Bird dog x8 with severe difficulty with core contraction and balance, able to correct somewhat Post <> ant pelvic tilt on theraball x12 with good carry over following demo and TC; verbalized understanding of sitting posture for work TA bracing with alt marches sitting on theraball 2x10 TA bracing sitting on theraball x10, with UE lift (holding green ball) x20        PT Education - 08/20/20 1136    Education Details therex form and technique    Person(s) Educated Patient    Methods Explanation;Tactile cues;Verbal cues    Comprehension Verbalized understanding;Need further instruction;Returned demonstration            PT Short Term Goals - 08/20/20 1152      PT SHORT TERM GOAL #1   Title Pt will be independent with HEP in order to improve strength and decrease back pain in order to improve pain-free function at home and work.    Baseline 08/08/20 HEP given    Time 4    Period Weeks    Status New             PT Long Term Goals - 08/20/20 1153      PT LONG TERM GOAL #1   Title Pt will decrease 5TSTS by at least 3 seconds in order to demonstrate clinically significant improvement in LE strength    Baseline 08/08/20 13sec    Time 8    Period Weeks    Status New      PT LONG TERM GOAL #2   Title Patient will increase FOTO score to 74 to demonstrate predicted increase in functional mobility to complete ADLs    Baseline 08/08/20    Time  8    Period Weeks    Status New      PT LONG TERM GOAL #3   Title Pt will decrease worst back pain as reported on NPRS by at least 2 points in order to demonstrate clinically significant reduction in back pain.    Baseline 08/08/20 10/10    Time 8    Period Weeks    Status New  Plan - 08/20/20 1138    Clinical Impression Statement Pt able to perform core and hip strengthening without c/o increased pain during or at end of session.  She has difficulty achieving neutral posture during core strengthening and also requires frequent cues for pelvic tilt technique.  Overall, she should continue to benefit from skilled PT for motor control retraining for core mm.    Personal Factors and Comorbidities Comorbidity 1;Comorbidity 2;Comorbidity 3+;Sex;Profession;Past/Current Experience;Fitness;Time since onset of injury/illness/exacerbation    Comorbidities HTN, DM2, obesity    Examination-Activity Limitations Squat;Lift;Bend;Sit;Transfers    Examination-Participation Restrictions Driving;Occupation    Stability/Clinical Decision Making Evolving/Moderate complexity    Rehab Potential Good    PT Frequency 3x / week    PT Duration 8 weeks    PT Treatment/Interventions ADLs/Self Care Home Management;Electrical Stimulation;Therapeutic activities;Patient/family education;Spinal Manipulations;Joint Manipulations;Dry needling;Manual techniques;Cryotherapy;Ultrasound;Functional mobility training;Neuromuscular re-education;Passive range of motion;Therapeutic exercise;DME Instruction;Iontophoresis 4mg /ml Dexamethasone;Moist Heat;Traction;Stair training;Gait training    PT Next Visit Plan HEP review, postural restoration, TDN?    PT Home Exercise Plan childs pose, piriformis stretch, post pelvic tilt, bridge    Consulted and Agree with Plan of Care Patient           Patient will benefit from skilled therapeutic intervention in order to improve the following deficits and impairments:   Decreased balance, Difficulty walking, Impaired flexibility, Obesity, Decreased activity tolerance, Decreased endurance, Decreased range of motion, Decreased strength, Decreased mobility, Decreased coordination, Increased muscle spasms, Postural dysfunction, Abnormal gait, Improper body mechanics, Pain  Visit Diagnosis: Sacrococcygeal disorders, not elsewhere classified  Chronic bilateral low back pain without sciatica  Abnormal posture     Problem List Patient Active Problem List   Diagnosis Date Noted  . Abnormal mammogram 06/03/2020  . Iron deficiency anemia 02/06/2020  . Polyp of descending colon   . Anemia 02/13/2019  . Herpes zoster 01/16/2019  . Type 2 diabetes mellitus without complication, without long-term current use of insulin (HCC) 10/17/2018  . Bloating 08/07/2018  . BMI 45.0-49.9, adult (HCC) 06/09/2018  . Epigastric pain 06/09/2018  . Status post hysteroscopy 03/30/2018  . Asthma exacerbation 09/16/2017  . Tooth abscess 05/29/2017  . Cough 01/31/2017  . Right shoulder pain 11/11/2016  . Headache 11/11/2016  . Sinusitis 08/31/2016  . Generalized anxiety disorder 07/04/2016  . Major depressive disorder, recurrent episode, moderate (HCC) 07/04/2016  . Dizziness 06/01/2016  . Fatigue 05/01/2016  . Sleep difficulties 02/12/2016  . Bariatric surgery status 08/28/2015  . Severe obesity (BMI >= 40) (HCC) 09/04/2014  . Stress 02/14/2014  . SOB (shortness of breath) 09/15/2013  . Environmental allergies 09/15/2013  . Microalbuminuria 07/27/2013  . Vitamin D deficiency 10/09/2012  . Hypertension 09/06/2012  . GERD (gastroesophageal reflux disease) 09/06/2012    09/08/2012 08/20/2020, 11:59 AM 08/22/2020, PT, DPT Physical Therapist - [place]   [place] [facility] PHYSICAL AND SPORTS MEDICINE [address], [place] Phone: [phone]   Fax:  [phone]  Name: TANZIE ROTHSCHILD MRN:  [name] Date of Birth: Jun 30, 1981

## 2020-08-22 ENCOUNTER — Ambulatory Visit: Payer: Managed Care, Other (non HMO) | Admitting: Physical Therapy

## 2020-08-27 ENCOUNTER — Encounter: Payer: Self-pay | Admitting: Physical Therapy

## 2020-08-27 ENCOUNTER — Ambulatory Visit: Payer: Managed Care, Other (non HMO) | Attending: Internal Medicine | Admitting: Physical Therapy

## 2020-08-27 ENCOUNTER — Other Ambulatory Visit: Payer: Self-pay

## 2020-08-27 DIAGNOSIS — G8929 Other chronic pain: Secondary | ICD-10-CM | POA: Insufficient documentation

## 2020-08-27 DIAGNOSIS — M533 Sacrococcygeal disorders, not elsewhere classified: Secondary | ICD-10-CM | POA: Diagnosis not present

## 2020-08-27 DIAGNOSIS — R293 Abnormal posture: Secondary | ICD-10-CM | POA: Diagnosis present

## 2020-08-27 DIAGNOSIS — M545 Low back pain, unspecified: Secondary | ICD-10-CM | POA: Diagnosis present

## 2020-08-27 NOTE — Therapy (Signed)
[facility] PHYS[facility]ORTS MEDICINE [address], [place], [address] Phone: [phone]   Fax:  [phone]  Physical Therapy Treatment  Patient Details  Name: Shirley [name] MRN: [id number] Date of Birth: [birth year] No data recorded  Encounter Date: 08/27/2020   PT End of Session - 08/27/20 0828    Visit Number 4    Number of Visits 17    Date for PT Re-Evaluation 10/05/20    PT Start Time 0821    PT Stop Time 0900    PT Time Calculation (min) 39 min    Activity Tolerance Patient tolerated treatment well    Behavior During Therapy [facility] for tasks assessed/performed           Past Medical History:  Diagnosis Date  . Anemia   . Asthma    WELL CONTROLLED  . Complication of anesthesia    -TACYCARDIA AFTER GASTRIC SURGERY IN 2016 AND HAD TO STAY 5 DAYS-itching after surgery 2005, 2006  . Depression   . Diabetes mellitus (HCC)   . Fatty liver   . GERD (gastroesophageal reflux disease)   . Heart murmur   . History of dermoid cyst excision   . History of hiatal hernia   . Hypertension    H/O  . IBS (irritable bowel syndrome)   . Migraine   . PONV (postoperative nausea and vomiting)    nausea    Past Surgical History:  Procedure Laterality Date  . CHOLECYSTECTOMY  2005  . COLONOSCOPY WITH PROPOFOL N/A 01/23/2020   Procedure: COLONOSCOPY WITH PROPOFOL;  Surgeon: Midge Minium, MD;  Location: [facility] ENDOSCOPY;  Service: Endoscopy;  Laterality: N/A;  . DILATATION & CURETTAGE/HYSTEROSCOPY WITH MYOSURE N/A 03/30/2018   Procedure: DILATATION & CURETTAGE/HYSTEROSCOPY;  Surgeon: Natale Milch, MD;  Location: [facility] ORS;  Service: Gynecology;  Laterality: N/A;  . ESOPHAGOGASTRODUODENOSCOPY (EGD) WITH PROPOFOL N/A 01/23/2020   Procedure: ESOPHAGOGASTRODUODENOSCOPY (EGD) WITH PROPOFOL;  Surgeon: Midge Minium, MD;  Location: [facility] ENDOSCOPY;  Service: Endoscopy;  Laterality: N/A;  . GIVENS CAPSULE STUDY N/A 02/09/2020   Procedure:  GIVENS CAPSULE STUDY;  Surgeon: Midge Minium, MD;  Location: [facility] ENDOSCOPY;  Service: Endoscopy;  Laterality: N/A;  . LAPAROSCOPIC GASTRIC RESTRICTIVE DUODENAL PROCEDURE (DUODENAL SWITCH) N/A 08/28/2015   Procedure: LAPAROSCOPIC GASTRIC RESTRICTIVE DUODENAL PROCEDURE (DUODENAL SWITCH);  Surgeon: [name], MD;  Location: [facility] ORS;  Service: General;  Laterality: N/A;  . 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   Subjective Assessment - 08/27/20 0824    Subjective Pt reports her standing desk was approved, which she is pleased with. Reports less pain overall and reports no pain this morning. Patient reports being able to sit at work for a couple hours without pain. Reports she thinks bridge exercise is helping    Pertinent History Pt is a 39 year old female presenting with chronic LBP with sacral pain. Reports she had weight loss surgery in Oct 2016 and has had tailbone pain since. 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 Reports sacral/LBP is more R sided, does not radiate, and denies numbness/tingling. Pain is dull ache, "feels like a bruise that she can touch", that is worse with sitting >1 hour at work or driving. Reports she has tried  many seat cushions, different chairs, etc. But that they do not ease the pain. She tries to stand and walk around to help with pain, and it decreases it temporarily. Worst pain over the past week: 10/10 best: 0/10. Pt denies N/V, B&B changes, unexplained weight fluctuation, saddle paresthesia, fever, night sweats, or unrelenting night pain at this time.    Limitations Lifting;Sitting    How long can you sit comfortably? >1hour    How long can you stand comfortably? unlimited    How long can you walk comfortably? unlimited    Diagnostic tests None    Patient Stated Goals decrease  pain    Pain Onset More than a month ago           Ther-Ex Nustep L4 seat 7 UEs 7 for gentle strengthening, and lumbar rotation -Sitting figure 4 stretch 30sec hold bilat -Bridge x10; SL bridge 2x 6 each LE with cuing for full hip ext with good carry over, min lift with SL Deadbug theraball squeezes 2x 10 with fatigue from patient at final reps, good carry over following TC Alt deadbug with theraball 2x 10/8 with fatigue at final reps, good carry over of cuing.  TA bracing with alt marches sitting on theraball 2x10 Theraball oblique twists 2x 12 with good carry over of cuing Mini squat with 5# DB BUE flex for increased core demand 2x 10 with good carry over of cuing for proper technique Modified seated childs pose with theraball                             PT Education - 08/27/20 0826    Education Details therex form/technique    Person(s) Educated Patient    Methods Explanation;Demonstration;Verbal cues    Comprehension Verbalized understanding;Returned demonstration;Verbal cues required            PT Short Term Goals - 08/20/20 1152      PT SHORT TERM GOAL #1   Title Pt will be independent with HEP in order to improve strength and decrease back pain in order to improve pain-free function at home and work.    Baseline 08/08/20 HEP given    Time 4    Period Weeks    Status New             PT Long Term Goals - 08/20/20 1153      PT LONG TERM GOAL #1   Title Pt will decrease 5TSTS by at least 3 seconds in order to demonstrate clinically significant improvement in LE strength    Baseline 08/08/20 13sec    Time 8    Period Weeks    Status New      PT LONG TERM GOAL #2   Title Patient will increase FOTO score to 74 to demonstrate predicted increase in functional mobility to complete ADLs    Baseline 08/08/20    Time 8    Period Weeks    Status New      PT LONG TERM GOAL #3   Title Pt will decrease worst back pain as reported on NPRS  by at least 2 points in order to demonstrate clinically significant reduction in back pain.    Baseline 08/08/20 10/10    Time 8    Period Weeks    Status New                 Plan - 08/27/20 4970    Clinical  Impression Statement PT continued therex progression for increased core/hip strength with success. Patient is able to comply with all provided cuing and demonstrations with good carry over and no increased pain. Patient is reporting less overall pain, and increased sitting time with correct posture tolerance. PT will continue progression as able.    Personal Factors and Comorbidities Comorbidity 1;Comorbidity 2;Comorbidity 3+;Sex;Profession;Past/Current Experience;Fitness;Time since onset of injury/illness/exacerbation    Examination-Activity Limitations Squat;Lift;Bend;Sit;Transfers    Stability/Clinical Decision Making Evolving/Moderate complexity    Clinical Decision Making Moderate    Rehab Potential Good    PT Frequency 3x / week    PT Duration 8 weeks    PT Treatment/Interventions ADLs/Self Care Home Management;Electrical Stimulation;Therapeutic activities;Patient/family education;Spinal Manipulations;Joint Manipulations;Dry needling;Manual techniques;Cryotherapy;Ultrasound;Functional mobility training;Neuromuscular re-education;Passive range of motion;Therapeutic exercise;DME Instruction;Iontophoresis 4mg /ml Dexamethasone;Moist Heat;Traction;Stair training;Gait training    PT Next Visit Plan HEP review, postural restoration,    PT Home Exercise Plan childs pose, piriformis stretch, post pelvic tilt, bridge    Consulted and Agree with Plan of Care Patient           Patient will benefit from skilled therapeutic intervention in order to improve the following deficits and impairments:  Decreased balance, Difficulty walking, Impaired flexibility, Obesity, Decreased activity tolerance, Decreased endurance, Decreased range of motion, Decreased strength, Decreased mobility,  Decreased coordination, Increased muscle spasms, Postural dysfunction, Abnormal gait, Improper body mechanics, Pain  Visit Diagnosis: Sacrococcygeal disorders, not elsewhere classified  Chronic bilateral low back pain without sciatica  Abnormal posture     Problem List Patient Active Problem List   Diagnosis Date Noted  . Abnormal mammogram 06/03/2020  . Iron deficiency anemia 02/06/2020  . Polyp of descending colon   . Anemia 02/13/2019  . Herpes zoster 01/16/2019  . Type 2 diabetes mellitus without complication, without long-term current use of insulin (HCC) 10/17/2018  . Bloating 08/07/2018  . BMI 45.0-49.9, adult (HCC) 06/09/2018  . Epigastric pain 06/09/2018  . Status post hysteroscopy 03/30/2018  . Asthma exacerbation 09/16/2017  . Tooth abscess 05/29/2017  . Cough 01/31/2017  . Right shoulder pain 11/11/2016  . Headache 11/11/2016  . Sinusitis 08/31/2016  . Generalized anxiety disorder 07/04/2016  . Major depressive disorder, recurrent episode, moderate (HCC) 07/04/2016  . Dizziness 06/01/2016  . Fatigue 05/01/2016  . Sleep difficulties 02/12/2016  . Bariatric surgery status 08/28/2015  . Severe obesity (BMI >= 40) (HCC) 09/04/2014  . Stress 02/14/2014  . SOB (shortness of breath) 09/15/2013  . Environmental allergies 09/15/2013  . Microalbuminuria 07/27/2013  . Vitamin D deficiency 10/09/2012  . Hypertension 09/06/2012  . GERD (gastroesophageal reflux disease) 09/06/2012   09/08/2012 DPT Hilda Lias 08/27/2020, 8:54 AM  [place] [facility] PHYSICAL AND SPORTS MEDICINE [address], [place] Phone: [phone]   Fax:  [phone]  Name: JYLLIAN HAYNIE MRN: [name] Date of Birth: 1981-05-07

## 2020-08-29 ENCOUNTER — Other Ambulatory Visit: Payer: Self-pay

## 2020-08-29 ENCOUNTER — Encounter: Payer: Self-pay | Admitting: Physical Therapy

## 2020-08-29 ENCOUNTER — Ambulatory Visit: Payer: Managed Care, Other (non HMO) | Admitting: Physical Therapy

## 2020-08-29 DIAGNOSIS — M533 Sacrococcygeal disorders, not elsewhere classified: Secondary | ICD-10-CM

## 2020-08-29 DIAGNOSIS — M545 Low back pain, unspecified: Secondary | ICD-10-CM

## 2020-08-29 DIAGNOSIS — R293 Abnormal posture: Secondary | ICD-10-CM

## 2020-08-29 DIAGNOSIS — G8929 Other chronic pain: Secondary | ICD-10-CM

## 2020-08-29 NOTE — Therapy (Signed)
[facility] [address], [place], [address] Phone: [phone]   Fax:  [phone]  Physical Therapy Treatment  Patient Details  Name: Shirley [name] MRN: [id number] Date of Birth: 1981/08/08 No data recorded  Encounter Date: 08/29/2020   PT End of Session - 08/29/20 1121    Visit Number 5    Number of Visits 17    Date for PT Re-Evaluation 10/05/20    PT Start Time 1117    PT Stop Time 1155    PT Time Calculation (min) 38 min    Activity Tolerance Patient tolerated treatment well    Behavior During Therapy [facility] for tasks assessed/performed           Past Medical History:  Diagnosis Date  . Anemia   . Asthma    WELL CONTROLLED  . Complication of anesthesia    -TACYCARDIA AFTER GASTRIC SURGERY IN 2016 AND HAD TO STAY 5 DAYS-itching after surgery 2005, 2006  . Depression   . Diabetes mellitus (HCC)   . Fatty liver   . GERD (gastroesophageal reflux disease)   . Heart murmur   . History of dermoid cyst excision   . History of hiatal hernia   . Hypertension    H/O  . IBS (irritable bowel syndrome)   . Migraine   . PONV (postoperative nausea and vomiting)    nausea    Past Surgical History:  Procedure Laterality Date  . CHOLECYSTECTOMY  2005  . COLONOSCOPY WITH PROPOFOL N/A 01/23/2020   Procedure: COLONOSCOPY WITH PROPOFOL;  Surgeon: Midge Minium, MD;  Location: [facility] ENDOSCOPY;  Service: Endoscopy;  Laterality: N/A;  . DILATATION & CURETTAGE/HYSTEROSCOPY WITH MYOSURE N/A 03/30/2018   Procedure: DILATATION & CURETTAGE/HYSTEROSCOPY;  Surgeon: Natale Milch, MD;  Location: [facility] ORS;  Service: Gynecology;  Laterality: N/A;  . ESOPHAGOGASTRODUODENOSCOPY (EGD) WITH PROPOFOL N/A 01/23/2020   Procedure: ESOPHAGOGASTRODUODENOSCOPY (EGD) WITH PROPOFOL;  Surgeon: Midge Minium, MD;  Location: [facility] ENDOSCOPY;  Service: Endoscopy;  Laterality: N/A;  . GIVENS CAPSULE STUDY N/A 02/09/2020   Procedure:  GIVENS CAPSULE STUDY;  Surgeon: Midge Minium, MD;  Location: [facility] ENDOSCOPY;  Service: Endoscopy;  Laterality: N/A;  . LAPAROSCOPIC GASTRIC RESTRICTIVE DUODENAL PROCEDURE (DUODENAL SWITCH) N/A 08/28/2015   Procedure: LAPAROSCOPIC GASTRIC RESTRICTIVE DUODENAL PROCEDURE (DUODENAL SWITCH);  Surgeon: [name], MD;  Location: [facility] ORS;  Service: General;  Laterality: N/A;  . 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   Subjective Assessment - 08/29/20 1117    Subjective Patient reports no pain today, it feeling better overall. Reports she feels 50% better overall, still having pain.    Pertinent History Pt is a 39 year old female presenting with chronic LBP with sacral pain. Reports she had weight loss surgery in Oct 2016 and has had tailbone pain since. 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 Reports sacral/LBP is more R sided, does not radiate, and denies numbness/tingling. Pain is dull ache, "feels like a bruise that she can touch", that is worse with sitting >1 hour at work or driving. Reports she has tried many seat cushions, different chairs, etc. But that they do not ease the pain. She tries to stand and walk around to help with pain,  and it decreases it temporarily. Worst pain over the past week: 10/10 best: 0/10. Pt denies N/V, B&B changes, unexplained weight fluctuation, saddle paresthesia, fever, night sweats, or unrelenting night pain at this time.    Limitations Lifting;Sitting    How long can you sit comfortably? >1hour    How long can you stand comfortably? unlimited    How long can you walk comfortably? unlimited    Diagnostic tests None    Patient Stated Goals decrease pain    Pain Onset More than a month ago           Ther-Ex Nustep L4 seat 7 UEs 7 for gentle strengthening, and lumbar  rotation -Sitting figure 4 stretch 30sec hold bilat -Bridge from bosu hardside x10; SL bridge from bosu hardside 2x 5 each LE with cuing for full hip ext with good carry over, min lift with SL Alt deadbug with theraball 3x 8 with fatigue at final reps, good carry over of cuing for lumbar contact to mat table Alt marching on theraball with BUE flex 2x 12 with min cuing for set up with good carry over Theraball oblique twists 5# DB 2x 12 with good carry over of cuing Slider abd in mini squat BUE flex x10 bilat; with 5# DB BUE flex 2x 10 bilat with min cuing and demo for technique with good carry over Modified seated childs pose with theraball; lateral each direction                           PT Education - 08/29/20 1120    Education Details therex form/technique    Person(s) Educated Patient    Methods Explanation;Demonstration;Verbal cues    Comprehension Verbalized understanding;Returned demonstration;Verbal cues required            PT Short Term Goals - 08/20/20 1152      PT SHORT TERM GOAL #1   Title Pt will be independent with HEP in order to improve strength and decrease back pain in order to improve pain-free function at home and work.    Baseline 08/08/20 HEP given    Time 4    Period Weeks    Status New             PT Long Term Goals - 08/20/20 1153      PT LONG TERM GOAL #1   Title Pt will decrease 5TSTS by at least 3 seconds in order to demonstrate clinically significant improvement in LE strength    Baseline 08/08/20 13sec    Time 8    Period Weeks    Status New      PT LONG TERM GOAL #2   Title Patient will increase FOTO score to 74 to demonstrate predicted increase in functional mobility to complete ADLs    Baseline 08/08/20    Time 8    Period Weeks    Status New      PT LONG TERM GOAL #3   Title Pt will decrease worst back pain as reported on NPRS by at least 2 points in order to demonstrate clinically significant  reduction in back pain.    Baseline 08/08/20 10/10    Time 8    Period Weeks    Status New                 Plan - 08/29/20 1140    Clinical Impression Statement PT continued therex progression for core/hip strength and stability  with success. Patien tis able to comply with all multimodal cuing for proper technique    Personal Factors and Comorbidities Comorbidity 1;Comorbidity 2;Comorbidity 3+;Sex;Profession;Past/Current Experience;Fitness;Time since onset of injury/illness/exacerbation    Comorbidities HTN, DM2, obesity    Examination-Activity Limitations Squat;Lift;Bend;Sit;Transfers    Examination-Participation Restrictions Driving;Occupation    Stability/Clinical Decision Making Evolving/Moderate complexity    Clinical Decision Making Moderate    Rehab Potential Good    PT Frequency 3x / week    PT Duration 8 weeks    PT Treatment/Interventions ADLs/Self Care Home Management;Electrical Stimulation;Therapeutic activities;Patient/family education;Spinal Manipulations;Joint Manipulations;Dry needling;Manual techniques;Cryotherapy;Ultrasound;Functional mobility training;Neuromuscular re-education;Passive range of motion;Therapeutic exercise;DME Instruction;Iontophoresis 4mg /ml Dexamethasone;Moist Heat;Traction;Stair training;Gait training    PT Next Visit Plan HEP review, postural restoration,    PT Home Exercise Plan childs pose, piriformis stretch, post pelvic tilt, bridge    Consulted and Agree with Plan of Care Patient           Patient will benefit from skilled therapeutic intervention in order to improve the following deficits and impairments:  Decreased balance, Difficulty walking, Impaired flexibility, Obesity, Decreased activity tolerance, Decreased endurance, Decreased range of motion, Decreased strength, Decreased mobility, Decreased coordination, Increased muscle spasms, Postural dysfunction, Abnormal gait, Improper body mechanics, Pain  Visit  Diagnosis: Sacrococcygeal disorders, not elsewhere classified  Chronic bilateral low back pain without sciatica  Abnormal posture     Problem List Patient Active Problem List   Diagnosis Date Noted  . Abnormal mammogram 06/03/2020  . Iron deficiency anemia 02/06/2020  . Polyp of descending colon   . Anemia 02/13/2019  . Herpes zoster 01/16/2019  . Type 2 diabetes mellitus without complication, without long-term current use of insulin (HCC) 10/17/2018  . Bloating 08/07/2018  . BMI 45.0-49.9, adult (HCC) 06/09/2018  . Epigastric pain 06/09/2018  . Status post hysteroscopy 03/30/2018  . Asthma exacerbation 09/16/2017  . Tooth abscess 05/29/2017  . Cough 01/31/2017  . Right shoulder pain 11/11/2016  . Headache 11/11/2016  . Sinusitis 08/31/2016  . Generalized anxiety disorder 07/04/2016  . Major depressive disorder, recurrent episode, moderate (HCC) 07/04/2016  . Dizziness 06/01/2016  . Fatigue 05/01/2016  . Sleep difficulties 02/12/2016  . Bariatric surgery status 08/28/2015  . Severe obesity (BMI >= 40) (HCC) 09/04/2014  . Stress 02/14/2014  . SOB (shortness of breath) 09/15/2013  . Environmental allergies 09/15/2013  . Microalbuminuria 07/27/2013  . Vitamin D deficiency 10/09/2012  . Hypertension 09/06/2012  . GERD (gastroesophageal reflux disease) 09/06/2012   09/08/2012 DPT Hilda Lias 08/29/2020, 11:52 AM  [facility] PHYSICAL AND SPORTS MEDICINE [address], [address], [place] Phone: [phone]   Fax:  [phone]  Name: KAELYNN IGO MRN: [name] Date of Birth: 08/22/1981

## 2020-08-30 ENCOUNTER — Ambulatory Visit (INDEPENDENT_AMBULATORY_CARE_PROVIDER_SITE_OTHER): Payer: Managed Care, Other (non HMO) | Admitting: Internal Medicine

## 2020-08-30 ENCOUNTER — Other Ambulatory Visit: Payer: Self-pay

## 2020-08-30 VITALS — BP 138/92 | HR 65 | Temp 98.5°F | Resp 16 | Ht 61.0 in | Wt 241.6 lb

## 2020-08-30 DIAGNOSIS — Z0001 Encounter for general adult medical examination with abnormal findings: Secondary | ICD-10-CM

## 2020-08-30 DIAGNOSIS — F331 Major depressive disorder, recurrent, moderate: Secondary | ICD-10-CM

## 2020-08-30 DIAGNOSIS — E559 Vitamin D deficiency, unspecified: Secondary | ICD-10-CM | POA: Diagnosis not present

## 2020-08-30 DIAGNOSIS — F411 Generalized anxiety disorder: Secondary | ICD-10-CM

## 2020-08-30 DIAGNOSIS — D509 Iron deficiency anemia, unspecified: Secondary | ICD-10-CM | POA: Diagnosis not present

## 2020-08-30 DIAGNOSIS — F439 Reaction to severe stress, unspecified: Secondary | ICD-10-CM

## 2020-08-30 DIAGNOSIS — Z0000 Encounter for general adult medical examination without abnormal findings: Secondary | ICD-10-CM | POA: Insufficient documentation

## 2020-08-30 DIAGNOSIS — K219 Gastro-esophageal reflux disease without esophagitis: Secondary | ICD-10-CM

## 2020-08-30 DIAGNOSIS — E119 Type 2 diabetes mellitus without complications: Secondary | ICD-10-CM

## 2020-08-30 DIAGNOSIS — I10 Essential (primary) hypertension: Secondary | ICD-10-CM

## 2020-08-30 DIAGNOSIS — Z9109 Other allergy status, other than to drugs and biological substances: Secondary | ICD-10-CM

## 2020-08-30 DIAGNOSIS — R197 Diarrhea, unspecified: Secondary | ICD-10-CM

## 2020-08-30 DIAGNOSIS — R928 Other abnormal and inconclusive findings on diagnostic imaging of breast: Secondary | ICD-10-CM

## 2020-08-30 MED ORDER — MUPIROCIN 2 % EX OINT: TOPICAL_OINTMENT | CUTANEOUS | 0 refills | Status: AC

## 2020-08-30 NOTE — Progress Notes (Signed)
Patient ID: Shirley [name], female   DOB: May 25, 1981, 39 y.o.   MRN: [id number]   Subjective:    Patient ID: Shirley [name], female    DOB: [birth year], 39 y.o.   MRN: [id number]  HPI This visit occurred during the SARS-CoV-2 public health emergency.  Safety protocols were in place, including screening questions prior to the visit, additional usage of staff PPE, and extensive cleaning of exam room while observing appropriate contact time as indicated for disinfecting solutions.  Patient here for her physical exam.  She reports increased stress at work.  Discussed with her today.  She is seeing psychiatry for medication management.  Maintained on abilify, lamictal and zoloft.  Also seeing Miguel Dibble for counseling q 3-4 weeks. This is helping.  Does not feel needs any further intervention.  She is trying to watch her diet.  Discussed diet and exercise.  She is having some persistent diarrhea.  Was drinking an increased amount of gataorade.  Off this and then changed to flavored water.  Has cut back on this - to see if would improve symptoms.  No abdominal pain.  No fever. Seeing an allergist.  Symptoms controlled on current regimen.  No increased chest congestion or cough.  No chest pain or sob reported.  Concerned regarding weight - desire weight loss.  Discussed treatment options.     Past Medical History:  Diagnosis Date  . Anemia   . Asthma    WELL CONTROLLED  . Complication of anesthesia    -TACYCARDIA AFTER GASTRIC SURGERY IN 2016 AND HAD TO STAY 5 DAYS-itching after surgery 2005, 2006  . Depression   . Diabetes mellitus ([place])   . Fatty liver   . GERD (gastroesophageal reflux disease)   . Heart murmur   . History of dermoid cyst excision   . History of hiatal hernia   . Hypertension    H/O  . IBS (irritable bowel syndrome)   . Migraine   . PONV (postoperative nausea and vomiting)    nausea   Past Surgical History:  Procedure Laterality Date  . CHOLECYSTECTOMY  2005  .  COLONOSCOPY WITH PROPOFOL N/A 01/23/2020   Procedure: COLONOSCOPY WITH PROPOFOL;  Surgeon: Lucilla Lame, MD;  Location: [facility] ENDOSCOPY;  Service: Endoscopy;  Laterality: N/A;  . DILATATION & CURETTAGE/HYSTEROSCOPY WITH MYOSURE N/A 03/30/2018   Procedure: DILATATION & CURETTAGE/HYSTEROSCOPY;  Surgeon: Homero Fellers, MD;  Location: [facility] ORS;  Service: Gynecology;  Laterality: N/A;  . ESOPHAGOGASTRODUODENOSCOPY (EGD) WITH PROPOFOL N/A 01/23/2020   Procedure: ESOPHAGOGASTRODUODENOSCOPY (EGD) WITH PROPOFOL;  Surgeon: Lucilla Lame, MD;  Location: [facility] ENDOSCOPY;  Service: Endoscopy;  Laterality: N/A;  . GIVENS CAPSULE STUDY N/A 02/09/2020   Procedure: GIVENS CAPSULE STUDY;  Surgeon: Lucilla Lame, MD;  Location: [facility] ENDOSCOPY;  Service: Endoscopy;  Laterality: N/A;  . LAPAROSCOPIC GASTRIC RESTRICTIVE DUODENAL PROCEDURE (DUODENAL SWITCH) N/A 08/28/2015   Procedure: LAPAROSCOPIC GASTRIC RESTRICTIVE DUODENAL PROCEDURE (DUODENAL SWITCH);  Surgeon: Ladora Daniel, MD;  Location: [facility] ORS;  Service: General;  Laterality: N/A;  . 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 Hypertension Mother   . Diabetes Mother   . Asthma Brother   . Breast cancer Neg Hx   . Colon cancer Neg Hx    Social History   Socioeconomic History  . Marital status: Single    Spouse name: Not on file  . Number of children: 0  . Years of education: Not on  file  . Highest education level: Not on file  Occupational History    Employer: [employer]  Tobacco Use  . Smoking status: Never Smoker  . Smokeless tobacco: Never Used  Vaping Use  . Vaping Use: Never used  Substance and Sexual Activity  . Alcohol use: Not Currently    Alcohol/week: 0.0 standard drinks  . Drug use: No  . Sexual activity: Not Currently    Birth control/protection: Other-see comments    Comment: depoprovera every 3 months  Other Topics Concern  . Not on file  Social History Narrative  . Not on file    Social Determinants of Health   Financial Resource Strain:   . Difficulty of Paying Living Expenses: Not on file  Food Insecurity:   . Worried About Charity fundraiser in the Last Year: Not on file  . Ran Out of Food in the Last Year: Not on file  Transportation Needs:   . Lack of Transportation (Medical): Not on file  . Lack of Transportation (Non-Medical): Not on file  Physical Activity:   . Days of Exercise per Week: Not on file  . Minutes of Exercise per Session: Not on file  Stress:   . Feeling of Stress : Not on file  Social Connections:   . Frequency of Communication with Friends and Family: Not on file  . Frequency of Social Gatherings with Friends and Family: Not on file  . Attends Religious Services: Not on file  . Active Member of Clubs or Organizations: Not on file  . Attends Archivist Meetings: Not on file  . Marital Status: Not on file    Outpatient Encounter Medications as of 08/30/2020  Medication Sig  . albuterol (PROVENTIL HFA;VENTOLIN HFA) 108 (90 Base) MCG/ACT inhaler Inhale 2 puffs into the lungs every 6 (six) hours as needed for wheezing or shortness of breath. Reported on 05/01/2016  . ARIPiprazole (ABILIFY) 5 MG tablet Take 5 mg by mouth every morning.  Marland Kitchen azelastine (OPTIVAR) 0.05 % ophthalmic solution Place 1 drop into both eyes 2 (two) times daily as needed (for allergic eye). Reported on 05/01/2016  . blood glucose meter kit and supplies Dispense based on insurance preference. Check glucose once daily  . Blood Glucose Monitoring Suppl (CONTOUR NEXT ONE) KIT 1 kit by Does not apply route See admin instructions. To check blood sugar once daily  . clindamycin (CLEOCIN T) 1 % external solution Apply 1 application topically 2 (two) times daily as needed (applied to breast, groin, and underarms as needed.).  Marland Kitchen clindamycin (CLEOCIN) 300 MG capsule Take 300 mg by mouth See admin instructions. Take 1 capsule (300 mg) daily for 2 weeks as needed HS  .  diclofenac Sodium (VOLTAREN) 1 % GEL Apply 2 g topically at bedtime.   Marland Kitchen glucose blood (CONTOUR NEXT TEST) test strip Use to check blood sugar once a day.  . ketoconazole (NIZORAL) 2 % shampoo Apply 1 application topically 3 (three) times a week. Use 2-3 times a week. Leave in for 5 minutes before rinsing out  . lamoTRIgine (LAMICTAL) 100 MG tablet 100 mg 2 (two) times daily.   Marland Kitchen levocetirizine (XYZAL) 5 MG tablet Take 1 tablet (5 mg total) by mouth every evening. Reported on 05/01/2016  . medroxyPROGESTERone (DEPO-PROVERA) 150 MG/ML injection Inject 1 mL (150 mg total) into the muscle every 3 (three) months.  . metFORMIN (GLUCOPHAGE-XR) 500 MG 24 hr tablet TAKE 1 TABLET(500 MG) BY MOUTH TWICE DAILY WITH A MEAL  .  montelukast (SINGULAIR) 10 MG tablet Take 10 mg by mouth at bedtime.  . mupirocin ointment (BACTROBAN) 2 % Apply to affected area bid  . naproxen (NAPROSYN) 500 MG tablet TAKE 1 TABLET(500 MG) BY MOUTH TWICE DAILY WITH A MEAL AS NEEDED FOR PAIN  . Olopatadine HCl 0.6 % SOLN USE 1 SPRAY INTO THE NOSE DAILY AS NEEDED FOR ALLERGIES  . ondansetron (ZOFRAN) 4 MG tablet Take 1 tablet (4 mg total) by mouth every 6 (six) hours.  . pantoprazole (PROTONIX) 40 MG tablet Take 1 tablet (40 mg total) by mouth every morning.  . rifampin (RIFADIN) 300 MG capsule Take 300 mg by mouth See admin instructions. Take 1 capsule (300 mg) by mouth daily for 2 weeks for HS  . Semaglutide (RYBELSUS) 7 MG TABS Take 7 mg by mouth daily.  . sertraline (ZOLOFT) 100 MG tablet Take 100 mg by mouth daily.  . SUMAtriptan (IMITREX) 100 MG tablet Take 100 mg by mouth as directed.  . traZODone (DESYREL) 50 MG tablet Take 1 tablet by mouth at bedtime.  . triamcinolone ointment (KENALOG) 0.1 % Apply 1 application topically 2 (two) times daily as needed (applied to affected areas of scalp).  . verapamil (CALAN-SR) 120 MG CR tablet Take 1 tablet (120 mg total) by mouth daily.  Grant Ruts INHUB 250-50 MCG/DOSE AEPB INL 1 PUFF PO BID    Facility-Administered Encounter Medications as of 08/30/2020  Medication  . polyethylene glycol (MIRALAX / GLYCOLAX) packet 17 g    Review of Systems  Constitutional: Negative for appetite change and unexpected weight change.  HENT: Negative for congestion, sinus pressure and sore throat.   Eyes: Negative for pain and visual disturbance.  Respiratory: Negative for cough, chest tightness and shortness of breath.   Cardiovascular: Negative for chest pain, palpitations and leg swelling.  Gastrointestinal: Negative for abdominal pain, constipation and diarrhea.  Genitourinary: Negative for difficulty urinating and dysuria.  Musculoskeletal: Negative for joint swelling and myalgias.  Skin: Negative for color change and rash.  Neurological: Negative for dizziness, light-headedness and headaches.  Hematological: Negative for adenopathy. Does not bruise/bleed easily.  Psychiatric/Behavioral: Negative for decreased concentration and dysphoric mood.       Objective:    Physical Exam Vitals reviewed.  Constitutional:      General: She is not in acute distress.    Appearance: Normal appearance.  HENT:     Head: Normocephalic and atraumatic.     Right Ear: External ear normal.     Left Ear: External ear normal.  Neck:     Thyroid: No thyromegaly.  Cardiovascular:     Rate and Rhythm: Normal rate and regular rhythm.  Pulmonary:     Effort: No respiratory distress.     Breath sounds: Normal breath sounds. No wheezing.     Comments: Breasts:  No nipple discharge or nipple retraction present.  No distinct nodule.  No axillary adenopathy.  Abdominal:     General: Bowel sounds are normal.     Palpations: Abdomen is soft.     Tenderness: There is no abdominal tenderness.  Musculoskeletal:        General: No swelling or tenderness.     Cervical back: Neck supple. No tenderness.  Lymphadenopathy:     Cervical: No cervical adenopathy.  Skin:    Findings: No erythema or rash.    Neurological:     Mental Status: She is alert.  Psychiatric:        Mood and Affect: Mood normal.  Behavior: Behavior normal.     BP (!) 138/92   Pulse 65   Temp 98.5 F (36.9 C) (Oral)   Resp 16   Ht $R'5\' 1"'wN$  (1.549 m)   Wt 241 lb 9.6 oz (109.6 kg)   SpO2 99%   BMI 45.65 kg/m  Wt Readings from Last 3 Encounters:  08/30/20 241 lb 9.6 oz (109.6 kg)  05/30/20 236 lb (107 kg)  02/06/20 221 lb (100.2 kg)     Lab Results  Component Value Date   WBC 8.2 04/03/2020   HGB 11.3 (L) 04/03/2020   HCT 36.3 04/03/2020   PLT 248 04/03/2020   GLUCOSE 98 03/26/2020   CHOL 148 03/26/2020   TRIG 58 03/26/2020   HDL 65 03/26/2020   LDLCALC 71 03/26/2020   ALT 12 03/26/2020   AST 16 03/26/2020   NA 140 03/26/2020   K 4.2 03/26/2020   CL 102 03/26/2020   CREATININE 0.77 03/26/2020   BUN 8 03/26/2020   CO2 20 03/26/2020   TSH 2.040 07/04/2019   HGBA1C 5.8 (H) 03/26/2020    US BREAST LTD UNI RIGHT INC AXILLA  Result Date: 06/21/2020 CLINICAL DATA:  Short-term follow-up for a probably benign lesion in the right breast, initially assessed on 08/15/2019. EXAM: ULTRASOUND OF THE RIGHT BREAST COMPARISON:  08/15/2019 and prior mammograms. FINDINGS: Targeted ultrasound is performed, showing a small cluster of cysts at the 12:30 o'clock position of the right breast, 7 cm the nipple, measuring 5 x 4 x 4 mm, unchanged from the prior exam. There are thin intervening septations between the cysts, but no evidence of intervening tissue and no other complicating feature. IMPRESSION: 1. No evidence of breast malignancy. 2. Small benign cluster of cysts/apocrine cyst in the right breast at 12:30 o'clock, 7 cm the nipple, stable for 10 months. RECOMMENDATION: Screening mammogram in September 2021, last screening study dated 08/04/2019.(Code:SM-B-01Y) I have discussed the findings and recommendations with the patient. If applicable, a reminder letter will be sent to the patient regarding the next  appointment. BI-RADS CATEGORY  2: Benign. Electronically Signed   By: Lajean Manes M.D.   On: 06/21/2020 09:42       Assessment & Plan:   Problem List Items Addressed This Visit    Vitamin D deficiency - Primary    Follow vitamin D level.       Relevant Orders   VITAMIN D 25 Hydroxy (Vit-D Deficiency, Fractures)   Type 2 diabetes mellitus without complication, without long-term current use of insulin (HCC)    Low carb diet and exercise.  Taking rybelsus and metformin (extended release).  Has been seeing endocrinology.  Last a1c improved.  Overdue labs.  Gets at [facility]. Orders placed.        Relevant Orders   Hemoglobin A1c   Hepatic function panel   Lipid panel   TSH   Basic metabolic panel   Stress    Increased stress.  Seeing psychiatry - Dr Nicolasa Ducking.  Stable on current regimen as outlined in HPI.  Seeing Miguel Dibble for counseling. Follow.        Severe obesity (BMI >= 40) (HCC)    Discussed diet and exercise.  She is interested in weight loss medication.  Discussed - on rybelsus.  Follow.        Major depressive disorder, recurrent episode, moderate ([place])    Followed by psychiatry.  Stable on abilify and zoloft.        Iron deficiency anemia  Has been followed by Dr Janese Banks.  Follow cbc and iron studies.       Relevant Orders   CBC with Differential/Platelet   Ferritin   Iron and TIBC   Hypertension    On verapamil.  Blood pressure has been doing better.  On recheck today - wnl. Follow pressures. Follow metabolic panel.       Healthcare maintenance    Physical today 08/30/20.  PAP through gyn.  Schedule mammogram.       GERD (gastroesophageal reflux disease)    On protonix.  No upper symptoms reported.       Generalized anxiety disorder    Followed by psychiatry.  Stable on abilify, lamictal and zoloft.        Environmental allergies    Followed by Dr Donneta Romberg.  Stable.       Diarrhea    Persistent diarrhea as outlined.  On extended release metformin.   Has been on this for years.  Hold on changing.  Has stopped increased amount of gatorade.  Is cutting back on flavored water.  Check routine labs and stool studies.  Further w/up pending results.  No abdominal pain or fever.       Abnormal mammogram    Last mammogram recommended f/u right breast ultrasound - cysts 12:30 right breast.  Scheduled for screening mammogram - 09/2020.           Einar Pheasant, MD

## 2020-08-30 NOTE — Assessment & Plan Note (Addendum)
Physical today 08/30/20.  PAP through gyn.  Schedule mammogram.

## 2020-09-03 ENCOUNTER — Other Ambulatory Visit: Payer: Self-pay

## 2020-09-03 ENCOUNTER — Ambulatory Visit: Payer: Managed Care, Other (non HMO) | Admitting: Physical Therapy

## 2020-09-03 ENCOUNTER — Encounter: Payer: Self-pay | Admitting: Physical Therapy

## 2020-09-03 DIAGNOSIS — M545 Low back pain, unspecified: Secondary | ICD-10-CM

## 2020-09-03 DIAGNOSIS — G8929 Other chronic pain: Secondary | ICD-10-CM

## 2020-09-03 DIAGNOSIS — R293 Abnormal posture: Secondary | ICD-10-CM

## 2020-09-03 DIAGNOSIS — M533 Sacrococcygeal disorders, not elsewhere classified: Secondary | ICD-10-CM

## 2020-09-03 NOTE — Therapy (Signed)
[facility] PHYSICAL AND SPORTS MEDICINE [address] [place], [address] Phone: [phone]   Fax:  [phone]  Physical Therapy Treatment  Patient Details  Name: Shirley [name] MRN: [id number] Date of Birth: 1981-02-07 No data recorded  Encounter Date: 09/03/2020   PT End of Session - 09/03/20 0957    Visit Number 6    Number of Visits 17    Date for PT Re-Evaluation 10/05/20    PT Start Time 0952    PT Stop Time 1030    PT Time Calculation (min) 38 min    Activity Tolerance Patient tolerated treatment well    Behavior During Therapy [facility] for tasks assessed/performed           Past Medical History:  Diagnosis Date  . Anemia   . Asthma    WELL CONTROLLED  . Complication of anesthesia    -TACYCARDIA AFTER GASTRIC SURGERY IN 2016 AND HAD TO STAY 5 DAYS-itching after surgery 2005, 2006  . Depression   . Diabetes mellitus (HCC)   . Fatty liver   . GERD (gastroesophageal reflux disease)   . Heart murmur   . History of dermoid cyst excision   . History of hiatal hernia   . Hypertension    H/O  . IBS (irritable bowel syndrome)   . Migraine   . PONV (postoperative nausea and vomiting)    nausea    Past Surgical History:  Procedure Laterality Date  . CHOLECYSTECTOMY  2005  . COLONOSCOPY WITH PROPOFOL N/A 01/23/2020   Procedure: COLONOSCOPY WITH PROPOFOL;  Surgeon: Midge Minium, MD;  Location: [facility] ENDOSCOPY;  Service: Endoscopy;  Laterality: N/A;  . DILATATION & CURETTAGE/HYSTEROSCOPY WITH MYOSURE N/A 03/30/2018   Procedure: DILATATION & CURETTAGE/HYSTEROSCOPY;  Surgeon: Natale Milch, MD;  Location: [facility] ORS;  Service: Gynecology;  Laterality: N/A;  . ESOPHAGOGASTRODUODENOSCOPY (EGD) WITH PROPOFOL N/A 01/23/2020   Procedure: ESOPHAGOGASTRODUODENOSCOPY (EGD) WITH PROPOFOL;  Surgeon: Midge Minium, MD;  Location: [facility] ENDOSCOPY;  Service: Endoscopy;  Laterality: N/A;  . GIVENS CAPSULE STUDY N/A 02/09/2020   Procedure:  GIVENS CAPSULE STUDY;  Surgeon: Midge Minium, MD;  Location: [facility] ENDOSCOPY;  Service: Endoscopy;  Laterality: N/A;  . LAPAROSCOPIC GASTRIC RESTRICTIVE DUODENAL PROCEDURE (DUODENAL SWITCH) N/A 08/28/2015   Procedure: LAPAROSCOPIC GASTRIC RESTRICTIVE DUODENAL PROCEDURE (DUODENAL SWITCH);  Surgeon: [name], MD;  Location: [facility] ORS;  Service: General;  Laterality: N/A;  . 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   Subjective Assessment - 09/03/20 0955    Subjective Patient reports no pain today, reports she did very well over the weekend, without pain. Completing HEP.    Pertinent History Pt is a 39 year old female presenting with chronic LBP with sacral pain. Reports she had weight loss surgery in Oct 2016 and has had tailbone pain since. 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 Reports sacral/LBP is more R sided, does not radiate, and denies numbness/tingling. Pain is dull ache, "feels like a bruise that she can touch", that is worse with sitting >1 hour at work or driving. Reports she has tried many seat cushions, different chairs, etc. But that they do not ease the pain. She tries to stand and walk around to help with pain, and  it decreases it temporarily. Worst pain over the past week: 10/10 best: 0/10. Pt denies N/V, B&B changes, unexplained weight fluctuation, saddle paresthesia, fever, night sweats, or unrelenting night pain at this time.    Limitations Lifting;Sitting    How long can you sit comfortably? >1hour    How long can you stand comfortably? unlimited    How long can you walk comfortably? unlimited    Diagnostic tests None    Patient Stated Goals decrease pain    Pain Onset More than a month ago           Ther-Ex Nustep L4 seat 7 UEs 7 for gentle strengthening, and lumbar rotation SL  bridge from bosu hardside 2x 5 each LE with cuing for full hip ext with good carry over, min lift with SL Prone alt supermans with knee flex 3x 10 with min cuing for eccentric lower with decent carry over Cable pull throughs 25# 3x 10 with demo and max cuing for proper technique with glute activation without excessive lumbar ext with increased time needd for understanding Anti rotation in palloff 2x 10 bilat in mini squat with cuing for posture and eccentric control with good carry over Modified seated childs pose with theraball; lateral each direction -Sittingfigure 4 stretch 30sec hold bilat                    PT Education - 09/03/20 0956    Education Details therex form/technique    Person(s) Educated Patient    Methods Explanation;Demonstration;Verbal cues    Comprehension Verbalized understanding;Returned demonstration;Verbal cues required            PT Short Term Goals - 08/20/20 1152      PT SHORT TERM GOAL #1   Title Pt will be independent with HEP in order to improve strength and decrease back pain in order to improve pain-free function at home and work.    Baseline 08/08/20 HEP given    Time 4    Period Weeks    Status New             PT Long Term Goals - 08/20/20 1153      PT LONG TERM GOAL #1   Title Pt will decrease 5TSTS by at least 3 seconds in order to demonstrate clinically significant improvement in LE strength    Baseline 08/08/20 13sec    Time 8    Period Weeks    Status New      PT LONG TERM GOAL #2   Title Patient will increase FOTO score to 74 to demonstrate predicted increase in functional mobility to complete ADLs    Baseline 08/08/20    Time 8    Period Weeks    Status New      PT LONG TERM GOAL #3   Title Pt will decrease worst back pain as reported on NPRS by at least 2 points in order to demonstrate clinically significant reduction in back pain.    Baseline 08/08/20 10/10    Time 8    Period Weeks    Status New                  Plan - 09/03/20 1000    Clinical Impression Statement PT continued therex progression for core and hip stability and strengthening with good success. Patient is able to complete therex with proper technique following multimodal cuing with good motivation throughout session, and no increased pain. Patient with difficulty with  glute activation > lumbar ext, but is motivated to correct this, and ultimately able. PT will continue progresion as able.    Personal Factors and Comorbidities Comorbidity 1;Comorbidity 2;Comorbidity 3+;Sex;Profession;Past/Current Experience;Fitness;Time since onset of injury/illness/exacerbation    Comorbidities HTN, DM2, obesity    Examination-Activity Limitations Squat;Lift;Bend;Sit;Transfers    Stability/Clinical Decision Making Evolving/Moderate complexity    Clinical Decision Making Moderate    Rehab Potential Good    PT Frequency 3x / week    PT Duration 8 weeks    PT Treatment/Interventions ADLs/Self Care Home Management;Electrical Stimulation;Therapeutic activities;Patient/family education;Spinal Manipulations;Joint Manipulations;Dry needling;Manual techniques;Cryotherapy;Ultrasound;Functional mobility training;Neuromuscular re-education;Passive range of motion;Therapeutic exercise;DME Instruction;Iontophoresis 4mg /ml Dexamethasone;Moist Heat;Traction;Stair training;Gait training    PT Next Visit Plan HEP review, postural restoration,    PT Home Exercise Plan childs pose, piriformis stretch, post pelvic tilt, bridge    Consulted and Agree with Plan of Care Patient           Patient will benefit from skilled therapeutic intervention in order to improve the following deficits and impairments:  Decreased balance, Difficulty walking, Impaired flexibility, Obesity, Decreased activity tolerance, Decreased endurance, Decreased range of motion, Decreased strength, Decreased mobility, Decreased coordination, Increased muscle spasms, Postural  dysfunction, Abnormal gait, Improper body mechanics, Pain  Visit Diagnosis: Sacrococcygeal disorders, not elsewhere classified  Chronic bilateral low back pain without sciatica  Abnormal posture     Problem List Patient Active Problem List   Diagnosis Date Noted  . Healthcare maintenance 08/30/2020  . Diarrhea 08/30/2020  . Abnormal mammogram 06/03/2020  . Iron deficiency anemia 02/06/2020  . Polyp of descending colon   . Anemia 02/13/2019  . Herpes zoster 01/16/2019  . Type 2 diabetes mellitus without complication, without long-term current use of insulin (HCC) 10/17/2018  . Bloating 08/07/2018  . BMI 45.0-49.9, adult (HCC) 06/09/2018  . Epigastric pain 06/09/2018  . Status post hysteroscopy 03/30/2018  . Asthma exacerbation 09/16/2017  . Tooth abscess 05/29/2017  . Cough 01/31/2017  . Right shoulder pain 11/11/2016  . Headache 11/11/2016  . Sinusitis 08/31/2016  . Generalized anxiety disorder 07/04/2016  . Major depressive disorder, recurrent episode, moderate (HCC) 07/04/2016  . Dizziness 06/01/2016  . Fatigue 05/01/2016  . Sleep difficulties 02/12/2016  . Bariatric surgery status 08/28/2015  . Severe obesity (BMI >= 40) (HCC) 09/04/2014  . Stress 02/14/2014  . SOB (shortness of breath) 09/15/2013  . Environmental allergies 09/15/2013  . Microalbuminuria 07/27/2013  . Vitamin D deficiency 10/09/2012  . Hypertension 09/06/2012  . GERD (gastroesophageal reflux disease) 09/06/2012   09/08/2012 DPT Hilda Lias 09/03/2020, 10:23 AM  [place] [facility] PHYSICAL AND SPORTS MEDICINE [address], [address], [place] Phone: [phone]   Fax:  [phone]  Name: Shirley [name] MRN: [name] Date of Birth: [birth year]

## 2020-09-05 ENCOUNTER — Ambulatory Visit: Payer: Managed Care, Other (non HMO) | Admitting: Physical Therapy

## 2020-09-05 ENCOUNTER — Other Ambulatory Visit
Admission: RE | Admit: 2020-09-05 | Discharge: 2020-09-05 | Disposition: A | Discharge status: Home / Self Care | Payer: Managed Care, Other (non HMO) | Source: Ambulatory Visit | Attending: Internal Medicine | Admitting: Internal Medicine

## 2020-09-05 DIAGNOSIS — R197 Diarrhea, unspecified: Secondary | ICD-10-CM | POA: Insufficient documentation

## 2020-09-05 LAB — GASTROINTESTINAL PANEL BY PCR, STOOL (REPLACES STOOL CULTURE)

## 2020-09-06 ENCOUNTER — Encounter: Payer: Self-pay | Admitting: Internal Medicine

## 2020-09-06 NOTE — Assessment & Plan Note (Signed)
Last mammogram recommended f/u right breast ultrasound - cysts 12:30 right breast.  Scheduled for screening mammogram - 09/2020.

## 2020-09-06 NOTE — Assessment & Plan Note (Signed)
Follow vitamin D level.  

## 2020-09-06 NOTE — Assessment & Plan Note (Addendum)
Followed by psychiatry.  Stable on abilify, lamictal and zoloft.

## 2020-09-06 NOTE — Assessment & Plan Note (Signed)
Followed by psychiatry.  Stable on abilify and zoloft.

## 2020-09-06 NOTE — Assessment & Plan Note (Signed)
Has been followed by Dr Rao.  Follow cbc and iron studies.  

## 2020-09-06 NOTE — Assessment & Plan Note (Signed)
Increased stress.  Seeing psychiatry - Dr Maryruth Bun.  Stable on current regimen as outlined in HPI.  Seeing Felecia Jan for counseling. Follow.

## 2020-09-06 NOTE — Assessment & Plan Note (Signed)
Followed by Dr Sharma.  Stable.  

## 2020-09-06 NOTE — Assessment & Plan Note (Signed)
Discussed diet and exercise.  She is interested in weight loss medication.  Discussed - on rybelsus.  Follow.

## 2020-09-06 NOTE — Assessment & Plan Note (Signed)
On protonix.  No upper symptoms reported.   

## 2020-09-06 NOTE — Assessment & Plan Note (Addendum)
On verapamil.  Blood pressure has been doing better.  On recheck today - wnl. Follow pressures. Follow metabolic panel.

## 2020-09-06 NOTE — Assessment & Plan Note (Signed)
Low carb diet and exercise.  Taking rybelsus and metformin (extended release).  Has been seeing endocrinology.  Last a1c improved.  Overdue labs.  Gets at [facility]. Orders placed.

## 2020-09-06 NOTE — Assessment & Plan Note (Signed)
Persistent diarrhea as outlined.  On extended release metformin.  Has been on this for years.  Hold on changing.  Has stopped increased amount of gatorade.  Is cutting back on flavored water.  Check routine labs and stool studies.  Further w/up pending results.  No abdominal pain or fever.

## 2020-09-08 LAB — VITAMIN D 25 HYDROXY (VIT D DEFICIENCY, FRACTURES): Vit D, 25-Hydroxy: 10.7 ng/mL — ABNORMAL LOW (ref 30.0–100.0)

## 2020-09-08 LAB — BASIC METABOLIC PANEL
BUN/Creatinine Ratio: 10 (ref 9–23)
BUN: 8 mg/dL (ref 6–24)
CO2: 22 mmol/L (ref 20–29)
Calcium: 9.5 mg/dL (ref 8.7–10.2)
Chloride: 101 mmol/L (ref 96–106)
Creatinine, Ser: 0.81 mg/dL (ref 0.57–1.00)
GFR calc Af Amer: 104 mL/min/{1.73_m2} (ref >59)
GFR calc non Af Amer: 90 mL/min/{1.73_m2} (ref >59)
Glucose: 115 mg/dL — ABNORMAL HIGH (ref 65–99)
Potassium: 4.2 mmol/L (ref 3.5–5.2)
Sodium: 139 mmol/L (ref 134–144)

## 2020-09-08 LAB — HEPATIC FUNCTION PANEL
ALT: 13 IU/L (ref 0–32)
AST: 14 IU/L (ref 0–40)
Albumin: 4.4 g/dL (ref 3.8–4.8)
Alkaline Phosphatase: 101 IU/L (ref 44–121)
Bilirubin Total: 0.6 mg/dL (ref 0.0–1.2)
Bilirubin, Direct: 0.14 mg/dL (ref 0.00–0.40)
Total Protein: 6.8 g/dL (ref 6.0–8.5)

## 2020-09-08 LAB — IRON AND TIBC
Iron Saturation: 28 % (ref 15–55)
Iron: 91 ug/dL (ref 27–159)
Total Iron Binding Capacity: 330 ug/dL (ref 250–450)
UIBC: 239 ug/dL (ref 131–425)

## 2020-09-08 LAB — FERRITIN: Ferritin: 228 ng/mL — ABNORMAL HIGH (ref 15–150)

## 2020-09-08 LAB — CBC WITH DIFFERENTIAL/PLATELET
Basophils Absolute: 0 10*3/uL (ref 0.0–0.2)
Basos: 0 %
EOS (ABSOLUTE): 0 10*3/uL (ref 0.0–0.4)
Eos: 0 %
Hematocrit: 38.1 % (ref 34.0–46.6)
Hemoglobin: 11.8 g/dL (ref 11.1–15.9)
Immature Grans (Abs): 0 10*3/uL (ref 0.0–0.1)
Immature Granulocytes: 0 %
Lymphocytes Absolute: 2.3 10*3/uL (ref 0.7–3.1)
Lymphs: 44 %
MCH: 22.7 pg — ABNORMAL LOW (ref 26.6–33.0)
MCHC: 31 g/dL — ABNORMAL LOW (ref 31.5–35.7)
MCV: 73 fL — ABNORMAL LOW (ref 79–97)
Monocytes Absolute: 0.3 10*3/uL (ref 0.1–0.9)
Monocytes: 6 %
Neutrophils Absolute: 2.5 10*3/uL (ref 1.4–7.0)
Neutrophils: 50 %
Platelets: 228 10*3/uL (ref 150–450)
RBC: 5.2 x10E6/uL (ref 3.77–5.28)
RDW: 14.8 % (ref 11.7–15.4)
WBC: 5.2 10*3/uL (ref 3.4–10.8)

## 2020-09-08 LAB — LIPID PANEL
Chol/HDL Ratio: 2.7 ratio (ref 0.0–4.4)
Cholesterol, Total: 161 mg/dL (ref 100–199)
HDL: 60 mg/dL (ref >39)
LDL Chol Calc (NIH): 86 mg/dL (ref 0–99)
Triglycerides: 80 mg/dL (ref 0–149)
VLDL Cholesterol Cal: 15 mg/dL (ref 5–40)

## 2020-09-08 LAB — HEMOGLOBIN A1C
Est. average glucose Bld gHb Est-mCnc: 148 mg/dL
Hgb A1c MFr Bld: 6.8 % — ABNORMAL HIGH (ref 4.8–5.6)

## 2020-09-08 LAB — TSH: TSH: 1.53 u[IU]/mL (ref 0.450–4.500)

## 2020-09-10 ENCOUNTER — Other Ambulatory Visit: Payer: Self-pay

## 2020-09-10 ENCOUNTER — Encounter: Payer: Self-pay | Admitting: Physical Therapy

## 2020-09-10 ENCOUNTER — Ambulatory Visit: Payer: Managed Care, Other (non HMO) | Admitting: Physical Therapy

## 2020-09-10 DIAGNOSIS — M533 Sacrococcygeal disorders, not elsewhere classified: Secondary | ICD-10-CM | POA: Diagnosis not present

## 2020-09-10 DIAGNOSIS — G8929 Other chronic pain: Secondary | ICD-10-CM

## 2020-09-10 DIAGNOSIS — M545 Low back pain, unspecified: Secondary | ICD-10-CM

## 2020-09-10 DIAGNOSIS — R293 Abnormal posture: Secondary | ICD-10-CM

## 2020-09-10 NOTE — Therapy (Signed)
[place] PHYSICAL AND SPORTS MEDICINE [address] [place], [address] Phone: [phone]   Fax:  [phone]  Physical Therapy Treatment/Discharge Summary Reporting Period 08/07/20 - 09/10/20  Patient Details  Name: Shirley [name] MRN: [id number] Date of Birth: [birth year] No data recorded  Encounter Date: 09/10/2020   PT End of Session - 09/10/20 0951    Visit Number 7    Number of Visits 17    Date for PT Re-Evaluation 10/05/20    PT Start Time 0947    PT Stop Time 1012    PT Time Calculation (min) 25 min    Activity Tolerance Patient tolerated treatment well    Behavior During Therapy [facility] for tasks assessed/performed           Past Medical History:  Diagnosis Date  . Anemia   . Asthma    WELL CONTROLLED  . Complication of anesthesia    -TACYCARDIA AFTER GASTRIC SURGERY IN 2016 AND HAD TO STAY 5 DAYS-itching after surgery 2005, 2006  . Depression   . Diabetes mellitus ([place])   . Fatty liver   . GERD (gastroesophageal reflux disease)   . Heart murmur   . History of dermoid cyst excision   . History of hiatal hernia   . Hypertension    H/O  . IBS (irritable bowel syndrome)   . Migraine   . PONV (postoperative nausea and vomiting)    nausea    Past Surgical History:  Procedure Laterality Date  . CHOLECYSTECTOMY  2005  . COLONOSCOPY WITH PROPOFOL N/A 01/23/2020   Procedure: COLONOSCOPY WITH PROPOFOL;  Surgeon: Lucilla Lame, MD;  Location: [facility] ENDOSCOPY;  Service: Endoscopy;  Laterality: N/A;  . DILATATION & CURETTAGE/HYSTEROSCOPY WITH MYOSURE N/A 03/30/2018   Procedure: DILATATION & CURETTAGE/HYSTEROSCOPY;  Surgeon: Homero Fellers, MD;  Location: [facility] ORS;  Service: Gynecology;  Laterality: N/A;  . ESOPHAGOGASTRODUODENOSCOPY (EGD) WITH PROPOFOL N/A 01/23/2020   Procedure: ESOPHAGOGASTRODUODENOSCOPY (EGD) WITH PROPOFOL;  Surgeon: Lucilla Lame, MD;  Location: [facility] ENDOSCOPY;  Service: Endoscopy;  Laterality: N/A;   . GIVENS CAPSULE STUDY N/A 02/09/2020   Procedure: GIVENS CAPSULE STUDY;  Surgeon: Lucilla Lame, MD;  Location: [facility] ENDOSCOPY;  Service: Endoscopy;  Laterality: N/A;  . LAPAROSCOPIC GASTRIC RESTRICTIVE DUODENAL PROCEDURE (DUODENAL SWITCH) N/A 08/28/2015   Procedure: LAPAROSCOPIC GASTRIC RESTRICTIVE DUODENAL PROCEDURE (DUODENAL SWITCH);  Surgeon: Ladora Daniel, MD;  Location: [facility] ORS;  Service: General;  Laterality: N/A;  . 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   Subjective Assessment - 09/10/20 0948    Subjective Patient reports no pain over the weekend, reports compliance with HEP.    Pertinent History Pt is a 39 year old female presenting with chronic LBP with sacral pain. Reports she had weight loss surgery in Oct 2016 and has had tailbone pain since. 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 Reports sacral/LBP is more R sided, does not radiate, and denies numbness/tingling. Pain is dull ache, "feels like a bruise that she can touch", that is worse with sitting >1 hour at work or driving. Reports she has tried many seat cushions, different chairs, etc. But that they do not ease the pain. She tries to stand and walk around to help with pain, and  it decreases it temporarily. Worst pain over the past week: 10/10 best: 0/10. Pt denies N/V, B&B changes, unexplained weight fluctuation, saddle paresthesia, fever, night sweats, or unrelenting night pain at this time.    Limitations Lifting;Sitting    How long can you sit comfortably? >1hour    How long can you stand comfortably? unlimited    How long can you walk comfortably? unlimited    Diagnostic tests None    Patient Stated Goals decrease pain    Pain Onset More than a month ago           Ther-Ex Nustep L4 seat 7 UEs 7 48min for gentle strengthening, and  lumbar rotation PT reviewed the following HEP with patient with patient able to demonstrate a set of the following with min cuing for correction needed. PT educated patient on parameters of therex (how/when to inc/decrease intensity, frequency, rep/set range, stretch hold time, and purpose of therex) with verbalized understanding.  Access Code: [email or web address] Single Leg Bridge - 1 x daily - 2-3 x weekly - 3 sets - 10 reps Bird Dog - 1 x daily - 2-3 x weekly - 3 sets - 10 reps Supine March - 1 x daily - 2-3 x weekly - 3 sets - 10 reps Squat with Chair Touch - 1 x daily - 2-3 x weekly - 3 sets - 10 reps Seated Child's Pose with Table - 2 x daily - 7 x weekly - 30-6060sec hold Seated Piriformis Stretch with Trunk Bend - 2 x daily - 7 x weekly - 30-60sec hold                            PT Education - 09/10/20 0950    Education Details therex form/technique    Person(s) Educated Patient    Methods Explanation;Demonstration;Verbal cues    Comprehension Verbalized understanding;Returned demonstration;Verbal cues required            PT Short Term Goals - 09/10/20 0953      PT SHORT TERM GOAL #1   Title Pt will be independent with HEP in order to improve strength and decrease back pain in order to improve pain-free function at home and work.    Baseline 08/08/20 HEP given; 1018/21 completing HEP, does report she could be completing more    Time 4    Period Weeks    Status Achieved             PT Long Term Goals - 09/10/20 0954      PT LONG TERM GOAL #1   Title Pt will decrease 5TSTS by at least 3 seconds in order to demonstrate clinically significant improvement in LE strength    Baseline 08/08/20 13sec; 09/10/20 10sec    Time 8    Period Weeks    Status Achieved      PT LONG TERM GOAL #2   Title Patient will increase FOTO score to 74 to demonstrate predicted increase in functional mobility to complete ADLs    Baseline 08/08/20 74; 09/10/20 94    Time 8    Period  Weeks      PT LONG TERM GOAL #3   Title Pt will decrease worst back pain as reported on NPRS by at least 2 points in order to demonstrate clinically significant reduction in back pain.    Baseline 08/08/20 10/10; 09/10/20 2/10    Time 8    Period Weeks    Status Achieved  Plan - 09/10/20 1020    Clinical Impression Statement PT reassessed goals this session where patient has met all goals to safely d/c to robust HEP. PT reviewed HEP with patient to maintain strength and decreased pain gains, patient able to demonstrate and verbalize understanding. Pt given clinic contact info should any further questions or concerns arise.    Personal Factors and Comorbidities Comorbidity 1;Comorbidity 2;Comorbidity 3+;Sex;Profession;Past/Current Experience;Fitness;Time since onset of injury/illness/exacerbation    Comorbidities HTN, DM2, obesity    Examination-Activity Limitations Squat;Lift;Bend;Sit;Transfers    Examination-Participation Restrictions Driving;Occupation    Stability/Clinical Decision Making Evolving/Moderate complexity    Clinical Decision Making Moderate    Rehab Potential Good    PT Frequency 3x / week    PT Duration 8 weeks    PT Treatment/Interventions ADLs/Self Care Home Management;Electrical Stimulation;Therapeutic activities;Patient/family education;Spinal Manipulations;Joint Manipulations;Dry needling;Manual techniques;Cryotherapy;Ultrasound;Functional mobility training;Neuromuscular re-education;Passive range of motion;Therapeutic exercise;DME Instruction;Iontophoresis 4mg /ml Dexamethasone;Moist Heat;Traction;Stair training;Gait training    PT Next Visit Plan HEP review, postural restoration,    PT Home Exercise Plan childs pose, piriformis stretch, post pelvic tilt, bridge    Consulted and Agree with Plan of Care Patient           Patient will benefit from skilled therapeutic intervention in order to improve the following deficits and impairments:   Decreased balance, Difficulty walking, Impaired flexibility, Obesity, Decreased activity tolerance, Decreased endurance, Decreased range of motion, Decreased strength, Decreased mobility, Decreased coordination, Increased muscle spasms, Postural dysfunction, Abnormal gait, Improper body mechanics, Pain  Visit Diagnosis: Sacrococcygeal disorders, not elsewhere classified  Chronic bilateral low back pain without sciatica  Abnormal posture     Problem List Patient Active Problem List   Diagnosis Date Noted  . Healthcare maintenance 08/30/2020  . Diarrhea 08/30/2020  . Abnormal mammogram 06/03/2020  . Iron deficiency anemia 02/06/2020  . Polyp of descending colon   . Anemia 02/13/2019  . Herpes zoster 01/16/2019  . Type 2 diabetes mellitus without complication, without long-term current use of insulin ([place]) 10/17/2018  . Bloating 08/07/2018  . BMI 45.0-49.9, adult ([place]) 06/09/2018  . Epigastric pain 06/09/2018  . Status post hysteroscopy 03/30/2018  . Asthma exacerbation 09/16/2017  . Tooth abscess 05/29/2017  . Cough 01/31/2017  . Right shoulder pain 11/11/2016  . Headache 11/11/2016  . Sinusitis 08/31/2016  . Generalized anxiety disorder 07/04/2016  . Major depressive disorder, recurrent episode, moderate ([place]) 07/04/2016  . Dizziness 06/01/2016  . Fatigue 05/01/2016  . Sleep difficulties 02/12/2016  . Bariatric surgery status 08/28/2015  . Severe obesity (BMI >= 40) ([place]) 09/04/2014  . Stress 02/14/2014  . SOB (shortness of breath) 09/15/2013  . Environmental allergies 09/15/2013  . Microalbuminuria 07/27/2013  . Vitamin D deficiency 10/09/2012  . Hypertension 09/06/2012  . GERD (gastroesophageal reflux disease) 09/06/2012   Durwin Reges DPT Durwin Reges 09/10/2020, 10:27 AM  [place] PHYSICAL AND SPORTS MEDICINE [address] [place], [address] Phone: [phone]   Fax:  [phone]  Name: THAIS SILBERSTEIN MRN: [id number] Date of Birth: 05-21-1981

## 2020-09-11 ENCOUNTER — Other Ambulatory Visit: Payer: Self-pay

## 2020-09-11 MED ORDER — VITAMIN D (ERGOCALCIFEROL) 1.25 MG (50000 UNIT) PO CAPS: 50000.0000 [IU] | ORAL_CAPSULE | ORAL | 2 refills | Status: AC

## 2020-09-12 ENCOUNTER — Ambulatory Visit: Payer: Managed Care, Other (non HMO) | Admitting: Physical Therapy

## 2020-09-17 ENCOUNTER — Encounter: Payer: Managed Care, Other (non HMO) | Admitting: Physical Therapy

## 2020-09-19 ENCOUNTER — Encounter: Payer: Managed Care, Other (non HMO) | Admitting: Physical Therapy

## 2020-09-28 ENCOUNTER — Ambulatory Visit
Admission: RE | Admit: 2020-09-28 | Discharge: 2020-09-28 | Disposition: A | Discharge status: Home / Self Care | Payer: Managed Care, Other (non HMO) | Source: Ambulatory Visit | Attending: Internal Medicine | Admitting: Internal Medicine

## 2020-09-28 ENCOUNTER — Encounter: Payer: Self-pay | Admitting: Internal Medicine

## 2020-09-28 ENCOUNTER — Other Ambulatory Visit: Payer: Self-pay

## 2020-09-28 DIAGNOSIS — Z1231 Encounter for screening mammogram for malignant neoplasm of breast: Secondary | ICD-10-CM | POA: Insufficient documentation

## 2020-09-30 ENCOUNTER — Other Ambulatory Visit: Payer: Self-pay | Admitting: Internal Medicine

## 2020-10-02 ENCOUNTER — Other Ambulatory Visit: Payer: Self-pay

## 2020-10-02 MED ORDER — RYBELSUS 7 MG PO TABS: 1.0000 | ORAL_TABLET | Freq: Every day | ORAL | 0 refills | Status: AC

## 2020-10-05 ENCOUNTER — Ambulatory Visit (INDEPENDENT_AMBULATORY_CARE_PROVIDER_SITE_OTHER): Payer: Managed Care, Other (non HMO)

## 2020-10-05 ENCOUNTER — Other Ambulatory Visit: Payer: Self-pay

## 2020-10-05 DIAGNOSIS — Z3042 Encounter for surveillance of injectable contraceptive: Secondary | ICD-10-CM

## 2020-10-05 MED ORDER — MEDROXYPROGESTERONE ACETATE 150 MG/ML IM SUSP
150.0000 mg | Freq: Once | INTRAMUSCULAR | Status: AC
  Administered 2020-10-05: 150 mg via INTRAMUSCULAR

## 2020-10-05 NOTE — Progress Notes (Signed)
Patient presents today for Depo Provera injection within dates. Given IM Left Deltoid per patient request. Patient tolerated well. 

## 2020-10-10 ENCOUNTER — Other Ambulatory Visit: Payer: Managed Care, Other (non HMO)

## 2020-10-11 ENCOUNTER — Telehealth: Payer: Managed Care, Other (non HMO) | Admitting: Oncology

## 2020-10-16 ENCOUNTER — Other Ambulatory Visit: Payer: Self-pay

## 2020-10-16 DIAGNOSIS — E119 Type 2 diabetes mellitus without complications: Secondary | ICD-10-CM

## 2020-10-16 MED ORDER — CONTOUR NEXT ONE KIT: 1.0000 | PACK | 0 refills | Status: AC

## 2020-11-27 ENCOUNTER — Telehealth: Payer: Self-pay | Admitting: Internal Medicine

## 2020-11-27 NOTE — Telephone Encounter (Signed)
Called pt to confirm an appt she stated that she has moved to [facility]  Dr. Lorin Picket taken out as PCP

## 2020-11-30 ENCOUNTER — Ambulatory Visit: Payer: Managed Care, Other (non HMO) | Admitting: Internal Medicine

## 2020-12-25 ENCOUNTER — Other Ambulatory Visit: Payer: Self-pay | Admitting: Internal Medicine

## 2021-06-18 IMAGING — MR MR HEAD WO/W CM
14 series · 48 of 48 positions shown · IV contrast (gadavist)
Comparison: Head CT 11/10/2069 and MRI 12/09/2011

CLINICAL DATA: Frequent headaches, right neck pain, and
hypertension.

EXAM:
MRI HEAD WITHOUT AND WITH CONTRAST
TECHNIQUE: Multiplanar, multiecho pulse sequences of the brain and surrounding
structures were obtained without and with intravenous contrast.
CONTRAST:  10mL GADAVIST GADOBUTROL 1 MMOL/ML IV SOLN

[Series 5: ax dwi_tracew · axial · 3.0mm · 0.60mm/px · z∈[-160,-8]mm · 3 of 48 slices shown]
[im 1/48]
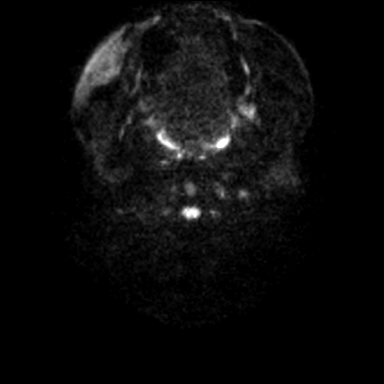
[im 24/48]
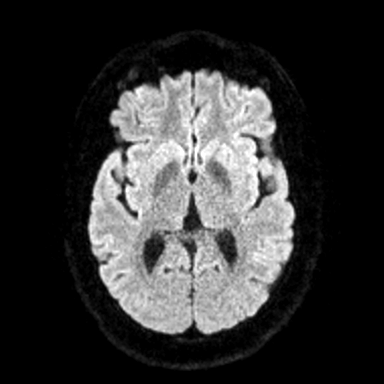
[im 48/48]
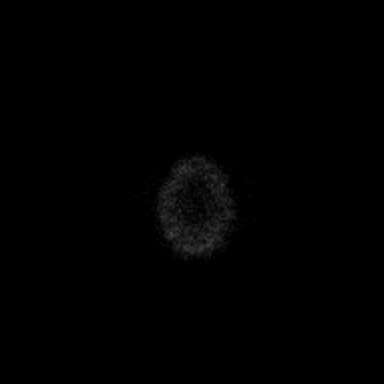

[Series 6: ax dwi_adc · axial · 3.0mm · 0.60mm/px · z∈[-160,-8]mm · 3 of 48 slices shown]
[im 1/48]
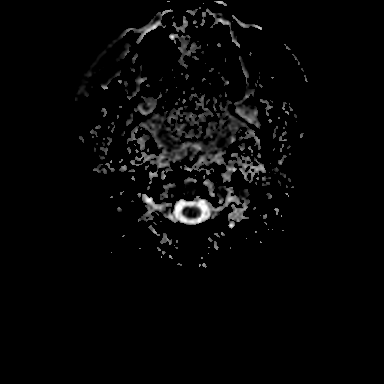
[im 24/48]
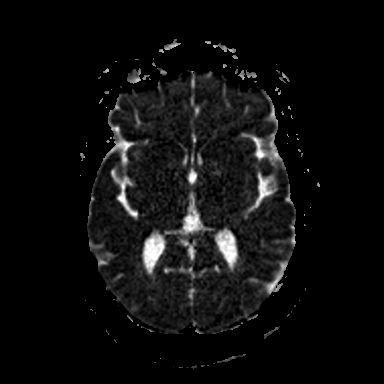
[im 48/48]
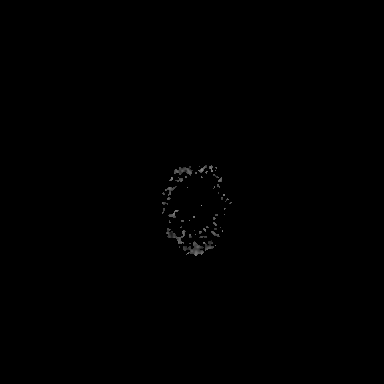

[Series 7: cor dwi_tracew · coronal · 5.0mm · 0.60mm/px · 2 of 36 slices shown]
[im 1/36]
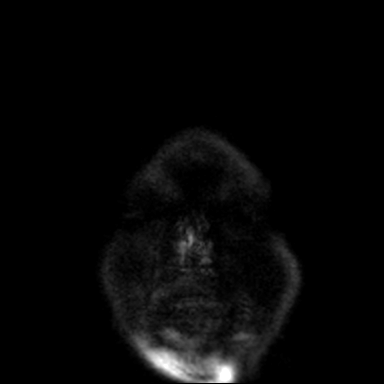
[im 36/36]
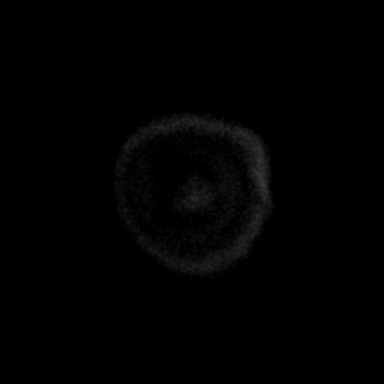

[Series 8: cor dwi_adc · coronal · 5.0mm · 0.60mm/px · 2 of 36 slices shown]
[im 1/36]
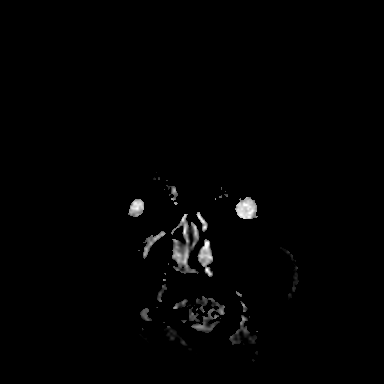
[im 36/36]
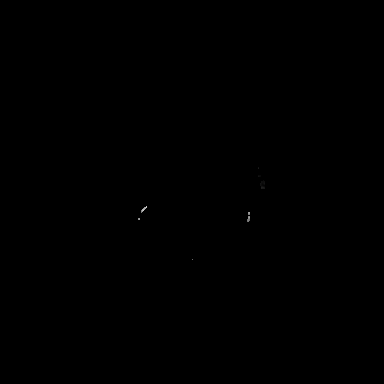

[Series 9: T1 · sagittal · 5.0mm · 0.62mm/px · 1 of 21 slices shown (1 of 2)]
[im 1/21]
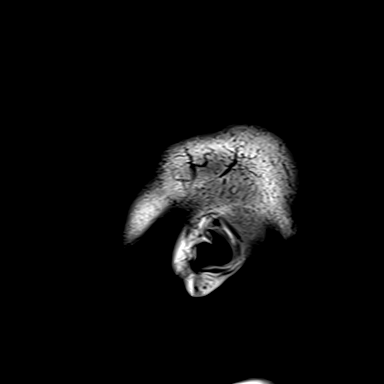

[Series 10: T2 · axial · 5.0mm · 0.53mm/px · 1 of 25 slices shown]
[im 1/25]
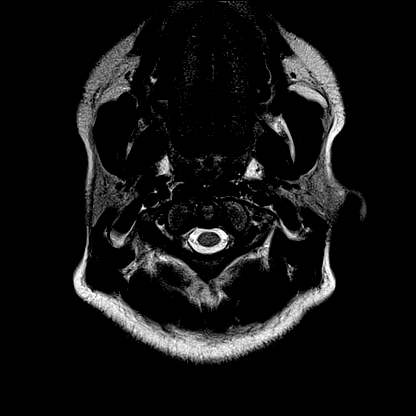

[Series 11: mag_images · axial · 3.0mm · 0.90mm/px · z∈[-173,+0]mm · 4 of 60 slices shown]
[im 1/60]
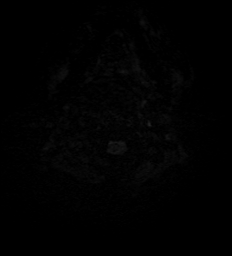
[im 20/60]
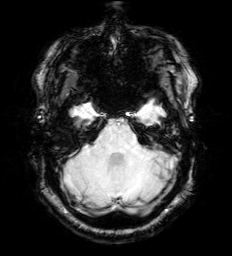
[im 40/60]
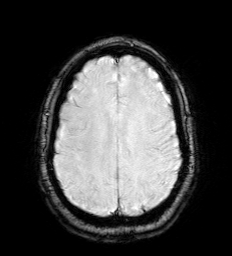
[im 60/60]
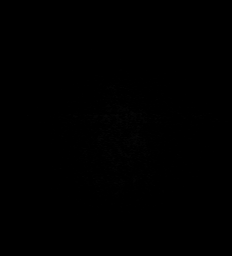

[Series 12: pha_images · axial · 3.0mm · 0.90mm/px · z∈[-173,+0]mm · 3 of 59 slices shown]
[im 1/59]
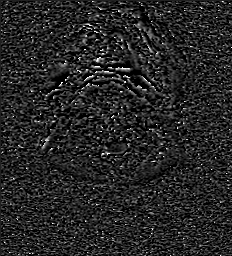
[im 30/59]
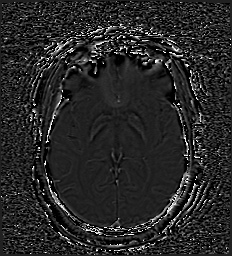
[im 59/59]
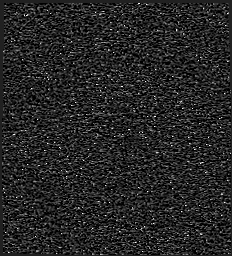

[Series 13: swi_images · axial · 3.0mm · 0.90mm/px · z∈[-173,+0]mm · 4 of 60 slices shown]
[im 1/60]
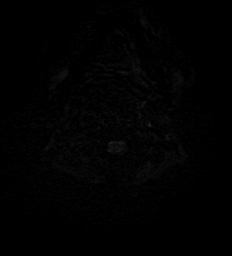
[im 20/60]
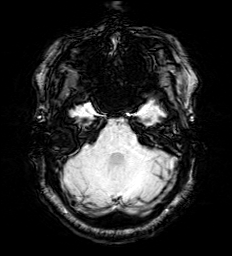
[im 40/60]
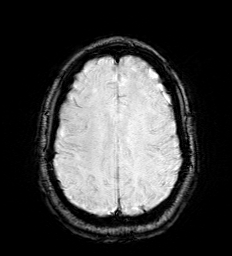
[im 60/60]
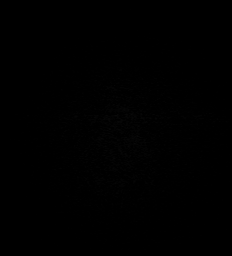

[Series 15: FLAIR · axial · 3.0mm · 0.53mm/px · z∈[-166,-7]mm · 3 of 55 slices shown]
[im 1/55]
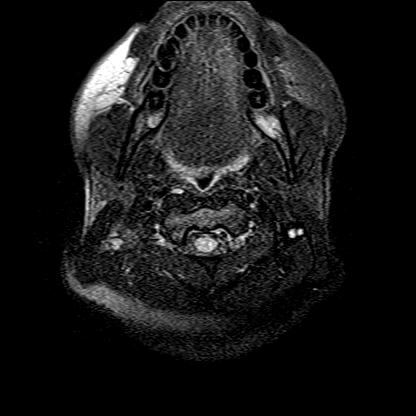
[im 28/55]
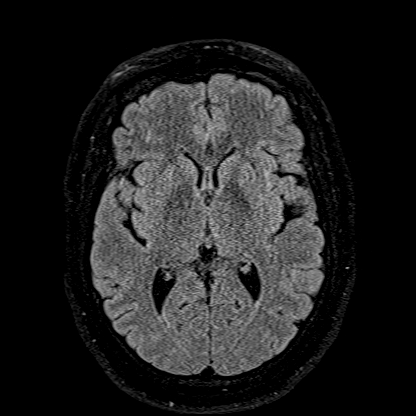
[im 55/55]
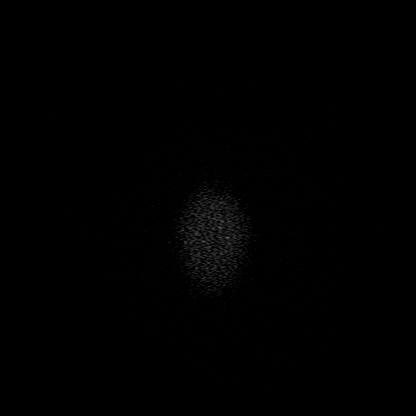

[Series 16: T1 · axial · 1.0mm · 0.98mm/px · z∈[-167,-11]mm · 9 of 160 slices shown (2 of 2)]
[im 1/160]
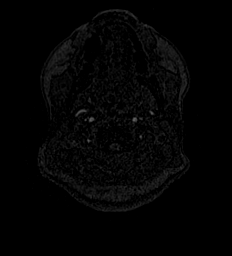
[im 20/160]
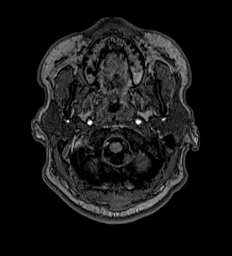
[im 40/160]
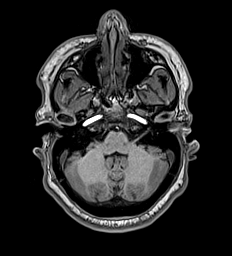
[im 60/160]
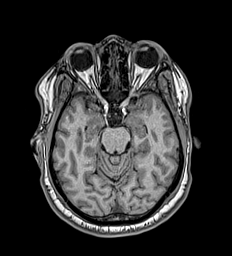
[im 80/160]
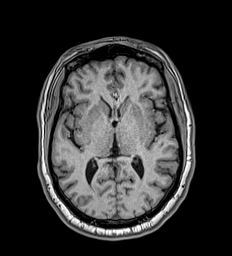
[im 100/160]
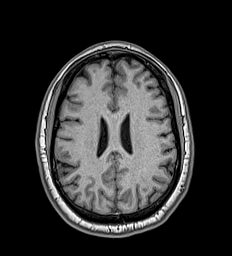
[im 120/160]
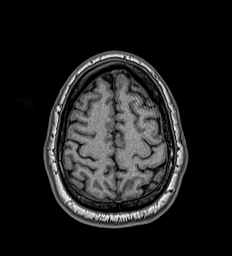
[im 140/160]
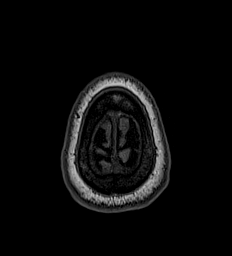
[im 160/160]
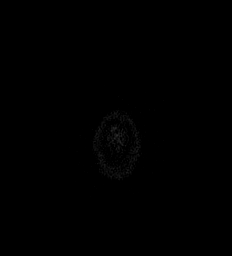

[Series 17: T2 post-contrast · coronal · 5.0mm · 0.57mm/px · 2 of 27 slices shown]
[im 1/27]
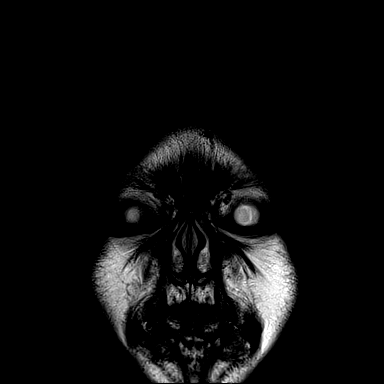
[im 27/27]
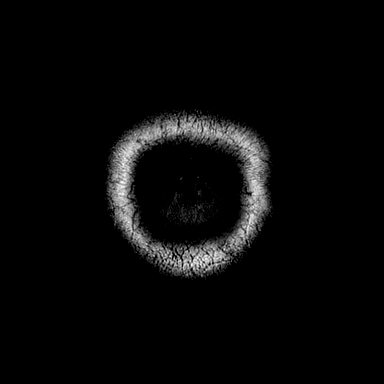

[Series 18: T1 post-contrast · axial · 1.0mm · 0.98mm/px · z∈[-167,-11]mm · 9 of 160 slices shown (1 of 2)]
[im 1/160]
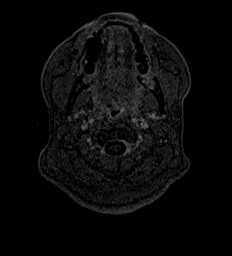
[im 20/160]
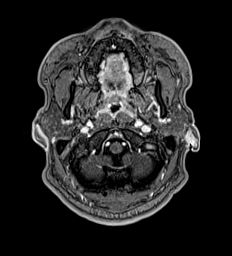
[im 40/160]
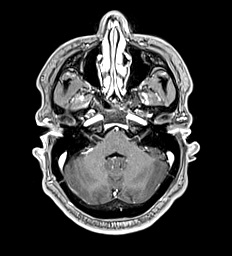
[im 60/160]
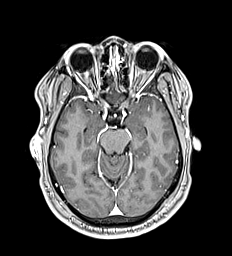
[im 80/160]
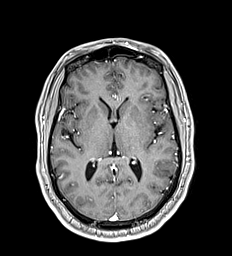
[im 100/160]
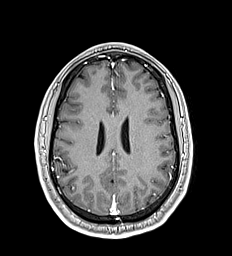
[im 120/160]
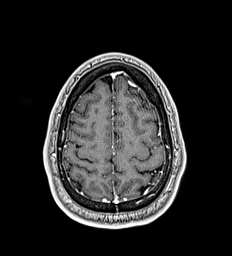
[im 140/160]
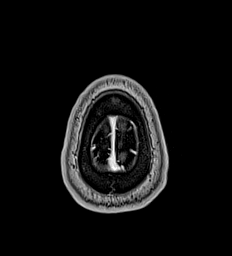
[im 160/160]
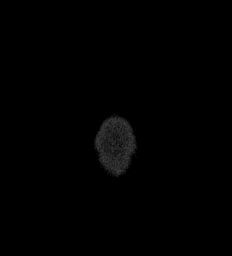

[Series 19: T1 post-contrast · coronal · 5.0mm · 0.57mm/px · 2 of 27 slices shown (2 of 2)]
[im 1/27]
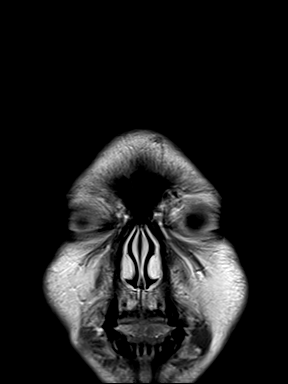
[im 27/27]
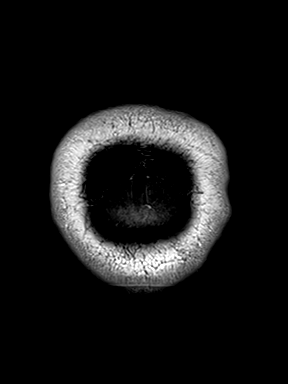

[48 of 48 positions shown; findings below may reference images not displayed]

FINDINGS: Brain: There is no evidence of acute infarct, intracranial
hemorrhage, mass, midline shift, or extra-axial fluid collection.
The ventricles and sulci are normal. The brain is normal in signal.

Vascular: Major intracranial vascular flow voids are preserved.

Skull and upper cervical spine: Unremarkable bone marrow signal.

Sinuses/Orbits: Unremarkable orbits. Paranasal sinuses and mastoid
air cells are clear.

Other: None.
IMPRESSION: Negative brain MRI.
